# Patient Record
Sex: Male | Born: 1935 | Race: White | Hispanic: No | Smoking: Former smoker
Health system: Southern US, Community
[De-identification: ages and names within clinical notes are randomized; demographics above are authoritative.]

## PROBLEM LIST (undated history)

## (undated) DIAGNOSIS — M545 Low back pain, unspecified: Secondary | ICD-10-CM

## (undated) DIAGNOSIS — G308 Other Alzheimer's disease: Secondary | ICD-10-CM

## (undated) DIAGNOSIS — F339 Major depressive disorder, recurrent, unspecified: Secondary | ICD-10-CM

## (undated) DIAGNOSIS — I482 Chronic atrial fibrillation, unspecified: Secondary | ICD-10-CM

## (undated) DIAGNOSIS — C61 Malignant neoplasm of prostate: Secondary | ICD-10-CM

## (undated) DIAGNOSIS — F0280 Dementia in other diseases classified elsewhere without behavioral disturbance: Secondary | ICD-10-CM

## (undated) DIAGNOSIS — I639 Cerebral infarction, unspecified: Secondary | ICD-10-CM

## (undated) DIAGNOSIS — I10 Essential (primary) hypertension: Secondary | ICD-10-CM

## (undated) DIAGNOSIS — I2510 Atherosclerotic heart disease of native coronary artery without angina pectoris: Secondary | ICD-10-CM

## (undated) HISTORY — DX: Low back pain: M54.5

## (undated) HISTORY — PX: CATARACT EXTRACTION: SUR2

## (undated) HISTORY — PX: PROSTATECTOMY: SHX69

## (undated) HISTORY — PX: CORONARY ARTERY BYPASS GRAFT: SHX141

## (undated) HISTORY — DX: Low back pain, unspecified: M54.50

---

## 2011-07-15 DIAGNOSIS — M5137 Other intervertebral disc degeneration, lumbosacral region: Secondary | ICD-10-CM | POA: Diagnosis not present

## 2011-07-15 DIAGNOSIS — M999 Biomechanical lesion, unspecified: Secondary | ICD-10-CM | POA: Diagnosis not present

## 2011-07-15 DIAGNOSIS — M549 Dorsalgia, unspecified: Secondary | ICD-10-CM | POA: Diagnosis not present

## 2011-07-19 DIAGNOSIS — R071 Chest pain on breathing: Secondary | ICD-10-CM | POA: Diagnosis not present

## 2011-07-19 DIAGNOSIS — M25519 Pain in unspecified shoulder: Secondary | ICD-10-CM | POA: Diagnosis not present

## 2011-07-19 DIAGNOSIS — J310 Chronic rhinitis: Secondary | ICD-10-CM | POA: Diagnosis not present

## 2011-07-19 DIAGNOSIS — R0982 Postnasal drip: Secondary | ICD-10-CM | POA: Diagnosis not present

## 2011-07-19 DIAGNOSIS — Z7901 Long term (current) use of anticoagulants: Secondary | ICD-10-CM | POA: Diagnosis not present

## 2011-07-19 DIAGNOSIS — R197 Diarrhea, unspecified: Secondary | ICD-10-CM | POA: Diagnosis not present

## 2011-07-19 DIAGNOSIS — I4891 Unspecified atrial fibrillation: Secondary | ICD-10-CM | POA: Diagnosis not present

## 2011-07-19 DIAGNOSIS — J449 Chronic obstructive pulmonary disease, unspecified: Secondary | ICD-10-CM | POA: Diagnosis not present

## 2011-07-29 DIAGNOSIS — M5137 Other intervertebral disc degeneration, lumbosacral region: Secondary | ICD-10-CM | POA: Diagnosis not present

## 2011-07-29 DIAGNOSIS — M549 Dorsalgia, unspecified: Secondary | ICD-10-CM | POA: Diagnosis not present

## 2011-07-29 DIAGNOSIS — M999 Biomechanical lesion, unspecified: Secondary | ICD-10-CM | POA: Diagnosis not present

## 2011-08-03 DIAGNOSIS — M549 Dorsalgia, unspecified: Secondary | ICD-10-CM | POA: Diagnosis not present

## 2011-08-03 DIAGNOSIS — M999 Biomechanical lesion, unspecified: Secondary | ICD-10-CM | POA: Diagnosis not present

## 2011-08-03 DIAGNOSIS — M5137 Other intervertebral disc degeneration, lumbosacral region: Secondary | ICD-10-CM | POA: Diagnosis not present

## 2011-08-17 DIAGNOSIS — M5137 Other intervertebral disc degeneration, lumbosacral region: Secondary | ICD-10-CM | POA: Diagnosis not present

## 2011-08-17 DIAGNOSIS — M549 Dorsalgia, unspecified: Secondary | ICD-10-CM | POA: Diagnosis not present

## 2011-08-17 DIAGNOSIS — M999 Biomechanical lesion, unspecified: Secondary | ICD-10-CM | POA: Diagnosis not present

## 2011-08-24 DIAGNOSIS — Z7901 Long term (current) use of anticoagulants: Secondary | ICD-10-CM | POA: Diagnosis not present

## 2011-08-24 DIAGNOSIS — I472 Ventricular tachycardia: Secondary | ICD-10-CM | POA: Diagnosis not present

## 2011-08-24 DIAGNOSIS — F0280 Dementia in other diseases classified elsewhere without behavioral disturbance: Secondary | ICD-10-CM | POA: Diagnosis not present

## 2011-08-24 DIAGNOSIS — Z5181 Encounter for therapeutic drug level monitoring: Secondary | ICD-10-CM | POA: Diagnosis not present

## 2011-08-26 DIAGNOSIS — M999 Biomechanical lesion, unspecified: Secondary | ICD-10-CM | POA: Diagnosis not present

## 2011-08-26 DIAGNOSIS — M549 Dorsalgia, unspecified: Secondary | ICD-10-CM | POA: Diagnosis not present

## 2011-08-26 DIAGNOSIS — M5137 Other intervertebral disc degeneration, lumbosacral region: Secondary | ICD-10-CM | POA: Diagnosis not present

## 2011-09-02 DIAGNOSIS — M549 Dorsalgia, unspecified: Secondary | ICD-10-CM | POA: Diagnosis not present

## 2011-09-02 DIAGNOSIS — M5137 Other intervertebral disc degeneration, lumbosacral region: Secondary | ICD-10-CM | POA: Diagnosis not present

## 2011-09-02 DIAGNOSIS — M999 Biomechanical lesion, unspecified: Secondary | ICD-10-CM | POA: Diagnosis not present

## 2011-09-16 DIAGNOSIS — M549 Dorsalgia, unspecified: Secondary | ICD-10-CM | POA: Diagnosis not present

## 2011-09-16 DIAGNOSIS — M999 Biomechanical lesion, unspecified: Secondary | ICD-10-CM | POA: Diagnosis not present

## 2011-09-16 DIAGNOSIS — M5137 Other intervertebral disc degeneration, lumbosacral region: Secondary | ICD-10-CM | POA: Diagnosis not present

## 2011-09-25 DIAGNOSIS — I4891 Unspecified atrial fibrillation: Secondary | ICD-10-CM | POA: Diagnosis not present

## 2011-09-25 DIAGNOSIS — R0982 Postnasal drip: Secondary | ICD-10-CM | POA: Diagnosis not present

## 2011-09-25 DIAGNOSIS — J310 Chronic rhinitis: Secondary | ICD-10-CM | POA: Diagnosis not present

## 2011-09-25 DIAGNOSIS — F0280 Dementia in other diseases classified elsewhere without behavioral disturbance: Secondary | ICD-10-CM | POA: Diagnosis not present

## 2011-09-30 DIAGNOSIS — M5137 Other intervertebral disc degeneration, lumbosacral region: Secondary | ICD-10-CM | POA: Diagnosis not present

## 2011-09-30 DIAGNOSIS — M999 Biomechanical lesion, unspecified: Secondary | ICD-10-CM | POA: Diagnosis not present

## 2011-09-30 DIAGNOSIS — M549 Dorsalgia, unspecified: Secondary | ICD-10-CM | POA: Diagnosis not present

## 2011-10-07 DIAGNOSIS — M5137 Other intervertebral disc degeneration, lumbosacral region: Secondary | ICD-10-CM | POA: Diagnosis not present

## 2011-10-07 DIAGNOSIS — M549 Dorsalgia, unspecified: Secondary | ICD-10-CM | POA: Diagnosis not present

## 2011-10-07 DIAGNOSIS — M999 Biomechanical lesion, unspecified: Secondary | ICD-10-CM | POA: Diagnosis not present

## 2011-10-24 DIAGNOSIS — H348392 Tributary (branch) retinal vein occlusion, unspecified eye, stable: Secondary | ICD-10-CM | POA: Diagnosis not present

## 2011-10-26 DIAGNOSIS — H35039 Hypertensive retinopathy, unspecified eye: Secondary | ICD-10-CM | POA: Diagnosis not present

## 2011-10-26 DIAGNOSIS — H348392 Tributary (branch) retinal vein occlusion, unspecified eye, stable: Secondary | ICD-10-CM | POA: Diagnosis not present

## 2011-10-26 DIAGNOSIS — F0280 Dementia in other diseases classified elsewhere without behavioral disturbance: Secondary | ICD-10-CM | POA: Diagnosis not present

## 2011-10-26 DIAGNOSIS — I4891 Unspecified atrial fibrillation: Secondary | ICD-10-CM | POA: Diagnosis not present

## 2011-10-26 DIAGNOSIS — H3560 Retinal hemorrhage, unspecified eye: Secondary | ICD-10-CM | POA: Diagnosis not present

## 2011-11-15 DIAGNOSIS — I4891 Unspecified atrial fibrillation: Secondary | ICD-10-CM | POA: Diagnosis not present

## 2011-11-15 DIAGNOSIS — F0280 Dementia in other diseases classified elsewhere without behavioral disturbance: Secondary | ICD-10-CM | POA: Diagnosis not present

## 2011-11-15 DIAGNOSIS — G309 Alzheimer's disease, unspecified: Secondary | ICD-10-CM | POA: Diagnosis not present

## 2011-11-18 DIAGNOSIS — M5137 Other intervertebral disc degeneration, lumbosacral region: Secondary | ICD-10-CM | POA: Diagnosis not present

## 2011-11-18 DIAGNOSIS — M999 Biomechanical lesion, unspecified: Secondary | ICD-10-CM | POA: Diagnosis not present

## 2011-11-18 DIAGNOSIS — M549 Dorsalgia, unspecified: Secondary | ICD-10-CM | POA: Diagnosis not present

## 2011-11-25 DIAGNOSIS — M5137 Other intervertebral disc degeneration, lumbosacral region: Secondary | ICD-10-CM | POA: Diagnosis not present

## 2011-11-25 DIAGNOSIS — M549 Dorsalgia, unspecified: Secondary | ICD-10-CM | POA: Diagnosis not present

## 2011-11-25 DIAGNOSIS — M999 Biomechanical lesion, unspecified: Secondary | ICD-10-CM | POA: Diagnosis not present

## 2011-12-02 DIAGNOSIS — M549 Dorsalgia, unspecified: Secondary | ICD-10-CM | POA: Diagnosis not present

## 2011-12-02 DIAGNOSIS — M999 Biomechanical lesion, unspecified: Secondary | ICD-10-CM | POA: Diagnosis not present

## 2011-12-02 DIAGNOSIS — M5137 Other intervertebral disc degeneration, lumbosacral region: Secondary | ICD-10-CM | POA: Diagnosis not present

## 2011-12-09 DIAGNOSIS — M999 Biomechanical lesion, unspecified: Secondary | ICD-10-CM | POA: Diagnosis not present

## 2011-12-09 DIAGNOSIS — M549 Dorsalgia, unspecified: Secondary | ICD-10-CM | POA: Diagnosis not present

## 2011-12-09 DIAGNOSIS — M5137 Other intervertebral disc degeneration, lumbosacral region: Secondary | ICD-10-CM | POA: Diagnosis not present

## 2011-12-16 DIAGNOSIS — M999 Biomechanical lesion, unspecified: Secondary | ICD-10-CM | POA: Diagnosis not present

## 2011-12-16 DIAGNOSIS — M549 Dorsalgia, unspecified: Secondary | ICD-10-CM | POA: Diagnosis not present

## 2011-12-16 DIAGNOSIS — M5137 Other intervertebral disc degeneration, lumbosacral region: Secondary | ICD-10-CM | POA: Diagnosis not present

## 2011-12-23 DIAGNOSIS — M999 Biomechanical lesion, unspecified: Secondary | ICD-10-CM | POA: Diagnosis not present

## 2011-12-23 DIAGNOSIS — M5137 Other intervertebral disc degeneration, lumbosacral region: Secondary | ICD-10-CM | POA: Diagnosis not present

## 2011-12-23 DIAGNOSIS — M549 Dorsalgia, unspecified: Secondary | ICD-10-CM | POA: Diagnosis not present

## 2011-12-28 DIAGNOSIS — H3560 Retinal hemorrhage, unspecified eye: Secondary | ICD-10-CM | POA: Diagnosis not present

## 2011-12-28 DIAGNOSIS — H348392 Tributary (branch) retinal vein occlusion, unspecified eye, stable: Secondary | ICD-10-CM | POA: Diagnosis not present

## 2011-12-28 DIAGNOSIS — I4891 Unspecified atrial fibrillation: Secondary | ICD-10-CM | POA: Diagnosis not present

## 2011-12-28 DIAGNOSIS — F0280 Dementia in other diseases classified elsewhere without behavioral disturbance: Secondary | ICD-10-CM | POA: Diagnosis not present

## 2011-12-28 DIAGNOSIS — H35039 Hypertensive retinopathy, unspecified eye: Secondary | ICD-10-CM | POA: Diagnosis not present

## 2012-01-06 DIAGNOSIS — M5137 Other intervertebral disc degeneration, lumbosacral region: Secondary | ICD-10-CM | POA: Diagnosis not present

## 2012-01-06 DIAGNOSIS — M999 Biomechanical lesion, unspecified: Secondary | ICD-10-CM | POA: Diagnosis not present

## 2012-01-06 DIAGNOSIS — M549 Dorsalgia, unspecified: Secondary | ICD-10-CM | POA: Diagnosis not present

## 2012-01-16 DIAGNOSIS — M1610 Unilateral primary osteoarthritis, unspecified hip: Secondary | ICD-10-CM | POA: Diagnosis not present

## 2012-01-16 DIAGNOSIS — IMO0001 Reserved for inherently not codable concepts without codable children: Secondary | ICD-10-CM | POA: Diagnosis not present

## 2012-01-16 DIAGNOSIS — R5381 Other malaise: Secondary | ICD-10-CM | POA: Diagnosis not present

## 2012-01-16 DIAGNOSIS — N289 Disorder of kidney and ureter, unspecified: Secondary | ICD-10-CM | POA: Diagnosis not present

## 2012-01-27 DIAGNOSIS — M999 Biomechanical lesion, unspecified: Secondary | ICD-10-CM | POA: Diagnosis not present

## 2012-01-27 DIAGNOSIS — M549 Dorsalgia, unspecified: Secondary | ICD-10-CM | POA: Diagnosis not present

## 2012-01-27 DIAGNOSIS — M5137 Other intervertebral disc degeneration, lumbosacral region: Secondary | ICD-10-CM | POA: Diagnosis not present

## 2012-01-29 DIAGNOSIS — R5381 Other malaise: Secondary | ICD-10-CM | POA: Diagnosis not present

## 2012-01-29 DIAGNOSIS — N289 Disorder of kidney and ureter, unspecified: Secondary | ICD-10-CM | POA: Diagnosis not present

## 2012-01-29 DIAGNOSIS — M1610 Unilateral primary osteoarthritis, unspecified hip: Secondary | ICD-10-CM | POA: Diagnosis not present

## 2012-01-29 DIAGNOSIS — IMO0001 Reserved for inherently not codable concepts without codable children: Secondary | ICD-10-CM | POA: Diagnosis not present

## 2012-02-02 DIAGNOSIS — I4891 Unspecified atrial fibrillation: Secondary | ICD-10-CM | POA: Diagnosis not present

## 2012-02-02 DIAGNOSIS — F0280 Dementia in other diseases classified elsewhere without behavioral disturbance: Secondary | ICD-10-CM | POA: Diagnosis not present

## 2012-02-03 DIAGNOSIS — M999 Biomechanical lesion, unspecified: Secondary | ICD-10-CM | POA: Diagnosis not present

## 2012-02-03 DIAGNOSIS — M549 Dorsalgia, unspecified: Secondary | ICD-10-CM | POA: Diagnosis not present

## 2012-02-03 DIAGNOSIS — M5137 Other intervertebral disc degeneration, lumbosacral region: Secondary | ICD-10-CM | POA: Diagnosis not present

## 2012-02-06 DIAGNOSIS — M1610 Unilateral primary osteoarthritis, unspecified hip: Secondary | ICD-10-CM | POA: Diagnosis not present

## 2012-02-06 DIAGNOSIS — N289 Disorder of kidney and ureter, unspecified: Secondary | ICD-10-CM | POA: Diagnosis not present

## 2012-02-06 DIAGNOSIS — R5381 Other malaise: Secondary | ICD-10-CM | POA: Diagnosis not present

## 2012-02-06 DIAGNOSIS — IMO0001 Reserved for inherently not codable concepts without codable children: Secondary | ICD-10-CM | POA: Diagnosis not present

## 2012-02-16 DIAGNOSIS — J209 Acute bronchitis, unspecified: Secondary | ICD-10-CM | POA: Diagnosis not present

## 2012-02-16 DIAGNOSIS — F0390 Unspecified dementia without behavioral disturbance: Secondary | ICD-10-CM | POA: Diagnosis not present

## 2012-02-16 DIAGNOSIS — J310 Chronic rhinitis: Secondary | ICD-10-CM | POA: Diagnosis not present

## 2012-02-16 DIAGNOSIS — R05 Cough: Secondary | ICD-10-CM | POA: Diagnosis not present

## 2012-02-17 DIAGNOSIS — M549 Dorsalgia, unspecified: Secondary | ICD-10-CM | POA: Diagnosis not present

## 2012-02-17 DIAGNOSIS — M5137 Other intervertebral disc degeneration, lumbosacral region: Secondary | ICD-10-CM | POA: Diagnosis not present

## 2012-02-17 DIAGNOSIS — M999 Biomechanical lesion, unspecified: Secondary | ICD-10-CM | POA: Diagnosis not present

## 2012-02-19 DIAGNOSIS — M1610 Unilateral primary osteoarthritis, unspecified hip: Secondary | ICD-10-CM | POA: Diagnosis not present

## 2012-02-19 DIAGNOSIS — R5381 Other malaise: Secondary | ICD-10-CM | POA: Diagnosis not present

## 2012-02-19 DIAGNOSIS — N289 Disorder of kidney and ureter, unspecified: Secondary | ICD-10-CM | POA: Diagnosis not present

## 2012-02-19 DIAGNOSIS — R5383 Other fatigue: Secondary | ICD-10-CM | POA: Diagnosis not present

## 2012-02-19 DIAGNOSIS — IMO0001 Reserved for inherently not codable concepts without codable children: Secondary | ICD-10-CM | POA: Diagnosis not present

## 2012-02-24 DIAGNOSIS — M999 Biomechanical lesion, unspecified: Secondary | ICD-10-CM | POA: Diagnosis not present

## 2012-02-24 DIAGNOSIS — M5137 Other intervertebral disc degeneration, lumbosacral region: Secondary | ICD-10-CM | POA: Diagnosis not present

## 2012-02-24 DIAGNOSIS — M549 Dorsalgia, unspecified: Secondary | ICD-10-CM | POA: Diagnosis not present

## 2012-02-26 DIAGNOSIS — R5383 Other fatigue: Secondary | ICD-10-CM | POA: Diagnosis not present

## 2012-02-26 DIAGNOSIS — IMO0001 Reserved for inherently not codable concepts without codable children: Secondary | ICD-10-CM | POA: Diagnosis not present

## 2012-02-26 DIAGNOSIS — M1610 Unilateral primary osteoarthritis, unspecified hip: Secondary | ICD-10-CM | POA: Diagnosis not present

## 2012-02-26 DIAGNOSIS — N289 Disorder of kidney and ureter, unspecified: Secondary | ICD-10-CM | POA: Diagnosis not present

## 2012-02-28 DIAGNOSIS — R142 Eructation: Secondary | ICD-10-CM | POA: Diagnosis not present

## 2012-02-28 DIAGNOSIS — R0982 Postnasal drip: Secondary | ICD-10-CM | POA: Diagnosis not present

## 2012-02-28 DIAGNOSIS — R141 Gas pain: Secondary | ICD-10-CM | POA: Diagnosis not present

## 2012-02-28 DIAGNOSIS — Z7901 Long term (current) use of anticoagulants: Secondary | ICD-10-CM | POA: Diagnosis not present

## 2012-03-04 DIAGNOSIS — R5381 Other malaise: Secondary | ICD-10-CM | POA: Diagnosis not present

## 2012-03-04 DIAGNOSIS — IMO0001 Reserved for inherently not codable concepts without codable children: Secondary | ICD-10-CM | POA: Diagnosis not present

## 2012-03-04 DIAGNOSIS — R5383 Other fatigue: Secondary | ICD-10-CM | POA: Diagnosis not present

## 2012-03-04 DIAGNOSIS — N289 Disorder of kidney and ureter, unspecified: Secondary | ICD-10-CM | POA: Diagnosis not present

## 2012-03-04 DIAGNOSIS — M1610 Unilateral primary osteoarthritis, unspecified hip: Secondary | ICD-10-CM | POA: Diagnosis not present

## 2012-03-07 DIAGNOSIS — I4891 Unspecified atrial fibrillation: Secondary | ICD-10-CM | POA: Diagnosis not present

## 2012-03-07 DIAGNOSIS — F0280 Dementia in other diseases classified elsewhere without behavioral disturbance: Secondary | ICD-10-CM | POA: Diagnosis not present

## 2012-03-09 DIAGNOSIS — M549 Dorsalgia, unspecified: Secondary | ICD-10-CM | POA: Diagnosis not present

## 2012-03-09 DIAGNOSIS — M999 Biomechanical lesion, unspecified: Secondary | ICD-10-CM | POA: Diagnosis not present

## 2012-03-09 DIAGNOSIS — M5137 Other intervertebral disc degeneration, lumbosacral region: Secondary | ICD-10-CM | POA: Diagnosis not present

## 2012-04-03 DIAGNOSIS — Z23 Encounter for immunization: Secondary | ICD-10-CM | POA: Diagnosis not present

## 2012-04-03 DIAGNOSIS — F0390 Unspecified dementia without behavioral disturbance: Secondary | ICD-10-CM | POA: Diagnosis not present

## 2012-04-03 DIAGNOSIS — F0280 Dementia in other diseases classified elsewhere without behavioral disturbance: Secondary | ICD-10-CM | POA: Diagnosis not present

## 2012-04-03 DIAGNOSIS — G309 Alzheimer's disease, unspecified: Secondary | ICD-10-CM | POA: Diagnosis not present

## 2012-04-17 DIAGNOSIS — F0390 Unspecified dementia without behavioral disturbance: Secondary | ICD-10-CM | POA: Diagnosis not present

## 2012-04-17 DIAGNOSIS — M545 Low back pain: Secondary | ICD-10-CM | POA: Diagnosis not present

## 2012-04-17 DIAGNOSIS — R0982 Postnasal drip: Secondary | ICD-10-CM | POA: Diagnosis not present

## 2012-04-23 DIAGNOSIS — I4891 Unspecified atrial fibrillation: Secondary | ICD-10-CM | POA: Diagnosis not present

## 2012-04-23 DIAGNOSIS — F0280 Dementia in other diseases classified elsewhere without behavioral disturbance: Secondary | ICD-10-CM | POA: Diagnosis not present

## 2012-05-01 DIAGNOSIS — K219 Gastro-esophageal reflux disease without esophagitis: Secondary | ICD-10-CM | POA: Diagnosis not present

## 2012-05-01 DIAGNOSIS — M545 Low back pain: Secondary | ICD-10-CM | POA: Diagnosis not present

## 2012-05-01 DIAGNOSIS — Z7901 Long term (current) use of anticoagulants: Secondary | ICD-10-CM | POA: Diagnosis not present

## 2012-05-01 DIAGNOSIS — R0982 Postnasal drip: Secondary | ICD-10-CM | POA: Diagnosis not present

## 2012-05-01 DIAGNOSIS — J310 Chronic rhinitis: Secondary | ICD-10-CM | POA: Diagnosis not present

## 2012-05-01 DIAGNOSIS — F0390 Unspecified dementia without behavioral disturbance: Secondary | ICD-10-CM | POA: Diagnosis not present

## 2012-05-15 DIAGNOSIS — R0982 Postnasal drip: Secondary | ICD-10-CM | POA: Diagnosis not present

## 2012-05-15 DIAGNOSIS — J310 Chronic rhinitis: Secondary | ICD-10-CM | POA: Diagnosis not present

## 2012-05-15 DIAGNOSIS — F0280 Dementia in other diseases classified elsewhere without behavioral disturbance: Secondary | ICD-10-CM | POA: Diagnosis not present

## 2012-05-15 DIAGNOSIS — J709 Respiratory conditions due to unspecified external agent: Secondary | ICD-10-CM | POA: Diagnosis not present

## 2012-05-15 DIAGNOSIS — F0390 Unspecified dementia without behavioral disturbance: Secondary | ICD-10-CM | POA: Diagnosis not present

## 2012-05-15 DIAGNOSIS — G309 Alzheimer's disease, unspecified: Secondary | ICD-10-CM | POA: Diagnosis not present

## 2012-05-17 DIAGNOSIS — M5137 Other intervertebral disc degeneration, lumbosacral region: Secondary | ICD-10-CM | POA: Diagnosis not present

## 2012-05-17 DIAGNOSIS — M549 Dorsalgia, unspecified: Secondary | ICD-10-CM | POA: Diagnosis not present

## 2012-05-17 DIAGNOSIS — M999 Biomechanical lesion, unspecified: Secondary | ICD-10-CM | POA: Diagnosis not present

## 2012-05-30 DIAGNOSIS — I4891 Unspecified atrial fibrillation: Secondary | ICD-10-CM | POA: Diagnosis not present

## 2012-05-30 DIAGNOSIS — F0280 Dementia in other diseases classified elsewhere without behavioral disturbance: Secondary | ICD-10-CM | POA: Diagnosis not present

## 2012-06-05 DIAGNOSIS — J310 Chronic rhinitis: Secondary | ICD-10-CM | POA: Diagnosis not present

## 2012-06-05 DIAGNOSIS — J709 Respiratory conditions due to unspecified external agent: Secondary | ICD-10-CM | POA: Diagnosis not present

## 2012-06-05 DIAGNOSIS — G309 Alzheimer's disease, unspecified: Secondary | ICD-10-CM | POA: Diagnosis not present

## 2012-06-05 DIAGNOSIS — F0280 Dementia in other diseases classified elsewhere without behavioral disturbance: Secondary | ICD-10-CM | POA: Diagnosis not present

## 2012-06-05 DIAGNOSIS — I10 Essential (primary) hypertension: Secondary | ICD-10-CM | POA: Diagnosis not present

## 2012-06-05 DIAGNOSIS — R0982 Postnasal drip: Secondary | ICD-10-CM | POA: Diagnosis not present

## 2012-06-17 DIAGNOSIS — F0390 Unspecified dementia without behavioral disturbance: Secondary | ICD-10-CM | POA: Diagnosis not present

## 2012-06-17 DIAGNOSIS — F411 Generalized anxiety disorder: Secondary | ICD-10-CM | POA: Diagnosis not present

## 2012-06-17 DIAGNOSIS — J310 Chronic rhinitis: Secondary | ICD-10-CM | POA: Diagnosis not present

## 2012-06-17 DIAGNOSIS — G309 Alzheimer's disease, unspecified: Secondary | ICD-10-CM | POA: Diagnosis not present

## 2012-06-17 DIAGNOSIS — F0280 Dementia in other diseases classified elsewhere without behavioral disturbance: Secondary | ICD-10-CM | POA: Diagnosis not present

## 2012-06-17 DIAGNOSIS — R0982 Postnasal drip: Secondary | ICD-10-CM | POA: Diagnosis not present

## 2012-06-24 DIAGNOSIS — IMO0002 Reserved for concepts with insufficient information to code with codable children: Secondary | ICD-10-CM | POA: Diagnosis not present

## 2012-06-24 DIAGNOSIS — I10 Essential (primary) hypertension: Secondary | ICD-10-CM | POA: Diagnosis not present

## 2012-06-26 DIAGNOSIS — Z111 Encounter for screening for respiratory tuberculosis: Secondary | ICD-10-CM | POA: Diagnosis not present

## 2012-07-09 DIAGNOSIS — I4891 Unspecified atrial fibrillation: Secondary | ICD-10-CM | POA: Diagnosis not present

## 2012-07-29 DIAGNOSIS — Z710 Person encountering health services to consult on behalf of another person: Secondary | ICD-10-CM | POA: Diagnosis not present

## 2012-09-02 DIAGNOSIS — Z7901 Long term (current) use of anticoagulants: Secondary | ICD-10-CM | POA: Diagnosis not present

## 2012-09-02 DIAGNOSIS — IMO0002 Reserved for concepts with insufficient information to code with codable children: Secondary | ICD-10-CM | POA: Diagnosis not present

## 2012-09-02 DIAGNOSIS — R197 Diarrhea, unspecified: Secondary | ICD-10-CM | POA: Diagnosis not present

## 2012-10-15 DIAGNOSIS — I4891 Unspecified atrial fibrillation: Secondary | ICD-10-CM | POA: Diagnosis not present

## 2012-10-30 DIAGNOSIS — R262 Difficulty in walking, not elsewhere classified: Secondary | ICD-10-CM | POA: Diagnosis not present

## 2012-10-30 DIAGNOSIS — M25569 Pain in unspecified knee: Secondary | ICD-10-CM | POA: Diagnosis not present

## 2012-10-30 DIAGNOSIS — R269 Unspecified abnormalities of gait and mobility: Secondary | ICD-10-CM | POA: Diagnosis not present

## 2012-11-01 DIAGNOSIS — R269 Unspecified abnormalities of gait and mobility: Secondary | ICD-10-CM | POA: Diagnosis not present

## 2012-11-01 DIAGNOSIS — R262 Difficulty in walking, not elsewhere classified: Secondary | ICD-10-CM | POA: Diagnosis not present

## 2012-11-01 DIAGNOSIS — M25569 Pain in unspecified knee: Secondary | ICD-10-CM | POA: Diagnosis not present

## 2012-11-05 DIAGNOSIS — R262 Difficulty in walking, not elsewhere classified: Secondary | ICD-10-CM | POA: Diagnosis not present

## 2012-11-05 DIAGNOSIS — R269 Unspecified abnormalities of gait and mobility: Secondary | ICD-10-CM | POA: Diagnosis not present

## 2012-11-05 DIAGNOSIS — M25569 Pain in unspecified knee: Secondary | ICD-10-CM | POA: Diagnosis not present

## 2012-11-06 DIAGNOSIS — R262 Difficulty in walking, not elsewhere classified: Secondary | ICD-10-CM | POA: Diagnosis not present

## 2012-11-06 DIAGNOSIS — R269 Unspecified abnormalities of gait and mobility: Secondary | ICD-10-CM | POA: Diagnosis not present

## 2012-11-06 DIAGNOSIS — M25569 Pain in unspecified knee: Secondary | ICD-10-CM | POA: Diagnosis not present

## 2012-11-12 DIAGNOSIS — R262 Difficulty in walking, not elsewhere classified: Secondary | ICD-10-CM | POA: Diagnosis not present

## 2012-11-12 DIAGNOSIS — M25569 Pain in unspecified knee: Secondary | ICD-10-CM | POA: Diagnosis not present

## 2012-11-12 DIAGNOSIS — R269 Unspecified abnormalities of gait and mobility: Secondary | ICD-10-CM | POA: Diagnosis not present

## 2012-11-13 DIAGNOSIS — R262 Difficulty in walking, not elsewhere classified: Secondary | ICD-10-CM | POA: Diagnosis not present

## 2012-11-13 DIAGNOSIS — R269 Unspecified abnormalities of gait and mobility: Secondary | ICD-10-CM | POA: Diagnosis not present

## 2012-11-13 DIAGNOSIS — M25569 Pain in unspecified knee: Secondary | ICD-10-CM | POA: Diagnosis not present

## 2012-11-18 DIAGNOSIS — R269 Unspecified abnormalities of gait and mobility: Secondary | ICD-10-CM | POA: Diagnosis not present

## 2012-11-18 DIAGNOSIS — M25569 Pain in unspecified knee: Secondary | ICD-10-CM | POA: Diagnosis not present

## 2012-11-18 DIAGNOSIS — R262 Difficulty in walking, not elsewhere classified: Secondary | ICD-10-CM | POA: Diagnosis not present

## 2012-11-20 DIAGNOSIS — M25569 Pain in unspecified knee: Secondary | ICD-10-CM | POA: Diagnosis not present

## 2012-11-20 DIAGNOSIS — R262 Difficulty in walking, not elsewhere classified: Secondary | ICD-10-CM | POA: Diagnosis not present

## 2012-11-20 DIAGNOSIS — R269 Unspecified abnormalities of gait and mobility: Secondary | ICD-10-CM | POA: Diagnosis not present

## 2012-11-25 DIAGNOSIS — R269 Unspecified abnormalities of gait and mobility: Secondary | ICD-10-CM | POA: Diagnosis not present

## 2012-11-25 DIAGNOSIS — R262 Difficulty in walking, not elsewhere classified: Secondary | ICD-10-CM | POA: Diagnosis not present

## 2012-11-25 DIAGNOSIS — M25569 Pain in unspecified knee: Secondary | ICD-10-CM | POA: Diagnosis not present

## 2012-11-27 DIAGNOSIS — M25569 Pain in unspecified knee: Secondary | ICD-10-CM | POA: Diagnosis not present

## 2012-11-27 DIAGNOSIS — R269 Unspecified abnormalities of gait and mobility: Secondary | ICD-10-CM | POA: Diagnosis not present

## 2012-11-27 DIAGNOSIS — R262 Difficulty in walking, not elsewhere classified: Secondary | ICD-10-CM | POA: Diagnosis not present

## 2012-11-29 DIAGNOSIS — I4891 Unspecified atrial fibrillation: Secondary | ICD-10-CM | POA: Diagnosis not present

## 2012-11-29 DIAGNOSIS — M25569 Pain in unspecified knee: Secondary | ICD-10-CM | POA: Diagnosis not present

## 2012-11-29 DIAGNOSIS — R269 Unspecified abnormalities of gait and mobility: Secondary | ICD-10-CM | POA: Diagnosis not present

## 2012-11-29 DIAGNOSIS — R262 Difficulty in walking, not elsewhere classified: Secondary | ICD-10-CM | POA: Diagnosis not present

## 2012-11-29 DIAGNOSIS — Z79899 Other long term (current) drug therapy: Secondary | ICD-10-CM | POA: Diagnosis not present

## 2012-11-29 DIAGNOSIS — L570 Actinic keratosis: Secondary | ICD-10-CM | POA: Diagnosis not present

## 2012-12-03 DIAGNOSIS — R269 Unspecified abnormalities of gait and mobility: Secondary | ICD-10-CM | POA: Diagnosis not present

## 2012-12-03 DIAGNOSIS — R262 Difficulty in walking, not elsewhere classified: Secondary | ICD-10-CM | POA: Diagnosis not present

## 2012-12-03 DIAGNOSIS — M25569 Pain in unspecified knee: Secondary | ICD-10-CM | POA: Diagnosis not present

## 2012-12-04 DIAGNOSIS — R269 Unspecified abnormalities of gait and mobility: Secondary | ICD-10-CM | POA: Diagnosis not present

## 2012-12-04 DIAGNOSIS — R262 Difficulty in walking, not elsewhere classified: Secondary | ICD-10-CM | POA: Diagnosis not present

## 2012-12-04 DIAGNOSIS — M25569 Pain in unspecified knee: Secondary | ICD-10-CM | POA: Diagnosis not present

## 2012-12-10 DIAGNOSIS — M25569 Pain in unspecified knee: Secondary | ICD-10-CM | POA: Diagnosis not present

## 2012-12-10 DIAGNOSIS — R269 Unspecified abnormalities of gait and mobility: Secondary | ICD-10-CM | POA: Diagnosis not present

## 2012-12-10 DIAGNOSIS — R262 Difficulty in walking, not elsewhere classified: Secondary | ICD-10-CM | POA: Diagnosis not present

## 2012-12-11 DIAGNOSIS — R262 Difficulty in walking, not elsewhere classified: Secondary | ICD-10-CM | POA: Diagnosis not present

## 2012-12-11 DIAGNOSIS — M25569 Pain in unspecified knee: Secondary | ICD-10-CM | POA: Diagnosis not present

## 2012-12-11 DIAGNOSIS — R269 Unspecified abnormalities of gait and mobility: Secondary | ICD-10-CM | POA: Diagnosis not present

## 2012-12-18 DIAGNOSIS — R269 Unspecified abnormalities of gait and mobility: Secondary | ICD-10-CM | POA: Diagnosis not present

## 2012-12-18 DIAGNOSIS — R262 Difficulty in walking, not elsewhere classified: Secondary | ICD-10-CM | POA: Diagnosis not present

## 2012-12-18 DIAGNOSIS — M25569 Pain in unspecified knee: Secondary | ICD-10-CM | POA: Diagnosis not present

## 2013-03-13 DIAGNOSIS — Z23 Encounter for immunization: Secondary | ICD-10-CM | POA: Diagnosis not present

## 2013-03-13 DIAGNOSIS — R059 Cough, unspecified: Secondary | ICD-10-CM | POA: Diagnosis not present

## 2013-03-13 DIAGNOSIS — R05 Cough: Secondary | ICD-10-CM | POA: Diagnosis not present

## 2013-06-12 DIAGNOSIS — Z7901 Long term (current) use of anticoagulants: Secondary | ICD-10-CM | POA: Diagnosis not present

## 2013-06-12 DIAGNOSIS — C4490 Unspecified malignant neoplasm of skin, unspecified: Secondary | ICD-10-CM | POA: Diagnosis not present

## 2013-06-12 DIAGNOSIS — I10 Essential (primary) hypertension: Secondary | ICD-10-CM | POA: Diagnosis not present

## 2013-06-12 DIAGNOSIS — I4891 Unspecified atrial fibrillation: Secondary | ICD-10-CM | POA: Diagnosis not present

## 2013-07-28 DIAGNOSIS — L570 Actinic keratosis: Secondary | ICD-10-CM | POA: Diagnosis not present

## 2013-07-28 DIAGNOSIS — Z8673 Personal history of transient ischemic attack (TIA), and cerebral infarction without residual deficits: Secondary | ICD-10-CM | POA: Diagnosis not present

## 2013-07-28 DIAGNOSIS — D485 Neoplasm of uncertain behavior of skin: Secondary | ICD-10-CM | POA: Diagnosis not present

## 2013-07-28 DIAGNOSIS — C44319 Basal cell carcinoma of skin of other parts of face: Secondary | ICD-10-CM | POA: Diagnosis not present

## 2013-07-28 DIAGNOSIS — Z7901 Long term (current) use of anticoagulants: Secondary | ICD-10-CM | POA: Diagnosis not present

## 2013-08-25 DIAGNOSIS — Z7901 Long term (current) use of anticoagulants: Secondary | ICD-10-CM | POA: Diagnosis not present

## 2013-08-25 DIAGNOSIS — I4891 Unspecified atrial fibrillation: Secondary | ICD-10-CM | POA: Diagnosis not present

## 2013-09-08 DIAGNOSIS — R0789 Other chest pain: Secondary | ICD-10-CM | POA: Diagnosis not present

## 2013-09-08 DIAGNOSIS — I4891 Unspecified atrial fibrillation: Secondary | ICD-10-CM | POA: Diagnosis not present

## 2013-09-08 DIAGNOSIS — Z7901 Long term (current) use of anticoagulants: Secondary | ICD-10-CM | POA: Diagnosis not present

## 2013-09-22 DIAGNOSIS — K3189 Other diseases of stomach and duodenum: Secondary | ICD-10-CM | POA: Diagnosis not present

## 2013-09-22 DIAGNOSIS — I4891 Unspecified atrial fibrillation: Secondary | ICD-10-CM | POA: Diagnosis not present

## 2013-09-22 DIAGNOSIS — R1013 Epigastric pain: Secondary | ICD-10-CM | POA: Diagnosis not present

## 2013-09-22 DIAGNOSIS — Z7901 Long term (current) use of anticoagulants: Secondary | ICD-10-CM | POA: Diagnosis not present

## 2013-10-06 DIAGNOSIS — I4891 Unspecified atrial fibrillation: Secondary | ICD-10-CM | POA: Diagnosis not present

## 2013-10-06 DIAGNOSIS — Z7901 Long term (current) use of anticoagulants: Secondary | ICD-10-CM | POA: Diagnosis not present

## 2013-11-17 DIAGNOSIS — Z7901 Long term (current) use of anticoagulants: Secondary | ICD-10-CM | POA: Diagnosis not present

## 2013-11-17 DIAGNOSIS — I4891 Unspecified atrial fibrillation: Secondary | ICD-10-CM | POA: Diagnosis not present

## 2013-11-17 DIAGNOSIS — I10 Essential (primary) hypertension: Secondary | ICD-10-CM | POA: Diagnosis not present

## 2013-12-25 DIAGNOSIS — I4891 Unspecified atrial fibrillation: Secondary | ICD-10-CM | POA: Diagnosis not present

## 2013-12-25 DIAGNOSIS — Z7901 Long term (current) use of anticoagulants: Secondary | ICD-10-CM | POA: Diagnosis not present

## 2013-12-25 DIAGNOSIS — I10 Essential (primary) hypertension: Secondary | ICD-10-CM | POA: Diagnosis not present

## 2013-12-25 DIAGNOSIS — Z23 Encounter for immunization: Secondary | ICD-10-CM | POA: Diagnosis not present

## 2014-01-29 DIAGNOSIS — Z7901 Long term (current) use of anticoagulants: Secondary | ICD-10-CM | POA: Diagnosis not present

## 2014-02-03 DIAGNOSIS — Z7901 Long term (current) use of anticoagulants: Secondary | ICD-10-CM | POA: Diagnosis not present

## 2014-02-03 DIAGNOSIS — I4891 Unspecified atrial fibrillation: Secondary | ICD-10-CM | POA: Diagnosis not present

## 2014-02-04 DIAGNOSIS — R262 Difficulty in walking, not elsewhere classified: Secondary | ICD-10-CM | POA: Diagnosis not present

## 2014-02-04 DIAGNOSIS — M6281 Muscle weakness (generalized): Secondary | ICD-10-CM | POA: Diagnosis not present

## 2014-02-06 DIAGNOSIS — R262 Difficulty in walking, not elsewhere classified: Secondary | ICD-10-CM | POA: Diagnosis not present

## 2014-02-06 DIAGNOSIS — M6281 Muscle weakness (generalized): Secondary | ICD-10-CM | POA: Diagnosis not present

## 2014-02-10 DIAGNOSIS — R262 Difficulty in walking, not elsewhere classified: Secondary | ICD-10-CM | POA: Diagnosis not present

## 2014-02-10 DIAGNOSIS — M6281 Muscle weakness (generalized): Secondary | ICD-10-CM | POA: Diagnosis not present

## 2014-02-11 DIAGNOSIS — R262 Difficulty in walking, not elsewhere classified: Secondary | ICD-10-CM | POA: Diagnosis not present

## 2014-02-11 DIAGNOSIS — M6281 Muscle weakness (generalized): Secondary | ICD-10-CM | POA: Diagnosis not present

## 2014-02-13 DIAGNOSIS — R262 Difficulty in walking, not elsewhere classified: Secondary | ICD-10-CM | POA: Diagnosis not present

## 2014-02-13 DIAGNOSIS — M6281 Muscle weakness (generalized): Secondary | ICD-10-CM | POA: Diagnosis not present

## 2014-02-16 DIAGNOSIS — M6281 Muscle weakness (generalized): Secondary | ICD-10-CM | POA: Diagnosis not present

## 2014-02-16 DIAGNOSIS — R262 Difficulty in walking, not elsewhere classified: Secondary | ICD-10-CM | POA: Diagnosis not present

## 2014-02-18 DIAGNOSIS — R262 Difficulty in walking, not elsewhere classified: Secondary | ICD-10-CM | POA: Diagnosis not present

## 2014-02-18 DIAGNOSIS — M6281 Muscle weakness (generalized): Secondary | ICD-10-CM | POA: Diagnosis not present

## 2014-02-20 DIAGNOSIS — R262 Difficulty in walking, not elsewhere classified: Secondary | ICD-10-CM | POA: Diagnosis not present

## 2014-02-20 DIAGNOSIS — M6281 Muscle weakness (generalized): Secondary | ICD-10-CM | POA: Diagnosis not present

## 2014-02-24 DIAGNOSIS — R262 Difficulty in walking, not elsewhere classified: Secondary | ICD-10-CM | POA: Diagnosis not present

## 2014-02-24 DIAGNOSIS — M6281 Muscle weakness (generalized): Secondary | ICD-10-CM | POA: Diagnosis not present

## 2014-02-25 DIAGNOSIS — M6281 Muscle weakness (generalized): Secondary | ICD-10-CM | POA: Diagnosis not present

## 2014-02-25 DIAGNOSIS — R262 Difficulty in walking, not elsewhere classified: Secondary | ICD-10-CM | POA: Diagnosis not present

## 2014-02-27 DIAGNOSIS — M6281 Muscle weakness (generalized): Secondary | ICD-10-CM | POA: Diagnosis not present

## 2014-02-27 DIAGNOSIS — R262 Difficulty in walking, not elsewhere classified: Secondary | ICD-10-CM | POA: Diagnosis not present

## 2014-03-03 DIAGNOSIS — R262 Difficulty in walking, not elsewhere classified: Secondary | ICD-10-CM | POA: Diagnosis not present

## 2014-03-03 DIAGNOSIS — Z7901 Long term (current) use of anticoagulants: Secondary | ICD-10-CM | POA: Diagnosis not present

## 2014-03-03 DIAGNOSIS — M6281 Muscle weakness (generalized): Secondary | ICD-10-CM | POA: Diagnosis not present

## 2014-03-06 DIAGNOSIS — R262 Difficulty in walking, not elsewhere classified: Secondary | ICD-10-CM | POA: Diagnosis not present

## 2014-03-06 DIAGNOSIS — M6281 Muscle weakness (generalized): Secondary | ICD-10-CM | POA: Diagnosis not present

## 2014-03-10 DIAGNOSIS — M6281 Muscle weakness (generalized): Secondary | ICD-10-CM | POA: Diagnosis not present

## 2014-03-10 DIAGNOSIS — R262 Difficulty in walking, not elsewhere classified: Secondary | ICD-10-CM | POA: Diagnosis not present

## 2014-03-13 DIAGNOSIS — R262 Difficulty in walking, not elsewhere classified: Secondary | ICD-10-CM | POA: Diagnosis not present

## 2014-03-13 DIAGNOSIS — M6281 Muscle weakness (generalized): Secondary | ICD-10-CM | POA: Diagnosis not present

## 2014-03-17 DIAGNOSIS — M6281 Muscle weakness (generalized): Secondary | ICD-10-CM | POA: Diagnosis not present

## 2014-03-17 DIAGNOSIS — R262 Difficulty in walking, not elsewhere classified: Secondary | ICD-10-CM | POA: Diagnosis not present

## 2014-03-18 DIAGNOSIS — M6281 Muscle weakness (generalized): Secondary | ICD-10-CM | POA: Diagnosis not present

## 2014-03-18 DIAGNOSIS — R262 Difficulty in walking, not elsewhere classified: Secondary | ICD-10-CM | POA: Diagnosis not present

## 2014-03-19 DIAGNOSIS — Z7901 Long term (current) use of anticoagulants: Secondary | ICD-10-CM | POA: Diagnosis not present

## 2014-03-19 DIAGNOSIS — I4891 Unspecified atrial fibrillation: Secondary | ICD-10-CM | POA: Diagnosis not present

## 2014-03-19 DIAGNOSIS — K625 Hemorrhage of anus and rectum: Secondary | ICD-10-CM | POA: Diagnosis not present

## 2014-03-19 DIAGNOSIS — J309 Allergic rhinitis, unspecified: Secondary | ICD-10-CM | POA: Diagnosis not present

## 2014-03-19 DIAGNOSIS — M216X9 Other acquired deformities of unspecified foot: Secondary | ICD-10-CM | POA: Diagnosis not present

## 2014-03-19 DIAGNOSIS — Z23 Encounter for immunization: Secondary | ICD-10-CM | POA: Diagnosis not present

## 2014-03-20 DIAGNOSIS — M6281 Muscle weakness (generalized): Secondary | ICD-10-CM | POA: Diagnosis not present

## 2014-03-20 DIAGNOSIS — R262 Difficulty in walking, not elsewhere classified: Secondary | ICD-10-CM | POA: Diagnosis not present

## 2014-03-24 DIAGNOSIS — M6281 Muscle weakness (generalized): Secondary | ICD-10-CM | POA: Diagnosis not present

## 2014-03-24 DIAGNOSIS — R262 Difficulty in walking, not elsewhere classified: Secondary | ICD-10-CM | POA: Diagnosis not present

## 2014-03-31 ENCOUNTER — Encounter: Payer: Self-pay | Admitting: Neurology

## 2014-03-31 ENCOUNTER — Encounter (INDEPENDENT_AMBULATORY_CARE_PROVIDER_SITE_OTHER): Payer: Self-pay

## 2014-03-31 ENCOUNTER — Ambulatory Visit (INDEPENDENT_AMBULATORY_CARE_PROVIDER_SITE_OTHER): Payer: Medicare Other | Admitting: Neurology

## 2014-03-31 VITALS — BP 195/111 | HR 73 | Ht 71.0 in | Wt 200.0 lb

## 2014-03-31 DIAGNOSIS — M545 Low back pain, unspecified: Secondary | ICD-10-CM | POA: Insufficient documentation

## 2014-03-31 DIAGNOSIS — Z7901 Long term (current) use of anticoagulants: Secondary | ICD-10-CM | POA: Diagnosis not present

## 2014-03-31 DIAGNOSIS — I10 Essential (primary) hypertension: Secondary | ICD-10-CM | POA: Insufficient documentation

## 2014-03-31 DIAGNOSIS — R269 Unspecified abnormalities of gait and mobility: Secondary | ICD-10-CM | POA: Diagnosis not present

## 2014-03-31 DIAGNOSIS — M5430 Sciatica, unspecified side: Secondary | ICD-10-CM

## 2014-03-31 DIAGNOSIS — M5440 Lumbago with sciatica, unspecified side: Secondary | ICD-10-CM

## 2014-03-31 NOTE — Progress Notes (Signed)
PATIENT: Kurt Thomas DOB: 1919/07/02  HISTORICAL  Kurt Thomas is a 80 years old right-handed Caucasian male, accompanied by his son-in-law, referred by his primary care physician Dr. Melinda Crutch for evaluation of gait difficulty, left foot drop  He had past medical history of atrial fibrillation, taking chronic Coumadin, hypertension, mild memory loss, moved from [place] to current assisted living in 2013  He also reported a history of stroke more than 30 years ago, involving left arm, and leg, he has mild gait difficulty, but recovered very quickly, he had been using a cane since 2005, he fell about a month ago in August 20/15, at the bathroom, his left knee folded underneath him, ever since the event, he noticed increased gait difficulty, increased left knee pain,  He also has a history of chronic low back pain, radiating pain to bilateral hip region, 3 years history of urinary urgency, incontinence. He also complains of bilateral foot numbness, more so on the left median ankle,  REVIEW OF SYSTEMS: Full 14 system review of systems performed and notable only for numbness, low back pain,  ALLERGIES: Allergies not on file  HOME MEDICATIONS: No current outpatient prescriptions on file prior to visit.   No current facility-administered medications on file prior to visit.    PAST MEDICAL HISTORY: No past medical history on file.  PAST SURGICAL HISTORY: No past surgical history on file.  FAMILY HISTORY: No family history on file.  SOCIAL HISTORY:  History   Social History  . Marital Status: Married    Spouse Name: N/A    Number of Children: N/A  . Years of Education: N/A   Occupational History  . Not on file.   Social History Main Topics  . Smoking status: Not on file  . Smokeless tobacco: Not on file  . Alcohol Use: Not on file  . Drug Use: Not on file  . Sexual Activity: Not on file   Other Topics Concern  . Not on file   Social History Narrative  . No narrative  on file     PHYSICAL EXAM   There were no vitals filed for this visit.  Not recorded    There is no height or weight on file to calculate BMI.   Generalized: In no acute distress  Neck: Supple, no carotid bruits   Cardiac: Regular rate rhythm  Pulmonary: Clear to auscultation bilaterally  Musculoskeletal: No deformity  Neurological examination  Mentation: Alert oriented to time, place, history taking, and causual conversation  Cranial nerve II-XII: Pupils were equal round reactive to light. Extraocular movements were full.  Visual field were full on confrontational test. Bilateral fundi were sharp.  Facial sensation and strength were normal. Hearing was intact to finger rubbing bilaterally. Uvula tongue midline.  Head turning and shoulder shrug and were normal and symmetric.Tongue protrusion into cheek strength was normal.  Motor: Mild fixation of the left arm upon rapid rotating movement, bilateral ankle dorsi flexion weakness, left side is moderate, right side is mild  Sensory: Intact to fine touch, pinprick, preserved vibratory sensation, and proprioception at toes.  Coordination: Normal finger to nose, heel-to-shin bilaterally there was no truncal ataxia  Gait: He needs to use his cane to get up from seated position, cautious, flailed gait, more so on the left side  Deep tendon reflexes: Brachioradialis 2/2, biceps 2/2, triceps 2/2, patellar 2/2, Achilles absent, plantar responses were flexor bilaterally.   DIAGNOSTIC DATA (LABS, IMAGING, TESTING) - I reviewed patient records, labs, notes, testing  and imaging myself where available.   ASSESSMENT AND PLAN  [name] is a 80 y.o. male with history of chronic low back pain, radiating pain to bilateral hip, now presenting with worsening gait difficulty, distal leg weakness, left worse than right, urinary incontinence, most consistent with lumbar stenosis, radiculopathies  1. Complete evaluation with MRI of lumbar  2.  EMG nerve conduction study  3. Continue physical therapy   Marcial Pacas, M.D. Ph.D.  [facility] [address] [place], [place] [address] [phone]

## 2014-04-06 DIAGNOSIS — I4891 Unspecified atrial fibrillation: Secondary | ICD-10-CM | POA: Diagnosis not present

## 2014-04-08 ENCOUNTER — Telehealth: Payer: Self-pay | Admitting: Neurology

## 2014-04-08 ENCOUNTER — Ambulatory Visit (INDEPENDENT_AMBULATORY_CARE_PROVIDER_SITE_OTHER): Payer: Medicare Other | Admitting: Neurology

## 2014-04-08 ENCOUNTER — Encounter (INDEPENDENT_AMBULATORY_CARE_PROVIDER_SITE_OTHER): Payer: Medicare Other | Admitting: Radiology

## 2014-04-08 DIAGNOSIS — M5440 Lumbago with sciatica, unspecified side: Secondary | ICD-10-CM

## 2014-04-08 DIAGNOSIS — M5430 Sciatica, unspecified side: Secondary | ICD-10-CM

## 2014-04-08 DIAGNOSIS — Z0289 Encounter for other administrative examinations: Secondary | ICD-10-CM

## 2014-04-08 DIAGNOSIS — R269 Unspecified abnormalities of gait and mobility: Secondary | ICD-10-CM

## 2014-04-08 NOTE — Telephone Encounter (Signed)
°  Patient has received his MRI date which is 04/17/2014 and he needs an Rx for claustrophobia. Best call back is [phone] -

## 2014-04-08 NOTE — Procedures (Signed)
   NCS (NERVE CONDUCTION STUDY) WITH EMG (ELECTROMYOGRAPHY) REPORT   STUDY DATE: Sept 30 2015 PATIENT NAME: Kurt Thomas DOB: March 17, 1919 MRN: [id number]    TECHNOLOGIST: Towana Badger ELECTROMYOGRAPHER: Marcial Pacas M.D.  CLINICAL INFORMATION:   80 years old gentleman, with gradually onset gait difficulty, also history of chronic low back pain, left foot drop  FINDINGS: NERVE CONDUCTION STUDY: Bilateral sural, peroneal sensory responses were absent. Left peroneal motor responses was normal. Right peroneal motor responses showed slightly decreased CMAC amplitude at proximal stimulation side, was preserved C. map amplitude at distal stimulation side. Left tibial motor responses were normal. Right tibial motor responses showed mild to moderately decreased CMAC amplitude.   Bilateral tibial H. reflexes were normal and symmetric.  Left median, ulnar sensory responses showed moderately decreased snap amplitude. Left median, ulnar motor responses were normal  NEEDLE ELECTROMYOGRAPHY: Selected needle examination was performed at bilateral lower extremity muscles, bilateral lumbar paraspinal muscles.  Left tibialis anterior: Increased insertion activity, 1-2 plus spontaneous activity, complex enlarged motor unit potential, with decreased recruitment patterns.  Left peroneal longus: Increased insertion activity, no spontaneous activity, complex enlarged motor unit potential with decreased recruitment patterns.  Right tibialis anterior: Normally insertion activity, no spontaneous activity, mild enlarged simple morphology motor unit potential, with mildly decreased recruitment patterns.  Right peroneal longus, normally insertion activity, no spontaneous activity, simple morphology enlarged motor unit potential with mildly decreased recruitment patterns.  Bilateral vastus lateralis: Normally insertion activity, no spontaneous activity, simple morphology enlarged motor unit potential, with decreased  recruitment patterns  Right biceps femoris short head, left biceps femoris long head, normally insertion activity, no spontaneous activity, enlarged motor unit potential, with decreased recruitment patterns.  There was increased insertion activity at bilateral lumbar paraspinal muscles, complex enlarged motor unit potential, at bilateral L4-5 S1. I do not see any spontaneous activity, he has poor muscle relaxation.  IMPRESSION:   This is an abnormal study. 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 In addition, there is also evidence of chronic neuropathic changes involving bilateral L4, L5, S1 myotomes, left worse than right, suggestive of bilateral lumbosacral radiculopathies. MRI of lumbar spine is pending.   INTERPRETING PHYSICIAN:   Marcial Pacas M.D. Ph.D. [facility] [address] [place], [place] [address] [phone]

## 2014-04-09 ENCOUNTER — Encounter: Payer: Medicare Other | Admitting: Neurology

## 2014-04-09 NOTE — Telephone Encounter (Signed)
Kurt Thomas, please call him and give a package of xanax before MRI

## 2014-04-09 NOTE — Telephone Encounter (Signed)
Patient would like a sedative prescribed to take prior to MRI.  Please advise.  Thank you.

## 2014-04-10 NOTE — Telephone Encounter (Signed)
Dr.Yan you will have to get please call me when available I will call patient to pick up.

## 2014-04-14 NOTE — Telephone Encounter (Signed)
Kurt Thomas, I have medication ready, please call patient to pick it up, he needs to take it 30 minutes before MRI.

## 2014-04-15 NOTE — Telephone Encounter (Signed)
Spoke to daughter rx done and she will be driving him to MRI.

## 2014-04-16 ENCOUNTER — Telehealth: Payer: Self-pay | Admitting: Neurology

## 2014-04-16 DIAGNOSIS — L570 Actinic keratosis: Secondary | ICD-10-CM | POA: Diagnosis not present

## 2014-04-16 NOTE — Telephone Encounter (Signed)
Patient's power of attorney calling to state that patient has an MRI scheduled for tomorrow, patient is claustrophobic and he is requesting that a sedative be called in to [facility] on [address]. Please return call and advise.

## 2014-04-16 NOTE — Telephone Encounter (Signed)
Explained to patient Xanax was at the front and he had to have a driver.

## 2014-04-17 ENCOUNTER — Ambulatory Visit
Admission: RE | Admit: 2014-04-17 | Discharge: 2014-04-17 | Disposition: A | Discharge status: Home / Self Care | Payer: Medicare Other | Source: Ambulatory Visit | Attending: Neurology | Admitting: Neurology

## 2014-04-17 DIAGNOSIS — M549 Dorsalgia, unspecified: Secondary | ICD-10-CM

## 2014-04-17 DIAGNOSIS — M5440 Lumbago with sciatica, unspecified side: Secondary | ICD-10-CM | POA: Diagnosis not present

## 2014-04-17 DIAGNOSIS — R269 Unspecified abnormalities of gait and mobility: Secondary | ICD-10-CM

## 2014-04-21 ENCOUNTER — Telehealth: Payer: Self-pay | Admitting: Neurology

## 2014-04-21 NOTE — Telephone Encounter (Signed)
I have left the message to her daughter, significant abnormalities on MRI lumbar showed lumbar stenosis. Will go over detail on follow up visit in Oct 29th

## 2014-04-23 ENCOUNTER — Ambulatory Visit: Payer: Medicare Other | Admitting: Neurology

## 2014-05-07 ENCOUNTER — Ambulatory Visit (INDEPENDENT_AMBULATORY_CARE_PROVIDER_SITE_OTHER): Payer: Medicare Other | Admitting: Neurology

## 2014-05-07 ENCOUNTER — Encounter: Payer: Self-pay | Admitting: Neurology

## 2014-05-07 VITALS — BP 183/107 | HR 85 | Ht 71.0 in | Wt 200.0 lb

## 2014-05-07 DIAGNOSIS — M4806 Spinal stenosis, lumbar region: Secondary | ICD-10-CM | POA: Diagnosis not present

## 2014-05-07 DIAGNOSIS — M48061 Spinal stenosis, lumbar region without neurogenic claudication: Secondary | ICD-10-CM

## 2014-05-07 MED ORDER — DULOXETINE HCL 20 MG PO CPEP: 20.0000 mg | ORAL_CAPSULE | Freq: Every day | ORAL | Status: DC

## 2014-05-07 NOTE — Progress Notes (Signed)
PATIENT: Kurt Thomas DOB: [birth year]  HISTORICAL  Kurt Thomas is a 80 years old right-handed Caucasian male, accompanied by his son-in-law, referred by his primary care physician Dr. Melinda Crutch for evaluation of gait difficulty, left foot drop  He had past medical history of atrial fibrillation, taking chronic Coumadin, hypertension, mild memory loss, moved from [place] to current assisted living in 2013  He also reported a history of stroke more than 30 years ago, involving left arm, and leg, he has mild gait difficulty, but recovered very quickly, he had been using a cane since 2005, he fell about a month ago in August 20/15, at the bathroom, his left knee folded underneath him, ever since the event, he noticed increased gait difficulty, increased left knee pain,  He also has a history of chronic low back pain, radiating pain to bilateral hip region, 3 years history of urinary urgency, incontinence. He also complains of bilateral foot numbness, more so on the left median ankle,  Update May 07 2014  He complains of worsening low back pain, radiating pain to bilateral hip, worsening gait difficulty, worsening urinary incontinence, constipations,    EMG nerve conduction study showed mild length  dependent axonal peripheral neuropathy. In addition, there is also evidence of chronic neuropathic changes involving bilateral L4, L5, S1 myotomes, left worse than right, suggestive of bilateral lumbosacral radiculopathies.   We have reviewed MRI of lumbar spine prominent disc and facet degenerative changes throughout most noticeable at L3-4 and L4-5 where there is marked bilateral foraminal narrowing and posterior canal narrowing with likely involvement of both L3 and L4 nerve roots bilaterally   REVIEW OF SYSTEMS: Full 14 system review of systems performed and notable only for numbness, low back pain,Urinary incontinence,  ALLERGIES: No Known Allergies  HOME MEDICATIONS: Current  Outpatient Prescriptions on File Prior to Visit  Medication Sig Dispense Refill  . albuterol (PROVENTIL) (2.5 MG/3ML) 0.083% nebulizer solution Take 2.5 mg by nebulization every 6 (six) hours as needed for wheezing or shortness of breath.      . carvedilol (COREG) 12.5 MG tablet Take 12.5 mg by mouth 2 (two) times daily with a meal.      . donepezil (ARICEPT) 10 MG tablet Take 10 mg by mouth at bedtime.      . enalapril (VASOTEC) 20 MG tablet Take 20 mg by mouth daily.      . fluticasone (FLONASE) 50 MCG/ACT nasal spray Place into both nostrils daily.      . perindopril (ACEON) 2 MG tablet Take 2 mg by mouth daily.      . polyethylene glycol (MIRALAX / GLYCOLAX) packet Take 17 g by mouth daily.      . ranitidine (ZANTAC) 150 MG capsule Take 150 mg by mouth 2 (two) times daily.      . sertraline (ZOLOFT) 25 MG tablet 25 mg daily.      . sodium chloride (OCEAN) 0.65 % SOLN nasal spray Place 1 spray into both nostrils as needed for congestion.      Marland Kitchen warfarin (COUMADIN) 5 MG tablet Take 5 mg by mouth daily.       No current facility-administered medications on file prior to visit.    PAST MEDICAL HISTORY: Past Medical History  Diagnosis Date  . High blood pressure   . Memory loss   . Low back pain     PAST SURGICAL HISTORY: Past Surgical History  Procedure Laterality Date  . None      FAMILY HISTORY:  Family History  Problem Relation Age of Onset  . High blood pressure      SOCIAL HISTORY:  History   Social History  . Marital Status: Married    Spouse Name: N/A    Number of Children: 1  . Years of Education: college   Occupational History  .      Retired   Social History Main Topics  . Smoking status: Never Smoker   . Smokeless tobacco: Never Used  . Alcohol Use: No  . Drug Use: No  . Sexual Activity: Not on file   Other Topics Concern  . Not on file   Social History Narrative   Patient is retired and Lives in [place]    Education college    Right  handed.     PHYSICAL EXAM   Filed Vitals:   05/07/14 1348  BP: 183/107  Pulse: 85  Height: 5\' 11"  (1.803 m)  Weight: 200 lb (90.719 kg)    Not recorded    Body mass index is 27.91 kg/(m^2).   Generalized: In no acute distress  Neck: Supple, no carotid bruits   Cardiac: Regular rate rhythm  Pulmonary: Clear to auscultation bilaterally  Musculoskeletal: No deformity  Neurological examination  Mentation: Alert oriented to time, place, history taking, and causual conversation  Cranial nerve II-XII: Pupils were equal round reactive to light. Extraocular movements were full.  Visual field were full on confrontational test. Bilateral fundi were sharp.  Facial sensation and strength were normal. Hearing was intact to finger rubbing bilaterally. Uvula tongue midline.  Head turning and shoulder shrug and were normal and symmetric.Tongue protrusion into cheek strength was normal.  Motor: Mild fixation of the left arm upon rapid rotating movement, bilateral ankle dorsi flexion weakness, left side is moderate, right side is mild  Sensory: Intact to fine touch, pinprick, preserved vibratory sensation, and proprioception at toes.  Coordination: Normal finger to nose, heel-to-shin bilaterally there was no truncal ataxia  Gait: He needs to use his cane to get up from seated position, cautious, flailed gait, more so on the left side  Deep tendon reflexes: Brachioradialis 2/2, biceps 2/2, triceps 2/2, patellar 2/2, Achilles absent, plantar responses were flexor bilaterally.   DIAGNOSTIC DATA (LABS, IMAGING, TESTING) - I reviewed patient records, labs, notes, testing and imaging myself where available.   ASSESSMENT AND PLAN  Kurt Thomas is a 80 y.o. male with history of chronic low back pain, radiating pain to bilateral hip, now presenting with worsening gait difficulty, distal leg weakness, left worse than right, urinary incontinence, most consistent with lumbar stenosis,  radiculopathies, MRI lumbar spine showed prominent disc and facet degenerative changes throughout most noticeable at L3-4 and L4-5 where there is marked bilateral foraminal narrowing and posterior canal narrowing with likely involvement of both L3 and L4 nerve roots bilaterally   Patient is very functional for his age, has slow progressive worsening gait difficulty, urinary incontinence, constipations,  After discussed with patient and his son in  law, will refer him to neurosurgeon for second opinion, potential decompression surgery  He will return to clinic if needed      Marcial Pacas, M.D. Ph.D.  [facility] [address] [place], [place] [address] [phone]

## 2014-05-13 DIAGNOSIS — M4806 Spinal stenosis, lumbar region: Secondary | ICD-10-CM | POA: Diagnosis not present

## 2014-05-13 DIAGNOSIS — Z6827 Body mass index (BMI) 27.0-27.9, adult: Secondary | ICD-10-CM | POA: Diagnosis not present

## 2014-05-14 DIAGNOSIS — R300 Dysuria: Secondary | ICD-10-CM | POA: Diagnosis not present

## 2014-05-14 DIAGNOSIS — I482 Chronic atrial fibrillation: Secondary | ICD-10-CM | POA: Diagnosis not present

## 2014-05-14 DIAGNOSIS — L729 Follicular cyst of the skin and subcutaneous tissue, unspecified: Secondary | ICD-10-CM | POA: Diagnosis not present

## 2014-05-14 DIAGNOSIS — Z7901 Long term (current) use of anticoagulants: Secondary | ICD-10-CM | POA: Diagnosis not present

## 2014-05-15 DIAGNOSIS — I482 Chronic atrial fibrillation: Secondary | ICD-10-CM | POA: Diagnosis not present

## 2014-05-15 DIAGNOSIS — R300 Dysuria: Secondary | ICD-10-CM | POA: Diagnosis not present

## 2014-05-15 DIAGNOSIS — Z7901 Long term (current) use of anticoagulants: Secondary | ICD-10-CM | POA: Diagnosis not present

## 2014-05-15 DIAGNOSIS — L729 Follicular cyst of the skin and subcutaneous tissue, unspecified: Secondary | ICD-10-CM | POA: Diagnosis not present

## 2014-06-12 ENCOUNTER — Encounter (HOSPITAL_COMMUNITY): Payer: Self-pay | Admitting: Emergency Medicine

## 2014-06-12 ENCOUNTER — Emergency Department (HOSPITAL_COMMUNITY): Payer: Medicare Other

## 2014-06-12 ENCOUNTER — Emergency Department (HOSPITAL_COMMUNITY)
Admission: EM | Admit: 2014-06-12 | Discharge: 2014-06-12 | Disposition: A | Discharge status: Home / Self Care | Payer: Medicare Other | Attending: Emergency Medicine | Admitting: Emergency Medicine

## 2014-06-12 DIAGNOSIS — Z7951 Long term (current) use of inhaled steroids: Secondary | ICD-10-CM | POA: Diagnosis not present

## 2014-06-12 DIAGNOSIS — R52 Pain, unspecified: Secondary | ICD-10-CM | POA: Diagnosis not present

## 2014-06-12 DIAGNOSIS — S8002XA Contusion of left knee, initial encounter: Secondary | ICD-10-CM

## 2014-06-12 DIAGNOSIS — M25552 Pain in left hip: Secondary | ICD-10-CM | POA: Diagnosis not present

## 2014-06-12 DIAGNOSIS — Z7901 Long term (current) use of anticoagulants: Secondary | ICD-10-CM | POA: Diagnosis not present

## 2014-06-12 DIAGNOSIS — F0390 Unspecified dementia without behavioral disturbance: Secondary | ICD-10-CM | POA: Diagnosis not present

## 2014-06-12 DIAGNOSIS — W1839XA Other fall on same level, initial encounter: Secondary | ICD-10-CM | POA: Insufficient documentation

## 2014-06-12 DIAGNOSIS — S79922A Unspecified injury of left thigh, initial encounter: Secondary | ICD-10-CM | POA: Diagnosis not present

## 2014-06-12 DIAGNOSIS — S7002XA Contusion of left hip, initial encounter: Secondary | ICD-10-CM | POA: Insufficient documentation

## 2014-06-12 DIAGNOSIS — Y9301 Activity, walking, marching and hiking: Secondary | ICD-10-CM | POA: Insufficient documentation

## 2014-06-12 DIAGNOSIS — Z79899 Other long term (current) drug therapy: Secondary | ICD-10-CM | POA: Diagnosis not present

## 2014-06-12 DIAGNOSIS — G8929 Other chronic pain: Secondary | ICD-10-CM | POA: Diagnosis not present

## 2014-06-12 DIAGNOSIS — Y998 Other external cause status: Secondary | ICD-10-CM | POA: Insufficient documentation

## 2014-06-12 DIAGNOSIS — Z8673 Personal history of transient ischemic attack (TIA), and cerebral infarction without residual deficits: Secondary | ICD-10-CM | POA: Insufficient documentation

## 2014-06-12 DIAGNOSIS — Y92009 Unspecified place in unspecified non-institutional (private) residence as the place of occurrence of the external cause: Secondary | ICD-10-CM | POA: Diagnosis not present

## 2014-06-12 DIAGNOSIS — S8992XA Unspecified injury of left lower leg, initial encounter: Secondary | ICD-10-CM | POA: Diagnosis not present

## 2014-06-12 DIAGNOSIS — I10 Essential (primary) hypertension: Secondary | ICD-10-CM | POA: Diagnosis not present

## 2014-06-12 DIAGNOSIS — M25562 Pain in left knee: Secondary | ICD-10-CM | POA: Diagnosis not present

## 2014-06-12 DIAGNOSIS — W19XXXA Unspecified fall, initial encounter: Secondary | ICD-10-CM

## 2014-06-12 HISTORY — DX: Cerebral infarction, unspecified: I63.9

## 2014-06-12 LAB — URINALYSIS, ROUTINE W REFLEX MICROSCOPIC
Bilirubin Urine: NEGATIVE
GLUCOSE, UA: NEGATIVE mg/dL
KETONES UR: NEGATIVE mg/dL
Leukocytes, UA: NEGATIVE
Nitrite: NEGATIVE
PROTEIN: 30 mg/dL — AB
Specific Gravity, Urine: 1.012 (ref 1.005–1.030)
Urobilinogen, UA: 0.2 mg/dL (ref 0.0–1.0)
pH: 6 (ref 5.0–8.0)

## 2014-06-12 LAB — PROTIME-INR
INR: 2.39 — ABNORMAL HIGH (ref 0.00–1.49)
PROTHROMBIN TIME: 26.3 s — AB (ref 11.6–15.2)

## 2014-06-12 LAB — URINE MICROSCOPIC-ADD ON

## 2014-06-12 NOTE — ED Notes (Signed)
Bed: ZJ67 Expected date:  Expected time:  Means of arrival:  Comments: EMS-fall-hip FX?

## 2014-06-12 NOTE — Progress Notes (Signed)
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  CM discussed with pt that he would receive an orthopedic consult and if they recommend other DME pt and CM can discuss later but CM would write in her note his preference for only use of a cane.  CM discuss that ALF generally may have agencies working to assist DME if needed later.  Pt voiced understanding Pt requested orange juice Cm consulted with ED RN prior to providing pt 8 oz orange juice

## 2014-06-12 NOTE — ED Notes (Signed)
Per EMS-patient was trying to ambulate without cane this am and fell on left side- pain in left hip and left knee-some shortening and rotation of left leg-can bend left knee-c/o 10/10 pain-no LOC, did not hit head-patient is on blood thinners

## 2014-06-12 NOTE — ED Provider Notes (Signed)
CSN: [id number]     Arrival date & time 06/12/14  0935 History   First MD Initiated Contact with Patient 06/12/14 1007     Chief Complaint  Patient presents with  . Fall      Patient is a 80 y.o. male presenting with fall. The history is provided by the patient and a relative.  Fall This is a new problem. The current episode started 1 to 2 hours ago. The problem has not changed since onset.Pertinent negatives include no chest pain and no abdominal pain. Nothing aggravates the symptoms. Nothing relieves the symptoms.   Kurt Thomas presents for evaluation of injuries following fall. He was walking with a cane into his closet and his left knee gave out and he fell and was left side. He reports pain in the left hip and left knee. He has been unable to bear weight since the fall. He denies any recent illnesses. He denies fevers, cough, chest pain, head injury, LOC, nausea, vomiting. He takes Coumadin for A. fib. He has a history of chronic low back pain that has recently been evaluated with MRI. Symptoms severe and constant.  Past Medical History  Diagnosis Date  . High blood pressure   . Memory loss   . Low back pain   . Stroke    Past Surgical History  Procedure Laterality Date  . None     Family History  Problem Relation Age of Onset  . High blood pressure     History  Substance Use Topics  . Smoking status: Never Smoker   . Smokeless tobacco: Never Used  . Alcohol Use: No    Review of Systems  Cardiovascular: Negative for chest pain.  Gastrointestinal: Negative for abdominal pain.  All other systems reviewed and are negative.     Allergies  Review of patient's allergies indicates no known allergies.  Home Medications   Prior to Admission medications   Medication Sig Start Date End Date Taking? Authorizing Provider  albuterol (PROVENTIL) (2.5 MG/3ML) 0.083% nebulizer solution Take 2.5 mg by nebulization every 6 (six) hours as needed for wheezing or shortness of breath.     Historical Provider, MD  carvedilol (COREG) 12.5 MG tablet Take 12.5 mg by mouth 2 (two) times daily with a meal.    Historical Provider, MD  donepezil (ARICEPT) 10 MG tablet Take 10 mg by mouth at bedtime.    Historical Provider, MD  DULoxetine (CYMBALTA) 20 MG capsule Take 1 capsule (20 mg total) by mouth daily. 05/07/14   Marcial Pacas, MD  enalapril (VASOTEC) 20 MG tablet Take 20 mg by mouth daily.    Historical Provider, MD  fluticasone (FLONASE) 50 MCG/ACT nasal spray Place into both nostrils daily.    Historical Provider, MD  perindopril (ACEON) 2 MG tablet Take 2 mg by mouth daily.    Historical Provider, MD  polyethylene glycol (MIRALAX / GLYCOLAX) packet Take 17 g by mouth daily.    Historical Provider, MD  ranitidine (ZANTAC) 150 MG capsule Take 150 mg by mouth 2 (two) times daily.    Historical Provider, MD  sertraline (ZOLOFT) 25 MG tablet 25 mg daily.    Historical Provider, MD  sodium chloride (OCEAN) 0.65 % SOLN nasal spray Place 1 spray into both nostrils as needed for congestion.    Historical Provider, MD  warfarin (COUMADIN) 5 MG tablet Take 5 mg by mouth daily.    Historical Provider, MD   BP 167/88 mmHg  Pulse 75  Temp(Src) 98.5 F (  36.9 C) (Oral)  Resp 16  SpO2 96% Physical Exam  Constitutional: He appears well-developed and well-nourished.  HENT:  Head: Normocephalic and atraumatic.  Eyes: Pupils are equal, round, and reactive to light.  Neck:  No C-spine tenderness  Cardiovascular:  Irregular rhythm no murmur.  Pulmonary/Chest: Effort normal and breath sounds normal. No respiratory distress. He exhibits no tenderness.  Abdominal: Soft. There is no tenderness. There is no rebound and no guarding.  Musculoskeletal:  Tender to palpation over the left hip and left knee. Position of comfort is externally rotated. Patient cannot range hip or knee without significant pain. 2+ DP pulses bilaterally.  Neurological: He is alert.  Mildly confused, disoriented to time. At  baseline per patient's family.  Skin: Skin is warm and dry.  Psychiatric: He has a normal mood and affect. His behavior is normal.  Nursing note and vitals reviewed.   ED Course  Procedures (including critical care time) Labs Review Labs Reviewed  URINALYSIS, ROUTINE W REFLEX MICROSCOPIC - Abnormal; Notable for the following:    Hgb urine dipstick TRACE (*)    Protein, ur 30 (*)    All other components within normal limits  PROTIME-INR - Abnormal; Notable for the following:    Prothrombin Time 26.3 (*)    INR 2.39 (*)    All other components within normal limits  URINE MICROSCOPIC-ADD ON    Imaging Review Dg Femur Left  06/12/2014   CLINICAL DATA:  [place] and complains of left knee and hip pain.  EXAM: LEFT FEMUR - 2 VIEW  COMPARISON:  06/12/2014 left knee  FINDINGS: Degenerative changes at the patellofemoral compartment of the knee. Multiple surgical clips in the left hemipelvis region. Degenerative changes along the superior lateral left hip joint. Negative for a fracture or dislocation. Joint space narrowing and osteophytes in the medial left knee compartment.  IMPRESSION: Negative for an acute fracture or dislocation in the left femur.  Degenerative changes at the left hip and left knee.   Electronically Signed   By: Markus Daft M.D.   On: 06/12/2014 10:20   Dg Knee Complete 4 Views Left  06/12/2014   CLINICAL DATA:  Patient fell ; pain after fall  EXAM: LEFT KNEE - COMPLETE 4+ VIEW  COMPARISON:  None.  FINDINGS: Frontal, lateral, and bilateral oblique views were obtained. There is a small avulsion arising from the medial aspect of the medial proximal tibia just inferior to the plateau. The plateau itself does not appear disrupted. No other evidence of fracture. No dislocation. No appreciable joint effusion. There is moderate narrowing medially and in the patellofemoral joint. There is mild spurring in all compartments. Bones are somewhat osteoporotic. There are foci of arterial vascular  calcification.  IMPRESSION: Small avulsion along the medial aspect of the proximal tibial epiphysis without involvement of the plateau surface. No other evidence of fracture. No dislocation. Moderate osteoarthritic change. No appreciable joint effusion.   Electronically Signed   By: Lowella Grip M.D.   On: 06/12/2014 10:21     EKG Interpretation None      MDM   Final diagnoses:  Knee contusion, left, initial encounter  Contusion, hip, left, initial encounter  Fall, initial encounter    Patient here for evaluation of hip and knee pain following a mechanical fall at home. Patient does have a history of dementia which limits history. On initial evaluation patient had considerable tenderness and pain with range of motion of the left hip and knee. After imaging patient was  able to fully range hip and knee without pain and he had no reproducible tenderness on exam. Patient was able to ambulate the department without any difficulty. Per family patient does like to his get attention and sometimes exaggerates his injuries. There is no history of head injury or loss of consciousness and there is no evidence of head trauma on exam. There is a possible tiny avulsion fracture on the medial surface of the left knee patient is not symptomatically this time. Discussed with Dr. Ninfa Linden with orthopedics, plan for weightbearing as tolerated and outpatient follow-up when necessary. UA obtained because of smelling urine, there is no evidence of UTI. Patient on Coumadin, INR is within therapeutic range. Discussed with patient and family outpatient follow-up and consider physical therapy for assistance with ambulation.    Kurt Reichert, MD 06/12/14 1455

## 2014-06-12 NOTE — Discharge Instructions (Signed)
The x-rays of your hip and knee show arthritis. There is a possible very small fracture on the inside of your left knee. You can follow up with the Orthopedist as needed.  Your INR was 2.39 today.  Please follow up with your family doctor by Monday.  Please meet with Physical Therapy to help with your strength and coordination.   Contusion A contusion is a deep bruise. Contusions are the result of an injury that caused bleeding under the skin. The contusion may turn blue, purple, or yellow. Minor injuries will give you a painless contusion, but more severe contusions may stay painful and swollen for a few weeks.  CAUSES  A contusion is usually caused by a blow, trauma, or direct force to an area of the body. SYMPTOMS   Swelling and redness of the injured area.  Bruising of the injured area.  Tenderness and soreness of the injured area.  Pain. DIAGNOSIS  The diagnosis can be made by taking a history and physical exam. An X-ray, CT scan, or MRI may be needed to determine if there were any associated injuries, such as fractures. TREATMENT  Specific treatment will depend on what area of the body was injured. In general, the best treatment for a contusion is resting, icing, elevating, and applying cold compresses to the injured area. Over-the-counter medicines may also be recommended for pain control. Ask your caregiver what the best treatment is for your contusion. HOME CARE INSTRUCTIONS   Put ice on the injured area.  Put ice in a plastic bag.  Place a towel between your skin and the bag.  Leave the ice on for 15-20 minutes, 3-4 times a day, or as directed by your health care provider.  Only take over-the-counter or prescription medicines for pain, discomfort, or fever as directed by your caregiver. Your caregiver may recommend avoiding anti-inflammatory medicines (aspirin, ibuprofen, and naproxen) for 48 hours because these medicines may increase bruising.  Rest the injured area.  If  possible, elevate the injured area to reduce swelling. SEEK IMMEDIATE MEDICAL CARE IF:   You have increased bruising or swelling.  You have pain that is getting worse.  Your swelling or pain is not relieved with medicines. MAKE SURE YOU:   Understand these instructions.  Will watch your condition.  Will get help right away if you are not doing well or get worse. Document Released: 04/05/2005 Document Revised: 07/01/2013 Document Reviewed: 05/01/2011 [facility] Patient Information 2015 [place], [place]. This information is not intended to replace advice given to you by your health care provider. Make sure you discuss any questions you have with your health care provider.

## 2014-06-17 DIAGNOSIS — M25552 Pain in left hip: Secondary | ICD-10-CM | POA: Diagnosis not present

## 2014-06-17 DIAGNOSIS — I482 Chronic atrial fibrillation: Secondary | ICD-10-CM | POA: Diagnosis not present

## 2014-06-17 DIAGNOSIS — Z7901 Long term (current) use of anticoagulants: Secondary | ICD-10-CM | POA: Diagnosis not present

## 2014-06-19 DIAGNOSIS — M6281 Muscle weakness (generalized): Secondary | ICD-10-CM | POA: Diagnosis not present

## 2014-06-19 DIAGNOSIS — R262 Difficulty in walking, not elsewhere classified: Secondary | ICD-10-CM | POA: Diagnosis not present

## 2014-06-19 DIAGNOSIS — Z9181 History of falling: Secondary | ICD-10-CM | POA: Diagnosis not present

## 2014-06-20 ENCOUNTER — Emergency Department (HOSPITAL_COMMUNITY): Payer: Medicare Other

## 2014-06-20 ENCOUNTER — Encounter (HOSPITAL_COMMUNITY): Payer: Self-pay

## 2014-06-20 ENCOUNTER — Inpatient Hospital Stay (HOSPITAL_COMMUNITY)
Admission: EM | Admit: 2014-06-20 | Discharge: 2014-06-24 | DRG: 470 | Disposition: A | Discharge status: Skilled Nursing Facility | Payer: Medicare Other | Attending: Internal Medicine | Admitting: Internal Medicine

## 2014-06-20 DIAGNOSIS — Z96642 Presence of left artificial hip joint: Secondary | ICD-10-CM | POA: Diagnosis not present

## 2014-06-20 DIAGNOSIS — Y92129 Unspecified place in nursing home as the place of occurrence of the external cause: Secondary | ICD-10-CM | POA: Diagnosis not present

## 2014-06-20 DIAGNOSIS — I2510 Atherosclerotic heart disease of native coronary artery without angina pectoris: Secondary | ICD-10-CM | POA: Diagnosis present

## 2014-06-20 DIAGNOSIS — W050XXA Fall from non-moving wheelchair, initial encounter: Secondary | ICD-10-CM | POA: Diagnosis present

## 2014-06-20 DIAGNOSIS — F0390 Unspecified dementia without behavioral disturbance: Secondary | ICD-10-CM | POA: Diagnosis present

## 2014-06-20 DIAGNOSIS — G309 Alzheimer's disease, unspecified: Secondary | ICD-10-CM | POA: Diagnosis not present

## 2014-06-20 DIAGNOSIS — S72012A Unspecified intracapsular fracture of left femur, initial encounter for closed fracture: Principal | ICD-10-CM | POA: Diagnosis present

## 2014-06-20 DIAGNOSIS — Z951 Presence of aortocoronary bypass graft: Secondary | ICD-10-CM

## 2014-06-20 DIAGNOSIS — I129 Hypertensive chronic kidney disease with stage 1 through stage 4 chronic kidney disease, or unspecified chronic kidney disease: Secondary | ICD-10-CM | POA: Diagnosis present

## 2014-06-20 DIAGNOSIS — E860 Dehydration: Secondary | ICD-10-CM | POA: Diagnosis present

## 2014-06-20 DIAGNOSIS — I482 Chronic atrial fibrillation: Secondary | ICD-10-CM | POA: Diagnosis present

## 2014-06-20 DIAGNOSIS — S299XXA Unspecified injury of thorax, initial encounter: Secondary | ICD-10-CM | POA: Diagnosis not present

## 2014-06-20 DIAGNOSIS — N289 Disorder of kidney and ureter, unspecified: Secondary | ICD-10-CM | POA: Diagnosis present

## 2014-06-20 DIAGNOSIS — D62 Acute posthemorrhagic anemia: Secondary | ICD-10-CM | POA: Diagnosis not present

## 2014-06-20 DIAGNOSIS — M21252 Flexion deformity, left hip: Secondary | ICD-10-CM | POA: Diagnosis not present

## 2014-06-20 DIAGNOSIS — Z8546 Personal history of malignant neoplasm of prostate: Secondary | ICD-10-CM | POA: Diagnosis not present

## 2014-06-20 DIAGNOSIS — Z8673 Personal history of transient ischemic attack (TIA), and cerebral infarction without residual deficits: Secondary | ICD-10-CM | POA: Diagnosis not present

## 2014-06-20 DIAGNOSIS — R2681 Unsteadiness on feet: Secondary | ICD-10-CM | POA: Diagnosis not present

## 2014-06-20 DIAGNOSIS — Z87891 Personal history of nicotine dependence: Secondary | ICD-10-CM | POA: Diagnosis not present

## 2014-06-20 DIAGNOSIS — R030 Elevated blood-pressure reading, without diagnosis of hypertension: Secondary | ICD-10-CM | POA: Diagnosis not present

## 2014-06-20 DIAGNOSIS — R0602 Shortness of breath: Secondary | ICD-10-CM

## 2014-06-20 DIAGNOSIS — Z9181 History of falling: Secondary | ICD-10-CM | POA: Diagnosis not present

## 2014-06-20 DIAGNOSIS — R41841 Cognitive communication deficit: Secondary | ICD-10-CM | POA: Diagnosis not present

## 2014-06-20 DIAGNOSIS — R1312 Dysphagia, oropharyngeal phase: Secondary | ICD-10-CM | POA: Diagnosis not present

## 2014-06-20 DIAGNOSIS — Z471 Aftercare following joint replacement surgery: Secondary | ICD-10-CM | POA: Diagnosis not present

## 2014-06-20 DIAGNOSIS — I10 Essential (primary) hypertension: Secondary | ICD-10-CM | POA: Diagnosis present

## 2014-06-20 DIAGNOSIS — N183 Chronic kidney disease, stage 3 (moderate): Secondary | ICD-10-CM | POA: Diagnosis present

## 2014-06-20 DIAGNOSIS — S72002A Fracture of unspecified part of neck of left femur, initial encounter for closed fracture: Secondary | ICD-10-CM

## 2014-06-20 DIAGNOSIS — E875 Hyperkalemia: Secondary | ICD-10-CM | POA: Diagnosis present

## 2014-06-20 DIAGNOSIS — M6281 Muscle weakness (generalized): Secondary | ICD-10-CM | POA: Diagnosis not present

## 2014-06-20 DIAGNOSIS — S72045A Nondisplaced fracture of base of neck of left femur, initial encounter for closed fracture: Secondary | ICD-10-CM | POA: Diagnosis not present

## 2014-06-20 DIAGNOSIS — Z7901 Long term (current) use of anticoagulants: Secondary | ICD-10-CM

## 2014-06-20 DIAGNOSIS — I4891 Unspecified atrial fibrillation: Secondary | ICD-10-CM | POA: Diagnosis present

## 2014-06-20 DIAGNOSIS — C61 Malignant neoplasm of prostate: Secondary | ICD-10-CM | POA: Diagnosis not present

## 2014-06-20 DIAGNOSIS — R079 Chest pain, unspecified: Secondary | ICD-10-CM | POA: Diagnosis not present

## 2014-06-20 DIAGNOSIS — M545 Low back pain: Secondary | ICD-10-CM | POA: Diagnosis not present

## 2014-06-20 DIAGNOSIS — M25552 Pain in left hip: Secondary | ICD-10-CM | POA: Diagnosis not present

## 2014-06-20 DIAGNOSIS — S72009A Fracture of unspecified part of neck of unspecified femur, initial encounter for closed fracture: Secondary | ICD-10-CM

## 2014-06-20 DIAGNOSIS — W19XXXA Unspecified fall, initial encounter: Secondary | ICD-10-CM

## 2014-06-20 DIAGNOSIS — S72002D Fracture of unspecified part of neck of left femur, subsequent encounter for closed fracture with routine healing: Secondary | ICD-10-CM | POA: Diagnosis not present

## 2014-06-20 DIAGNOSIS — S72002G Fracture of unspecified part of neck of left femur, subsequent encounter for closed fracture with delayed healing: Secondary | ICD-10-CM | POA: Diagnosis not present

## 2014-06-20 DIAGNOSIS — S3992XA Unspecified injury of lower back, initial encounter: Secondary | ICD-10-CM | POA: Diagnosis not present

## 2014-06-20 DIAGNOSIS — R278 Other lack of coordination: Secondary | ICD-10-CM | POA: Diagnosis not present

## 2014-06-20 HISTORY — DX: Atherosclerotic heart disease of native coronary artery without angina pectoris: I25.10

## 2014-06-20 HISTORY — DX: Malignant neoplasm of prostate: C61

## 2014-06-20 LAB — CBC WITH DIFFERENTIAL/PLATELET
BASOS ABS: 0 10*3/uL (ref 0.0–0.1)
Basophils Relative: 0 % (ref 0–1)
Eosinophils Absolute: 0.1 10*3/uL (ref 0.0–0.7)
Eosinophils Relative: 1 % (ref 0–5)
HEMATOCRIT: 40.9 % (ref 39.0–52.0)
Hemoglobin: 13.2 g/dL (ref 13.0–17.0)
LYMPHS PCT: 9 % — AB (ref 12–46)
Lymphs Abs: 1.1 10*3/uL (ref 0.7–4.0)
MCH: 30 pg (ref 26.0–34.0)
MCHC: 32.3 g/dL (ref 30.0–36.0)
MCV: 93 fL (ref 78.0–100.0)
Monocytes Absolute: 1.4 10*3/uL — ABNORMAL HIGH (ref 0.1–1.0)
Monocytes Relative: 12 % (ref 3–12)
NEUTROS ABS: 9 10*3/uL — AB (ref 1.7–7.7)
Neutrophils Relative %: 78 % — ABNORMAL HIGH (ref 43–77)
PLATELETS: 203 10*3/uL (ref 150–400)
RBC: 4.4 MIL/uL (ref 4.22–5.81)
RDW: 14.4 % (ref 11.5–15.5)
WBC: 11.6 10*3/uL — AB (ref 4.0–10.5)

## 2014-06-20 LAB — COMPREHENSIVE METABOLIC PANEL
ALK PHOS: 88 U/L (ref 39–117)
ALT: 17 U/L (ref 0–53)
AST: 18 U/L (ref 0–37)
Albumin: 3.3 g/dL — ABNORMAL LOW (ref 3.5–5.2)
Anion gap: 14 (ref 5–15)
BILIRUBIN TOTAL: 0.6 mg/dL (ref 0.3–1.2)
BUN: 52 mg/dL — ABNORMAL HIGH (ref 6–23)
CALCIUM: 9.5 mg/dL (ref 8.4–10.5)
CHLORIDE: 102 meq/L (ref 96–112)
CO2: 22 meq/L (ref 19–32)
Creatinine, Ser: 1.63 mg/dL — ABNORMAL HIGH (ref 0.50–1.35)
GFR calc non Af Amer: 35 mL/min — ABNORMAL LOW (ref >90)
GFR, EST AFRICAN AMERICAN: 40 mL/min — AB (ref >90)
Glucose, Bld: 126 mg/dL — ABNORMAL HIGH (ref 70–99)
Potassium: 5.3 mEq/L (ref 3.7–5.3)
SODIUM: 138 meq/L (ref 137–147)
Total Protein: 6.9 g/dL (ref 6.0–8.3)

## 2014-06-20 LAB — PROTIME-INR
INR: 2.56 — AB (ref 0.00–1.49)
PROTHROMBIN TIME: 27.7 s — AB (ref 11.6–15.2)

## 2014-06-20 LAB — TROPONIN I

## 2014-06-20 MED ORDER — SODIUM CHLORIDE 0.9 % IV SOLN
INTRAVENOUS | Status: AC
  Administered 2014-06-20: 75 mL/h via INTRAVENOUS

## 2014-06-20 MED ORDER — ALBUTEROL SULFATE (2.5 MG/3ML) 0.083% IN NEBU: 2.5000 mg | INHALATION_SOLUTION | Freq: Four times a day (QID) | RESPIRATORY_TRACT | Status: DC | PRN

## 2014-06-20 MED ORDER — MORPHINE SULFATE 4 MG/ML IJ SOLN
4.0000 mg | Freq: Once | INTRAMUSCULAR | Status: AC
Start: 2014-06-20 — End: 2014-06-20
  Administered 2014-06-20: 4 mg via INTRAVENOUS
  Filled 2014-06-20: qty 1

## 2014-06-20 MED ORDER — PHYTONADIONE 5 MG PO TABS
2.5000 mg | ORAL_TABLET | Freq: Once | ORAL | Status: AC
  Administered 2014-06-20: 2.5 mg via ORAL
  Filled 2014-06-20: qty 1

## 2014-06-20 NOTE — ED Notes (Signed)
He is awake and alert and oriented to all but day/date.  He tells [place] he "slowly slipped out of my wheelchair".  He c/o left hip area pain.  He further tells [place] (accurately) that he fell about two weeks ago and had left hip pain at that time also.  He is in no distress.  CMS intact all extremities.

## 2014-06-20 NOTE — ED Notes (Signed)
Bed: WA10 Expected date: 06/20/14 Expected time: 5:56 PM Means of arrival: Ambulance Comments: Unwitnessed fall, left hip pain

## 2014-06-20 NOTE — ED Provider Notes (Signed)
CSN: [id number]     Arrival date & time 06/20/14  1758 History   First MD Initiated Contact with Patient 06/20/14 1804     Chief Complaint  Patient presents with  . Fall     (Consider location/radiation/quality/duration/timing/severity/associated sxs/prior Treatment) HPI Comments: Patient here after slipping out of his bed from a wheelchair prior to arrival. Complains of pain to his left hip. Patient unable to ambulate. Pain characterized as sharp and worse with movement. EMS was called and patient transported here. No treatment use prior to arrival. Denies any history of head injury.  Patient is a 80 y.o. male presenting with fall. The history is provided by the patient.  Fall    Past Medical History  Diagnosis Date  . High blood pressure   . Memory loss   . Low back pain   . Stroke    Past Surgical History  Procedure Laterality Date  . None     Family History  Problem Relation Age of Onset  . High blood pressure     History  Substance Use Topics  . Smoking status: Never Smoker   . Smokeless tobacco: Never Used  . Alcohol Use: No    Review of Systems  All other systems reviewed and are negative.     Allergies  Review of patient's allergies indicates no known allergies.  Home Medications   Prior to Admission medications   Medication Sig Start Date End Date Taking? Authorizing Provider  albuterol (PROVENTIL) (2.5 MG/3ML) 0.083% nebulizer solution Take 2.5 mg by nebulization every 6 (six) hours as needed for wheezing or shortness of breath.    Historical Provider, MD  Camphor-Eucalyptus-Menthol (VICKS VAPORUB) 4.7-1.2-2.6 % OINT Apply 1 application topically at bedtime as needed (for congestion).    Historical Provider, MD  carvedilol (COREG) 12.5 MG tablet Take 12.5 mg by mouth 2 (two) times daily with a meal.    Historical Provider, MD  docusate sodium (COLACE) 100 MG capsule Take 100 mg by mouth at bedtime.    Historical Provider, MD  donepezil (ARICEPT) 10  MG tablet Take 10 mg by mouth at bedtime.    Historical Provider, MD  DULoxetine (CYMBALTA) 20 MG capsule Take 1 capsule (20 mg total) by mouth daily. Patient taking differently: Take 20 mg by mouth daily with breakfast.  05/07/14   Marcial Pacas, MD  enalapril (VASOTEC) 20 MG tablet Take 20 mg by mouth 2 (two) times daily.     Historical Provider, MD  Menthol, Topical Analgesic, (ICY HOT EX) Apply 1 application topically every 8 (eight) hours as needed (for pain).    Historical Provider, MD  promethazine-codeine (PHENERGAN WITH CODEINE) 6.25-10 MG/5ML syrup Take 5 mLs by mouth 4 (four) times daily as needed for cough.    Historical Provider, MD  ranitidine (ZANTAC) 150 MG capsule Take 150 mg by mouth daily with breakfast.     Historical Provider, MD  sodium chloride (OCEAN) 0.65 % SOLN nasal spray Place 2 sprays into both nostrils 4 (four) times daily as needed (for dry nose).     Historical Provider, MD  warfarin (COUMADIN) 5 MG tablet Take 2.5-5 mg by mouth See admin instructions. Takes 5mg  on Sunday, Tuesday, Wednesday, Thursday, and Saturday Then takes 2.5mg  on Monday and Friday    Historical Provider, MD   BP 192/102 mmHg  Pulse 92  Temp(Src) 98.3 F (36.8 C) (Oral)  Resp 20  SpO2 96% Physical Exam  Constitutional: He is oriented to person, place, and time. He appears  well-developed and well-nourished.  Non-toxic appearance. No distress.  HENT:  Head: Normocephalic and atraumatic.  Eyes: Conjunctivae, EOM and lids are normal. Pupils are equal, round, and reactive to light.  Neck: Normal range of motion. Neck supple. No tracheal deviation present. No thyroid mass present.  Cardiovascular: Normal rate, regular rhythm and normal heart sounds.  Exam reveals no gallop.   No murmur heard. Pulmonary/Chest: Effort normal and breath sounds normal. No stridor. No respiratory distress. He has no decreased breath sounds. He has no wheezes. He has no rhonchi. He has no rales.  Abdominal: Soft. Normal  appearance and bowel sounds are normal. He exhibits no distension. There is no tenderness. There is no rebound and no CVA tenderness.  Musculoskeletal: Normal range of motion. He exhibits no edema or tenderness.  Left lower extremity shortened and rotated externally. Severe pain with any movement. Tender to palpation in lumbar spine. Neurovascular intact in the left foot.  Neurological: He is alert and oriented to person, place, and time. He displays no tremor. No cranial nerve deficit or sensory deficit. GCS eye subscore is 4. GCS verbal subscore is 5. GCS motor subscore is 6.  Right lower extremity 5 out of 5 strength. Left lower extremity limited by pain.  Skin: Skin is warm and dry. No abrasion and no rash noted.  Psychiatric: He has a normal mood and affect. His speech is normal and behavior is normal.  Nursing note and vitals reviewed.   ED Course  Procedures (including critical care time) Labs Review Labs Reviewed  URINE CULTURE  CBC WITH DIFFERENTIAL  COMPREHENSIVE METABOLIC PANEL  URINALYSIS, ROUTINE W REFLEX MICROSCOPIC  PROTIME-INR    Imaging Review No results found.   EKG Interpretation None      MDM   Final diagnoses:  Fall  SOB (shortness of breath)    Patient given morphine for pain here for his hip fracture. Patient to be given IV fluids for likely dehydration. Consult called to the orthopedic surgeon and is pending at this time. Spoke with [facility] and patient to be admitted    Leota Jacobsen, MD 06/20/14 2111

## 2014-06-20 NOTE — ED Notes (Signed)
Attempted I/O catheter for U/A. Felt strong amount of resistance. Unable to progress catheter any further. Unable to obtain U/A. EDP Zenia Resides notified

## 2014-06-20 NOTE — H&P (Addendum)
Kurt Thomas is an 80 y.o. male.    Pcp:  Gus Height  Chief Complaint: fall HPI: 81 yo male with hx of pafib, CVA, hypertension c/o fall and c/o left hip pain.  Pt was brought to the ED and found to have left subcapital hip fracture.  Pt had mild renal insufficiency as well.   Pt denies syncope, cp, palp, sob, lower ext edema.  Pt will be admitted for L hip fracture.   Past Medical History  Diagnosis Date  . High blood pressure   . Memory loss   . Low back pain   . Stroke   . Atrial fibrillation     Past Surgical History  Procedure Laterality Date  . Coronary artery bypass graft      Family History  Problem Relation Age of Onset  . High blood pressure     Social History:  reports that he has never smoked. He has never used smokeless tobacco. He reports that he does not drink alcohol or use illicit drugs.  Allergies: No Known Allergies   (Not in a hospital admission)  Results for orders placed or performed during the hospital encounter of 06/20/14 (from the past 48 hour(s))  CBC with Differential     Status: Abnormal   Collection Time: 06/20/14  7:53 PM  Result Value Ref Range   WBC 11.6 (H) 4.0 - 10.5 K/uL   RBC 4.40 4.22 - 5.81 MIL/uL   Hemoglobin 13.2 13.0 - 17.0 g/dL   HCT 40.9 39.0 - 52.0 %   MCV 93.0 78.0 - 100.0 fL   MCH 30.0 26.0 - 34.0 pg   MCHC 32.3 30.0 - 36.0 g/dL   RDW 14.4 11.5 - 15.5 %   Platelets 203 150 - 400 K/uL   Neutrophils Relative % 78 (H) 43 - 77 %   Neutro Abs 9.0 (H) 1.7 - 7.7 K/uL   Lymphocytes Relative 9 (L) 12 - 46 %   Lymphs Abs 1.1 0.7 - 4.0 K/uL   Monocytes Relative 12 3 - 12 %   Monocytes Absolute 1.4 (H) 0.1 - 1.0 K/uL   Eosinophils Relative 1 0 - 5 %   Eosinophils Absolute 0.1 0.0 - 0.7 K/uL   Basophils Relative 0 0 - 1 %   Basophils Absolute 0.0 0.0 - 0.1 K/uL  Comprehensive metabolic panel     Status: Abnormal   Collection Time: 06/20/14  7:53 PM  Result Value Ref Range   Sodium 138 137 - 147 mEq/L   Potassium 5.3 3.7 - 5.3  mEq/L   Chloride 102 96 - 112 mEq/L   CO2 22 19 - 32 mEq/L   Glucose, Bld 126 (H) 70 - 99 mg/dL   BUN 52 (H) 6 - 23 mg/dL   Creatinine, Ser 1.63 (H) 0.50 - 1.35 mg/dL   Calcium 9.5 8.4 - 10.5 mg/dL   Total Protein 6.9 6.0 - 8.3 g/dL   Albumin 3.3 (L) 3.5 - 5.2 g/dL   AST 18 0 - 37 U/L   ALT 17 0 - 53 U/L   Alkaline Phosphatase 88 39 - 117 U/L   Total Bilirubin 0.6 0.3 - 1.2 mg/dL   GFR calc non Af Amer 35 (L) >90 mL/min   GFR calc Af Amer 40 (L) >90 mL/min    Comment: (NOTE) The eGFR has been calculated using the CKD EPI equation. This calculation has not been validated in all clinical situations. eGFR's persistently <90 mL/min signify possible Chronic Kidney Disease.  Anion gap 14 5 - 15  Protime-INR     Status: Abnormal   Collection Time: 06/20/14  7:53 PM  Result Value Ref Range   Prothrombin Time 27.7 (H) 11.6 - 15.2 seconds   INR 2.56 (H) 0.00 - 1.49   Dg Chest 1 View  06/20/2014   CLINICAL DATA:  Recent fall with chest pain an known left femoral fracture  EXAM: CHEST - 1 VIEW  COMPARISON:  None.  FINDINGS: Cardiac shadow is enlarged. Postsurgical changes are seen. A few calcified granulomas are noted. No focal infiltrate or sizable effusion is seen. No bony abnormality is noted.  IMPRESSION: No acute abnormality noted.   Electronically Signed   By: Inez Catalina M.D.   On: 06/20/2014 19:06   Dg Lumbar Spine Complete  06/20/2014   CLINICAL DATA:  Fall today with low back pain  EXAM: LUMBAR SPINE - COMPLETE 4+ VIEW  COMPARISON:  None.  FINDINGS: Five lumbar type vertebral bodies are well visualized. Vertebral body height is well maintained. No spondylolysis or spondylolisthesis is seen. Mild disc space narrowing is noted at L5-S1. No gross soft tissue abnormality is seen.  IMPRESSION: No acute abnormality noted.   Electronically Signed   By: Inez Catalina M.D.   On: 06/20/2014 19:05   Dg Hip Complete Left  06/20/2014   CLINICAL DATA:  Fall today with left hip pain, initial  encounter  EXAM: LEFT HIP - COMPLETE 2+ VIEW  COMPARISON:  None.  FINDINGS: A subcapital left femoral neck fracture is noted with impaction and angulation at the fracture site. The pelvic ring is intact. No other focal abnormality is seen.  IMPRESSION: Left subcapital femoral neck fracture.   Electronically Signed   By: Inez Catalina M.D.   On: 06/20/2014 19:04    Review of Systems  Constitutional: Negative for fever, chills, weight loss, malaise/fatigue and diaphoresis.  HENT: Negative for congestion, ear discharge, ear pain, hearing loss, nosebleeds and tinnitus.   Eyes: Negative for blurred vision, double vision, photophobia, pain, discharge and redness.  Respiratory: Negative for cough, hemoptysis, sputum production, shortness of breath, wheezing and stridor.   Cardiovascular: Negative for chest pain, palpitations, orthopnea, claudication, leg swelling and PND.  Gastrointestinal: Positive for constipation. Negative for heartburn, nausea, vomiting, abdominal pain, diarrhea, blood in stool and melena.  Genitourinary: Negative for dysuria, urgency, frequency, hematuria and flank pain.  Musculoskeletal: Positive for back pain. Negative for myalgias, joint pain, falls and neck pain.  Skin: Negative for itching and rash.  Neurological: Negative for dizziness, tingling, tremors, sensory change, speech change, focal weakness, seizures, loss of consciousness, weakness and headaches.  Endo/Heme/Allergies: Negative for environmental allergies and polydipsia. Does not bruise/bleed easily.  Psychiatric/Behavioral: Negative for depression, suicidal ideas, hallucinations, memory loss and substance abuse. The patient is not nervous/anxious and does not have insomnia.     Blood pressure 170/94, pulse 96, temperature 97.6 F (36.4 C), temperature source Oral, resp. rate 22, SpO2 97 %. Physical Exam  Constitutional: He is oriented to person, place, and time. He appears well-developed and well-nourished.  HENT:   Head: Normocephalic and atraumatic.  Eyes: Conjunctivae and EOM are normal. Pupils are equal, round, and reactive to light.  Neck: Normal range of motion. Neck supple. No JVD present. No tracheal deviation present. No thyromegaly present.  Cardiovascular: Exam reveals no gallop and no friction rub.   No murmur heard. Irr, irr, s1, s2,   Respiratory: Effort normal and breath sounds normal. No respiratory distress. He has no wheezes.  He has no rales. He exhibits no tenderness.  GI: Soft. Bowel sounds are normal. He exhibits no distension. There is no tenderness. There is no rebound and no guarding.  Musculoskeletal: He exhibits no edema or tenderness.  Pain with movement L hip  Lymphadenopathy:    He has no cervical adenopathy.  Neurological: He is alert and oriented to person, place, and time. He has normal reflexes. He displays normal reflexes. No cranial nerve deficit. He exhibits normal muscle tone. Coordination normal.  Skin: Skin is warm and dry. No rash noted. No erythema. No pallor.  Psychiatric: He has a normal mood and affect. His behavior is normal. Judgment and thought content normal.     Assessment/Plan L hip subcapital fracture Npo ED has consulted orthopedics, we appreciate their input.  Morphine for pain as needed Tramadol for pain as needed EKG 12 lead ordered Check cpk, mb , trop Awaiting u/a Pt is moderate risk for surgery but benefits of surgery outweight the risk,  If ekg, trop negative then pt is medically cleared to have surgery   Renal insufficiency Hydrate gently with normal saline Hold enalapril Check cmp  Afib Hold coumadin Check INR in am Check cbc  Hypertension Hydralazine 5mg  iv q6h prn  Hyperglycemia Check hga1c   Jani Gravel 06/20/2014, 9:38 PM

## 2014-06-21 DIAGNOSIS — I4891 Unspecified atrial fibrillation: Secondary | ICD-10-CM

## 2014-06-21 DIAGNOSIS — S72002G Fracture of unspecified part of neck of left femur, subsequent encounter for closed fracture with delayed healing: Secondary | ICD-10-CM

## 2014-06-21 LAB — COMPREHENSIVE METABOLIC PANEL
ALBUMIN: 2.8 g/dL — AB (ref 3.5–5.2)
ALT: 14 U/L (ref 0–53)
AST: 15 U/L (ref 0–37)
Alkaline Phosphatase: 74 U/L (ref 39–117)
Anion gap: 11 (ref 5–15)
BUN: 54 mg/dL — ABNORMAL HIGH (ref 6–23)
CALCIUM: 8.9 mg/dL (ref 8.4–10.5)
CO2: 22 mEq/L (ref 19–32)
Chloride: 105 mEq/L (ref 96–112)
Creatinine, Ser: 1.68 mg/dL — ABNORMAL HIGH (ref 0.50–1.35)
GFR calc Af Amer: 39 mL/min — ABNORMAL LOW (ref >90)
GFR calc non Af Amer: 33 mL/min — ABNORMAL LOW (ref >90)
Glucose, Bld: 145 mg/dL — ABNORMAL HIGH (ref 70–99)
Potassium: 4.9 mEq/L (ref 3.7–5.3)
SODIUM: 138 meq/L (ref 137–147)
TOTAL PROTEIN: 6 g/dL (ref 6.0–8.3)
Total Bilirubin: 0.6 mg/dL (ref 0.3–1.2)

## 2014-06-21 LAB — URINALYSIS, ROUTINE W REFLEX MICROSCOPIC
BILIRUBIN URINE: NEGATIVE
GLUCOSE, UA: NEGATIVE mg/dL
KETONES UR: NEGATIVE mg/dL
Nitrite: NEGATIVE
Protein, ur: NEGATIVE mg/dL
Specific Gravity, Urine: 1.017 (ref 1.005–1.030)
UROBILINOGEN UA: 0.2 mg/dL (ref 0.0–1.0)
pH: 6 (ref 5.0–8.0)

## 2014-06-21 LAB — URINE MICROSCOPIC-ADD ON

## 2014-06-21 LAB — PROTIME-INR
INR: 2.68 — AB (ref 0.00–1.49)
Prothrombin Time: 28.7 seconds — ABNORMAL HIGH (ref 11.6–15.2)

## 2014-06-21 LAB — CBC WITH DIFFERENTIAL/PLATELET
BASOS ABS: 0 10*3/uL (ref 0.0–0.1)
BASOS PCT: 0 % (ref 0–1)
EOS ABS: 0.2 10*3/uL (ref 0.0–0.7)
EOS PCT: 2 % (ref 0–5)
HCT: 36.7 % — ABNORMAL LOW (ref 39.0–52.0)
Hemoglobin: 11.6 g/dL — ABNORMAL LOW (ref 13.0–17.0)
LYMPHS PCT: 14 % (ref 12–46)
Lymphs Abs: 1.2 10*3/uL (ref 0.7–4.0)
MCH: 29.6 pg (ref 26.0–34.0)
MCHC: 31.6 g/dL (ref 30.0–36.0)
MCV: 93.6 fL (ref 78.0–100.0)
Monocytes Absolute: 1.1 10*3/uL — ABNORMAL HIGH (ref 0.1–1.0)
Monocytes Relative: 13 % — ABNORMAL HIGH (ref 3–12)
Neutro Abs: 6 10*3/uL (ref 1.7–7.7)
Neutrophils Relative %: 71 % (ref 43–77)
PLATELETS: 180 10*3/uL (ref 150–400)
RBC: 3.92 MIL/uL — ABNORMAL LOW (ref 4.22–5.81)
RDW: 14.7 % (ref 11.5–15.5)
WBC: 8.5 10*3/uL (ref 4.0–10.5)

## 2014-06-21 LAB — CK TOTAL AND CKMB (NOT AT ARMC)
CK, MB: 2.5 ng/mL (ref 0.3–4.0)
RELATIVE INDEX: 1.6 (ref 0.0–2.5)
Total CK: 153 U/L (ref 7–232)

## 2014-06-21 LAB — MRSA PCR SCREENING: MRSA BY PCR: NEGATIVE

## 2014-06-21 MED ORDER — CARVEDILOL 25 MG PO TABS
25.0000 mg | ORAL_TABLET | Freq: Two times a day (BID) | ORAL | Status: DC
  Administered 2014-06-21: 25 mg via ORAL
  Filled 2014-06-21: qty 1

## 2014-06-21 MED ORDER — FAMOTIDINE 20 MG PO TABS
20.0000 mg | ORAL_TABLET | Freq: Every day | ORAL | Status: DC
  Administered 2014-06-21: 20 mg via ORAL
  Filled 2014-06-21: qty 1

## 2014-06-21 MED ORDER — HYDRALAZINE HCL 20 MG/ML IJ SOLN
5.0000 mg | Freq: Four times a day (QID) | INTRAMUSCULAR | Status: DC | PRN
  Filled 2014-06-21: qty 1

## 2014-06-21 MED ORDER — DIPHENHYDRAMINE HCL 25 MG PO CAPS: 25.0000 mg | ORAL_CAPSULE | Freq: Four times a day (QID) | ORAL | Status: DC | PRN

## 2014-06-21 MED ORDER — SODIUM CHLORIDE 0.9 % IJ SOLN
3.0000 mL | Freq: Two times a day (BID) | INTRAMUSCULAR | Status: DC
  Administered 2014-06-21 – 2014-06-23 (×4): 3 mL via INTRAVENOUS

## 2014-06-21 MED ORDER — DOCUSATE SODIUM 100 MG PO CAPS
100.0000 mg | ORAL_CAPSULE | Freq: Every day | ORAL | Status: DC
  Administered 2014-06-21 – 2014-06-23 (×3): 100 mg via ORAL
  Filled 2014-06-21 (×5): qty 1

## 2014-06-21 MED ORDER — HYDROCODONE-ACETAMINOPHEN 5-325 MG PO TABS: 1.0000 | ORAL_TABLET | ORAL | Status: DC | PRN

## 2014-06-21 MED ORDER — CARVEDILOL 12.5 MG PO TABS
12.5000 mg | ORAL_TABLET | Freq: Two times a day (BID) | ORAL | Status: DC
  Administered 2014-06-21 – 2014-06-24 (×5): 12.5 mg via ORAL
  Filled 2014-06-21 (×5): qty 1

## 2014-06-21 MED ORDER — WARFARIN SODIUM 5 MG PO TABS
5.0000 mg | ORAL_TABLET | Freq: Every day | ORAL | Status: DC
  Filled 2014-06-21 (×2): qty 1

## 2014-06-21 MED ORDER — DONEPEZIL HCL 10 MG PO TABS
10.0000 mg | ORAL_TABLET | Freq: Every day | ORAL | Status: DC
  Administered 2014-06-21 – 2014-06-23 (×3): 10 mg via ORAL
  Filled 2014-06-21 (×5): qty 1

## 2014-06-21 MED ORDER — PHYTONADIONE 5 MG PO TABS
5.0000 mg | ORAL_TABLET | Freq: Once | ORAL | Status: AC
  Administered 2014-06-21: 5 mg via ORAL
  Filled 2014-06-21: qty 1

## 2014-06-21 MED ORDER — DULOXETINE HCL 20 MG PO CPEP
20.0000 mg | ORAL_CAPSULE | Freq: Every day | ORAL | Status: DC
  Administered 2014-06-21: 20 mg via ORAL
  Filled 2014-06-21: qty 1

## 2014-06-21 MED ORDER — ACETAMINOPHEN 325 MG PO TABS: 650.0000 mg | ORAL_TABLET | Freq: Four times a day (QID) | ORAL | Status: DC | PRN

## 2014-06-21 MED ORDER — WARFARIN - PHYSICIAN DOSING INPATIENT: Freq: Every day | Status: DC

## 2014-06-21 MED ORDER — MORPHINE SULFATE 2 MG/ML IJ SOLN: 1.0000 mg | INTRAMUSCULAR | Status: DC | PRN

## 2014-06-21 MED ORDER — TRAMADOL HCL 50 MG PO TABS: 50.0000 mg | ORAL_TABLET | Freq: Four times a day (QID) | ORAL | Status: DC | PRN

## 2014-06-21 MED ORDER — HYDROCODONE-ACETAMINOPHEN 5-325 MG PO TABS
1.0000 | ORAL_TABLET | ORAL | Status: DC | PRN
  Administered 2014-06-21 – 2014-06-24 (×8): 1 via ORAL
  Filled 2014-06-21 (×9): qty 1

## 2014-06-21 MED ORDER — MORPHINE SULFATE 4 MG/ML IJ SOLN: 4.0000 mg | INTRAMUSCULAR | Status: DC | PRN

## 2014-06-21 MED ORDER — SALINE SPRAY 0.65 % NA SOLN
2.0000 | Freq: Four times a day (QID) | NASAL | Status: DC | PRN
  Filled 2014-06-21: qty 44

## 2014-06-21 MED ORDER — ONDANSETRON HCL 4 MG/2ML IJ SOLN: 4.0000 mg | Freq: Three times a day (TID) | INTRAMUSCULAR | Status: DC | PRN

## 2014-06-21 MED ORDER — ONDANSETRON HCL 4 MG/2ML IJ SOLN: 4.0000 mg | Freq: Four times a day (QID) | INTRAMUSCULAR | Status: DC | PRN

## 2014-06-21 MED ORDER — ACETAMINOPHEN 650 MG RE SUPP: 650.0000 mg | Freq: Four times a day (QID) | RECTAL | Status: DC | PRN

## 2014-06-21 NOTE — Progress Notes (Signed)
PROGRESS NOTE  Kurt Thomas IEP:[id number] DOB: 01-18-1919 DOA: 06/20/2014 PCP:  Melinda Crutch, MD  Brief history  80 year old male with a history of paroxysmal fibrillation, hypertension, dementia presents with a mechanical fall. The patient is a poor historian due to his history of dementia. His history is obtained from review of the chart and speaking with the patient's daughter at the bedside. Apparently the patient fell out of his chair onto his left side and was unable to get up. At baseline, the patient usually ambulates with cane; however, the patient had another mechanical fall on 06/12/2014 after which he began using a walker. At baseline, the patient is pleasant and confused but is able to carry on a conversation without difficulty. There was no history of syncope, vomiting, diarrhea, chest pain, shortness of breath. Assessment/Plan: Left Subcapital Femoral Neck Fracture -Orthopedic surgery has been consulted--I spoke with Dr. Marlou Sa -pain control -PT/OT after surgery -At this time, the patient is medically stable for surgery once his INR is corrected atrial fibrillation -Presently rate controlled -Continue carvedilol -Hold Coumadin in preparation for surgery -Give additional vitamin K 5 mg -Recheck INR in the morning Renal insufficiency -Unclear what the patient's previous renal baseline, but suspect he has CKD -Continue to monitor Hypertension -Continue carvedilol -Hold ACEi as his K is trending up Dementia -Continue Aricept   Family Communication:   Daughter updated at beside Disposition Plan:  Likely SNF when medically stable       Procedures/Studies: Dg Chest 1 View  06/20/2014   CLINICAL DATA:  Recent fall with chest pain an known left femoral fracture  EXAM: CHEST - 1 VIEW  COMPARISON:  None.  FINDINGS: Cardiac shadow is enlarged. Postsurgical changes are seen. A few calcified granulomas are noted. No focal infiltrate or sizable effusion is seen. No bony  abnormality is noted.  IMPRESSION: No acute abnormality noted.   Electronically Signed   By: Inez Catalina M.D.   On: 06/20/2014 19:06   Dg Lumbar Spine Complete  06/20/2014   CLINICAL DATA:  Fall today with low back pain  EXAM: LUMBAR SPINE - COMPLETE 4+ VIEW  COMPARISON:  None.  FINDINGS: Five lumbar type vertebral bodies are well visualized. Vertebral body height is well maintained. No spondylolysis or spondylolisthesis is seen. Mild disc space narrowing is noted at L5-S1. No gross soft tissue abnormality is seen.  IMPRESSION: No acute abnormality noted.   Electronically Signed   By: Inez Catalina M.D.   On: 06/20/2014 19:05   Dg Hip Complete Left  06/20/2014   CLINICAL DATA:  Fall today with left hip pain, initial encounter  EXAM: LEFT HIP - COMPLETE 2+ VIEW  COMPARISON:  None.  FINDINGS: A subcapital left femoral neck fracture is noted with impaction and angulation at the fracture site. The pelvic ring is intact. No other focal abnormality is seen.  IMPRESSION: Left subcapital femoral neck fracture.   Electronically Signed   By: Inez Catalina M.D.   On: 06/20/2014 19:04   Dg Femur Left  06/12/2014   CLINICAL DATA:  [place] and complains of left knee and hip pain.  EXAM: LEFT FEMUR - 2 VIEW  COMPARISON:  06/12/2014 left knee  FINDINGS: Degenerative changes at the patellofemoral compartment of the knee. Multiple surgical clips in the left hemipelvis region. Degenerative changes along the superior lateral left hip joint. Negative for a fracture or dislocation. Joint space narrowing and osteophytes in the medial left knee compartment.  IMPRESSION:  Negative for an acute fracture or dislocation in the left femur.  Degenerative changes at the left hip and left knee.   Electronically Signed   By: Markus Daft M.D.   On: 06/12/2014 10:20   Dg Knee Complete 4 Views Left  06/12/2014   CLINICAL DATA:  Patient fell ; pain after fall  EXAM: LEFT KNEE - COMPLETE 4+ VIEW  COMPARISON:  None.  FINDINGS: Frontal, lateral,  and bilateral oblique views were obtained. There is a small avulsion arising from the medial aspect of the medial proximal tibia just inferior to the plateau. The plateau itself does not appear disrupted. No other evidence of fracture. No dislocation. No appreciable joint effusion. There is moderate narrowing medially and in the patellofemoral joint. There is mild spurring in all compartments. Bones are somewhat osteoporotic. There are foci of arterial vascular calcification.  IMPRESSION: Small avulsion along the medial aspect of the proximal tibial epiphysis without involvement of the plateau surface. No other evidence of fracture. No dislocation. Moderate osteoarthritic change. No appreciable joint effusion.   Electronically Signed   By: Lowella Grip M.D.   On: 06/12/2014 10:21         Subjective: Patient denies fevers, chills, headache, chest pain, dyspnea, nausea, vomiting, diarrhea, abdominal pain, dysuria, hematuria   Objective: Filed Vitals:   06/20/14 2200 06/20/14 2257 06/20/14 2300 06/21/14 0556  BP: 147/121 135/81 134/81 126/66  Pulse:  95 89 70  Temp:    97.5 F (36.4 C)  TempSrc:    Oral  Resp: 20 19 20 20   SpO2:  99% 100% 96%    Intake/Output Summary (Last 24 hours) at 06/21/14 1029 Last data filed at 06/21/14 0500  Gross per 24 hour  Intake      0 ml  Output    100 ml  Net   -100 ml   Weight change:  Exam:   General:  Pt is alert, follows commands appropriately, not in acute distress   HEENT: No icterus, No thrush,  [place]/AT  Cardiovascular: IRRR, S1/S2, no rubs, no gallops  Respiratory: CTA bilaterally, no wheezing, no crackles, no rhonchi  Abdomen: Soft/+BS, non tender, non distended, no guarding  Extremities: No edema, No lymphangitis, No petechiae, No rashes, no synovitis  Data Reviewed: Basic Metabolic Panel:  Recent Labs Lab 06/20/14 1953 06/21/14 0505  NA 138 138  K 5.3 4.9  CL 102 105  CO2 22 22  GLUCOSE 126* 145*  BUN 52* 54*    CREATININE 1.63* 1.68*  CALCIUM 9.5 8.9   Liver Function Tests:  Recent Labs Lab 06/20/14 1953 06/21/14 0505  AST 18 15  ALT 17 14  ALKPHOS 88 74  BILITOT 0.6 0.6  PROT 6.9 6.0  ALBUMIN 3.3* 2.8*   No results for input(s): LIPASE, AMYLASE in the last 168 hours. No results for input(s): AMMONIA in the last 168 hours. CBC:  Recent Labs Lab 06/20/14 1953 06/21/14 0505  WBC 11.6* 8.5  NEUTROABS 9.0* 6.0  HGB 13.2 11.6*  HCT 40.9 36.7*  MCV 93.0 93.6  PLT 203 180   Cardiac Enzymes:  Recent Labs Lab 06/20/14 2239  CKTOTAL 153  CKMB 2.5  TROPONINI <0.30   BNP: Invalid input(s): POCBNP CBG: No results for input(s): GLUCAP in the last 168 hours.  Recent Results (from the past 240 hour(s))  MRSA PCR Screening     Status: None   Collection Time: 06/21/14  3:21 AM  Result Value Ref Range Status   MRSA by PCR  NEGATIVE NEGATIVE Final    Comment:        The GeneXpert MRSA Assay (FDA approved for NASAL specimens only), is one component of a comprehensive MRSA colonization surveillance program. It is not intended to diagnose MRSA infection nor to guide or monitor treatment for MRSA infections.      Scheduled Meds: . carvedilol  12.5 mg Oral BID WC  . docusate sodium  100 mg Oral QHS  . donepezil  10 mg Oral QHS  . DULoxetine  20 mg Oral Daily  . famotidine  20 mg Oral Daily  . sodium chloride  3 mL Intravenous Q12H   Continuous Infusions:    Terina Mcelhinny, DO  [facility] Pager [phone]  If 7PM-7AM, please contact night-coverage [email or web address] Password [email or web address] 06/21/2014, 10:29 AM   LOS: 1 day

## 2014-06-21 NOTE — Progress Notes (Signed)
Pt seen  Left hip fx No other ortho problems on history or exam inr 2.8 Vit k given Trop pending Plan or mon pm if labs ok Full consult to follow Talked with daughter

## 2014-06-22 ENCOUNTER — Inpatient Hospital Stay (HOSPITAL_COMMUNITY): Payer: Medicare Other | Admitting: Anesthesiology

## 2014-06-22 ENCOUNTER — Encounter (HOSPITAL_COMMUNITY): Payer: Self-pay | Admitting: Registered Nurse

## 2014-06-22 ENCOUNTER — Other Ambulatory Visit (HOSPITAL_COMMUNITY): Payer: Self-pay | Admitting: Orthopedic Surgery

## 2014-06-22 ENCOUNTER — Inpatient Hospital Stay (HOSPITAL_COMMUNITY): Payer: Medicare Other

## 2014-06-22 ENCOUNTER — Encounter (HOSPITAL_COMMUNITY)
Admission: EM | Disposition: A | Discharge status: Skilled Nursing Facility | Payer: Self-pay | Source: Home / Self Care | Attending: Internal Medicine

## 2014-06-22 DIAGNOSIS — I10 Essential (primary) hypertension: Secondary | ICD-10-CM

## 2014-06-22 HISTORY — PX: HIP ARTHROPLASTY: SHX981

## 2014-06-22 LAB — CBC
HEMATOCRIT: 36.6 % — AB (ref 39.0–52.0)
Hemoglobin: 11.5 g/dL — ABNORMAL LOW (ref 13.0–17.0)
MCH: 29.6 pg (ref 26.0–34.0)
MCHC: 31.4 g/dL (ref 30.0–36.0)
MCV: 94.1 fL (ref 78.0–100.0)
Platelets: 187 10*3/uL (ref 150–400)
RBC: 3.89 MIL/uL — AB (ref 4.22–5.81)
RDW: 14.4 % (ref 11.5–15.5)
WBC: 8.4 10*3/uL (ref 4.0–10.5)

## 2014-06-22 LAB — TYPE AND SCREEN
ABO/RH(D): A POS
ANTIBODY SCREEN: NEGATIVE

## 2014-06-22 LAB — BASIC METABOLIC PANEL
Anion gap: 9 (ref 5–15)
BUN: 56 mg/dL — AB (ref 6–23)
CHLORIDE: 106 meq/L (ref 96–112)
CO2: 24 mEq/L (ref 19–32)
CREATININE: 1.76 mg/dL — AB (ref 0.50–1.35)
Calcium: 8.9 mg/dL (ref 8.4–10.5)
GFR calc Af Amer: 37 mL/min — ABNORMAL LOW (ref >90)
GFR calc non Af Amer: 32 mL/min — ABNORMAL LOW (ref >90)
Glucose, Bld: 131 mg/dL — ABNORMAL HIGH (ref 70–99)
POTASSIUM: 5.1 meq/L (ref 3.7–5.3)
Sodium: 139 mEq/L (ref 137–147)

## 2014-06-22 LAB — URINE CULTURE: Colony Count: 50000

## 2014-06-22 LAB — ABO/RH: ABO/RH(D): A POS

## 2014-06-22 LAB — GLUCOSE, CAPILLARY: GLUCOSE-CAPILLARY: 106 mg/dL — AB (ref 70–99)

## 2014-06-22 LAB — PROTIME-INR
INR: 2 — ABNORMAL HIGH (ref 0.00–1.49)
INR: 1.53 — ABNORMAL HIGH (ref 0.00–1.49)
PROTHROMBIN TIME: 18.6 s — AB (ref 11.6–15.2)
Prothrombin Time: 22.9 seconds — ABNORMAL HIGH (ref 11.6–15.2)

## 2014-06-22 LAB — SURGICAL PCR SCREEN
MRSA, PCR: NEGATIVE
Staphylococcus aureus: POSITIVE — AB

## 2014-06-22 SURGERY — HEMIARTHROPLASTY, HIP, DIRECT ANTERIOR APPROACH, FOR FRACTURE
Anesthesia: General | Site: Hip | Laterality: Left

## 2014-06-22 MED ORDER — FENTANYL CITRATE 0.05 MG/ML IJ SOLN
INTRAMUSCULAR | Status: DC | PRN
  Administered 2014-06-22 (×4): 50 ug via INTRAVENOUS

## 2014-06-22 MED ORDER — EPHEDRINE SULFATE 50 MG/ML IJ SOLN
INTRAMUSCULAR | Status: AC
  Filled 2014-06-22: qty 1

## 2014-06-22 MED ORDER — CEFAZOLIN SODIUM-DEXTROSE 2-3 GM-% IV SOLR
2.0000 g | Freq: Once | INTRAVENOUS | Status: AC
  Administered 2014-06-22: 2 g via INTRAVENOUS

## 2014-06-22 MED ORDER — METOPROLOL TARTRATE 1 MG/ML IV SOLN
INTRAVENOUS | Status: DC | PRN
  Administered 2014-06-22 (×3): 1 mg via INTRAVENOUS

## 2014-06-22 MED ORDER — CEFAZOLIN SODIUM-DEXTROSE 2-3 GM-% IV SOLR
INTRAVENOUS | Status: AC
  Filled 2014-06-22: qty 50

## 2014-06-22 MED ORDER — PHENYLEPHRINE 40 MCG/ML (10ML) SYRINGE FOR IV PUSH (FOR BLOOD PRESSURE SUPPORT)
PREFILLED_SYRINGE | INTRAVENOUS | Status: AC
  Filled 2014-06-22: qty 10

## 2014-06-22 MED ORDER — GLYCOPYRROLATE 0.2 MG/ML IJ SOLN
INTRAMUSCULAR | Status: AC
  Filled 2014-06-22: qty 3

## 2014-06-22 MED ORDER — SODIUM CHLORIDE 0.9 % IV SOLN
INTRAVENOUS | Status: DC
  Administered 2014-06-22: 10:00:00 via INTRAVENOUS

## 2014-06-22 MED ORDER — PHENYLEPHRINE HCL 10 MG/ML IJ SOLN
INTRAMUSCULAR | Status: DC | PRN
  Administered 2014-06-22 (×2): 40 ug via INTRAVENOUS
  Administered 2014-06-22 (×2): 80 ug via INTRAVENOUS
  Administered 2014-06-22: 40 ug via INTRAVENOUS
  Administered 2014-06-22: 80 ug via INTRAVENOUS

## 2014-06-22 MED ORDER — HYDROCODONE-ACETAMINOPHEN 5-325 MG PO TABS
1.0000 | ORAL_TABLET | Freq: Four times a day (QID) | ORAL | Status: DC | PRN
  Administered 2014-06-23: 2 via ORAL
  Filled 2014-06-22: qty 2

## 2014-06-22 MED ORDER — ACETAMINOPHEN 650 MG RE SUPP
650.0000 mg | Freq: Four times a day (QID) | RECTAL | Status: DC | PRN
Start: 2014-06-22 — End: 2014-06-24

## 2014-06-22 MED ORDER — METOCLOPRAMIDE HCL 5 MG/ML IJ SOLN: 5.0000 mg | Freq: Three times a day (TID) | INTRAMUSCULAR | Status: DC | PRN

## 2014-06-22 MED ORDER — 0.9 % SODIUM CHLORIDE (POUR BTL) OPTIME
TOPICAL | Status: DC | PRN
  Administered 2014-06-22: 2000 mL

## 2014-06-22 MED ORDER — MENTHOL 3 MG MT LOZG
1.0000 | LOZENGE | OROMUCOSAL | Status: DC | PRN
  Filled 2014-06-22: qty 9

## 2014-06-22 MED ORDER — PROMETHAZINE HCL 25 MG/ML IJ SOLN: 6.2500 mg | INTRAMUSCULAR | Status: DC | PRN

## 2014-06-22 MED ORDER — ONDANSETRON HCL 4 MG/2ML IJ SOLN
INTRAMUSCULAR | Status: AC
  Filled 2014-06-22: qty 2

## 2014-06-22 MED ORDER — ONDANSETRON HCL 4 MG PO TABS
4.0000 mg | ORAL_TABLET | Freq: Four times a day (QID) | ORAL | Status: DC | PRN
Start: 2014-06-22 — End: 2014-06-24

## 2014-06-22 MED ORDER — LACTATED RINGERS IV SOLN
INTRAVENOUS | Status: DC
  Administered 2014-06-22: 20:00:00 via INTRAVENOUS
  Administered 2014-06-22: 1000 mL via INTRAVENOUS

## 2014-06-22 MED ORDER — LACTATED RINGERS IV SOLN: INTRAVENOUS | Status: DC

## 2014-06-22 MED ORDER — CISATRACURIUM BESYLATE (PF) 10 MG/5ML IV SOLN
INTRAVENOUS | Status: DC | PRN
  Administered 2014-06-22: 2 mg via INTRAVENOUS
  Administered 2014-06-22: 6 mg via INTRAVENOUS

## 2014-06-22 MED ORDER — PHYTONADIONE 5 MG PO TABS: 10.0000 mg | ORAL_TABLET | Freq: Once | ORAL | Status: DC

## 2014-06-22 MED ORDER — LIDOCAINE HCL (CARDIAC) 20 MG/ML IV SOLN
INTRAVENOUS | Status: DC | PRN
  Administered 2014-06-22: 60 mg via INTRAVENOUS

## 2014-06-22 MED ORDER — FENTANYL CITRATE 0.05 MG/ML IJ SOLN: 25.0000 ug | INTRAMUSCULAR | Status: DC | PRN

## 2014-06-22 MED ORDER — WARFARIN SODIUM 7.5 MG PO TABS
7.5000 mg | ORAL_TABLET | Freq: Once | ORAL | Status: DC
  Filled 2014-06-22: qty 1

## 2014-06-22 MED ORDER — PHENOL 1.4 % MT LIQD
1.0000 | OROMUCOSAL | Status: DC | PRN
  Filled 2014-06-22: qty 177

## 2014-06-22 MED ORDER — MEPERIDINE HCL 50 MG/ML IJ SOLN: 6.2500 mg | INTRAMUSCULAR | Status: DC | PRN

## 2014-06-22 MED ORDER — ACETAMINOPHEN 325 MG PO TABS: 650.0000 mg | ORAL_TABLET | Freq: Four times a day (QID) | ORAL | Status: DC | PRN

## 2014-06-22 MED ORDER — LIDOCAINE HCL (CARDIAC) 20 MG/ML IV SOLN
INTRAVENOUS | Status: AC
  Filled 2014-06-22: qty 5

## 2014-06-22 MED ORDER — PROPOFOL 10 MG/ML IV BOLUS
INTRAVENOUS | Status: AC
  Filled 2014-06-22: qty 20

## 2014-06-22 MED ORDER — CISATRACURIUM BESYLATE 20 MG/10ML IV SOLN
INTRAVENOUS | Status: AC
  Filled 2014-06-22: qty 10

## 2014-06-22 MED ORDER — SODIUM CHLORIDE 0.9 % IJ SOLN
INTRAMUSCULAR | Status: AC
  Filled 2014-06-22: qty 10

## 2014-06-22 MED ORDER — FENTANYL CITRATE 0.05 MG/ML IJ SOLN
INTRAMUSCULAR | Status: AC
  Filled 2014-06-22: qty 5

## 2014-06-22 MED ORDER — VITAMIN K1 10 MG/ML IJ SOLN
10.0000 mg | Freq: Once | INTRAVENOUS | Status: AC
  Administered 2014-06-22: 10 mg via INTRAVENOUS
  Filled 2014-06-22: qty 1

## 2014-06-22 MED ORDER — WARFARIN - PHARMACIST DOSING INPATIENT: Freq: Every day | Status: DC

## 2014-06-22 MED ORDER — ONDANSETRON HCL 4 MG/2ML IJ SOLN: 4.0000 mg | Freq: Four times a day (QID) | INTRAMUSCULAR | Status: DC | PRN

## 2014-06-22 MED ORDER — MORPHINE SULFATE 2 MG/ML IJ SOLN: 0.5000 mg | INTRAMUSCULAR | Status: DC | PRN

## 2014-06-22 MED ORDER — NEOSTIGMINE METHYLSULFATE 10 MG/10ML IV SOLN
INTRAVENOUS | Status: DC | PRN
  Administered 2014-06-22: 4 mg via INTRAVENOUS

## 2014-06-22 MED ORDER — CEFAZOLIN SODIUM-DEXTROSE 2-3 GM-% IV SOLR
2.0000 g | Freq: Four times a day (QID) | INTRAVENOUS | Status: AC
  Administered 2014-06-22 – 2014-06-23 (×2): 2 g via INTRAVENOUS
  Filled 2014-06-22 (×2): qty 50

## 2014-06-22 MED ORDER — DEXAMETHASONE SODIUM PHOSPHATE 10 MG/ML IJ SOLN
INTRAMUSCULAR | Status: DC | PRN
  Administered 2014-06-22: 5 mg via INTRAVENOUS

## 2014-06-22 MED ORDER — LACTATED RINGERS IV SOLN
INTRAVENOUS | Status: DC | PRN
  Administered 2014-06-22: 18:00:00 via INTRAVENOUS

## 2014-06-22 MED ORDER — METOCLOPRAMIDE HCL 10 MG PO TABS: 5.0000 mg | ORAL_TABLET | Freq: Three times a day (TID) | ORAL | Status: DC | PRN

## 2014-06-22 MED ORDER — PROPOFOL 10 MG/ML IV BOLUS
INTRAVENOUS | Status: DC | PRN
  Administered 2014-06-22: 80 mg via INTRAVENOUS

## 2014-06-22 MED ORDER — POTASSIUM CHLORIDE IN NACL 20-0.9 MEQ/L-% IV SOLN
INTRAVENOUS | Status: DC
Start: 2014-06-22 — End: 2014-06-23
  Administered 2014-06-22 – 2014-06-23 (×2): via INTRAVENOUS
  Filled 2014-06-22 (×2): qty 1000

## 2014-06-22 MED ORDER — ONDANSETRON HCL 4 MG/2ML IJ SOLN
INTRAMUSCULAR | Status: DC | PRN
  Administered 2014-06-22: 4 mg via INTRAVENOUS

## 2014-06-22 MED ORDER — GLYCOPYRROLATE 0.2 MG/ML IJ SOLN
INTRAMUSCULAR | Status: DC | PRN
  Administered 2014-06-22: 0.6 mg via INTRAVENOUS

## 2014-06-22 SURGICAL SUPPLY — 43 items
BAG ZIPLOCK 12X15 (MISCELLANEOUS) ×3 IMPLANT
BLADE SAW SAG 73X25 THK (BLADE) ×2
BLADE SAW SGTL 73X25 THK (BLADE) ×1 IMPLANT
BRUSH FEMORAL CANAL (MISCELLANEOUS) IMPLANT
CAPT HIP HEMI 2 ×3 IMPLANT
DRAPE INCISE IOBAN 66X45 STRL (DRAPES) ×3 IMPLANT
DRAPE ORTHO SPLIT 77X108 STRL (DRAPES) ×4
DRAPE POUCH INSTRU U-SHP 10X18 (DRAPES) ×3 IMPLANT
DRAPE SURG 17X11 SM STRL (DRAPES) ×3 IMPLANT
DRAPE SURG ORHT 6 SPLT 77X108 (DRAPES) ×2 IMPLANT
DRAPE U-SHAPE 47X51 STRL (DRAPES) ×3 IMPLANT
DRSG AQUACEL AG ADV 3.5X10 (GAUZE/BANDAGES/DRESSINGS) ×3 IMPLANT
DRSG EMULSION OIL 3X16 NADH (GAUZE/BANDAGES/DRESSINGS) ×3 IMPLANT
DRSG MEPILEX BORDER 4X8 (GAUZE/BANDAGES/DRESSINGS) ×6 IMPLANT
DRSG PAD ABDOMINAL 8X10 ST (GAUZE/BANDAGES/DRESSINGS) ×3 IMPLANT
DURAPREP 26ML APPLICATOR (WOUND CARE) ×3 IMPLANT
ELECT REM PT RETURN 9FT ADLT (ELECTROSURGICAL) ×3
ELECTRODE REM PT RTRN 9FT ADLT (ELECTROSURGICAL) ×1 IMPLANT
EVACUATOR 1/8 PVC DRAIN (DRAIN) IMPLANT
GAUZE SPONGE 4X4 12PLY STRL (GAUZE/BANDAGES/DRESSINGS) ×6 IMPLANT
GLOVE BIOGEL M 8.0 STRL (GLOVE) ×3 IMPLANT
GLOVE SURG ORTHO 8.0 STRL STRW (GLOVE) ×3 IMPLANT
GLOVE SURG ORTHO 9.0 STRL STRW (GLOVE) ×6 IMPLANT
GOWN STRL REUS W/TWL LRG LVL3 (GOWN DISPOSABLE) ×3 IMPLANT
HANDPIECE INTERPULSE COAX TIP (DISPOSABLE)
HOOD PEEL AWAY FACE SHEILD DIS (HOOD) ×6 IMPLANT
IMMOBILIZER KNEE 20 (SOFTGOODS)
IMMOBILIZER KNEE 20 THIGH 36 (SOFTGOODS) IMPLANT
KIT BASIN OR (CUSTOM PROCEDURE TRAY) ×3 IMPLANT
NEEDLE MAYO .5 CIRCLE (NEEDLE) IMPLANT
PACK TOTAL JOINT (CUSTOM PROCEDURE TRAY) ×3 IMPLANT
PASSER SUT SWANSON 36MM LOOP (INSTRUMENTS) ×3 IMPLANT
POSITIONER SURGICAL ARM (MISCELLANEOUS) ×3 IMPLANT
SET HNDPC FAN SPRY TIP SCT (DISPOSABLE) IMPLANT
STAPLER VISISTAT 35W (STAPLE) ×3 IMPLANT
SUT ETHIBOND NAB CT1 #1 30IN (SUTURE) ×15 IMPLANT
SUT PDS AB 1 CT1 27 (SUTURE) ×6 IMPLANT
SUT VIC AB 0 CT1 36 (SUTURE) ×6 IMPLANT
SUT VIC AB 1 CT1 36 (SUTURE) ×6 IMPLANT
SUT VIC AB 2-0 CT1 27 (SUTURE) ×6
SUT VIC AB 2-0 CT1 TAPERPNT 27 (SUTURE) ×3 IMPLANT
TOWEL OR 17X26 10 PK STRL BLUE (TOWEL DISPOSABLE) ×9 IMPLANT
TOWER CARTRIDGE SMART MIX (DISPOSABLE) IMPLANT

## 2014-06-22 NOTE — Progress Notes (Signed)
Attempted coude 18Fr and met resistance and had some blood clots with it. Put condom cath on patient. Told patient that we would let the MD know. Will continue to monitor patient. Patient is in no distress. LUTTERLOH, KIMBERLY D  

## 2014-06-22 NOTE — Progress Notes (Signed)
Patient blood pressure 180/108, patient in no distress, notified Dr. Carles Collet who stated not to give the PRN hydralazine because patient will be going to surgery soon. Will continue to monitor.

## 2014-06-22 NOTE — Transfer of Care (Signed)
Immediate Anesthesia Transfer of Care Note  Patient: Kurt Thomas  Procedure(s) Performed: Procedure(s) (LRB): LEFT HEMIARTHROPLASTY HIP (Left)  Patient Location: PACU  Anesthesia Type: General  Level of Consciousness: sedated, patient cooperative and responds to stimulation  Airway & Oxygen Therapy: Patient Spontanous Breathing and Patient connected to face mask oxgen  Post-op Assessment: Report given to PACU RN and Post -op Vital signs reviewed and stable  Post vital signs: Reviewed and stable  Complications: No apparent anesthesia complications

## 2014-06-22 NOTE — Progress Notes (Addendum)
Attempted a 16Fr cath and met resistance, and had some blood when pulled cath out. Paged Np for a coude cath. Will continue to monitor the patient. Philemon Kingdom D

## 2014-06-22 NOTE — Progress Notes (Signed)
ANTICOAGULATION CONSULT NOTE - Initial Consult  Pharmacy Consult for Coumadin Indication: VTE prophylaxis, A.fib  No Known Allergies  Patient Measurements: Weight: 185 lb 3 oz (84 kg)  Vital Signs: Temp: 97.9 F (36.6 C) (12/14 2138) Temp Source: Oral (12/14 1507) BP: 191/108 mmHg (12/14 2138) Pulse Rate: 99 (12/14 2138)  Labs:  Recent Labs  06/20/14 1953 06/20/14 2239 06/21/14 0505 06/22/14 0523 06/22/14 1600  HGB 13.2  --  11.6* 11.5*  --   HCT 40.9  --  36.7* 36.6*  --   PLT 203  --  180 187  --   LABPROT 27.7*  --  28.7* 22.9* 18.6*  INR 2.56*  --  2.68* 2.00* 1.53*  CREATININE 1.63*  --  1.68* 1.76*  --   CKTOTAL  --  153  --   --   --   CKMB  --  2.5  --   --   --   TROPONINI  --  <0.30  --   --   --     Estimated Creatinine Clearance: 27.9 mL/min (by C-G formula based on Cr of 1.76).   Medical History: Past Medical History  Diagnosis Date  . High blood pressure   . Memory loss   . Low back pain   . Stroke   . Atrial fibrillation   . Prostate cancer   . CAD (coronary artery disease)     Medications:  Prescriptions prior to admission  Medication Sig Dispense Refill Last Dose  . carvedilol (COREG) 12.5 MG tablet Take 12.5 mg by mouth 2 (two) times daily with a meal.   06/20/2014 at 0700  . docusate sodium (COLACE) 100 MG capsule Take 100 mg by mouth at bedtime.   06/19/2014 at Unknown time  . donepezil (ARICEPT) 10 MG tablet Take 10 mg by mouth at bedtime.   06/19/2014 at Unknown time  . DULoxetine (CYMBALTA) 20 MG capsule Take 1 capsule (20 mg total) by mouth daily. 30 capsule 11 06/20/2014 at Unknown time  . enalapril (VASOTEC) 20 MG tablet Take 20 mg by mouth 2 (two) times daily.    06/20/2014 at Unknown time  . ranitidine (ZANTAC) 150 MG capsule Take 150 mg by mouth daily with breakfast.    06/20/2014 at Unknown time  . traMADol (ULTRAM) 50 MG tablet Take 50 mg by mouth 4 (four) times daily as needed for moderate pain or severe pain (pain).    06/19/2014 at Unknown time  . warfarin (COUMADIN) 5 MG tablet Take 5 mg by mouth daily.   06/19/2014 at 1900  . albuterol (PROVENTIL) (2.5 MG/3ML) 0.083% nebulizer solution Take 2.5 mg by nebulization every 6 (six) hours as needed for wheezing or shortness of breath.   unknown at unknown time  . Camphor-Eucalyptus-Menthol (VICKS VAPORUB) 4.7-1.2-2.6 % OINT Apply 1 application topically at bedtime as needed (for congestion).   unknown at unknown time  . Menthol, Topical Analgesic, (ICY HOT EX) Apply 1 application topically every 8 (eight) hours as needed (for pain).   unknown at unknown time  . promethazine-codeine (PHENERGAN WITH CODEINE) 6.25-10 MG/5ML syrup Take 5 mLs by mouth 4 (four) times daily as needed for cough.   unknown at unknown time  . sodium chloride (OCEAN) 0.65 % SOLN nasal spray Place 2 sprays into both nostrils 4 (four) times daily as needed (for dry nose).    unknown at unknown time    Assessment: 80yo M admitted 12/12 with hip fracture after falling. Vit. K was given on  12/12, 2/13, and 12/14(total of 17.5mg ) to bring the INR down. On 12/14 the INR was 1.53 and hemiarthroplasty was performed. Pharmacy is asked to resume Coumadin.   Goal of Therapy:  INR 2-3 Monitor platelets by anticoagulation protocol: Yes   Plan:   Give Coumadin 7.5mg  tonight.  Check PT/INR daily.  Because of recent Vit. K, there is likely to be resistance to Coumadin for up to a week. If the INR isn't close to therapeutic in 1-2 days, consider Lovenox bridging.  Romeo Rabon, PharmD, pager [phone]. 06/22/2014,9:58 PM.

## 2014-06-22 NOTE — Anesthesia Preprocedure Evaluation (Signed)
Anesthesia Evaluation  Patient identified by MRN, date of birth, ID band Patient awake    Reviewed: Allergy & Precautions, H&P , NPO status , Patient's Chart, lab work & pertinent test results  Airway Mallampati: II  TM Distance: >3 FB Neck ROM: Full    Dental no notable dental hx. (+) Poor Dentition   Pulmonary former smoker,  breath sounds clear to auscultation  Pulmonary exam normal       Cardiovascular hypertension, Pt. on medications + CAD and + CABG + dysrhythmias Atrial Fibrillation Rhythm:Regular Rate:Normal     Neuro/Psych dementia CVA, Residual Symptoms negative psych ROS   GI/Hepatic negative GI ROS, Neg liver ROS,   Endo/Other  negative endocrine ROS  Renal/GU negative Renal ROS  negative genitourinary   Musculoskeletal negative musculoskeletal ROS (+)   Abdominal   Peds negative pediatric ROS (+)  Hematology negative hematology ROS (+)   Anesthesia Other Findings   Reproductive/Obstetrics negative OB ROS                             Anesthesia Physical Anesthesia Plan  ASA: III  Anesthesia Plan: General   Post-op Pain Management:    Induction: Intravenous  Airway Management Planned: Oral ETT  Additional Equipment:   Intra-op Plan:   Post-operative Plan: Extubation in OR  Informed Consent: I have reviewed the patients History and Physical, chart, labs and discussed the procedure including the risks, benefits and alternatives for the proposed anesthesia with the patient or authorized representative who has indicated his/her understanding and acceptance.   Dental advisory given  Plan Discussed with: CRNA  Anesthesia Plan Comments:         Anesthesia Quick Evaluation

## 2014-06-22 NOTE — Brief Op Note (Signed)
06/20/2014 - 06/22/2014  8:19 PM  PATIENT:  Conception Oms  80 y.o. male  PRE-OPERATIVE DIAGNOSIS:  LEFT HIP FRACTURE  POST-OPERATIVE DIAGNOSIS:  LEFT HIP FRACTURE  PROCEDURE:  Procedure(s): LEFT HEMIARTHROPLASTY HIP  SURGEON:  Surgeon(s): Meredith Pel, MD  ASSISTANT: none  ANESTHESIA:   general  EBL:  150 ml    Total I/O In: 9417 [I.V.:1000; Blood:311] Out: 200 [Blood:200]  BLOOD ADMINISTERED: none  DRAINS: none   LOCAL MEDICATIONS USED:  none  SPECIMEN:  No Specimen  COUNTS:  YES  TOURNIQUET:  * No tourniquets in log *  DICTATION: .Other Dictation: Dictation Number [id number]  PLAN OF CARE: Admit to inpatient   PATIENT DISPOSITION:  PACU - hemodynamically stable

## 2014-06-22 NOTE — Consult Note (Signed)
Reason for Consult: Left hip pain Referring Physician: Dr. Carles Collet  Kurt Thomas is an 80 y.o. male.  HPI: Kurt Thomas is an ambulatory 80 year old patient resident of assisted living facility had a mechanical fall 2 days ago. Presents now for operative management of left femoral neck fracture displaced. Patient does ambulate with a cane and only recently started using a walker. He has significant past medical history including prostate cancer coronary artery disease early memory loss. Denies any other orthopedic complaints associated with this fall.  Past Medical History  Diagnosis Date  . High blood pressure   . Memory loss   . Low back pain   . Stroke   . Atrial fibrillation   . Prostate cancer   . CAD (coronary artery disease)     Past Surgical History  Procedure Laterality Date  . Coronary artery bypass graft    . Prostatectomy    . Cataract extraction      Family History  Problem Relation Age of Onset  . High blood pressure    . Cancer Father     Social History:  reports that he quit smoking about 57 years ago. His smoking use included Cigarettes. He has a 1 pack-year smoking history. He has never used smokeless tobacco. He reports that he does not drink alcohol or use illicit drugs.  Allergies: No Known Allergies  Medications: I have reviewed the patient's current medications.  Results for orders placed or performed during the hospital encounter of 06/20/14 (from the past 48 hour(s))  CBC with Differential     Status: Abnormal   Collection Time: 06/20/14  7:53 PM  Result Value Ref Range   WBC 11.6 (H) 4.0 - 10.5 K/uL   RBC 4.40 4.22 - 5.81 MIL/uL   Hemoglobin 13.2 13.0 - 17.0 g/dL   HCT 40.9 39.0 - 52.0 %   MCV 93.0 78.0 - 100.0 fL   MCH 30.0 26.0 - 34.0 pg   MCHC 32.3 30.0 - 36.0 g/dL   RDW 14.4 11.5 - 15.5 %   Platelets 203 150 - 400 K/uL   Neutrophils Relative % 78 (H) 43 - 77 %   Neutro Abs 9.0 (H) 1.7 - 7.7 K/uL   Lymphocytes Relative 9 (L) 12 - 46 %   Lymphs Abs 1.1  0.7 - 4.0 K/uL   Monocytes Relative 12 3 - 12 %   Monocytes Absolute 1.4 (H) 0.1 - 1.0 K/uL   Eosinophils Relative 1 0 - 5 %   Eosinophils Absolute 0.1 0.0 - 0.7 K/uL   Basophils Relative 0 0 - 1 %   Basophils Absolute 0.0 0.0 - 0.1 K/uL  Comprehensive metabolic panel     Status: Abnormal   Collection Time: 06/20/14  7:53 PM  Result Value Ref Range   Sodium 138 137 - 147 mEq/L   Potassium 5.3 3.7 - 5.3 mEq/L   Chloride 102 96 - 112 mEq/L   CO2 22 19 - 32 mEq/L   Glucose, Bld 126 (H) 70 - 99 mg/dL   BUN 52 (H) 6 - 23 mg/dL   Creatinine, Ser 1.63 (H) 0.50 - 1.35 mg/dL   Calcium 9.5 8.4 - 10.5 mg/dL   Total Protein 6.9 6.0 - 8.3 g/dL   Albumin 3.3 (L) 3.5 - 5.2 g/dL   AST 18 0 - 37 U/L   ALT 17 0 - 53 U/L   Alkaline Phosphatase 88 39 - 117 U/L   Total Bilirubin 0.6 0.3 - 1.2 mg/dL   GFR calc  non Af Amer 35 (L) >90 mL/min   GFR calc Af Amer 40 (L) >90 mL/min    Comment: (NOTE) The eGFR has been calculated using the CKD EPI equation. This calculation has not been validated in all clinical situations. eGFR's persistently <90 mL/min signify possible Chronic Kidney Disease.    Anion gap 14 5 - 15  Protime-INR     Status: Abnormal   Collection Time: 06/20/14  7:53 PM  Result Value Ref Range   Prothrombin Time 27.7 (H) 11.6 - 15.2 seconds   INR 2.56 (H) 0.00 - 1.49   CK total and CKMB (cardiac)     Status: None   Collection Time: 06/20/14 10:39 PM  Result Value Ref Range   Total CK 153 7 - 232 U/L   CK, MB 2.5 0.3 - 4.0 ng/mL   Relative Index 1.6 0.0 - 2.5    Comment: Performed at [facility]  Troponin I     Status: None   Collection Time: 06/20/14 10:39 PM  Result Value Ref Range   Troponin I <0.30 <0.30 ng/mL    Comment:        Due to the release kinetics of cTnI, a negative result within the first hours of the onset of symptoms does not rule out myocardial infarction with certainty. If myocardial infarction is still suspected, repeat the test at appropriate  intervals.   Urinalysis, Routine w reflex microscopic     Status: Abnormal   Collection Time: 06/21/14  2:50 AM  Result Value Ref Range   Color, Urine YELLOW YELLOW   APPearance CLOUDY (A) CLEAR   Specific Gravity, Urine 1.017 1.005 - 1.030   pH 6.0 5.0 - 8.0   Glucose, UA NEGATIVE NEGATIVE mg/dL   Hgb urine dipstick LARGE (A) NEGATIVE   Bilirubin Urine NEGATIVE NEGATIVE   Ketones, ur NEGATIVE NEGATIVE mg/dL   Protein, ur NEGATIVE NEGATIVE mg/dL   Urobilinogen, UA 0.2 0.0 - 1.0 mg/dL   Nitrite NEGATIVE NEGATIVE   Leukocytes, UA LARGE (A) NEGATIVE  Urine culture     Status: None   Collection Time: 06/21/14  2:50 AM  Result Value Ref Range   Specimen Description URINE, CLEAN CATCH    Special Requests NONE    Culture  Setup Time      06/21/2014 11:19 Performed at [facility]      50,000 COLONIES/ML Performed at [facility]      Multiple bacterial morphotypes present, none predominant. Suggest appropriate recollection if clinically indicated. Performed at [facility]    Report Status 06/22/2014 FINAL   Urine microscopic-add on     Status: Abnormal   Collection Time: 06/21/14  2:50 AM  Result Value Ref Range   Squamous Epithelial / LPF RARE RARE   WBC, UA 7-10 <3 WBC/hpf   RBC / HPF 21-50 <3 RBC/hpf   Bacteria, UA FEW (A) RARE   Casts GRANULAR CAST (A) NEGATIVE  MRSA PCR Screening     Status: None   Collection Time: 06/21/14  3:21 AM  Result Value Ref Range   MRSA by PCR NEGATIVE NEGATIVE    Comment:        The GeneXpert MRSA Assay (FDA approved for NASAL specimens only), is one component of a comprehensive MRSA colonization surveillance program. It is not intended to diagnose MRSA infection nor to guide or monitor treatment for MRSA infections.   CBC with Differential  Status: Abnormal   Collection Time: 06/21/14  5:05 AM  Result Value Ref Range   WBC 8.5 4.0 - 10.5 K/uL   RBC 3.92 (L) 4.22 - 5.81  MIL/uL   Hemoglobin 11.6 (L) 13.0 - 17.0 g/dL   HCT 55.7 (L) 13.2 - 27.3 %   MCV 93.6 78.0 - 100.0 fL   MCH 29.6 26.0 - 34.0 pg   MCHC 31.6 30.0 - 36.0 g/dL   RDW 89.2 60.1 - 81.5 %   Platelets 180 150 - 400 K/uL   Neutrophils Relative % 71 43 - 77 %   Neutro Abs 6.0 1.7 - 7.7 K/uL   Lymphocytes Relative 14 12 - 46 %   Lymphs Abs 1.2 0.7 - 4.0 K/uL   Monocytes Relative 13 (H) 3 - 12 %   Monocytes Absolute 1.1 (H) 0.1 - 1.0 K/uL   Eosinophils Relative 2 0 - 5 %   Eosinophils Absolute 0.2 0.0 - 0.7 K/uL   Basophils Relative 0 0 - 1 %   Basophils Absolute 0.0 0.0 - 0.1 K/uL  Comprehensive metabolic panel     Status: Abnormal   Collection Time: 06/21/14  5:05 AM  Result Value Ref Range   Sodium 138 137 - 147 mEq/L   Potassium 4.9 3.7 - 5.3 mEq/L   Chloride 105 96 - 112 mEq/L   CO2 22 19 - 32 mEq/L   Glucose, Bld 145 (H) 70 - 99 mg/dL   BUN 54 (H) 6 - 23 mg/dL   Creatinine, Ser 1.90 (H) 0.50 - 1.35 mg/dL   Calcium 8.9 8.4 - 41.4 mg/dL   Total Protein 6.0 6.0 - 8.3 g/dL   Albumin 2.8 (L) 3.5 - 5.2 g/dL   AST 15 0 - 37 U/L   ALT 14 0 - 53 U/L   Alkaline Phosphatase 74 39 - 117 U/L   Total Bilirubin 0.6 0.3 - 1.2 mg/dL   GFR calc non Af Amer 33 (L) >90 mL/min   GFR calc Af Amer 39 (L) >90 mL/min    Comment: (NOTE) The eGFR has been calculated using the CKD EPI equation. This calculation has not been validated in all clinical situations. eGFR's persistently <90 mL/min signify possible Chronic Kidney Disease.    Anion gap 11 5 - 15  Protime-INR     Status: Abnormal   Collection Time: 06/21/14  5:05 AM  Result Value Ref Range   Prothrombin Time 28.7 (H) 11.6 - 15.2 seconds   INR 2.68 (H) 0.00 - 1.49  Protime-INR     Status: Abnormal   Collection Time: 06/22/14  5:23 AM  Result Value Ref Range   Prothrombin Time 22.9 (H) 11.6 - 15.2 seconds   INR 2.00 (H) 0.00 - 1.49  Basic metabolic panel     Status: Abnormal   Collection Time: 06/22/14  5:23 AM  Result Value Ref Range    Sodium 139 137 - 147 mEq/L   Potassium 5.1 3.7 - 5.3 mEq/L   Chloride 106 96 - 112 mEq/L   CO2 24 19 - 32 mEq/L   Glucose, Bld 131 (H) 70 - 99 mg/dL   BUN 56 (H) 6 - 23 mg/dL   Creatinine, Ser 3.15 (H) 0.50 - 1.35 mg/dL   Calcium 8.9 8.4 - 11.2 mg/dL   GFR calc non Af Amer 32 (L) >90 mL/min   GFR calc Af Amer 37 (L) >90 mL/min    Comment: (NOTE) The eGFR has been calculated using the CKD EPI equation. This  calculation has not been validated in all clinical situations. eGFR's persistently <90 mL/min signify possible Chronic Kidney Disease.    Anion gap 9 5 - 15  CBC     Status: Abnormal   Collection Time: 06/22/14  5:23 AM  Result Value Ref Range   WBC 8.4 4.0 - 10.5 K/uL   RBC 3.89 (L) 4.22 - 5.81 MIL/uL   Hemoglobin 11.5 (L) 13.0 - 17.0 g/dL   HCT 36.6 (L) 39.0 - 52.0 %   MCV 94.1 78.0 - 100.0 fL   MCH 29.6 26.0 - 34.0 pg   MCHC 31.4 30.0 - 36.0 g/dL   RDW 14.4 11.5 - 15.5 %   Platelets 187 150 - 400 K/uL  Protime-INR     Status: Abnormal   Collection Time: 06/22/14  4:00 PM  Result Value Ref Range   Prothrombin Time 18.6 (H) 11.6 - 15.2 seconds   INR 1.53 (H) 0.00 - 1.49    Dg Chest 1 View  06/20/2014   CLINICAL DATA:  Recent fall with chest pain an known left femoral fracture  EXAM: CHEST - 1 VIEW  COMPARISON:  None.  FINDINGS: Cardiac shadow is enlarged. Postsurgical changes are seen. A few calcified granulomas are noted. No focal infiltrate or sizable effusion is seen. No bony abnormality is noted.  IMPRESSION: No acute abnormality noted.   Electronically Signed   By: Inez Catalina M.D.   On: 06/20/2014 19:06   Dg Lumbar Spine Complete  06/20/2014   CLINICAL DATA:  Fall today with low back pain  EXAM: LUMBAR SPINE - COMPLETE 4+ VIEW  COMPARISON:  None.  FINDINGS: Five lumbar type vertebral bodies are well visualized. Vertebral body height is well maintained. No spondylolysis or spondylolisthesis is seen. Mild disc space narrowing is noted at L5-S1. No gross soft tissue  abnormality is seen.  IMPRESSION: No acute abnormality noted.   Electronically Signed   By: Inez Catalina M.D.   On: 06/20/2014 19:05   Dg Hip Complete Left  06/20/2014   CLINICAL DATA:  Fall today with left hip pain, initial encounter  EXAM: LEFT HIP - COMPLETE 2+ VIEW  COMPARISON:  None.  FINDINGS: A subcapital left femoral neck fracture is noted with impaction and angulation at the fracture site. The pelvic ring is intact. No other focal abnormality is seen.  IMPRESSION: Left subcapital femoral neck fracture.   Electronically Signed   By: Inez Catalina M.D.   On: 06/20/2014 19:04    Review of Systems  Constitutional: Negative.   HENT: Negative.   Eyes: Negative.   Respiratory: Negative.   Cardiovascular: Negative.   Gastrointestinal: Negative.   Genitourinary: Negative.   Musculoskeletal: Positive for joint pain.  Skin: Negative.   Neurological: Negative.   Endo/Heme/Allergies: Negative.   Psychiatric/Behavioral: Positive for memory loss.   Blood pressure 186/108, pulse 81, temperature 97.9 F (36.6 C), temperature source Oral, resp. rate 20, weight 84 kg (185 lb 3 oz), SpO2 99 %. Physical Exam  Constitutional: He appears well-developed.  HENT:  Head: Normocephalic.  Eyes: Pupils are equal, round, and reactive to light.  Neck: Normal range of motion.  Cardiovascular: Normal rate.   Respiratory: Effort normal.  Neurological: He is alert.  Skin: Skin is warm.  Psychiatric: He has a normal mood and affect.   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 He will receive FFP during surgery I did discuss the case with his daughter Jackelyn Poling via telephone. All questions answered. Risk and benefits of surgery discussed included relative to infection leg length inequality nerve vessel damage possibility of dislocation QUESTIONS answered  Yardley Lekas SCOTT 06/22/2014, 5:51 PM

## 2014-06-22 NOTE — Progress Notes (Signed)
PROGRESS NOTE  Kurt Thomas KJZ:[id number] DOB: 10/29/1918 DOA: 06/20/2014 PCP:  Melinda Crutch, MD Brief history  80 year old male with a history of paroxysmal fibrillation, hypertension, dementia presents with a mechanical fall. The patient is a poor historian due to his history of dementia. His history is obtained from review of the chart and speaking with the patient's daughter at the bedside. Apparently the patient fell out of his chair onto his left side and was unable to get up. At baseline, the patient usually ambulates with cane; however, the patient had another mechanical fall on 06/12/2014 after which he began using a walker. At baseline, the patient is pleasant and confused but is able to carry on a conversation without difficulty. There was no history of syncope, vomiting, diarrhea, chest pain, shortness of breath. Assessment/Plan: Left Subcapital Femoral Neck Fracture -Orthopedic surgery has been consulted--I spoke with Dr. Marlou Sa -pain control -PT/OT after surgery -At this time, the patient is medically stable for surgery once his INR is corrected -additional vitamin K 10mg  IV given this am -Dr. Marlou Sa still with plans for OR today--->FFP atrial fibrillation -Presently rate controlled -Continue carvedilol -Hold Coumadin in preparation for surgery -will need start heparin drip after surgery when OKAY with orthopedic surg. Renal insufficiency -Unclear what the patient's previous renal baseline, but suspect he has CKD -Continue to monitor -started IVF -BMP in am Hypertension -Continue carvedilol -Hold ACEi as his K is trending up Dementia -Continue Aricept     Family Communication:   Daughter updated on phone Disposition Plan:   Home when medically stable       Procedures/Studies: Dg Chest 1 View  06/20/2014   CLINICAL DATA:  Recent fall with chest pain an known left femoral fracture  EXAM: CHEST - 1 VIEW  COMPARISON:  None.  FINDINGS: Cardiac shadow is  enlarged. Postsurgical changes are seen. A few calcified granulomas are noted. No focal infiltrate or sizable effusion is seen. No bony abnormality is noted.  IMPRESSION: No acute abnormality noted.   Electronically Signed   By: Inez Catalina M.D.   On: 06/20/2014 19:06   Dg Lumbar Spine Complete  06/20/2014   CLINICAL DATA:  Fall today with low back pain  EXAM: LUMBAR SPINE - COMPLETE 4+ VIEW  COMPARISON:  None.  FINDINGS: Five lumbar type vertebral bodies are well visualized. Vertebral body height is well maintained. No spondylolysis or spondylolisthesis is seen. Mild disc space narrowing is noted at L5-S1. No gross soft tissue abnormality is seen.  IMPRESSION: No acute abnormality noted.   Electronically Signed   By: Inez Catalina M.D.   On: 06/20/2014 19:05   Dg Hip Complete Left  06/20/2014   CLINICAL DATA:  Fall today with left hip pain, initial encounter  EXAM: LEFT HIP - COMPLETE 2+ VIEW  COMPARISON:  None.  FINDINGS: A subcapital left femoral neck fracture is noted with impaction and angulation at the fracture site. The pelvic ring is intact. No other focal abnormality is seen.  IMPRESSION: Left subcapital femoral neck fracture.   Electronically Signed   By: Inez Catalina M.D.   On: 06/20/2014 19:04   Dg Femur Left  06/12/2014   CLINICAL DATA:  [place] and complains of left knee and hip pain.  EXAM: LEFT FEMUR - 2 VIEW  COMPARISON:  06/12/2014 left knee  FINDINGS: Degenerative changes at the patellofemoral compartment of the knee. Multiple surgical clips in the left hemipelvis region. Degenerative changes along the superior  lateral left hip joint. Negative for a fracture or dislocation. Joint space narrowing and osteophytes in the medial left knee compartment.  IMPRESSION: Negative for an acute fracture or dislocation in the left femur.  Degenerative changes at the left hip and left knee.   Electronically Signed   By: Markus Daft M.D.   On: 06/12/2014 10:20   Dg Knee Complete 4 Views Left  06/12/2014    CLINICAL DATA:  Patient fell ; pain after fall  EXAM: LEFT KNEE - COMPLETE 4+ VIEW  COMPARISON:  None.  FINDINGS: Frontal, lateral, and bilateral oblique views were obtained. There is a small avulsion arising from the medial aspect of the medial proximal tibia just inferior to the plateau. The plateau itself does not appear disrupted. No other evidence of fracture. No dislocation. No appreciable joint effusion. There is moderate narrowing medially and in the patellofemoral joint. There is mild spurring in all compartments. Bones are somewhat osteoporotic. There are foci of arterial vascular calcification.  IMPRESSION: Small avulsion along the medial aspect of the proximal tibial epiphysis without involvement of the plateau surface. No other evidence of fracture. No dislocation. Moderate osteoarthritic change. No appreciable joint effusion.   Electronically Signed   By: Lowella Grip M.D.   On: 06/12/2014 10:21         Subjective:  patient is more alert today. He complains of some pain in his left hip. Otherwise he denies any fevers, chills, chest pain, shortness breath, nausea, vomiting, diarrhea, abdominal pain.  Objective: Filed Vitals:   06/21/14 2050 06/22/14 0535 06/22/14 0735 06/22/14 1507  BP: 156/69 146/92 160/72 186/108  Pulse: 92 89 67 81  Temp: 98 F (36.7 C) 97.5 F (36.4 C)  97.9 F (36.6 C)  TempSrc: Oral Oral  Oral  Resp: 20 20  20   Weight:  84 kg (185 lb 3 oz)    SpO2: 97% 97%  99%    Intake/Output Summary (Last 24 hours) at 06/22/14 1613 Last data filed at 06/22/14 1500  Gross per 24 hour  Intake    123 ml  Output      0 ml  Net    123 ml   Weight change:  Exam:   General:  Pt is alert, follows commands appropriately, not in acute distress  HEENT: No icterus, No thrush,  [place]/AT  Cardiovascular: IRRR, S1/S2, no rubs, no gallops  Respiratory: diminished breath sounds at the bases but clear to auscultation  Abdomen: Soft/+BS, non tender, non distended,  no guarding  Extremities: No edema, No lymphangitis, No petechiae, No rashes, no synovitis  Data Reviewed: Basic Metabolic Panel:  Recent Labs Lab 06/20/14 1953 06/21/14 0505 06/22/14 0523  NA 138 138 139  K 5.3 4.9 5.1  CL 102 105 106  CO2 22 22 24   GLUCOSE 126* 145* 131*  BUN 52* 54* 56*  CREATININE 1.63* 1.68* 1.76*  CALCIUM 9.5 8.9 8.9   Liver Function Tests:  Recent Labs Lab 06/20/14 1953 06/21/14 0505  AST 18 15  ALT 17 14  ALKPHOS 88 74  BILITOT 0.6 0.6  PROT 6.9 6.0  ALBUMIN 3.3* 2.8*   No results for input(s): LIPASE, AMYLASE in the last 168 hours. No results for input(s): AMMONIA in the last 168 hours. CBC:  Recent Labs Lab 06/20/14 1953 06/21/14 0505 06/22/14 0523  WBC 11.6* 8.5 8.4  NEUTROABS 9.0* 6.0  --   HGB 13.2 11.6* 11.5*  HCT 40.9 36.7* 36.6*  MCV 93.0 93.6 94.1  PLT 203  180 187   Cardiac Enzymes:  Recent Labs Lab 06/20/14 2239  CKTOTAL 153  CKMB 2.5  TROPONINI <0.30   BNP: Invalid input(s): POCBNP CBG: No results for input(s): GLUCAP in the last 168 hours.  Recent Results (from the past 240 hour(s))  Urine culture     Status: None   Collection Time: 06/21/14  2:50 AM  Result Value Ref Range Status   Specimen Description URINE, CLEAN CATCH  Final   Special Requests NONE  Final   Culture  Setup Time   Final    06/21/2014 11:19 Performed at [facility]   Final    50,000 COLONIES/ML Performed at [facility]    Culture   Final    Multiple bacterial morphotypes present, none predominant. Suggest appropriate recollection if clinically indicated. Performed at [facility]    Report Status 06/22/2014 FINAL  Final  MRSA PCR Screening     Status: None   Collection Time: 06/21/14  3:21 AM  Result Value Ref Range Status   MRSA by PCR NEGATIVE NEGATIVE Final    Comment:        The GeneXpert MRSA Assay (FDA approved for NASAL specimens only), is one component of a comprehensive  MRSA colonization surveillance program. It is not intended to diagnose MRSA infection nor to guide or monitor treatment for MRSA infections.      Scheduled Meds: . carvedilol  12.5 mg Oral BID WC  . docusate sodium  100 mg Oral QHS  . donepezil  10 mg Oral QHS  . sodium chloride  3 mL Intravenous Q12H   Continuous Infusions: . sodium chloride 75 mL/hr at 06/22/14 0956     Shayla Heming, DO  [facility] Pager [phone]  If 7PM-7AM, please contact night-coverage [email or web address] Password [email or web address] 06/22/2014, 4:13 PM   LOS: 2 days

## 2014-06-23 ENCOUNTER — Encounter (HOSPITAL_COMMUNITY): Payer: Self-pay | Admitting: Orthopedic Surgery

## 2014-06-23 DIAGNOSIS — S72002D Fracture of unspecified part of neck of left femur, subsequent encounter for closed fracture with routine healing: Secondary | ICD-10-CM

## 2014-06-23 LAB — CBC
HCT: 32.7 % — ABNORMAL LOW (ref 39.0–52.0)
Hemoglobin: 10.4 g/dL — ABNORMAL LOW (ref 13.0–17.0)
MCH: 30.1 pg (ref 26.0–34.0)
MCHC: 31.8 g/dL (ref 30.0–36.0)
MCV: 94.8 fL (ref 78.0–100.0)
PLATELETS: 203 10*3/uL (ref 150–400)
RBC: 3.45 MIL/uL — ABNORMAL LOW (ref 4.22–5.81)
RDW: 14.3 % (ref 11.5–15.5)
WBC: 10 10*3/uL (ref 4.0–10.5)

## 2014-06-23 LAB — BASIC METABOLIC PANEL
ANION GAP: 12 (ref 5–15)
BUN: 49 mg/dL — ABNORMAL HIGH (ref 6–23)
CALCIUM: 8.7 mg/dL (ref 8.4–10.5)
CO2: 22 meq/L (ref 19–32)
CREATININE: 1.5 mg/dL — AB (ref 0.50–1.35)
Chloride: 106 mEq/L (ref 96–112)
GFR calc non Af Amer: 38 mL/min — ABNORMAL LOW (ref >90)
GFR, EST AFRICAN AMERICAN: 44 mL/min — AB (ref >90)
Glucose, Bld: 164 mg/dL — ABNORMAL HIGH (ref 70–99)
Potassium: 5.4 mEq/L — ABNORMAL HIGH (ref 3.7–5.3)
SODIUM: 140 meq/L (ref 137–147)

## 2014-06-23 LAB — PREPARE FRESH FROZEN PLASMA: UNIT DIVISION: 0

## 2014-06-23 LAB — PROTIME-INR
INR: 1.35 (ref 0.00–1.49)
Prothrombin Time: 16.8 seconds — ABNORMAL HIGH (ref 11.6–15.2)

## 2014-06-23 MED ORDER — ENOXAPARIN SODIUM 80 MG/0.8ML ~~LOC~~ SOLN
80.0000 mg | Freq: Two times a day (BID) | SUBCUTANEOUS | Status: DC
  Administered 2014-06-23 – 2014-06-24 (×2): 80 mg via SUBCUTANEOUS
  Filled 2014-06-23 (×3): qty 0.8

## 2014-06-23 MED ORDER — HYDROCODONE-ACETAMINOPHEN 5-325 MG PO TABS: 1.0000 | ORAL_TABLET | ORAL | Status: DC | PRN

## 2014-06-23 MED ORDER — WARFARIN SODIUM 7.5 MG PO TABS
7.5000 mg | ORAL_TABLET | Freq: Once | ORAL | Status: AC
  Administered 2014-06-23: 7.5 mg via ORAL
  Filled 2014-06-23: qty 1

## 2014-06-23 MED ORDER — ENOXAPARIN SODIUM 40 MG/0.4ML ~~LOC~~ SOLN
40.0000 mg | SUBCUTANEOUS | Status: DC
  Filled 2014-06-23: qty 0.4

## 2014-06-23 MED ORDER — SODIUM CHLORIDE 0.9 % IV SOLN
INTRAVENOUS | Status: DC
  Administered 2014-06-23 – 2014-06-24 (×2): via INTRAVENOUS

## 2014-06-23 NOTE — Progress Notes (Signed)
Pt stable ll equal Ankle df ok left Mobilize with PT xrays ok snf this week

## 2014-06-23 NOTE — Care Management Note (Addendum)
    Page 1 of 1   06/24/2014     3:33:47 PM CARE MANAGEMENT NOTE 06/24/2014  Patient:  MICKELL, BIRDWELL   Account Number:  [id number]  Date Initiated:  06/23/2014  Documentation initiated by:  Dessa Phi  Subjective/Objective Assessment:   80 y/o m admitted w/fall.XJ:OITG.     Action/Plan:   From ALF-[facility].   Anticipated DC Date:  06/24/2014   Anticipated DC Plan:  [place]  CM consult      Choice offered to / List presented to:             Status of service:  Completed, signed off Medicare Important Message given?  YES (If response is "NO", the following Medicare IM given date fields will be blank) Date Medicare IM given:  06/23/2014 Medicare IM given by:  [facility] Date Additional Medicare IM given:   Additional Medicare IM given by:    Discharge Disposition:  [place]  Per UR Regulation:  Reviewed for med. necessity/level of care/duration of stay  If discussed at [place] of Stay Meetings, dates discussed:    Comments:  06/24/14 Dessa Phi RN BSN NCM [phone] dc snf.  06/23/14 Dessa Phi RN BSN NCM [phone] pod#1 l hip hemiarthroplasty.PT-SNF.CSW following for SNF.

## 2014-06-23 NOTE — Op Note (Signed)
NAMESTACE, PEACE NO.:  [id number]  MEDICAL RECORD NO.:  [id number]  LOCATION:  6063                         FACILITY:  [facility]  PHYSICIAN:  Anderson Malta, M.D.    DATE OF BIRTH:  23-Apr-1919  DATE OF PROCEDURE: DATE OF DISCHARGE:                              OPERATIVE REPORT   PREOPERATIVE DIAGNOSIS:  Left displaced femoral neck fracture.  POSTOPERATIVE DIAGNOSIS:  Left displaced femoral neck fracture.  PROCEDURE:  Left hip hemiarthroplasty.  DePuy Tri Lock size 5 stem was press fit.  +5, 49 head.  SURGEON:  Anderson Malta, M.D.  ASSISTANT:  None.  ANESTHESIA:  General.  ESTIMATED BLOOD LOSS:  150.  INDICATIONS:  Ariz is a patient with left hip pain presents for hip hemiarthroplasty after displaced femoral neck fracture and explanation of risks and benefits.  PROCEDURE IN DETAIL:  The patient was brought to the operating room where general endotracheal anesthesia was induced.  Preoperative antibiotics were administered.  Time-out was called.  The patient was placed in lateral decubitus position left hip up.  He was prescrubbed with alcohol and Betadine, allowed to air dry, prepped with DuraPrep solution and draped in sterile manner including the foot.  Charlie Pitter was used to cover the operative field.  Time-out was called.  Posterior approach was made to the hip.  Skin and subcutaneous tissue were sharply divided.  Fascia lata was divided.  Charnley retractor was placed. Sciatic nerve palpated and protected.  Piriformis tendon tagged and removed.  External rotators were removed off the capsule.  The capsule was tagged and split.  The femoral head was removed and sized to 49 mm. Broach cut was made on the femoral neck slightly less than a fingerbreadth above the lesser trochanter.  Broaching was performed up to size 5 with good press fit obtained.  __________ was performed. Stable fit achieved at this time.  Trial reduction was performed with a +0 +5 head  and neck.  The +5 gave excellent stability in external rotation.  Position __________ 90 degrees of hip flexion, 10 degrees adduction, and about 60 degrees of internal rotation.  Trial components were removed.  True components were placed.  Thorough irrigation was performed both before and after the final reduction.  Leg lengths approximately equal.  Capsule was closed using #1 Vicryl suture.  The piriformis tendon tagged to the capsule.  Tensor fascia lata was closed using #1 Vicryl suture interrupted.  Skin closed using 0 Vicryl suture, 2-0 Vicryl suture, and skin staples.  The Aquacel dressing was placed.  Knee immobilizer was placed.  Leg lengths approximately equal at the conclusion of the case.  The patient tolerated the procedure well without immediate complication.  Transferred to recovery room in stable condition.     Anderson Malta, M.D.     GSD/MEDQ  D:  06/22/2014  T:  06/23/2014  Job:  [id number]

## 2014-06-23 NOTE — Anesthesia Postprocedure Evaluation (Signed)
  Anesthesia Post-op Note  Patient: Kurt Thomas  Procedure(s) Performed: Procedure(s) (LRB): LEFT HEMIARTHROPLASTY HIP (Left)  Patient Location: PACU  Anesthesia Type: General  Level of Consciousness: awake and alert   Airway and Oxygen Therapy: Patient Spontanous Breathing  Post-op Pain: mild  Post-op Assessment: Post-op Vital signs reviewed, Patient's Cardiovascular Status Stable, Respiratory Function Stable, Patent Airway and No signs of Nausea or vomiting  Last Vitals:  Filed Vitals:   06/23/14 0750  BP:   Pulse:   Temp:   Resp: 18    Post-op Vital Signs: stable   Complications: No apparent anesthesia complications

## 2014-06-23 NOTE — Progress Notes (Signed)
PROGRESS NOTE  Kurt Thomas IOX:[id number] DOB: 1919-06-12 DOA: 06/20/2014 PCP:  Melinda Crutch, MD   Brief history  80 year old male with a history of paroxysmal fibrillation, hypertension, dementia presents with a mechanical fall. The patient is a poor historian due to his history of dementia. His history is obtained from review of the chart and speaking with the patient's daughter at the bedside. Apparently the patient fell out of his chair onto his left side and was unable to get up. X-rays in the emergency department revealed a left subcapital femoral neck fracture. At baseline, the patient usually ambulates with cane; however, the patient had another mechanical fall on 06/12/2014 after which he began using a walker. At baseline, the patient is pleasant and confused but is able to carry on a conversation without difficulty. There was no history of syncope, vomiting, diarrhea, chest pain, shortness of breath. Assessment/Plan: Left Subcapital Femoral Neck Fracture -appreciate ortho -06/22/2014--left hip hemiarthroplasty--Dr. Marlou Sa -pain control -PT/OT after surgery-->SNF -additional vitamin K and FFP given prior to surgery atrial fibrillation -Presently rate controlled -Continue carvedilol -additional vitamin K and FFP given prior to surgery -Restart Coumadin with Lovenox bridge today -Will likely need to go to the skilled nursing facility with Lovenox bridge until INR is greater than 2.0 Renal insufficiency -Unclear what the patient's previous renal baseline, but suspect he has CKD -Continue to monitor -started IVF -BMP in am Hypertension -Continue carvedilol -Hold ACEi as his K is trending up Dementia -Continue Aricept Hyperkalemia -Partly from potassium supplementation and IV fluid -Remove potassium from IV fluid -BMP in the morning  Family Communication: Daughter updated on phone Disposition Plan: SNF     Procedures/Studies: Dg Chest 1 View  06/20/2014    CLINICAL DATA:  Recent fall with chest pain an known left femoral fracture  EXAM: CHEST - 1 VIEW  COMPARISON:  None.  FINDINGS: Cardiac shadow is enlarged. Postsurgical changes are seen. A few calcified granulomas are noted. No focal infiltrate or sizable effusion is seen. No bony abnormality is noted.  IMPRESSION: No acute abnormality noted.   Electronically Signed   By: Inez Catalina M.D.   On: 06/20/2014 19:06   Dg Lumbar Spine Complete  06/20/2014   CLINICAL DATA:  Fall today with low back pain  EXAM: LUMBAR SPINE - COMPLETE 4+ VIEW  COMPARISON:  None.  FINDINGS: Five lumbar type vertebral bodies are well visualized. Vertebral body height is well maintained. No spondylolysis or spondylolisthesis is seen. Mild disc space narrowing is noted at L5-S1. No gross soft tissue abnormality is seen.  IMPRESSION: No acute abnormality noted.   Electronically Signed   By: Inez Catalina M.D.   On: 06/20/2014 19:05   Dg Hip Complete Left  06/20/2014   CLINICAL DATA:  Fall today with left hip pain, initial encounter  EXAM: LEFT HIP - COMPLETE 2+ VIEW  COMPARISON:  None.  FINDINGS: A subcapital left femoral neck fracture is noted with impaction and angulation at the fracture site. The pelvic ring is intact. No other focal abnormality is seen.  IMPRESSION: Left subcapital femoral neck fracture.   Electronically Signed   By: Inez Catalina M.D.   On: 06/20/2014 19:04   Dg Femur Left  06/12/2014   CLINICAL DATA:  [place] and complains of left knee and hip pain.  EXAM: LEFT FEMUR - 2 VIEW  COMPARISON:  06/12/2014 left knee  FINDINGS: Degenerative changes at the patellofemoral compartment of the knee. Multiple surgical clips  in the left hemipelvis region. Degenerative changes along the superior lateral left hip joint. Negative for a fracture or dislocation. Joint space narrowing and osteophytes in the medial left knee compartment.  IMPRESSION: Negative for an acute fracture or dislocation in the left femur.  Degenerative changes  at the left hip and left knee.   Electronically Signed   By: Markus Daft M.D.   On: 06/12/2014 10:20   Pelvis Portable  06/22/2014   CLINICAL DATA:  Postop  EXAM: PORTABLE PELVIS 1-2 VIEWS  COMPARISON:  06/20/2014  FINDINGS: Left hip hemiarthroplasty has been placed. Anatomic alignment. No breakage or loosening of the hardware. Osteopenia. Postoperative changes in the pelvis.  IMPRESSION: Left hip hemiarthroplasty anatomically aligned.   Electronically Signed   By: Maryclare Bean M.D.   On: 06/22/2014 21:32   Dg Knee Complete 4 Views Left  06/12/2014   CLINICAL DATA:  Patient fell ; pain after fall  EXAM: LEFT KNEE - COMPLETE 4+ VIEW  COMPARISON:  None.  FINDINGS: Frontal, lateral, and bilateral oblique views were obtained. There is a small avulsion arising from the medial aspect of the medial proximal tibia just inferior to the plateau. The plateau itself does not appear disrupted. No other evidence of fracture. No dislocation. No appreciable joint effusion. There is moderate narrowing medially and in the patellofemoral joint. There is mild spurring in all compartments. Bones are somewhat osteoporotic. There are foci of arterial vascular calcification.  IMPRESSION: Small avulsion along the medial aspect of the proximal tibial epiphysis without involvement of the plateau surface. No other evidence of fracture. No dislocation. Moderate osteoarthritic change. No appreciable joint effusion.   Electronically Signed   By: Lowella Grip M.D.   On: 06/12/2014 10:21         Subjective: Patient denies fevers, chills, headache, chest pain, dyspnea, nausea, vomiting, diarrhea, abdominal pain, dysuria, hematuria   Objective: Filed Vitals:   06/23/14 1200 06/23/14 1326 06/23/14 1600 06/23/14 1611  BP:  136/68  154/85  Pulse:  86  79  Temp:  97.9 F (36.6 C)    TempSrc:  Oral    Resp: 16 16 18    Weight:      SpO2: 99% 98% 98%     Intake/Output Summary (Last 24 hours) at 06/23/14 1809 Last data filed  at 06/23/14 6384  Gross per 24 hour  Intake   2571 ml  Output    400 ml  Net   2171 ml   Weight change:  Exam:   General:  Pt is alert, follows commands appropriately, not in acute distress  HEENT: No icterus, No thrush,  [place]/AT  Cardiovascular: IRRR, S1/S2, no rubs, no gallops  Respiratory: CTA bilaterally, no wheezing, no crackles, no rhonchi  Abdomen: Soft/+BS, non tender, non distended, no guarding  Extremities: No edema, No lymphangitis, No petechiae, No rashes, no synovitis  Data Reviewed: Basic Metabolic Panel:  Recent Labs Lab 06/20/14 1953 06/21/14 0505 06/22/14 0523 06/23/14 0440  NA 138 138 139 140  K 5.3 4.9 5.1 5.4*  CL 102 105 106 106  CO2 22 22 24 22   GLUCOSE 126* 145* 131* 164*  BUN 52* 54* 56* 49*  CREATININE 1.63* 1.68* 1.76* 1.50*  CALCIUM 9.5 8.9 8.9 8.7   Liver Function Tests:  Recent Labs Lab 06/20/14 1953 06/21/14 0505  AST 18 15  ALT 17 14  ALKPHOS 88 74  BILITOT 0.6 0.6  PROT 6.9 6.0  ALBUMIN 3.3* 2.8*   No results for input(s): LIPASE,  AMYLASE in the last 168 hours. No results for input(s): AMMONIA in the last 168 hours. CBC:  Recent Labs Lab 06/20/14 1953 06/21/14 0505 06/22/14 0523 06/23/14 0440  WBC 11.6* 8.5 8.4 10.0  NEUTROABS 9.0* 6.0  --   --   HGB 13.2 11.6* 11.5* 10.4*  HCT 40.9 36.7* 36.6* 32.7*  MCV 93.0 93.6 94.1 94.8  PLT 203 180 187 203   Cardiac Enzymes:  Recent Labs Lab 06/20/14 2239  CKTOTAL 153  CKMB 2.5  TROPONINI <0.30   BNP: Invalid input(s): POCBNP CBG:  Recent Labs Lab 06/22/14 2151  GLUCAP 106*    Recent Results (from the past 240 hour(s))  Urine culture     Status: None   Collection Time: 06/21/14  2:50 AM  Result Value Ref Range Status   Specimen Description URINE, CLEAN CATCH  Final   Special Requests NONE  Final   Culture  Setup Time   Final    06/21/2014 11:19 Performed at [facility] [facility]   Final    50,000 COLONIES/ML Performed at [facility]   Final    Multiple bacterial morphotypes present, none predominant. Suggest appropriate recollection if clinically indicated. Performed at [facility]    Report Status 06/22/2014 FINAL  Final  MRSA PCR Screening     Status: None   Collection Time: 06/21/14  3:21 AM  Result Value Ref Range Status   MRSA by PCR NEGATIVE NEGATIVE Final    Comment:        The GeneXpert MRSA Assay (FDA approved for NASAL specimens only), is one component of a comprehensive MRSA colonization surveillance program. It is not intended to diagnose MRSA infection nor to guide or monitor treatment for MRSA infections.   Surgical PCR screen     Status: Abnormal   Collection Time: 06/22/14  4:19 PM  Result Value Ref Range Status   MRSA, PCR NEGATIVE NEGATIVE Final   Staphylococcus aureus POSITIVE (A) NEGATIVE Final    Comment:        The Xpert SA Assay (FDA approved for NASAL specimens in patients over 69 years of age), is one component of a comprehensive surveillance program.  Test performance has been validated by [employer] for patients greater than or equal to 25 year old. It is not intended to diagnose infection nor to guide or monitor treatment.      Scheduled Meds: . carvedilol  12.5 mg Oral BID WC  . docusate sodium  100 mg Oral QHS  . donepezil  10 mg Oral QHS  . sodium chloride  3 mL Intravenous Q12H  . Warfarin - Pharmacist Dosing Inpatient   Does not apply q1800   Continuous Infusions: . sodium chloride Stopped (06/22/14 2217)  . lactated ringers       Reily Treloar, DO  [facility] Pager [phone]  If 7PM-7AM, please contact night-coverage [email or web address] Password [email or web address] 06/23/2014, 6:09 PM   LOS: 3 days

## 2014-06-23 NOTE — Progress Notes (Signed)
ANTICOAGULATION CONSULT NOTE - Initial Consult  Pharmacy Consult for Coumadin Indication: VTE prophylaxis, A.fib  No Known Allergies  Patient Measurements: Weight: 185 lb 3 oz (84 kg)  Vital Signs: Temp: 97.4 F (36.3 C) (12/15 0536) Temp Source: Oral (12/15 0536) BP: 169/88 mmHg (12/15 0748) Pulse Rate: 92 (12/15 0748)  Labs:  Recent Labs  06/20/14 2239 06/21/14 0505 06/22/14 0523 06/22/14 1600 06/23/14 0440 06/23/14 0915  HGB  --  11.6* 11.5*  --  10.4*  --   HCT  --  36.7* 36.6*  --  32.7*  --   PLT  --  180 187  --  203  --   LABPROT  --  28.7* 22.9* 18.6*  --  16.8*  INR  --  2.68* 2.00* 1.53*  --  1.35  CREATININE  --  1.68* 1.76*  --  1.50*  --   CKTOTAL 153  --   --   --   --   --   CKMB 2.5  --   --   --   --   --   TROPONINI <0.30  --   --   --   --   --     Estimated Creatinine Clearance: 32.8 mL/min (by C-G formula based on Cr of 1.5).   Medical History: Past Medical History  Diagnosis Date  . High blood pressure   . Memory loss   . Low back pain   . Stroke   . Atrial fibrillation   . Prostate cancer   . CAD (coronary artery disease)     Medications:  Prescriptions prior to admission  Medication Sig Dispense Refill Last Dose  . carvedilol (COREG) 12.5 MG tablet Take 12.5 mg by mouth 2 (two) times daily with a meal.   06/20/2014 at 0700  . docusate sodium (COLACE) 100 MG capsule Take 100 mg by mouth at bedtime.   06/19/2014 at Unknown time  . donepezil (ARICEPT) 10 MG tablet Take 10 mg by mouth at bedtime.   06/19/2014 at Unknown time  . DULoxetine (CYMBALTA) 20 MG capsule Take 1 capsule (20 mg total) by mouth daily. 30 capsule 11 06/20/2014 at Unknown time  . enalapril (VASOTEC) 20 MG tablet Take 20 mg by mouth 2 (two) times daily.    06/20/2014 at Unknown time  . ranitidine (ZANTAC) 150 MG capsule Take 150 mg by mouth daily with breakfast.    06/20/2014 at Unknown time  . traMADol (ULTRAM) 50 MG tablet Take 50 mg by mouth 4 (four) times daily  as needed for moderate pain or severe pain (pain).   06/19/2014 at Unknown time  . warfarin (COUMADIN) 5 MG tablet Take 5 mg by mouth daily.   06/19/2014 at 1900  . albuterol (PROVENTIL) (2.5 MG/3ML) 0.083% nebulizer solution Take 2.5 mg by nebulization every 6 (six) hours as needed for wheezing or shortness of breath.   unknown at unknown time  . Camphor-Eucalyptus-Menthol (VICKS VAPORUB) 4.7-1.2-2.6 % OINT Apply 1 application topically at bedtime as needed (for congestion).   unknown at unknown time  . Menthol, Topical Analgesic, (ICY HOT EX) Apply 1 application topically every 8 (eight) hours as needed (for pain).   unknown at unknown time  . promethazine-codeine (PHENERGAN WITH CODEINE) 6.25-10 MG/5ML syrup Take 5 mLs by mouth 4 (four) times daily as needed for cough.   unknown at unknown time  . sodium chloride (OCEAN) 0.65 % SOLN nasal spray Place 2 sprays into both nostrils 4 (four) times daily as  needed (for dry nose).    unknown at unknown time    Assessment: 80yo M admitted 12/12 with hip fracture after falling. Vit. K was given on 12/12, 2/13, and 12/14(total of 17.5mg ) to bring the INR down. On 12/14 the INR was 1.53 and hemiarthroplasty was performed. Pharmacy is asked to resume Coumadin.   Home dose: 5mg  daily  Today: INR 1.35, patient refused last nights dose Regular diet started today Hgb 11.5 No drug interactions noted  Goal of Therapy:  INR 2-3 Monitor platelets by anticoagulation protocol: Yes   Plan:   Give Coumadin 7.5mg  tonight.  Check PT/INR daily.  Because of recent Vit. K, there is likely to be resistance to Coumadin for up to a week. If the INR isn't close to therapeutic in 1-2 days, consider Lovenox bridging.  Dolly Rias RPh 06/23/2014, 11:00 AM Pager [phone]

## 2014-06-23 NOTE — Evaluation (Signed)
Physical Therapy Evaluation Patient Details Name: Kurt Thomas MRN: [id number] DOB: 12/02/1918 Today's Date: 06/23/2014   History of Present Illness  80 year old patient resident of assisted living facility had a mechanical fall 2 days ago. Pt s/p left hip hemiarthroplasty due to left femoral neck fracture displaced.  Clinical Impression  Pt admitted with above diagnosis. Pt currently with functional limitations due to the deficits listed below (see PT Problem List).  Pt will benefit from skilled PT to increase their independence and safety with mobility to allow discharge to the venue listed below.  Pt requiring total assist for bed mobility and unable to tolerate sitting EOB due to L hip pain despite premedication for session.     Follow Up Recommendations SNF    Equipment Recommendations  Rolling walker with 5" wheels    Recommendations for Other Services       Precautions / Restrictions Precautions Precautions: Fall;Posterior Hip Restrictions Other Position/Activity Restrictions: WBAT      Mobility  Bed Mobility Overal bed mobility: Needs Assistance Bed Mobility: Supine to Sit;Sit to Supine     Supine to sit: Total assist;HOB elevated Sit to supine: Total assist   General bed mobility comments: pt required assist for upper and lower body as well as scooting to EOB utilizing bed pad, attempted to assist trunk upright and once upright, pt reported too much hip pain and returned upper body back to bed, attempted to sit upright x2 however pt not tolerating  Transfers                    Ambulation/Gait                Stairs            Wheelchair Mobility    Modified Rankin (Stroke Patients Only)       Balance                                             Pertinent Vitals/Pain Pain Assessment: Faces Faces Pain Scale: Hurts whole lot Pain Location: L hip Pain Descriptors / Indicators: Grimacing;Guarding Pain Intervention(s):  Limited activity within patient's tolerance;Monitored during session;Premedicated before session;Repositioned    Home Living Family/patient expects to be discharged to:: Skilled nursing facility                      Prior Function Level of Independence: Independent with assistive device(s)         Comments: ambulatory with SPC     Hand Dominance        Extremity/Trunk Assessment               Lower Extremity Assessment: Generalized weakness;LLE deficits/detail   LLE Deficits / Details: unable to tolerate any active movement     Communication   Communication: HOH  Cognition Arousal/Alertness: Awake/alert Behavior During Therapy: WFL for tasks assessed/performed Overall Cognitive Status: History of cognitive impairments - at baseline       Memory: Decreased short-term memory;Decreased recall of precautions              General Comments      Exercises        Assessment/Plan    PT Assessment Patient needs continued PT services  PT Diagnosis Difficulty walking;Acute pain   PT Problem List Decreased strength;Decreased knowledge of use of DME;Decreased range of motion;Decreased knowledge  of precautions;Decreased mobility;Pain  PT Treatment Interventions DME instruction;Gait training;Functional mobility training;Patient/family education;Therapeutic activities;Therapeutic exercise   PT Goals (Current goals can be found in the Care Plan section) Acute Rehab PT Goals PT Goal Formulation: With patient Time For Goal Achievement: 06/30/14 Potential to Achieve Goals: Good    Frequency Min 3X/week   Barriers to discharge        Co-evaluation               End of Session   Activity Tolerance: Patient limited by pain Patient left: in bed;with call bell/phone within reach;with bed alarm set Nurse Communication: Mobility status;Patient requests pain meds;Precautions         Time: 6808-8110 PT Time Calculation (min) (ACUTE ONLY): 18  min   Charges:   PT Evaluation $Initial PT Evaluation Tier I: 1 Procedure PT Treatments $Therapeutic Activity: 8-22 mins   PT G Codes:          Mable Dara,KATHrine E 06/23/2014, 12:40 PM Carmelia Bake, PT, DPT 06/23/2014 Pager: [phone]

## 2014-06-23 NOTE — Clinical Social Work Placement (Signed)
Clinical Social Work Department CLINICAL SOCIAL WORK PLACEMENT NOTE 06/23/2014  Patient:  Kurt Thomas, Kurt Thomas  Account Number:  [id number] Admit date:  06/20/2014  Clinical Social Worker:  Daiva Huge  Date/time:  06/23/2014 01:02 PM  Clinical Social Work is seeking post-discharge placement for this patient at the following level of care:   [facility]   (*CSW will update this form in Epic as items are completed)   06/23/2014  Patient/family provided with [facility] Department of Clinical Social Work's list of facilities offering this level of care within the geographic area requested by the patient (or if unable, by the patient's family).  06/23/2014  Patient/family informed of their freedom to choose among providers that offer the needed level of care, that participate in Medicare, Medicaid or managed care program needed by the patient, have an available bed and are willing to accept the patient.  06/23/2014  Patient/family informed of [facility]' ownership interest in [facility], as well as of the fact that they are under no obligation to receive care at this facility.  PASARR submitted to EDS on 06/23/2014 PASARR number received on 06/23/2014  FL2 transmitted to all facilities in geographic area requested by pt/family on  06/23/2014 FL2 transmitted to all facilities within larger geographic area on   Patient informed that his/her managed care company has contracts with or will negotiate with  certain facilities, including the following:     Patient/family informed of bed offers received:   Patient chooses bed at  Physician recommends and patient chooses bed at    Patient to be transferred to  on   Patient to be transferred to facility by  Patient and family notified of transfer on  Name of family member notified:    The following physician request were entered in Epic:   Additional Comments: Eduard Clos, MSW, [place]

## 2014-06-23 NOTE — Clinical Social Work Psychosocial (Signed)
Clinical Social Work Department BRIEF PSYCHOSOCIAL ASSESSMENT 06/23/2014  Patient:  Kurt Thomas, Kurt Thomas     Account Number:  [id number]     Admit date:  06/20/2014  Clinical Social Worker:  Daiva Huge  Date/Time:  06/23/2014 12:50 PM  Referred by:  Physician  Date Referred:  06/22/2014 Referred for  SNF Placement   Other Referral:   Interview type:  Patient Other interview type:    PSYCHOSOCIAL DATA Living Status:  FACILITY Admitted from [facility] Level of care:  Assisted Living Primary support name:  daughter- Artis Flock Primary support relationship to patient:  FAMILY Degree of support available:   good    CURRENT CONCERNS Current Concerns  Post-Acute Placement   Other Concerns:    SOCIAL WORK ASSESSMENT / PLAN Patient admitted from [facility]- he will now need SNF placement for rehab. Discussed with him and have left message for family as well.   Assessment/plan status:  Other - See comment Other assessment/ plan:   FL2 for SNF   Information/referral to community resources:    PATIENT'S/FAMILY'S RESPONSE TO PLAN OF CARE: Patient is agreeable to SNF for short term rehab- will proceed with SNF search and await word from family for further conversation about SNF planning.       Eduard Clos, MSW, [place]

## 2014-06-23 NOTE — Progress Notes (Addendum)
ANTICOAGULATION CONSULT NOTE - Initial Consult  Pharmacy Consult for Lovenox Indication: bridging until therapeutic INR - AFib  No Known Allergies  Patient Measurements: Weight: 185 lb 3 oz (84 kg)  Vital Signs: Temp: 97.9 F (36.6 C) (12/15 1326) Temp Source: Oral (12/15 1326) BP: 154/85 mmHg (12/15 1611) Pulse Rate: 79 (12/15 1611)  Labs:  Recent Labs  06/20/14 2239 06/21/14 0505 06/22/14 0523 06/22/14 1600 06/23/14 0440 06/23/14 0915  HGB  --  11.6* 11.5*  --  10.4*  --   HCT  --  36.7* 36.6*  --  32.7*  --   PLT  --  180 187  --  203  --   LABPROT  --  28.7* 22.9* 18.6*  --  16.8*  INR  --  2.68* 2.00* 1.53*  --  1.35  CREATININE  --  1.68* 1.76*  --  1.50*  --   CKTOTAL 153  --   --   --   --   --   CKMB 2.5  --   --   --   --   --   TROPONINI <0.30  --   --   --   --   --     Estimated Creatinine Clearance: 32.8 mL/min (by C-G formula based on Cr of 1.5).   Medical History: Past Medical History  Diagnosis Date  . High blood pressure   . Memory loss   . Low back pain   . Stroke   . Atrial fibrillation   . Prostate cancer   . CAD (coronary artery disease)     Medications:  Prescriptions prior to admission  Medication Sig Dispense Refill Last Dose  . carvedilol (COREG) 12.5 MG tablet Take 12.5 mg by mouth 2 (two) times daily with a meal.   06/20/2014 at 0700  . docusate sodium (COLACE) 100 MG capsule Take 100 mg by mouth at bedtime.   06/19/2014 at Unknown time  . donepezil (ARICEPT) 10 MG tablet Take 10 mg by mouth at bedtime.   06/19/2014 at Unknown time  . DULoxetine (CYMBALTA) 20 MG capsule Take 1 capsule (20 mg total) by mouth daily. 30 capsule 11 06/20/2014 at Unknown time  . enalapril (VASOTEC) 20 MG tablet Take 20 mg by mouth 2 (two) times daily.    06/20/2014 at Unknown time  . ranitidine (ZANTAC) 150 MG capsule Take 150 mg by mouth daily with breakfast.    06/20/2014 at Unknown time  . traMADol (ULTRAM) 50 MG tablet Take 50 mg by mouth 4  (four) times daily as needed for moderate pain or severe pain (pain).   06/19/2014 at Unknown time  . warfarin (COUMADIN) 5 MG tablet Take 5 mg by mouth daily.   06/19/2014 at 1900  . albuterol (PROVENTIL) (2.5 MG/3ML) 0.083% nebulizer solution Take 2.5 mg by nebulization every 6 (six) hours as needed for wheezing or shortness of breath.   unknown at unknown time  . Camphor-Eucalyptus-Menthol (VICKS VAPORUB) 4.7-1.2-2.6 % OINT Apply 1 application topically at bedtime as needed (for congestion).   unknown at unknown time  . Menthol, Topical Analgesic, (ICY HOT EX) Apply 1 application topically every 8 (eight) hours as needed (for pain).   unknown at unknown time  . promethazine-codeine (PHENERGAN WITH CODEINE) 6.25-10 MG/5ML syrup Take 5 mLs by mouth 4 (four) times daily as needed for cough.   unknown at unknown time  . sodium chloride (OCEAN) 0.65 % SOLN nasal spray Place 2 sprays into both nostrils 4 (four)  times daily as needed (for dry nose).    unknown at unknown time    Assessment: 80yo M admitted 12/12 with hip fracture after falling. Vit. K was given on 12/12, 2/13, and 12/14(total of 17.5mg ) to bring the INR down. On 12/14 the INR was 1.53 and hemiarthroplasty was performed. Pharmacy is dosing Coumadin.   Home dose: 5mg  daily  Today: INR 1.35, patient refused last nights dose Regular diet started today Hgb 11.5 No drug interactions noted  PM Update Pharmacy consulted to dose Lovenox for bridging until INR therapeutic   Goal of Therapy:  INR 2-3 Monitor platelets by anticoagulation protocol: Yes   Plan:   Lovenox 80mg  sq q12h  Watch CrCl; will need to adjust if CrCl < 30 ml/min  Continue Coumadin as ordered (dose charted as given tonight)  F/U AM INR - plan to d/c Lovenox once INR > 2  Leone Haven, PharmD  06/23/2014, 5:56 PM

## 2014-06-23 NOTE — Progress Notes (Signed)
OT Cancellation Note  Patient Details Name: Duante Arocho MRN: [id number] DOB: July 23, 1918   Cancelled Treatment:    Reason Eval/Treat Not Completed: Other (comment).  Pt limited by pain and only made it to EOB with PT earlier, even with premedication.  Will check back tomorrow.  Kendry Pfarr 06/23/2014, 12:29 PM  Lesle Chris, OTR/L [phone] 06/23/2014

## 2014-06-24 DIAGNOSIS — R41841 Cognitive communication deficit: Secondary | ICD-10-CM | POA: Diagnosis not present

## 2014-06-24 DIAGNOSIS — K219 Gastro-esophageal reflux disease without esophagitis: Secondary | ICD-10-CM | POA: Diagnosis not present

## 2014-06-24 DIAGNOSIS — F0390 Unspecified dementia without behavioral disturbance: Secondary | ICD-10-CM | POA: Diagnosis not present

## 2014-06-24 DIAGNOSIS — N289 Disorder of kidney and ureter, unspecified: Secondary | ICD-10-CM | POA: Diagnosis not present

## 2014-06-24 DIAGNOSIS — R1312 Dysphagia, oropharyngeal phase: Secondary | ICD-10-CM | POA: Diagnosis not present

## 2014-06-24 DIAGNOSIS — Z96642 Presence of left artificial hip joint: Secondary | ICD-10-CM | POA: Diagnosis not present

## 2014-06-24 DIAGNOSIS — E875 Hyperkalemia: Secondary | ICD-10-CM | POA: Diagnosis not present

## 2014-06-24 DIAGNOSIS — R2681 Unsteadiness on feet: Secondary | ICD-10-CM | POA: Diagnosis not present

## 2014-06-24 DIAGNOSIS — I482 Chronic atrial fibrillation: Secondary | ICD-10-CM | POA: Diagnosis not present

## 2014-06-24 DIAGNOSIS — Z9181 History of falling: Secondary | ICD-10-CM | POA: Diagnosis not present

## 2014-06-24 DIAGNOSIS — G309 Alzheimer's disease, unspecified: Secondary | ICD-10-CM | POA: Diagnosis not present

## 2014-06-24 DIAGNOSIS — M25552 Pain in left hip: Secondary | ICD-10-CM | POA: Diagnosis not present

## 2014-06-24 DIAGNOSIS — S72002S Fracture of unspecified part of neck of left femur, sequela: Secondary | ICD-10-CM | POA: Diagnosis not present

## 2014-06-24 DIAGNOSIS — Z471 Aftercare following joint replacement surgery: Secondary | ICD-10-CM | POA: Diagnosis not present

## 2014-06-24 DIAGNOSIS — R278 Other lack of coordination: Secondary | ICD-10-CM | POA: Diagnosis not present

## 2014-06-24 DIAGNOSIS — I10 Essential (primary) hypertension: Secondary | ICD-10-CM | POA: Diagnosis not present

## 2014-06-24 DIAGNOSIS — N183 Chronic kidney disease, stage 3 (moderate): Secondary | ICD-10-CM | POA: Diagnosis not present

## 2014-06-24 DIAGNOSIS — S72002D Fracture of unspecified part of neck of left femur, subsequent encounter for closed fracture with routine healing: Secondary | ICD-10-CM | POA: Diagnosis not present

## 2014-06-24 DIAGNOSIS — M6281 Muscle weakness (generalized): Secondary | ICD-10-CM | POA: Diagnosis not present

## 2014-06-24 DIAGNOSIS — K5900 Constipation, unspecified: Secondary | ICD-10-CM | POA: Diagnosis not present

## 2014-06-24 DIAGNOSIS — D62 Acute posthemorrhagic anemia: Secondary | ICD-10-CM | POA: Diagnosis not present

## 2014-06-24 DIAGNOSIS — M21252 Flexion deformity, left hip: Secondary | ICD-10-CM | POA: Diagnosis not present

## 2014-06-24 DIAGNOSIS — F329 Major depressive disorder, single episode, unspecified: Secondary | ICD-10-CM | POA: Diagnosis not present

## 2014-06-24 DIAGNOSIS — Z7901 Long term (current) use of anticoagulants: Secondary | ICD-10-CM | POA: Diagnosis not present

## 2014-06-24 DIAGNOSIS — D500 Iron deficiency anemia secondary to blood loss (chronic): Secondary | ICD-10-CM | POA: Diagnosis not present

## 2014-06-24 LAB — BASIC METABOLIC PANEL
ANION GAP: 9 (ref 5–15)
BUN: 43 mg/dL — ABNORMAL HIGH (ref 6–23)
CHLORIDE: 108 meq/L (ref 96–112)
CO2: 23 mEq/L (ref 19–32)
CREATININE: 1.65 mg/dL — AB (ref 0.50–1.35)
Calcium: 8.5 mg/dL (ref 8.4–10.5)
GFR, EST AFRICAN AMERICAN: 40 mL/min — AB (ref >90)
GFR, EST NON AFRICAN AMERICAN: 34 mL/min — AB (ref >90)
Glucose, Bld: 124 mg/dL — ABNORMAL HIGH (ref 70–99)
POTASSIUM: 5 meq/L (ref 3.7–5.3)
Sodium: 140 mEq/L (ref 137–147)

## 2014-06-24 LAB — CBC
HEMATOCRIT: 30.9 % — AB (ref 39.0–52.0)
Hemoglobin: 9.8 g/dL — ABNORMAL LOW (ref 13.0–17.0)
MCH: 30.1 pg (ref 26.0–34.0)
MCHC: 31.7 g/dL (ref 30.0–36.0)
MCV: 94.8 fL (ref 78.0–100.0)
PLATELETS: 176 10*3/uL (ref 150–400)
RBC: 3.26 MIL/uL — ABNORMAL LOW (ref 4.22–5.81)
RDW: 14.4 % (ref 11.5–15.5)
WBC: 9.5 10*3/uL (ref 4.0–10.5)

## 2014-06-24 LAB — PROTIME-INR
INR: 1.31 (ref 0.00–1.49)
Prothrombin Time: 16.5 seconds — ABNORMAL HIGH (ref 11.6–15.2)

## 2014-06-24 MED ORDER — TRAMADOL HCL 50 MG PO TABS: 50.0000 mg | ORAL_TABLET | Freq: Four times a day (QID) | ORAL | Status: DC | PRN

## 2014-06-24 MED ORDER — ISOSORB DINITRATE-HYDRALAZINE 20-37.5 MG PO TABS: 1.0000 | ORAL_TABLET | Freq: Two times a day (BID) | ORAL | Status: AC

## 2014-06-24 MED ORDER — WARFARIN SODIUM 7.5 MG PO TABS
7.5000 mg | ORAL_TABLET | Freq: Once | ORAL | Status: DC
  Filled 2014-06-24: qty 1

## 2014-06-24 MED ORDER — FERROUS FUMARATE 150 MG PO TABS: 1.0000 | ORAL_TABLET | Freq: Two times a day (BID) | ORAL | Status: DC

## 2014-06-24 MED ORDER — HYDROCODONE-ACETAMINOPHEN 5-325 MG PO TABS: 1.0000 | ORAL_TABLET | Freq: Four times a day (QID) | ORAL | Status: DC | PRN

## 2014-06-24 MED ORDER — WARFARIN SODIUM 5 MG PO TABS: 7.5000 mg | ORAL_TABLET | Freq: Every day | ORAL | Status: DC

## 2014-06-24 NOTE — Progress Notes (Signed)
Physician Discharge Summary  Real Cona ZJQ:[id number] DOB: 1919/01/17 DOA: 06/20/2014  PCP:  Melinda Crutch, MD  Admit date: 06/20/2014 Discharge date: 06/24/2014  Time spent: >30 minutes  Recommendations for Outpatient Follow-up:  Follow up with orthopedic service in 2 weeks Repeat CBC in 1 week to follow Hgb trend Repeat BMET in week to follow electrolytes and renal function Reassess BP and adjust medications as needed Please close follow up to coumadin and adjust dosage base on INR  Discharge Diagnoses:  Left hip fracture (s/p hemi-arthroplasty) Essential HTN Renal insufficiency/CKD stage 3 Chronic Atrial fibrillation Dementia  hyperkalemia AOCD with ABLA component   Discharge Condition: stable and improved. Discharge to SNF for physical rehab.   Diet recommendation: heart healthy diet  Filed Weights   06/22/14 0535 06/24/14 0500  Weight: 84 kg (185 lb 3 oz) 89.9 kg (198 lb 3.1 oz)    History of present illness:  80 year old male with a history of paroxysmal fibrillation, hypertension, dementia presents with a mechanical fall. The patient is a poor historian due to his history of dementia. His history is obtained from review of the chart and speaking with the patient's daughter at the bedside. Apparently the patient fell out of his chair onto his left side and was unable to get up. X-rays in the emergency department revealed a left subcapital femoral neck fracture. At baseline, the patient usually ambulates with cane; however, the patient had another mechanical fall on 06/12/2014 after which he began using a walker. At baseline, the patient is pleasant and confused but is able to carry on a conversation without difficulty. There was no history of syncope.  Hospital Course:  Left Subcapital Femoral Neck Fracture -appreciate ortho recommendations and assistance -06/22/2014--left hip hemiarthroplasty--By Dr. Marlou Sa -pain control to be achieve with use of tramadol and oxycodone as  needed -PT/OT has recommended SNF for rehabilitation -Follow up with Dr. Marlou Sa in 2 weeks after discharge -leave dressing in place until follow up -Weight bearing as tolerated  atrial fibrillation -Presently rate controlled -Continue carvedilol -Restart Coumadin and aim for INR goal of (2-3)  Renal insufficiency/CKD (stage 3 by GFR) -Unclear what the patient's previous renal baseline was -but base on GFR most likely stage 3 CKD -Creatinine has remained stable at 1.6 -will discontinue vasotec and will monitor BMET in 1 week after discharge -advise to follow low sodium diet  Hypertension -Continue carvedilol and discontinue vasotec (given renal failure and hyperkalemia) -will use bidil instead of vasotec -patient to follow low sodium diet  Dementia -Continue Aricept; continue supportive care -stable and at baseline  Hyperkalemia -Partly from potassium supplementation and IV fluid -potassium and rest of electrolytes WNL at discharge  Anemia: -appears to be AOCD with acute blood loss component -will monitor CBC and will start patient on ferrous sulfate  Procedures:  Left hemi-arthroplasty 06/22/14  Consultations:  Orthopedic service  Discharge Exam: Filed Vitals:   06/24/14 1104  BP:   Pulse:   Temp:   Resp: 16    General: Pt is alert X2, follows commands appropriately, not in acute distress; no fever  HEENT: No icterus, No thrush, [place]/AT  Cardiovascular: IRRR, no rubs, no gallops, no JVD  Respiratory: CTA bilaterally, no wheezing, no crackles, no rhonchi  Abdomen: Soft/+BS, non tender, non distended, no guarding  Extremities: trace edema, No lymphangitis, No petechiae, No rashes, no synovitis. Left leg dressings clean and intact    Discharge Instructions You were cared for by a hospitalist during your hospital stay. If you have any  questions about your discharge medications or the care you received while you were in the hospital after you are discharged,  you can call the unit and asked to speak with the hospitalist on call if the hospitalist that took care of you is not available. Once you are discharged, your primary care physician will handle any further medical issues. Please note that NO REFILLS for any discharge medications will be authorized once you are discharged, as it is imperative that you return to your primary care physician (or establish a relationship with a primary care physician if you do not have one) for your aftercare needs so that they can reassess your need for medications and monitor your lab values.  Discharge Instructions    Diet - low sodium heart healthy    Complete by:  As directed      Discharge instructions    Complete by:  As directed   Take medications as prescribed Physical rehabilitation as per SNF protocol Please check INR in 2 days and adjust further coumadin dosages (goal is INR 2-3). Patient with chronic A. Fib (currently rate controlled). Use to take coumadin 5 mg daily. Check CBC in 1 week to follow Hgb trend Check BMET in 1 week to follow electrolytes and renal function Follow up with Orthopedic service in 2 weeks after discharge     Partial weight bearing    Complete by:  As directed      Partial weight bearing    Complete by:  As directed           Current Discharge Medication List    START taking these medications   Details  Ferrous Fumarate 150 MG TABS Take 1 tablet (150 mg total) by mouth 2 (two) times daily.    HYDROcodone-acetaminophen (NORCO/VICODIN) 5-325 MG per tablet Take 1-2 tablets by mouth every 6 (six) hours as needed for severe pain. Qty: 30 tablet, Refills: 0    isosorbide-hydrALAZINE (BIDIL) 20-37.5 MG per tablet Take 1 tablet by mouth 2 (two) times daily.      CONTINUE these medications which have CHANGED   Details  traMADol (ULTRAM) 50 MG tablet Take 1 tablet (50 mg total) by mouth every 6 (six) hours as needed for moderate pain (pain). Qty: 30 tablet, Refills: 0     warfarin (COUMADIN) 5 MG tablet Take 1.5 tablets (7.5 mg total) by mouth daily. To be taken for the next 2 days; then will require INR level and further coumadin adjustments.      CONTINUE these medications which have NOT CHANGED   Details  carvedilol (COREG) 12.5 MG tablet Take 12.5 mg by mouth 2 (two) times daily with a meal.    docusate sodium (COLACE) 100 MG capsule Take 100 mg by mouth at bedtime.    donepezil (ARICEPT) 10 MG tablet Take 10 mg by mouth at bedtime.    DULoxetine (CYMBALTA) 20 MG capsule Take 1 capsule (20 mg total) by mouth daily. Qty: 30 capsule, Refills: 11    ranitidine (ZANTAC) 150 MG capsule Take 150 mg by mouth daily with breakfast.     albuterol (PROVENTIL) (2.5 MG/3ML) 0.083% nebulizer solution Take 2.5 mg by nebulization every 6 (six) hours as needed for wheezing or shortness of breath.    Camphor-Eucalyptus-Menthol (VICKS VAPORUB) 4.7-1.2-2.6 % OINT Apply 1 application topically at bedtime as needed (for congestion).    Menthol, Topical Analgesic, (ICY HOT EX) Apply 1 application topically every 8 (eight) hours as needed (for pain).    promethazine-codeine (PHENERGAN  WITH CODEINE) 6.25-10 MG/5ML syrup Take 5 mLs by mouth 4 (four) times daily as needed for cough.    sodium chloride (OCEAN) 0.65 % SOLN nasal spray Place 2 sprays into both nostrils 4 (four) times daily as needed (for dry nose).       STOP taking these medications     enalapril (VASOTEC) 20 MG tablet        No Known Allergies Follow-up Information    Follow up with Meredith Pel, MD. Schedule an appointment as soon as possible for a visit in 2 weeks.   Specialty:  Orthopedic Surgery   Contact information:   [address] [place] [address] [phone]       The results of significant diagnostics from this hospitalization (including imaging, microbiology, ancillary and laboratory) are listed below for reference.    Significant Diagnostic Studies: Dg Chest 1  View  06/20/2014   CLINICAL DATA:  Recent fall with chest pain an known left femoral fracture  EXAM: CHEST - 1 VIEW  COMPARISON:  None.  FINDINGS: Cardiac shadow is enlarged. Postsurgical changes are seen. A few calcified granulomas are noted. No focal infiltrate or sizable effusion is seen. No bony abnormality is noted.  IMPRESSION: No acute abnormality noted.   Electronically Signed   By: Inez Catalina M.D.   On: 06/20/2014 19:06   Dg Lumbar Spine Complete  06/20/2014   CLINICAL DATA:  Fall today with low back pain  EXAM: LUMBAR SPINE - COMPLETE 4+ VIEW  COMPARISON:  None.  FINDINGS: Five lumbar type vertebral bodies are well visualized. Vertebral body height is well maintained. No spondylolysis or spondylolisthesis is seen. Mild disc space narrowing is noted at L5-S1. No gross soft tissue abnormality is seen.  IMPRESSION: No acute abnormality noted.   Electronically Signed   By: Inez Catalina M.D.   On: 06/20/2014 19:05   Dg Hip Complete Left  06/20/2014   CLINICAL DATA:  Fall today with left hip pain, initial encounter  EXAM: LEFT HIP - COMPLETE 2+ VIEW  COMPARISON:  None.  FINDINGS: A subcapital left femoral neck fracture is noted with impaction and angulation at the fracture site. The pelvic ring is intact. No other focal abnormality is seen.  IMPRESSION: Left subcapital femoral neck fracture.   Electronically Signed   By: Inez Catalina M.D.   On: 06/20/2014 19:04   Dg Femur Left  06/12/2014   CLINICAL DATA:  [place] and complains of left knee and hip pain.  EXAM: LEFT FEMUR - 2 VIEW  COMPARISON:  06/12/2014 left knee  FINDINGS: Degenerative changes at the patellofemoral compartment of the knee. Multiple surgical clips in the left hemipelvis region. Degenerative changes along the superior lateral left hip joint. Negative for a fracture or dislocation. Joint space narrowing and osteophytes in the medial left knee compartment.  IMPRESSION: Negative for an acute fracture or dislocation in the left femur.   Degenerative changes at the left hip and left knee.   Electronically Signed   By: Markus Daft M.D.   On: 06/12/2014 10:20   Pelvis Portable  06/22/2014   CLINICAL DATA:  Postop  EXAM: PORTABLE PELVIS 1-2 VIEWS  COMPARISON:  06/20/2014  FINDINGS: Left hip hemiarthroplasty has been placed. Anatomic alignment. No breakage or loosening of the hardware. Osteopenia. Postoperative changes in the pelvis.  IMPRESSION: Left hip hemiarthroplasty anatomically aligned.   Electronically Signed   By: Maryclare Bean M.D.   On: 06/22/2014 21:32   Dg Knee Complete 4 Views  Left  06/12/2014   CLINICAL DATA:  Patient fell ; pain after fall  EXAM: LEFT KNEE - COMPLETE 4+ VIEW  COMPARISON:  None.  FINDINGS: Frontal, lateral, and bilateral oblique views were obtained. There is a small avulsion arising from the medial aspect of the medial proximal tibia just inferior to the plateau. The plateau itself does not appear disrupted. No other evidence of fracture. No dislocation. No appreciable joint effusion. There is moderate narrowing medially and in the patellofemoral joint. There is mild spurring in all compartments. Bones are somewhat osteoporotic. There are foci of arterial vascular calcification.  IMPRESSION: Small avulsion along the medial aspect of the proximal tibial epiphysis without involvement of the plateau surface. No other evidence of fracture. No dislocation. Moderate osteoarthritic change. No appreciable joint effusion.   Electronically Signed   By: Lowella Grip M.D.   On: 06/12/2014 10:21    Microbiology: Recent Results (from the past 240 hour(s))  Urine culture     Status: None   Collection Time: 06/21/14  2:50 AM  Result Value Ref Range Status   Specimen Description URINE, CLEAN CATCH  Final   Special Requests NONE  Final   Culture  Setup Time   Final    06/21/2014 11:19 Performed at [facility]   Final    50,000 COLONIES/ML Performed at [facility]    Culture   Final     Multiple bacterial morphotypes present, none predominant. Suggest appropriate recollection if clinically indicated. Performed at [facility]    Report Status 06/22/2014 FINAL  Final  MRSA PCR Screening     Status: None   Collection Time: 06/21/14  3:21 AM  Result Value Ref Range Status   MRSA by PCR NEGATIVE NEGATIVE Final    Comment:        The GeneXpert MRSA Assay (FDA approved for NASAL specimens only), is one component of a comprehensive MRSA colonization surveillance program. It is not intended to diagnose MRSA infection nor to guide or monitor treatment for MRSA infections.   Surgical PCR screen     Status: Abnormal   Collection Time: 06/22/14  4:19 PM  Result Value Ref Range Status   MRSA, PCR NEGATIVE NEGATIVE Final   Staphylococcus aureus POSITIVE (A) NEGATIVE Final    Comment:        The Xpert SA Assay (FDA approved for NASAL specimens in patients over 108 years of age), is one component of a comprehensive surveillance program.  Test performance has been validated by [employer] for patients greater than or equal to 42 year old. It is not intended to diagnose infection nor to guide or monitor treatment.      Labs: Basic Metabolic Panel:  Recent Labs Lab 06/20/14 1953 06/21/14 0505 06/22/14 0523 06/23/14 0440 06/24/14 0504  NA 138 138 139 140 140  K 5.3 4.9 5.1 5.4* 5.0  CL 102 105 106 106 108  CO2 22 22 24 22 23   GLUCOSE 126* 145* 131* 164* 124*  BUN 52* 54* 56* 49* 43*  CREATININE 1.63* 1.68* 1.76* 1.50* 1.65*  CALCIUM 9.5 8.9 8.9 8.7 8.5   Liver Function Tests:  Recent Labs Lab 06/20/14 1953 06/21/14 0505  AST 18 15  ALT 17 14  ALKPHOS 88 74  BILITOT 0.6 0.6  PROT 6.9 6.0  ALBUMIN 3.3* 2.8*   CBC:  Recent Labs Lab 06/20/14 1953 06/21/14 0505 06/22/14 0523 06/23/14 0440 06/24/14 0504  WBC 11.6* 8.5 8.4  10.0 9.5  NEUTROABS 9.0* 6.0  --   --   --   HGB 13.2 11.6* 11.5* 10.4* 9.8*  HCT 40.9 36.7* 36.6* 32.7* 30.9*   MCV 93.0 93.6 94.1 94.8 94.8  PLT 203 180 187 203 176   Cardiac Enzymes:  Recent Labs Lab 06/20/14 2239  CKTOTAL 153  CKMB 2.5  TROPONINI <0.30   CBG:  Recent Labs Lab 06/22/14 2151  GLUCAP 106*    Signed:  Barton Dubois  [facility] 06/24/2014, 12:14 PM

## 2014-06-24 NOTE — Progress Notes (Signed)
ANTICOAGULATION CONSULT NOTE - follow up  Pharmacy Consult for Lovenox/warfarin Indication: bridging until therapeutic INR - AFib  No Known Allergies  Patient Measurements: Height: 5\' 8"  (172.7 cm) Weight: 198 lb 3.1 oz (89.9 kg) IBW/kg (Calculated) : 68.4  Vital Signs: Temp: 97.2 F (36.2 C) (12/16 0500) Temp Source: Oral (12/16 0500) BP: 155/80 mmHg (12/16 0500) Pulse Rate: 90 (12/16 0500)  Labs:  Recent Labs  06/22/14 0523 06/22/14 1600 06/23/14 0440 06/23/14 0915 06/24/14 0504  HGB 11.5*  --  10.4*  --  9.8*  HCT 36.6*  --  32.7*  --  30.9*  PLT 187  --  203  --  176  LABPROT 22.9* 18.6*  --  16.8* 16.5*  INR 2.00* 1.53*  --  1.35 1.31  CREATININE 1.76*  --  1.50*  --  1.65*    Estimated Creatinine Clearance: 30.5 mL/min (by C-G formula based on Cr of 1.65).   Medical History: Past Medical History  Diagnosis Date  . High blood pressure   . Memory loss   . Low back pain   . Stroke   . Atrial fibrillation   . Prostate cancer   . CAD (coronary artery disease)     Medications:  Prescriptions prior to admission  Medication Sig Dispense Refill Last Dose  . carvedilol (COREG) 12.5 MG tablet Take 12.5 mg by mouth 2 (two) times daily with a meal.   06/20/2014 at 0700  . docusate sodium (COLACE) 100 MG capsule Take 100 mg by mouth at bedtime.   06/19/2014 at Unknown time  . donepezil (ARICEPT) 10 MG tablet Take 10 mg by mouth at bedtime.   06/19/2014 at Unknown time  . DULoxetine (CYMBALTA) 20 MG capsule Take 1 capsule (20 mg total) by mouth daily. 30 capsule 11 06/20/2014 at Unknown time  . enalapril (VASOTEC) 20 MG tablet Take 20 mg by mouth 2 (two) times daily.    06/20/2014 at Unknown time  . ranitidine (ZANTAC) 150 MG capsule Take 150 mg by mouth daily with breakfast.    06/20/2014 at Unknown time  . traMADol (ULTRAM) 50 MG tablet Take 50 mg by mouth 4 (four) times daily as needed for moderate pain or severe pain (pain).   06/19/2014 at Unknown time  .  warfarin (COUMADIN) 5 MG tablet Take 5 mg by mouth daily.   06/19/2014 at 1900  . albuterol (PROVENTIL) (2.5 MG/3ML) 0.083% nebulizer solution Take 2.5 mg by nebulization every 6 (six) hours as needed for wheezing or shortness of breath.   unknown at unknown time  . Camphor-Eucalyptus-Menthol (VICKS VAPORUB) 4.7-1.2-2.6 % OINT Apply 1 application topically at bedtime as needed (for congestion).   unknown at unknown time  . Menthol, Topical Analgesic, (ICY HOT EX) Apply 1 application topically every 8 (eight) hours as needed (for pain).   unknown at unknown time  . promethazine-codeine (PHENERGAN WITH CODEINE) 6.25-10 MG/5ML syrup Take 5 mLs by mouth 4 (four) times daily as needed for cough.   unknown at unknown time  . sodium chloride (OCEAN) 0.65 % SOLN nasal spray Place 2 sprays into both nostrils 4 (four) times daily as needed (for dry nose).    unknown at unknown time    Assessment: 80yo M admitted 12/12 with hip fracture after falling. Vit. K was given on 12/12, 2/13, and 12/14(total of 17.5mg ) to bring the INR down. On 12/14 the INR was 1.53 and hemiarthroplasty was performed. Pharmacy is dosing Coumadin.   Home dose: 5mg  daily  Today:  INR 1.31,  Regular diet started yesterday Hgb 9.8 Scr 1.65, CrCl~18mls/min No drug interactions noted Because of recent Vit. K, there is likely to be resistance to Coumadin for up to a week   Goal of Therapy:  INR 2-3 Monitor platelets by anticoagulation protocol: Yes   Plan:   Lovenox 80mg  sq q12h  Watch CrCl; will need to adjust if CrCl < 30 ml/min  Coumadin 7.5mg  today @ 1800  F/U AM INR - plan to d/c Lovenox once INR > 2  Dolly Rias RPh 06/24/2014, 10:26 AM Pager [phone]

## 2014-06-24 NOTE — Progress Notes (Signed)
Physical Therapy Treatment Patient Details Name: Kurt Thomas MRN: [id number] DOB: 1919/07/04 Today's Date: 06/24/2014    History of Present Illness 80 year old patient resident of assisted living facility had a mechanical fall 2 days ago. Pt s/p left hip hemiarthroplasty due to left femoral neck fracture displaced.    PT Comments    PT received page from RN to work with pt today per family request however family not present upon arrival.  RN and NT both present during session and assisted with bed mobility.  Pt requiring total assist +2 at this time.  Due to pt having no trunk control and pt sliding off bed did not stand or pivot to recliner today for pt and staff safety.  Recommend lift if OOB with nsg at this time.   Follow Up Recommendations  SNF     Equipment Recommendations  Rolling walker with 5" wheels    Recommendations for Other Services       Precautions / Restrictions Precautions Precautions: Fall;Posterior Hip Restrictions Other Position/Activity Restrictions: WBAT    Mobility  Bed Mobility Overal bed mobility: +2 for physical assistance Bed Mobility: Supine to Sit;Sit to Supine;Rolling Rolling: Total assist;+2 for physical assistance   Supine to sit: Total assist;+2 for physical assistance Sit to supine: Total assist;+2 for physical assistance   General bed mobility comments: pt given cues to assist with mobility however was not assisting requiring total assist, upon sitting EOB pt with increased L lateral lean and unable to correct as well as sliding off bed so had pt lay trunk on bed and assisted with lifting LEs off floor to keep pt from sliding off bed to floor, pt also required increased assist for positioning in bed and rolling, nsg assisted with mobility and educated to use pillow between legs for rolling to assist with maintaining hip precautions  Transfers                    Ambulation/Gait                 Stairs             Wheelchair Mobility    Modified Rankin (Stroke Patients Only)       Balance Overall balance assessment: Needs assistance Sitting-balance support: Bilateral upper extremity supported;Feet supported Sitting balance-Leahy Scale: Zero                              Cognition Arousal/Alertness: Awake/alert Behavior During Therapy: WFL for tasks assessed/performed Overall Cognitive Status: History of cognitive impairments - at baseline       Memory: Decreased short-term memory;Decreased recall of precautions              Exercises      General Comments        Pertinent Vitals/Pain Pain Assessment: Faces Faces Pain Scale: Hurts even more Pain Location: L hip Pain Descriptors / Indicators: Grimacing;Guarding Pain Intervention(s): Limited activity within patient's tolerance;Monitored during session;Repositioned    Home Living                      Prior Function            PT Goals (current goals can now be found in the care plan section) Progress towards PT goals: Progressing toward goals    Frequency  Min 3X/week    PT Plan Current plan remains appropriate    Co-evaluation  End of Session Equipment Utilized During Treatment: Left knee immobilizer Activity Tolerance: Patient limited by pain Patient left: in bed;with call bell/phone within reach;with bed alarm set     Time: 5956-3875 PT Time Calculation (min) (ACUTE ONLY): 12 min  Charges:  $Therapeutic Activity: 8-22 mins                    G Codes:      Kurt Thomas,Kurt Thomas 07/12/14, 12:50 PM Kurt Thomas, PT, DPT 12-Jul-2014 Pager: [phone]

## 2014-06-24 NOTE — Clinical Social Work Note (Signed)
CSW contacted daughter again this morning and made contact. Discussed plans for SNF and have since provided her with a list of SNF offers to consider. CSW spokje with family at bedside along with Dr. Dyann Kief and they are aware of plans for dc today- family a bit surprised at the quick dc but understand and are researching the offers to [facility] ea bed selection for dc later today.  Eduard Clos, MSW, [place]

## 2014-06-24 NOTE — Clinical Social Work Note (Signed)
Family has selected SNF bed at [facility]. Patient for d/c today. Daughter and patient agreeable to this plan- plan transfer via EMS. Eduard Clos, MSW, [place]

## 2014-06-25 ENCOUNTER — Encounter: Payer: Self-pay | Admitting: Adult Health

## 2014-06-25 ENCOUNTER — Non-Acute Institutional Stay (SKILLED_NURSING_FACILITY): Payer: Medicare Other | Admitting: Adult Health

## 2014-06-25 DIAGNOSIS — N289 Disorder of kidney and ureter, unspecified: Secondary | ICD-10-CM | POA: Diagnosis not present

## 2014-06-25 DIAGNOSIS — D62 Acute posthemorrhagic anemia: Secondary | ICD-10-CM

## 2014-06-25 DIAGNOSIS — K219 Gastro-esophageal reflux disease without esophagitis: Secondary | ICD-10-CM

## 2014-06-25 DIAGNOSIS — K5900 Constipation, unspecified: Secondary | ICD-10-CM

## 2014-06-25 DIAGNOSIS — I482 Chronic atrial fibrillation, unspecified: Secondary | ICD-10-CM

## 2014-06-25 DIAGNOSIS — I10 Essential (primary) hypertension: Secondary | ICD-10-CM | POA: Diagnosis not present

## 2014-06-25 DIAGNOSIS — F329 Major depressive disorder, single episode, unspecified: Secondary | ICD-10-CM | POA: Diagnosis not present

## 2014-06-25 DIAGNOSIS — S72002D Fracture of unspecified part of neck of left femur, subsequent encounter for closed fracture with routine healing: Secondary | ICD-10-CM

## 2014-06-25 DIAGNOSIS — F32A Depression, unspecified: Secondary | ICD-10-CM

## 2014-06-25 NOTE — Progress Notes (Signed)
Patient ID: Kurt Thomas, male   DOB: 1919-02-13, 80 y.o.   MRN: [id number]   06/25/2014  Facility:  [facility]urs[facility] Home Location:  [place] Room Number: 105-P LEVEL OF CARE:  SNF (31)   Chief Complaint  Patient presents with  . Hospitalization Follow-up    Left hip fracture S/P hemiarthroplasty, atrial fibrillation, renal insufficiency, hypertension, anemia, constipation, dementia, depression and GERD    HISTORY OF PRESENT ILLNESS:  This is a 80 year old male who was been admitted to [facility] on 06/24/14 from [facility] with left subcapital femoral fracture status post left hip hemiarthroplasty. He has been admitted for a short-term rehabilitation.  REASSESSMENT OF ONGOING PROBLEMS:  ANEMIA: The anemia has been stable. The patient denies fatigue, melena or hematochezia. No complications from the medications currently being used.  12/15 hemoglobin 9.8  GERD: pt's GERD is stable.  Denies ongoing heartburn, abd. Pain, nausea or vomiting.  Currently on a PPI & tolerates it without any adverse reactions.  DEMENTIA: The dementia remaines stable and continues to function adequately in the current living environment with supervision.  The patient has had little changes in behavior. No complications noted from the medications presently being used.   PAST MEDICAL HISTORY:  Past Medical History  Diagnosis Date  . High blood pressure   . Memory loss   . Low back pain   . Stroke   . Atrial fibrillation   . Prostate cancer   . CAD (coronary artery disease)     CURRENT MEDICATIONS: Reviewed per MAR/see medication list  No Known Allergies   REVIEW OF SYSTEMS:  GENERAL: no change in appetite, no fatigue, no weight changes, no fever, chills or weakness RESPIRATORY: no cough, SOB, DOE, wheezing, hemoptysis CARDIAC: no chest pain, edema or palpitations GI: no abdominal pain, diarrhea, heart burn, nausea or vomiting, + constipation  PHYSICAL  EXAMINATION  GENERAL: no acute distress, normal body habitus SKIN:  Left hip surgical incision is dry, no redness EYES: conjunctivae normal, sclerae normal, normal eye lids NECK: supple, trachea midline, no neck masses, no thyroid tenderness, no thyromegaly LYMPHATICS: no LAN in the neck, no supraclavicular LAN RESPIRATORY: breathing is even & unlabored, BS CTAB CARDIAC: Irregularly irregular, no murmur,no extra heart sounds, no edema GI: abdomen soft, normal BS, no masses, no tenderness, no hepatomegaly, no splenomegaly EXTREMITIES: able to move all 4 extremities PSYCHIATRIC: the patient is alert & oriented to person, affect & behavior appropriate  LABS/RADIOLOGY: Labs reviewed: Basic Metabolic Panel:  Recent Labs  06/22/14 0523 06/23/14 0440 06/24/14 0504  NA 139 140 140  K 5.1 5.4* 5.0  CL 106 106 108  CO2 24 22 23   GLUCOSE 131* 164* 124*  BUN 56* 49* 43*  CREATININE 1.76* 1.50* 1.65*  CALCIUM 8.9 8.7 8.5   Liver Function Tests:  Recent Labs  06/20/14 1953 06/21/14 0505  AST 18 15  ALT 17 14  ALKPHOS 88 74  BILITOT 0.6 0.6  PROT 6.9 6.0  ALBUMIN 3.3* 2.8*   CBC:  Recent Labs  06/20/14 1953 06/21/14 0505 06/22/14 0523 06/23/14 0440 06/24/14 0504  WBC 11.6* 8.5 8.4 10.0 9.5  NEUTROABS 9.0* 6.0  --   --   --   HGB 13.2 11.6* 11.5* 10.4* 9.8*  HCT 40.9 36.7* 36.6* 32.7* 30.9*  MCV 93.0 93.6 94.1 94.8 94.8  PLT 203 180 187 203 176    Cardiac Enzymes:  Recent Labs  06/20/14 2239  CKTOTAL 153  CKMB 2.5  TROPONINI <0.30   CBG:  Recent Labs  06/22/14 2151  GLUCAP 106*    Dg Chest 1 View  06/20/2014   CLINICAL DATA:  Recent fall with chest pain an known left femoral fracture  EXAM: CHEST - 1 VIEW  COMPARISON:  None.  FINDINGS: Cardiac shadow is enlarged. Postsurgical changes are seen. A few calcified granulomas are noted. No focal infiltrate or sizable effusion is seen. No bony abnormality is noted.  IMPRESSION: No acute abnormality noted.    Electronically Signed   By: Inez Catalina M.D.   On: 06/20/2014 19:06   Dg Lumbar Spine Complete  06/20/2014   CLINICAL DATA:  Fall today with low back pain  EXAM: LUMBAR SPINE - COMPLETE 4+ VIEW  COMPARISON:  None.  FINDINGS: Five lumbar type vertebral bodies are well visualized. Vertebral body height is well maintained. No spondylolysis or spondylolisthesis is seen. Mild disc space narrowing is noted at L5-S1. No gross soft tissue abnormality is seen.  IMPRESSION: No acute abnormality noted.   Electronically Signed   By: Inez Catalina M.D.   On: 06/20/2014 19:05   Dg Hip Complete Left  06/20/2014   CLINICAL DATA:  Fall today with left hip pain, initial encounter  EXAM: LEFT HIP - COMPLETE 2+ VIEW  COMPARISON:  None.  FINDINGS: A subcapital left femoral neck fracture is noted with impaction and angulation at the fracture site. The pelvic ring is intact. No other focal abnormality is seen.  IMPRESSION: Left subcapital femoral neck fracture.   Electronically Signed   By: Inez Catalina M.D.   On: 06/20/2014 19:04   Dg Femur Left  06/12/2014   CLINICAL DATA:  [place] and complains of left knee and hip pain.  EXAM: LEFT FEMUR - 2 VIEW  COMPARISON:  06/12/2014 left knee  FINDINGS: Degenerative changes at the patellofemoral compartment of the knee. Multiple surgical clips in the left hemipelvis region. Degenerative changes along the superior lateral left hip joint. Negative for a fracture or dislocation. Joint space narrowing and osteophytes in the medial left knee compartment.  IMPRESSION: Negative for an acute fracture or dislocation in the left femur.  Degenerative changes at the left hip and left knee.   Electronically Signed   By: Markus Daft M.D.   On: 06/12/2014 10:20   Pelvis Portable  06/22/2014   CLINICAL DATA:  Postop  EXAM: PORTABLE PELVIS 1-2 VIEWS  COMPARISON:  06/20/2014  FINDINGS: Left hip hemiarthroplasty has been placed. Anatomic alignment. No breakage or loosening of the hardware. Osteopenia.  Postoperative changes in the pelvis.  IMPRESSION: Left hip hemiarthroplasty anatomically aligned.   Electronically Signed   By: Maryclare Bean M.D.   On: 06/22/2014 21:32   Dg Knee Complete 4 Views Left  06/12/2014   CLINICAL DATA:  Patient fell ; pain after fall  EXAM: LEFT KNEE - COMPLETE 4+ VIEW  COMPARISON:  None.  FINDINGS: Frontal, lateral, and bilateral oblique views were obtained. There is a small avulsion arising from the medial aspect of the medial proximal tibia just inferior to the plateau. The plateau itself does not appear disrupted. No other evidence of fracture. No dislocation. No appreciable joint effusion. There is moderate narrowing medially and in the patellofemoral joint. There is mild spurring in all compartments. Bones are somewhat osteoporotic. There are foci of arterial vascular calcification.  IMPRESSION: Small avulsion along the medial aspect of the proximal tibial epiphysis without involvement of the plateau surface. No other evidence of fracture. No dislocation. Moderate osteoarthritic change.  No appreciable joint effusion.   Electronically Signed   By: Lowella Grip M.D.   On: 06/12/2014 10:21    ASSESSMENT/PLAN:  Left hip fracture status post hemiarthroplasty - for rehabilitation; continue Norco 5/325 mg 1-2 tabs by mouth every 6 hours when necessary and tramadol 50 mg by mouth every 6 hours when necessary for pain Atrial fibrillation - rate controlled; continue Coreg 12.5 mg by mouth twice a day and continue Coumadin with INR goal 2-3 Renal insufficiency - creatinine 1.6; BMP in 1 week Hypertension - continue Coreg 12.5 mg by mouth twice a day; Vasotec was recently discontinued due to renal insufficiency and started BiDil 20/37.5 mg by mouth twice a day Anemia, acute blood loss - hemoglobin 9.8; continue ferrous fumarate 150 mg by mouth twice a day; CBC in 1 week Constipation - increase Colace to 100 mg by mouth twice a day and MiraLAX 17 g +4-6 ounces of liquid by mouth  twice a day Dementia - continue Aricept 10 mg by mouth daily at bedtime Depression - mood is stable; continue Cymbalta at 20 mg by mouth daily GERD - continue Zantac 150 mg by mouth daily    Goals of care:  Short-term rehabilitation    Labs/test ordered: For CBC and BMP in 1 week   Spent 50 minutes in patient care.     [facility], NP [employer] [phone]

## 2014-06-25 NOTE — Discharge Summary (Signed)
Expand All Collapse All   Physician Discharge Summary  Kurt Thomas XLK:[id number] DOB: [birth year] DOA: 06/20/2014  PCP:  Melinda Crutch, MD  Admit date: 06/20/2014 Discharge date: 06/24/2014  Time spent: >30 minutes  Recommendations for Outpatient Follow-up:  Follow up with orthopedic service in 2 weeks Repeat CBC in 1 week to follow Hgb trend Repeat BMET in week to follow electrolytes and renal function Reassess BP and adjust medications as needed Please close follow up to coumadin and adjust dosage base on INR  Discharge Diagnoses:  Left hip fracture (s/p hemi-arthroplasty) Essential HTN Renal insufficiency/CKD stage 3 Chronic Atrial fibrillation Dementia  hyperkalemia AOCD with ABLA component   Discharge Condition: stable and improved. Discharge to SNF for physical rehab.   Diet recommendation: heart healthy diet  Filed Weights   06/22/14 0535 06/24/14 0500  Weight: 84 kg (185 lb 3 oz) 89.9 kg (198 lb 3.1 oz)    History of present illness:  80 year old male with a history of paroxysmal fibrillation, hypertension, dementia presents with a mechanical fall. The patient is a poor historian due to his history of dementia. His history is obtained from review of the chart and speaking with the patient's daughter at the bedside. Apparently the patient fell out of his chair onto his left side and was unable to get up. X-rays in the emergency department revealed a left subcapital femoral neck fracture. At baseline, the patient usually ambulates with cane; however, the patient had another mechanical fall on 06/12/2014 after which he began using a walker. At baseline, the patient is pleasant and confused but is able to carry on a conversation without difficulty. There was no history of syncope.  Hospital Course:  Left Subcapital Femoral Neck Fracture -appreciate ortho recommendations and assistance -06/22/2014--left hip hemiarthroplasty--By Dr. Marlou Sa -pain control to be achieve  with use of tramadol and oxycodone as needed -PT/OT has recommended SNF for rehabilitation -Follow up with Dr. Marlou Sa in 2 weeks after discharge -leave dressing in place until follow up -Weight bearing as tolerated  atrial fibrillation -Presently rate controlled -Continue carvedilol -Restart Coumadin and aim for INR goal of (2-3)  Renal insufficiency/CKD (stage 3 by GFR) -Unclear what the patient's previous renal baseline was -but base on GFR most likely stage 3 CKD -Creatinine has remained stable at 1.6 -will discontinue vasotec and will monitor BMET in 1 week after discharge -advise to follow low sodium diet  Hypertension -Continue carvedilol and discontinue vasotec (given renal failure and hyperkalemia) -will use bidil instead of vasotec -patient to follow low sodium diet  Dementia -Continue Aricept; continue supportive care -stable and at baseline  Hyperkalemia -Partly from potassium supplementation and IV fluid -potassium and rest of electrolytes WNL at discharge  Anemia: -appears to be AOCD with acute blood loss component -will monitor CBC and will start patient on ferrous sulfate  Procedures:  Left hemi-arthroplasty 06/22/14  Consultations:  Orthopedic service  Discharge Exam: Filed Vitals:   06/24/14 1104  BP:   Pulse:   Temp:   Resp: 16    General: Pt is alert X2, follows commands appropriately, not in acute distress; no fever  HEENT: No icterus, No thrush, [place]/AT  Cardiovascular: IRRR, no rubs, no gallops, no JVD  Respiratory: CTA bilaterally, no wheezing, no crackles, no rhonchi  Abdomen: Soft/+BS, non tender, non distended, no guarding  Extremities: trace edema, No lymphangitis, No petechiae, No rashes, no synovitis. Left leg dressings clean and intact   Discharge Instructions You were cared for by a hospitalist during your hospital  stay. If you have any questions about your discharge medications or the care you received while  you were in the hospital after you are discharged, you can call the unit and asked to speak with the hospitalist on call if the hospitalist that took care of you is not available. Once you are discharged, your primary care physician will handle any further medical issues. Please note that NO REFILLS for any discharge medications will be authorized once you are discharged, as it is imperative that you return to your primary care physician (or establish a relationship with a primary care physician if you do not have one) for your aftercare needs so that they can reassess your need for medications and monitor your lab values.  Discharge Instructions    Diet - low sodium heart healthy  Complete by: As directed      Discharge instructions  Complete by: As directed   Take medications as prescribed Physical rehabilitation as per SNF protocol Please check INR in 2 days and adjust further coumadin dosages (goal is INR 2-3). Patient with chronic A. Fib (currently rate controlled). Use to take coumadin 5 mg daily. Check CBC in 1 week to follow Hgb trend Check BMET in 1 week to follow electrolytes and renal function Follow up with Orthopedic service in 2 weeks after discharge     Partial weight bearing  Complete by: As directed      Partial weight bearing  Complete by: As directed           Current Discharge Medication List    START taking these medications   Details  Ferrous Fumarate 150 MG TABS Take 1 tablet (150 mg total) by mouth 2 (two) times daily.    HYDROcodone-acetaminophen (NORCO/VICODIN) 5-325 MG per tablet Take 1-2 tablets by mouth every 6 (six) hours as needed for severe pain. Qty: 30 tablet, Refills: 0    isosorbide-hydrALAZINE (BIDIL) 20-37.5 MG per tablet Take 1 tablet by mouth 2 (two) times daily.      CONTINUE these medications which have CHANGED   Details  traMADol (ULTRAM) 50 MG tablet Take 1 tablet (50 mg total) by  mouth every 6 (six) hours as needed for moderate pain (pain). Qty: 30 tablet, Refills: 0    warfarin (COUMADIN) 5 MG tablet Take 1.5 tablets (7.5 mg total) by mouth daily. To be taken for the next 2 days; then will require INR level and further coumadin adjustments.      CONTINUE these medications which have NOT CHANGED   Details  carvedilol (COREG) 12.5 MG tablet Take 12.5 mg by mouth 2 (two) times daily with a meal.    docusate sodium (COLACE) 100 MG capsule Take 100 mg by mouth at bedtime.    donepezil (ARICEPT) 10 MG tablet Take 10 mg by mouth at bedtime.    DULoxetine (CYMBALTA) 20 MG capsule Take 1 capsule (20 mg total) by mouth daily. Qty: 30 capsule, Refills: 11    ranitidine (ZANTAC) 150 MG capsule Take 150 mg by mouth daily with breakfast.     albuterol (PROVENTIL) (2.5 MG/3ML) 0.083% nebulizer solution Take 2.5 mg by nebulization every 6 (six) hours as needed for wheezing or shortness of breath.    Camphor-Eucalyptus-Menthol (VICKS VAPORUB) 4.7-1.2-2.6 % OINT Apply 1 application topically at bedtime as needed (for congestion).    Menthol, Topical Analgesic, (ICY HOT EX) Apply 1 application topically every 8 (eight) hours as needed (for pain).    promethazine-codeine (PHENERGAN WITH CODEINE) 6.25-10 MG/5ML syrup Take 5  mLs by mouth 4 (four) times daily as needed for cough.    sodium chloride (OCEAN) 0.65 % SOLN nasal spray Place 2 sprays into both nostrils 4 (four) times daily as needed (for dry nose).       STOP taking these medications     enalapril (VASOTEC) 20 MG tablet        No Known Allergies Follow-up Information    Follow up with Meredith Pel, MD. Schedule an appointment as soon as possible for a visit in 2 weeks.   Specialty: Orthopedic Surgery   Contact information:   [address] [place] [address] [phone]        The results of significant diagnostics from this  hospitalization (including imaging, microbiology, ancillary and laboratory) are listed below for reference.    Significant Diagnostic Studies:  Imaging Results    Dg Chest 1 View  06/20/2014 CLINICAL DATA: Recent fall with chest pain an known left femoral fracture EXAM: CHEST - 1 VIEW COMPARISON: None. FINDINGS: Cardiac shadow is enlarged. Postsurgical changes are seen. A few calcified granulomas are noted. No focal infiltrate or sizable effusion is seen. No bony abnormality is noted. IMPRESSION: No acute abnormality noted. Electronically Signed By: Inez Catalina M.D. On: 06/20/2014 19:06   Dg Lumbar Spine Complete  06/20/2014 CLINICAL DATA: Fall today with low back pain EXAM: LUMBAR SPINE - COMPLETE 4+ VIEW COMPARISON: None. FINDINGS: Five lumbar type vertebral bodies are well visualized. Vertebral body height is well maintained. No spondylolysis or spondylolisthesis is seen. Mild disc space narrowing is noted at L5-S1. No gross soft tissue abnormality is seen. IMPRESSION: No acute abnormality noted. Electronically Signed By: Inez Catalina M.D. On: 06/20/2014 19:05   Dg Hip Complete Left  06/20/2014 CLINICAL DATA: Fall today with left hip pain, initial encounter EXAM: LEFT HIP - COMPLETE 2+ VIEW COMPARISON: None. FINDINGS: A subcapital left femoral neck fracture is noted with impaction and angulation at the fracture site. The pelvic ring is intact. No other focal abnormality is seen. IMPRESSION: Left subcapital femoral neck fracture. Electronically Signed By: Inez Catalina M.D. On: 06/20/2014 19:04   Dg Femur Left  06/12/2014 CLINICAL DATA: [place] and complains of left knee and hip pain. EXAM: LEFT FEMUR - 2 VIEW COMPARISON: 06/12/2014 left knee FINDINGS: Degenerative changes at the patellofemoral compartment of the knee. Multiple surgical clips in the left hemipelvis region. Degenerative changes along the superior lateral left hip joint. Negative for a  fracture or dislocation. Joint space narrowing and osteophytes in the medial left knee compartment. IMPRESSION: Negative for an acute fracture or dislocation in the left femur. Degenerative changes at the left hip and left knee. Electronically Signed By: Markus Daft M.D. On: 06/12/2014 10:20   Pelvis Portable  06/22/2014 CLINICAL DATA: Postop EXAM: PORTABLE PELVIS 1-2 VIEWS COMPARISON: 06/20/2014 FINDINGS: Left hip hemiarthroplasty has been placed. Anatomic alignment. No breakage or loosening of the hardware. Osteopenia. Postoperative changes in the pelvis. IMPRESSION: Left hip hemiarthroplasty anatomically aligned. Electronically Signed By: Maryclare Bean M.D. On: 06/22/2014 21:32   Dg Knee Complete 4 Views Left  06/12/2014 CLINICAL DATA: Patient fell ; pain after fall EXAM: LEFT KNEE - COMPLETE 4+ VIEW COMPARISON: None. FINDINGS: Frontal, lateral, and bilateral oblique views were obtained. There is a small avulsion arising from the medial aspect of the medial proximal tibia just inferior to the plateau. The plateau itself does not appear disrupted. No other evidence of fracture. No dislocation. No appreciable joint effusion. There is moderate narrowing medially and in the patellofemoral joint.  There is mild spurring in all compartments. Bones are somewhat osteoporotic. There are foci of arterial vascular calcification. IMPRESSION: Small avulsion along the medial aspect of the proximal tibial epiphysis without involvement of the plateau surface. No other evidence of fracture. No dislocation. Moderate osteoarthritic change. No appreciable joint effusion. Electronically Signed By: Lowella Grip M.D. On: 06/12/2014 10:21     Microbiology: Recent Results (from the past 240 hour(s))  Urine culture Status: None   Collection Time: 06/21/14 2:50 AM  Result Value Ref Range Status   Specimen Description URINE, CLEAN CATCH  Final   Special Requests NONE   Final   Culture Setup Time   Final    06/21/2014 11:19 Performed at [place]   Final    50,000 COLONIES/ML Performed at [facility]    Culture   Final    Multiple bacterial morphotypes present, none predominant. Suggest appropriate recollection if clinically indicated. Performed at [facility]    Report Status 06/22/2014 FINAL  Final  MRSA PCR Screening Status: None   Collection Time: 06/21/14 3:21 AM  Result Value Ref Range Status   MRSA by PCR NEGATIVE NEGATIVE Final    Comment:   The GeneXpert MRSA Assay (FDA approved for NASAL specimens only), is one component of a comprehensive MRSA colonization surveillance program. It is not intended to diagnose MRSA infection nor to guide or monitor treatment for MRSA infections.   Surgical PCR screen Status: Abnormal   Collection Time: 06/22/14 4:19 PM  Result Value Ref Range Status   MRSA, PCR NEGATIVE NEGATIVE Final   Staphylococcus aureus POSITIVE (A) NEGATIVE Final    Comment:   The Xpert SA Assay (FDA approved for NASAL specimens in patients over 27 years of age), is one component of a comprehensive surveillance program. Test performance has been validated by [employer] for patients greater than or equal to 52 year old. It is not intended to diagnose infection nor to guide or monitor treatment.      Labs: Basic Metabolic Panel:  Last Labs      Recent Labs Lab 06/20/14 1953 06/21/14 0505 06/22/14 0523 06/23/14 0440 06/24/14 0504  NA 138 138 139 140 140  K 5.3 4.9 5.1 5.4* 5.0  CL 102 105 106 106 108  CO2 22 22 24 22 23   GLUCOSE 126* 145* 131* 164* 124*  BUN 52* 54* 56* 49* 43*  CREATININE 1.63* 1.68* 1.76* 1.50* 1.65*  CALCIUM 9.5 8.9 8.9 8.7 8.5     Liver Function Tests:  Last Labs      Recent Labs Lab  06/20/14 1953 06/21/14 0505  AST 18 15  ALT 17 14  ALKPHOS 88 74  BILITOT 0.6 0.6  PROT 6.9 6.0  ALBUMIN 3.3* 2.8*     CBC:  Last Labs      Recent Labs Lab 06/20/14 1953 06/21/14 0505 06/22/14 0523 06/23/14 0440 06/24/14 0504  WBC 11.6* 8.5 8.4 10.0 9.5  NEUTROABS 9.0* 6.0 --  --  --   HGB 13.2 11.6* 11.5* 10.4* 9.8*  HCT 40.9 36.7* 36.6* 32.7* 30.9*  MCV 93.0 93.6 94.1 94.8 94.8  PLT 203 180 187 203 176     Cardiac Enzymes:  Last Labs      Recent Labs Lab 06/20/14 2239  CKTOTAL 153  CKMB 2.5  TROPONINI <0.30     CBG:  Last Labs      Recent Labs Lab 06/22/14 2151  GLUCAP 106*  Signed:  Barton Dubois [facility] 06/24/2014, 12:14 PM

## 2014-06-26 ENCOUNTER — Encounter: Payer: Self-pay | Admitting: Internal Medicine

## 2014-06-26 ENCOUNTER — Non-Acute Institutional Stay (SKILLED_NURSING_FACILITY): Payer: Medicare Other | Admitting: Internal Medicine

## 2014-06-26 DIAGNOSIS — F329 Major depressive disorder, single episode, unspecified: Secondary | ICD-10-CM | POA: Diagnosis not present

## 2014-06-26 DIAGNOSIS — S72002S Fracture of unspecified part of neck of left femur, sequela: Secondary | ICD-10-CM | POA: Diagnosis not present

## 2014-06-26 DIAGNOSIS — K219 Gastro-esophageal reflux disease without esophagitis: Secondary | ICD-10-CM | POA: Diagnosis not present

## 2014-06-26 DIAGNOSIS — I482 Chronic atrial fibrillation, unspecified: Secondary | ICD-10-CM

## 2014-06-26 DIAGNOSIS — N289 Disorder of kidney and ureter, unspecified: Secondary | ICD-10-CM

## 2014-06-26 DIAGNOSIS — D62 Acute posthemorrhagic anemia: Secondary | ICD-10-CM | POA: Diagnosis not present

## 2014-06-26 DIAGNOSIS — F0280 Dementia in other diseases classified elsewhere without behavioral disturbance: Secondary | ICD-10-CM

## 2014-06-26 DIAGNOSIS — K5900 Constipation, unspecified: Secondary | ICD-10-CM | POA: Diagnosis not present

## 2014-06-26 DIAGNOSIS — F0390 Unspecified dementia without behavioral disturbance: Secondary | ICD-10-CM

## 2014-06-26 DIAGNOSIS — I10 Essential (primary) hypertension: Secondary | ICD-10-CM

## 2014-06-26 DIAGNOSIS — F0393 Unspecified dementia, unspecified severity, with mood disturbance: Secondary | ICD-10-CM

## 2014-06-26 NOTE — Progress Notes (Signed)
Patient ID: Kurt Thomas, male   DOB: [birth year], 80 y.o.   MRN: [id number]     [facility]   PCP:  Melinda Crutch, MD  Code Status: full code  No Known Allergies  Chief Complaint  Patient presents with  . New Admit To SNF     HPI:  80 y/o malept is here for STR post hospital admission from 06/20/14-06/24/14 with left subcapital femoral neck fracture. He is s/p left hip hemiarthroplasty. His pain is currently under control. inr today is 1.9 and he is on coumadin 7.5 mg daily. He has been working with therapy team. He denies any concerns. He is alert but confused. He has pmh of afib, HTN, dementia  Review of Systems:  Constitutional: Negative for fever, chills, diaphoresis.  Respiratory: Negative for cough, sputum production, shortness of breath and wheezing.   Cardiovascular: Negative for chest pain, palpitations, orthopnea and leg swelling.  Gastrointestinal: Negative for heartburn, nausea, vomiting, abdominal pain Genitourinary: Negative for dysuria Musculoskeletal: Negative for back pain, falls Skin: Negative for itching, rash.  Neurological: Negative for dizziness, headaches.  Psychiatric/Behavioral: Negative for depression. Has memory loss  Past Medical History  Diagnosis Date  . High blood pressure   . Memory loss   . Low back pain   . Stroke   . Atrial fibrillation   . Prostate cancer   . CAD (coronary artery disease)    Past Surgical History  Procedure Laterality Date  . Coronary artery bypass graft    . Prostatectomy    . Cataract extraction    . Hip arthroplasty Left 06/22/2014    Procedure: LEFT HEMIARTHROPLASTY HIP;  Surgeon: Meredith Pel, MD;  Location: [facility];  Service: Orthopedics;  Laterality: Left;   Social History:   reports that he quit smoking about 58 years ago. His smoking use included Cigarettes. He has a 1 pack-year smoking history. He has never used smokeless tobacco. He reports that he does not drink alcohol  or use illicit drugs.  Family History  Problem Relation Age of Onset  . High blood pressure    . Cancer Father     Medications: Patient's Medications  New Prescriptions   No medications on file  Previous Medications   ALBUTEROL (PROVENTIL) (2.5 MG/3ML) 0.083% NEBULIZER SOLUTION    Take 2.5 mg by nebulization every 6 (six) hours as needed for wheezing or shortness of breath.   CAMPHOR-EUCALYPTUS-MENTHOL (VICKS VAPORUB) 4.7-1.2-2.6 % OINT    Apply 1 application topically at bedtime as needed (for congestion).   CARVEDILOL (COREG) 12.5 MG TABLET    Take 12.5 mg by mouth 2 (two) times daily with a meal.   DOCUSATE SODIUM (COLACE) 100 MG CAPSULE    Take 100 mg by mouth at bedtime.   DONEPEZIL (ARICEPT) 10 MG TABLET    Take 10 mg by mouth at bedtime.   DULOXETINE (CYMBALTA) 20 MG CAPSULE    Take 1 capsule (20 mg total) by mouth daily.   FERROUS FUMARATE 150 MG TABS    Take 1 tablet (150 mg total) by mouth 2 (two) times daily.   HYDROCODONE-ACETAMINOPHEN (NORCO/VICODIN) 5-325 MG PER TABLET    Take 1-2 tablets by mouth every 6 (six) hours as needed for severe pain.   ISOSORBIDE-HYDRALAZINE (BIDIL) 20-37.5 MG PER TABLET    Take 1 tablet by mouth 2 (two) times daily.   MENTHOL, TOPICAL ANALGESIC, (ICY HOT EX)    Apply 1 application topically every 8 (eight) hours as needed (for  pain).   PROMETHAZINE-CODEINE (PHENERGAN WITH CODEINE) 6.25-10 MG/5ML SYRUP    Take 5 mLs by mouth 4 (four) times daily as needed for cough.   RANITIDINE (ZANTAC) 150 MG CAPSULE    Take 150 mg by mouth daily with breakfast.    SODIUM CHLORIDE (OCEAN) 0.65 % SOLN NASAL SPRAY    Place 2 sprays into both nostrils 4 (four) times daily as needed (for dry nose).    TRAMADOL (ULTRAM) 50 MG TABLET    Take 1 tablet (50 mg total) by mouth every 6 (six) hours as needed for moderate pain (pain).   WARFARIN (COUMADIN) 5 MG TABLET    Take 1.5 tablets (7.5 mg total) by mouth daily. To be taken for the next 2 days; then will require INR  level and further coumadin adjustments.  Modified Medications   No medications on file  Discontinued Medications   No medications on file     Physical Exam: Filed Vitals:   06/26/14 1408  BP: 140/64  Pulse: 83  Temp: 97.8 F (36.6 C)  Resp: 18  SpO2: 97%    General- elderly male in no acute distress Head- atraumatic, normocephalic Eyes-  no pallor, no icterus, no discharge Neck- no cervical lymphadenopathy Throat- moist mucus membrane Cardiovascular- normal s1,s2, no murmurs, palpable dorsalis pedis Respiratory- bilateral clear to auscultation, no wheeze, no rhonchi, no crackles, no use of accessory muscles Abdomen- bowel sounds present, soft, non tender Musculoskeletal- able to move all 4 extremities, left leg rom limited,  no leg edema Neurological- no focal deficit Skin- warm and dry Psychiatry- alert and oriented to person only  Labs reviewed: Basic Metabolic Panel:  Recent Labs  06/22/14 0523 06/23/14 0440 06/24/14 0504  NA 139 140 140  K 5.1 5.4* 5.0  CL 106 106 108  CO2 24 22 23   GLUCOSE 131* 164* 124*  BUN 56* 49* 43*  CREATININE 1.76* 1.50* 1.65*  CALCIUM 8.9 8.7 8.5   Liver Function Tests:  Recent Labs  06/20/14 1953 06/21/14 0505  AST 18 15  ALT 17 14  ALKPHOS 88 74  BILITOT 0.6 0.6  PROT 6.9 6.0  ALBUMIN 3.3* 2.8*   No results for input(s): LIPASE, AMYLASE in the last 8760 hours. No results for input(s): AMMONIA in the last 8760 hours. CBC:  Recent Labs  06/20/14 1953 06/21/14 0505 06/22/14 0523 06/23/14 0440 06/24/14 0504  WBC 11.6* 8.5 8.4 10.0 9.5  NEUTROABS 9.0* 6.0  --   --   --   HGB 13.2 11.6* 11.5* 10.4* 9.8*  HCT 40.9 36.7* 36.6* 32.7* 30.9*  MCV 93.0 93.6 94.1 94.8 94.8  PLT 203 180 187 203 176   Cardiac Enzymes:  Recent Labs  06/20/14 2239  CKTOTAL 153  CKMB 2.5  TROPONINI <0.30   BNP: Invalid input(s): POCBNP CBG:  Recent Labs  06/22/14 2151  GLUCAP 106*    Assessment/Plan  Left hip fracture   status post hemiarthroplasty. Will have him work with physical therapy and occupational therapy team to help with gait training and muscle strengthening exercises.fall precautions. Skin care. Encourage to be out of bed. continue Norco 5/325 mg 1-2 tabs by mouth every 6 hours prn and tramadol 50 mg by mouth every 6 hours prn for pain. On coumadin for dvt prophylaxis, has f/u with orthopedics. He is PWB on left leg  Atrial fibrillation rate controlled. continue Coreg 12.5 mg bid and Coumadin with INR goal 2-3  Constipation  Continue Colace to 100 mg bid and miralax bid  Acute renal impairment Recheck bmp. Avoid nsaids. Encouraged hydration  Anemia, acute blood loss post op, on iron supplement, check cbc in 1 week  Hypertension Stable readings. continue Coreg 12.5 mg bid and bidil 20/37.5 mg bid  GERD  continue Zantac 150 mg by mouth daily    Dementia  continue Aricept 10 mg by mouth daily   Depression Stable, continue Cymbalta 20 mg by mouth daily    Family/ staff Communication: reviewed care plan with patient and nursing supervisor  Goals of care: short term rehabilitation   Labs/tests ordered: cbc, bmp in 1 week    Blanchie Serve, MD  [place] [phone] (Monday-Friday 8 am - 5 pm) [phone] (afterhours)

## 2014-07-02 DIAGNOSIS — S72002D Fracture of unspecified part of neck of left femur, subsequent encounter for closed fracture with routine healing: Secondary | ICD-10-CM | POA: Diagnosis not present

## 2014-07-17 ENCOUNTER — Encounter: Payer: Self-pay | Admitting: Adult Health

## 2014-07-17 ENCOUNTER — Non-Acute Institutional Stay (SKILLED_NURSING_FACILITY): Payer: Medicare Other | Admitting: Adult Health

## 2014-07-17 DIAGNOSIS — D62 Acute posthemorrhagic anemia: Secondary | ICD-10-CM | POA: Diagnosis not present

## 2014-07-17 DIAGNOSIS — I482 Chronic atrial fibrillation, unspecified: Secondary | ICD-10-CM

## 2014-07-17 DIAGNOSIS — I10 Essential (primary) hypertension: Secondary | ICD-10-CM

## 2014-07-17 DIAGNOSIS — N289 Disorder of kidney and ureter, unspecified: Secondary | ICD-10-CM

## 2014-07-17 DIAGNOSIS — K219 Gastro-esophageal reflux disease without esophagitis: Secondary | ICD-10-CM

## 2014-07-17 DIAGNOSIS — S72002S Fracture of unspecified part of neck of left femur, sequela: Secondary | ICD-10-CM | POA: Diagnosis not present

## 2014-07-17 DIAGNOSIS — K5900 Constipation, unspecified: Secondary | ICD-10-CM | POA: Diagnosis not present

## 2014-07-17 DIAGNOSIS — F0390 Unspecified dementia without behavioral disturbance: Secondary | ICD-10-CM

## 2014-07-17 DIAGNOSIS — F32A Depression, unspecified: Secondary | ICD-10-CM

## 2014-07-17 DIAGNOSIS — F329 Major depressive disorder, single episode, unspecified: Secondary | ICD-10-CM

## 2014-07-17 NOTE — Progress Notes (Signed)
Patient ID: Kurt Thomas, male   DOB: 08/28/1918, 81 y.o.   MRN: [id number]   07/17/2014  Facility:  [facility] Home Location:  [place] Room Number: 105-P LEVEL OF CARE:  SNF (31)   Chief Complaint  Patient presents with  . Discharge Note    Left hip fracture S/P hemiarthroplasty, atrial fibrillation, renal insufficiency, hypertension, anemia, constipation, dementia, depression and GERD       HISTORY OF PRESENT ILLNESS:  This is a 81 year old male who is for discharge home with Home health PT, OT and Nursing. He has been admitted to [facility] on 06/24/14 from [facility] with left subcapital femoral fracture status post left hip hemiarthroplasty. Patient was admitted to this facility for short-term rehabilitation after the patient's recent hospitalization.  Patient has completed SNF rehabilitation and therapy has cleared the patient for discharge.  REASSESSMENT OF ONGOING PROBLEMS:  ANEMIA: The anemia has been stable. The patient denies fatigue, melena or hematochezia. No complications from the medications currently being used.  12/15 hemoglobin 10.3  GERD: pt's GERD is stable.  Denies ongoing heartburn, abd. Pain, nausea or vomiting.  Currently on a PPI & tolerates it without any adverse reactions.  DEMENTIA: The dementia remaines stable and continues to function adequately in the current living environment with supervision.  The patient has had little changes in behavior. No complications noted from the medications presently being used.   PAST MEDICAL HISTORY:  Past Medical History  Diagnosis Date  . High blood pressure   . Memory loss   . Low back pain   . Stroke   . Atrial fibrillation   . Prostate cancer   . CAD (coronary artery disease)     CURRENT MEDICATIONS: Reviewed per MAR/see medication list  No Known Allergies   REVIEW OF SYSTEMS:  GENERAL: no change in appetite, no fatigue, no weight changes, no fever, chills or  weakness RESPIRATORY: no cough, SOB, DOE, wheezing, hemoptysis CARDIAC: no chest pain, edema or palpitations GI: no abdominal pain, diarrhea, heart burn, nausea or vomiting  PHYSICAL EXAMINATION  GENERAL: no acute distress, normal body habitus SKIN:  Left hip surgical incision is dry, no redness NECK: supple, trachea midline, no neck masses, no thyroid tenderness, no thyromegaly LYMPHATICS: no LAN in the neck, no supraclavicular LAN RESPIRATORY: breathing is even & unlabored, BS CTAB CARDIAC: Irregularly irregular, no murmur,no extra heart sounds, no edema GI: abdomen soft, normal BS, no masses, no tenderness, no hepatomegaly, no splenomegaly EXTREMITIES: able to move all 4 extremities PSYCHIATRIC: the patient is alert & oriented to person, affect & behavior appropriate  LABS/RADIOLOGY: 07/08/14  WBC 5.2 hemoglobin 10.3 hematocrit 33.1 MCV 98.5 sodium 137 potassium 4.2 glucose 108 BUN 14 creatinine 1.6 calcium 9 point GFR 42.40 Labs reviewed: Basic Metabolic Panel:  Recent Labs  06/22/14 0523 06/23/14 0440 06/24/14 0504  NA 139 140 140  K 5.1 5.4* 5.0  CL 106 106 108  CO2 24 22 23   GLUCOSE 131* 164* 124*  BUN 56* 49* 43*  CREATININE 1.76* 1.50* 1.65*  CALCIUM 8.9 8.7 8.5   Liver Function Tests:  Recent Labs  06/20/14 1953 06/21/14 0505  AST 18 15  ALT 17 14  ALKPHOS 88 74  BILITOT 0.6 0.6  PROT 6.9 6.0  ALBUMIN 3.3* 2.8*   CBC:  Recent Labs  06/20/14 1953 06/21/14 0505 06/22/14 0523 06/23/14 0440 06/24/14 0504  WBC 11.6* 8.5 8.4 10.0 9.5  NEUTROABS 9.0* 6.0  --   --   --  HGB 13.2 11.6* 11.5* 10.4* 9.8*  HCT 40.9 36.7* 36.6* 32.7* 30.9*  MCV 93.0 93.6 94.1 94.8 94.8  PLT 203 180 187 203 176    Cardiac Enzymes:  Recent Labs  06/20/14 2239  CKTOTAL 153  CKMB 2.5  TROPONINI <0.30   CBG:  Recent Labs  06/22/14 2151  GLUCAP 106*    Dg Chest 1 View  06/20/2014   CLINICAL DATA:  Recent fall with chest pain an known left femoral fracture   EXAM: CHEST - 1 VIEW  COMPARISON:  None.  FINDINGS: Cardiac shadow is enlarged. Postsurgical changes are seen. A few calcified granulomas are noted. No focal infiltrate or sizable effusion is seen. No bony abnormality is noted.  IMPRESSION: No acute abnormality noted.   Electronically Signed   By: Inez Catalina M.D.   On: 06/20/2014 19:06   Dg Lumbar Spine Complete  06/20/2014   CLINICAL DATA:  Fall today with low back pain  EXAM: LUMBAR SPINE - COMPLETE 4+ VIEW  COMPARISON:  None.  FINDINGS: Five lumbar type vertebral bodies are well visualized. Vertebral body height is well maintained. No spondylolysis or spondylolisthesis is seen. Mild disc space narrowing is noted at L5-S1. No gross soft tissue abnormality is seen.  IMPRESSION: No acute abnormality noted.   Electronically Signed   By: Inez Catalina M.D.   On: 06/20/2014 19:05   Dg Hip Complete Left  06/20/2014   CLINICAL DATA:  Fall today with left hip pain, initial encounter  EXAM: LEFT HIP - COMPLETE 2+ VIEW  COMPARISON:  None.  FINDINGS: A subcapital left femoral neck fracture is noted with impaction and angulation at the fracture site. The pelvic ring is intact. No other focal abnormality is seen.  IMPRESSION: Left subcapital femoral neck fracture.   Electronically Signed   By: Inez Catalina M.D.   On: 06/20/2014 19:04   Pelvis Portable  06/22/2014   CLINICAL DATA:  Postop  EXAM: PORTABLE PELVIS 1-2 VIEWS  COMPARISON:  06/20/2014  FINDINGS: Left hip hemiarthroplasty has been placed. Anatomic alignment. No breakage or loosening of the hardware. Osteopenia. Postoperative changes in the pelvis.  IMPRESSION: Left hip hemiarthroplasty anatomically aligned.   Electronically Signed   By: Maryclare Bean M.D.   On: 06/22/2014 21:32    ASSESSMENT/PLAN:  Left hip fracture status post hemiarthroplasty - for home health PT, OT and nursing; continue Norco 5/325 mg 1-2 tabs by mouth every 6 hours when necessary and tramadol 50 mg by mouth every 6 hours when  necessary for pain Atrial fibrillation - rate controlled; continue Coreg 12.5 mg by mouth twice a day and continue Coumadin with INR goal 2-3 Renal insufficiency - creatinine 1.6; stable Hypertension - continue Coreg 12.5 mg by mouth twice a day and BiDil 20/37.5 mg by mouth twice a day Anemia, acute blood loss - hemoglobin 10.3; continue ferrous fumarate 150 mg by mouth twice a day Constipation - continue Colace to 100 mg by mouth twice a day and MiraLAX 17 g +4-6 ounces of liquid by mouth twice a day Dementia - continue Aricept 10 mg by mouth daily at bedtime Depression - mood is stable; continue Cymbalta at 20 mg by mouth daily GERD - recently increased Zantac 150 mg by mouth BID   I have filled out patient's discharge paperwork and written prescriptions.  Patient will receive home health PT, OT and Nursing.  Total discharge time: Less than 30 minutes  Discharge time involved coordination of the discharge process with Education officer, museum, nursing  staff and therapy department. Medical justification for home health services verified.    [facility] NP [employer] [phone]

## 2014-07-21 DIAGNOSIS — M6281 Muscle weakness (generalized): Secondary | ICD-10-CM | POA: Diagnosis not present

## 2014-07-21 DIAGNOSIS — R279 Unspecified lack of coordination: Secondary | ICD-10-CM | POA: Diagnosis not present

## 2014-07-21 DIAGNOSIS — Z7901 Long term (current) use of anticoagulants: Secondary | ICD-10-CM | POA: Diagnosis not present

## 2014-07-21 DIAGNOSIS — I482 Chronic atrial fibrillation: Secondary | ICD-10-CM | POA: Diagnosis not present

## 2014-07-21 DIAGNOSIS — R262 Difficulty in walking, not elsewhere classified: Secondary | ICD-10-CM | POA: Diagnosis not present

## 2014-07-21 DIAGNOSIS — R296 Repeated falls: Secondary | ICD-10-CM | POA: Diagnosis not present

## 2014-07-22 DIAGNOSIS — R279 Unspecified lack of coordination: Secondary | ICD-10-CM | POA: Diagnosis not present

## 2014-07-22 DIAGNOSIS — I4891 Unspecified atrial fibrillation: Secondary | ICD-10-CM | POA: Diagnosis not present

## 2014-07-22 DIAGNOSIS — R262 Difficulty in walking, not elsewhere classified: Secondary | ICD-10-CM | POA: Diagnosis not present

## 2014-07-22 DIAGNOSIS — Z7901 Long term (current) use of anticoagulants: Secondary | ICD-10-CM | POA: Diagnosis not present

## 2014-07-22 DIAGNOSIS — R296 Repeated falls: Secondary | ICD-10-CM | POA: Diagnosis not present

## 2014-07-22 DIAGNOSIS — M6281 Muscle weakness (generalized): Secondary | ICD-10-CM | POA: Diagnosis not present

## 2014-07-23 DIAGNOSIS — I482 Chronic atrial fibrillation: Secondary | ICD-10-CM | POA: Diagnosis not present

## 2014-07-23 DIAGNOSIS — Z7901 Long term (current) use of anticoagulants: Secondary | ICD-10-CM | POA: Diagnosis not present

## 2014-07-23 DIAGNOSIS — R279 Unspecified lack of coordination: Secondary | ICD-10-CM | POA: Diagnosis not present

## 2014-07-23 DIAGNOSIS — R296 Repeated falls: Secondary | ICD-10-CM | POA: Diagnosis not present

## 2014-07-23 DIAGNOSIS — R262 Difficulty in walking, not elsewhere classified: Secondary | ICD-10-CM | POA: Diagnosis not present

## 2014-07-23 DIAGNOSIS — M6281 Muscle weakness (generalized): Secondary | ICD-10-CM | POA: Diagnosis not present

## 2014-07-24 DIAGNOSIS — R296 Repeated falls: Secondary | ICD-10-CM | POA: Diagnosis not present

## 2014-07-24 DIAGNOSIS — Z7901 Long term (current) use of anticoagulants: Secondary | ICD-10-CM | POA: Diagnosis not present

## 2014-07-24 DIAGNOSIS — R279 Unspecified lack of coordination: Secondary | ICD-10-CM | POA: Diagnosis not present

## 2014-07-24 DIAGNOSIS — I10 Essential (primary) hypertension: Secondary | ICD-10-CM | POA: Diagnosis not present

## 2014-07-24 DIAGNOSIS — L570 Actinic keratosis: Secondary | ICD-10-CM | POA: Diagnosis not present

## 2014-07-24 DIAGNOSIS — M6281 Muscle weakness (generalized): Secondary | ICD-10-CM | POA: Diagnosis not present

## 2014-07-24 DIAGNOSIS — J309 Allergic rhinitis, unspecified: Secondary | ICD-10-CM | POA: Diagnosis not present

## 2014-07-24 DIAGNOSIS — R451 Restlessness and agitation: Secondary | ICD-10-CM | POA: Diagnosis not present

## 2014-07-24 DIAGNOSIS — R262 Difficulty in walking, not elsewhere classified: Secondary | ICD-10-CM | POA: Diagnosis not present

## 2014-07-24 DIAGNOSIS — I482 Chronic atrial fibrillation: Secondary | ICD-10-CM | POA: Diagnosis not present

## 2014-07-24 DIAGNOSIS — I4891 Unspecified atrial fibrillation: Secondary | ICD-10-CM | POA: Diagnosis not present

## 2014-07-24 DIAGNOSIS — I679 Cerebrovascular disease, unspecified: Secondary | ICD-10-CM | POA: Diagnosis not present

## 2014-07-27 DIAGNOSIS — R296 Repeated falls: Secondary | ICD-10-CM | POA: Diagnosis not present

## 2014-07-27 DIAGNOSIS — R262 Difficulty in walking, not elsewhere classified: Secondary | ICD-10-CM | POA: Diagnosis not present

## 2014-07-27 DIAGNOSIS — M6281 Muscle weakness (generalized): Secondary | ICD-10-CM | POA: Diagnosis not present

## 2014-07-27 DIAGNOSIS — R279 Unspecified lack of coordination: Secondary | ICD-10-CM | POA: Diagnosis not present

## 2014-07-28 DIAGNOSIS — R262 Difficulty in walking, not elsewhere classified: Secondary | ICD-10-CM | POA: Diagnosis not present

## 2014-07-28 DIAGNOSIS — M6281 Muscle weakness (generalized): Secondary | ICD-10-CM | POA: Diagnosis not present

## 2014-07-28 DIAGNOSIS — R279 Unspecified lack of coordination: Secondary | ICD-10-CM | POA: Diagnosis not present

## 2014-07-28 DIAGNOSIS — R296 Repeated falls: Secondary | ICD-10-CM | POA: Diagnosis not present

## 2014-07-29 DIAGNOSIS — R279 Unspecified lack of coordination: Secondary | ICD-10-CM | POA: Diagnosis not present

## 2014-07-29 DIAGNOSIS — R296 Repeated falls: Secondary | ICD-10-CM | POA: Diagnosis not present

## 2014-07-29 DIAGNOSIS — R262 Difficulty in walking, not elsewhere classified: Secondary | ICD-10-CM | POA: Diagnosis not present

## 2014-07-29 DIAGNOSIS — M6281 Muscle weakness (generalized): Secondary | ICD-10-CM | POA: Diagnosis not present

## 2014-07-30 DIAGNOSIS — R279 Unspecified lack of coordination: Secondary | ICD-10-CM | POA: Diagnosis not present

## 2014-07-30 DIAGNOSIS — R262 Difficulty in walking, not elsewhere classified: Secondary | ICD-10-CM | POA: Diagnosis not present

## 2014-07-30 DIAGNOSIS — M6281 Muscle weakness (generalized): Secondary | ICD-10-CM | POA: Diagnosis not present

## 2014-07-30 DIAGNOSIS — R296 Repeated falls: Secondary | ICD-10-CM | POA: Diagnosis not present

## 2014-08-03 DIAGNOSIS — I482 Chronic atrial fibrillation: Secondary | ICD-10-CM | POA: Diagnosis not present

## 2014-08-03 DIAGNOSIS — R262 Difficulty in walking, not elsewhere classified: Secondary | ICD-10-CM | POA: Diagnosis not present

## 2014-08-03 DIAGNOSIS — M6281 Muscle weakness (generalized): Secondary | ICD-10-CM | POA: Diagnosis not present

## 2014-08-03 DIAGNOSIS — R279 Unspecified lack of coordination: Secondary | ICD-10-CM | POA: Diagnosis not present

## 2014-08-03 DIAGNOSIS — Z7901 Long term (current) use of anticoagulants: Secondary | ICD-10-CM | POA: Diagnosis not present

## 2014-08-03 DIAGNOSIS — R296 Repeated falls: Secondary | ICD-10-CM | POA: Diagnosis not present

## 2014-08-04 DIAGNOSIS — R279 Unspecified lack of coordination: Secondary | ICD-10-CM | POA: Diagnosis not present

## 2014-08-04 DIAGNOSIS — R296 Repeated falls: Secondary | ICD-10-CM | POA: Diagnosis not present

## 2014-08-04 DIAGNOSIS — R262 Difficulty in walking, not elsewhere classified: Secondary | ICD-10-CM | POA: Diagnosis not present

## 2014-08-04 DIAGNOSIS — M6281 Muscle weakness (generalized): Secondary | ICD-10-CM | POA: Diagnosis not present

## 2014-08-05 DIAGNOSIS — M6281 Muscle weakness (generalized): Secondary | ICD-10-CM | POA: Diagnosis not present

## 2014-08-05 DIAGNOSIS — R279 Unspecified lack of coordination: Secondary | ICD-10-CM | POA: Diagnosis not present

## 2014-08-05 DIAGNOSIS — R296 Repeated falls: Secondary | ICD-10-CM | POA: Diagnosis not present

## 2014-08-05 DIAGNOSIS — R262 Difficulty in walking, not elsewhere classified: Secondary | ICD-10-CM | POA: Diagnosis not present

## 2014-08-06 DIAGNOSIS — R262 Difficulty in walking, not elsewhere classified: Secondary | ICD-10-CM | POA: Diagnosis not present

## 2014-08-06 DIAGNOSIS — R279 Unspecified lack of coordination: Secondary | ICD-10-CM | POA: Diagnosis not present

## 2014-08-06 DIAGNOSIS — M6281 Muscle weakness (generalized): Secondary | ICD-10-CM | POA: Diagnosis not present

## 2014-08-06 DIAGNOSIS — R296 Repeated falls: Secondary | ICD-10-CM | POA: Diagnosis not present

## 2014-08-07 DIAGNOSIS — R296 Repeated falls: Secondary | ICD-10-CM | POA: Diagnosis not present

## 2014-08-07 DIAGNOSIS — R279 Unspecified lack of coordination: Secondary | ICD-10-CM | POA: Diagnosis not present

## 2014-08-07 DIAGNOSIS — R262 Difficulty in walking, not elsewhere classified: Secondary | ICD-10-CM | POA: Diagnosis not present

## 2014-08-07 DIAGNOSIS — M6281 Muscle weakness (generalized): Secondary | ICD-10-CM | POA: Diagnosis not present

## 2014-08-10 DIAGNOSIS — R296 Repeated falls: Secondary | ICD-10-CM | POA: Diagnosis not present

## 2014-08-10 DIAGNOSIS — M6281 Muscle weakness (generalized): Secondary | ICD-10-CM | POA: Diagnosis not present

## 2014-08-10 DIAGNOSIS — R279 Unspecified lack of coordination: Secondary | ICD-10-CM | POA: Diagnosis not present

## 2014-08-10 DIAGNOSIS — R262 Difficulty in walking, not elsewhere classified: Secondary | ICD-10-CM | POA: Diagnosis not present

## 2014-08-11 DIAGNOSIS — R296 Repeated falls: Secondary | ICD-10-CM | POA: Diagnosis not present

## 2014-08-11 DIAGNOSIS — M6281 Muscle weakness (generalized): Secondary | ICD-10-CM | POA: Diagnosis not present

## 2014-08-11 DIAGNOSIS — R262 Difficulty in walking, not elsewhere classified: Secondary | ICD-10-CM | POA: Diagnosis not present

## 2014-08-11 DIAGNOSIS — R279 Unspecified lack of coordination: Secondary | ICD-10-CM | POA: Diagnosis not present

## 2014-08-12 DIAGNOSIS — R279 Unspecified lack of coordination: Secondary | ICD-10-CM | POA: Diagnosis not present

## 2014-08-12 DIAGNOSIS — M6281 Muscle weakness (generalized): Secondary | ICD-10-CM | POA: Diagnosis not present

## 2014-08-12 DIAGNOSIS — R296 Repeated falls: Secondary | ICD-10-CM | POA: Diagnosis not present

## 2014-08-12 DIAGNOSIS — R262 Difficulty in walking, not elsewhere classified: Secondary | ICD-10-CM | POA: Diagnosis not present

## 2014-08-13 DIAGNOSIS — R279 Unspecified lack of coordination: Secondary | ICD-10-CM | POA: Diagnosis not present

## 2014-08-13 DIAGNOSIS — R296 Repeated falls: Secondary | ICD-10-CM | POA: Diagnosis not present

## 2014-08-13 DIAGNOSIS — R262 Difficulty in walking, not elsewhere classified: Secondary | ICD-10-CM | POA: Diagnosis not present

## 2014-08-13 DIAGNOSIS — M6281 Muscle weakness (generalized): Secondary | ICD-10-CM | POA: Diagnosis not present

## 2014-08-14 DIAGNOSIS — M6281 Muscle weakness (generalized): Secondary | ICD-10-CM | POA: Diagnosis not present

## 2014-08-14 DIAGNOSIS — R279 Unspecified lack of coordination: Secondary | ICD-10-CM | POA: Diagnosis not present

## 2014-08-14 DIAGNOSIS — R296 Repeated falls: Secondary | ICD-10-CM | POA: Diagnosis not present

## 2014-08-14 DIAGNOSIS — R262 Difficulty in walking, not elsewhere classified: Secondary | ICD-10-CM | POA: Diagnosis not present

## 2014-08-17 DIAGNOSIS — R279 Unspecified lack of coordination: Secondary | ICD-10-CM | POA: Diagnosis not present

## 2014-08-17 DIAGNOSIS — R262 Difficulty in walking, not elsewhere classified: Secondary | ICD-10-CM | POA: Diagnosis not present

## 2014-08-17 DIAGNOSIS — M6281 Muscle weakness (generalized): Secondary | ICD-10-CM | POA: Diagnosis not present

## 2014-08-17 DIAGNOSIS — R296 Repeated falls: Secondary | ICD-10-CM | POA: Diagnosis not present

## 2014-08-18 DIAGNOSIS — R296 Repeated falls: Secondary | ICD-10-CM | POA: Diagnosis not present

## 2014-08-18 DIAGNOSIS — R262 Difficulty in walking, not elsewhere classified: Secondary | ICD-10-CM | POA: Diagnosis not present

## 2014-08-18 DIAGNOSIS — M6281 Muscle weakness (generalized): Secondary | ICD-10-CM | POA: Diagnosis not present

## 2014-08-18 DIAGNOSIS — R279 Unspecified lack of coordination: Secondary | ICD-10-CM | POA: Diagnosis not present

## 2014-08-19 DIAGNOSIS — R262 Difficulty in walking, not elsewhere classified: Secondary | ICD-10-CM | POA: Diagnosis not present

## 2014-08-19 DIAGNOSIS — M6281 Muscle weakness (generalized): Secondary | ICD-10-CM | POA: Diagnosis not present

## 2014-08-19 DIAGNOSIS — R279 Unspecified lack of coordination: Secondary | ICD-10-CM | POA: Diagnosis not present

## 2014-08-19 DIAGNOSIS — R296 Repeated falls: Secondary | ICD-10-CM | POA: Diagnosis not present

## 2014-08-20 DIAGNOSIS — R296 Repeated falls: Secondary | ICD-10-CM | POA: Diagnosis not present

## 2014-08-20 DIAGNOSIS — R279 Unspecified lack of coordination: Secondary | ICD-10-CM | POA: Diagnosis not present

## 2014-08-20 DIAGNOSIS — R262 Difficulty in walking, not elsewhere classified: Secondary | ICD-10-CM | POA: Diagnosis not present

## 2014-08-20 DIAGNOSIS — M6281 Muscle weakness (generalized): Secondary | ICD-10-CM | POA: Diagnosis not present

## 2014-08-21 DIAGNOSIS — I4891 Unspecified atrial fibrillation: Secondary | ICD-10-CM | POA: Diagnosis not present

## 2014-08-21 DIAGNOSIS — Z7901 Long term (current) use of anticoagulants: Secondary | ICD-10-CM | POA: Diagnosis not present

## 2014-08-21 DIAGNOSIS — I10 Essential (primary) hypertension: Secondary | ICD-10-CM | POA: Diagnosis not present

## 2014-08-21 DIAGNOSIS — M79673 Pain in unspecified foot: Secondary | ICD-10-CM | POA: Diagnosis not present

## 2014-08-25 DIAGNOSIS — R262 Difficulty in walking, not elsewhere classified: Secondary | ICD-10-CM | POA: Diagnosis not present

## 2014-08-25 DIAGNOSIS — M6281 Muscle weakness (generalized): Secondary | ICD-10-CM | POA: Diagnosis not present

## 2014-08-25 DIAGNOSIS — R279 Unspecified lack of coordination: Secondary | ICD-10-CM | POA: Diagnosis not present

## 2014-08-25 DIAGNOSIS — R296 Repeated falls: Secondary | ICD-10-CM | POA: Diagnosis not present

## 2014-08-26 DIAGNOSIS — R279 Unspecified lack of coordination: Secondary | ICD-10-CM | POA: Diagnosis not present

## 2014-08-26 DIAGNOSIS — R262 Difficulty in walking, not elsewhere classified: Secondary | ICD-10-CM | POA: Diagnosis not present

## 2014-08-26 DIAGNOSIS — M6281 Muscle weakness (generalized): Secondary | ICD-10-CM | POA: Diagnosis not present

## 2014-08-26 DIAGNOSIS — R296 Repeated falls: Secondary | ICD-10-CM | POA: Diagnosis not present

## 2014-08-28 DIAGNOSIS — R262 Difficulty in walking, not elsewhere classified: Secondary | ICD-10-CM | POA: Diagnosis not present

## 2014-08-28 DIAGNOSIS — M6281 Muscle weakness (generalized): Secondary | ICD-10-CM | POA: Diagnosis not present

## 2014-08-28 DIAGNOSIS — R296 Repeated falls: Secondary | ICD-10-CM | POA: Diagnosis not present

## 2014-08-28 DIAGNOSIS — R279 Unspecified lack of coordination: Secondary | ICD-10-CM | POA: Diagnosis not present

## 2014-08-31 DIAGNOSIS — R296 Repeated falls: Secondary | ICD-10-CM | POA: Diagnosis not present

## 2014-08-31 DIAGNOSIS — R262 Difficulty in walking, not elsewhere classified: Secondary | ICD-10-CM | POA: Diagnosis not present

## 2014-08-31 DIAGNOSIS — R279 Unspecified lack of coordination: Secondary | ICD-10-CM | POA: Diagnosis not present

## 2014-08-31 DIAGNOSIS — M6281 Muscle weakness (generalized): Secondary | ICD-10-CM | POA: Diagnosis not present

## 2014-09-02 DIAGNOSIS — R262 Difficulty in walking, not elsewhere classified: Secondary | ICD-10-CM | POA: Diagnosis not present

## 2014-09-02 DIAGNOSIS — R296 Repeated falls: Secondary | ICD-10-CM | POA: Diagnosis not present

## 2014-09-02 DIAGNOSIS — M6281 Muscle weakness (generalized): Secondary | ICD-10-CM | POA: Diagnosis not present

## 2014-09-02 DIAGNOSIS — R279 Unspecified lack of coordination: Secondary | ICD-10-CM | POA: Diagnosis not present

## 2014-09-03 ENCOUNTER — Inpatient Hospital Stay (HOSPITAL_COMMUNITY)
Admission: EM | Admit: 2014-09-03 | Discharge: 2014-09-08 | DRG: 070 | Disposition: A | Discharge status: Skilled Nursing Facility | Payer: Medicare Other | Attending: Family Medicine | Admitting: Family Medicine

## 2014-09-03 ENCOUNTER — Encounter (HOSPITAL_COMMUNITY): Payer: Self-pay

## 2014-09-03 ENCOUNTER — Emergency Department (HOSPITAL_COMMUNITY): Payer: Medicare Other

## 2014-09-03 DIAGNOSIS — R1110 Vomiting, unspecified: Secondary | ICD-10-CM | POA: Diagnosis present

## 2014-09-03 DIAGNOSIS — I493 Ventricular premature depolarization: Secondary | ICD-10-CM | POA: Insufficient documentation

## 2014-09-03 DIAGNOSIS — I25810 Atherosclerosis of coronary artery bypass graft(s) without angina pectoris: Secondary | ICD-10-CM | POA: Diagnosis not present

## 2014-09-03 DIAGNOSIS — R3989 Other symptoms and signs involving the genitourinary system: Secondary | ICD-10-CM

## 2014-09-03 DIAGNOSIS — Z87891 Personal history of nicotine dependence: Secondary | ICD-10-CM

## 2014-09-03 DIAGNOSIS — R1112 Projectile vomiting: Secondary | ICD-10-CM | POA: Diagnosis not present

## 2014-09-03 DIAGNOSIS — I129 Hypertensive chronic kidney disease with stage 1 through stage 4 chronic kidney disease, or unspecified chronic kidney disease: Secondary | ICD-10-CM | POA: Diagnosis present

## 2014-09-03 DIAGNOSIS — R1310 Dysphagia, unspecified: Secondary | ICD-10-CM

## 2014-09-03 DIAGNOSIS — N183 Chronic kidney disease, stage 3 unspecified: Secondary | ICD-10-CM | POA: Diagnosis present

## 2014-09-03 DIAGNOSIS — E43 Unspecified severe protein-calorie malnutrition: Secondary | ICD-10-CM | POA: Insufficient documentation

## 2014-09-03 DIAGNOSIS — M545 Low back pain, unspecified: Secondary | ICD-10-CM | POA: Diagnosis present

## 2014-09-03 DIAGNOSIS — F0390 Unspecified dementia without behavioral disturbance: Secondary | ICD-10-CM | POA: Diagnosis present

## 2014-09-03 DIAGNOSIS — I481 Persistent atrial fibrillation: Secondary | ICD-10-CM | POA: Diagnosis present

## 2014-09-03 DIAGNOSIS — I10 Essential (primary) hypertension: Secondary | ICD-10-CM | POA: Diagnosis present

## 2014-09-03 DIAGNOSIS — I482 Chronic atrial fibrillation: Secondary | ICD-10-CM | POA: Diagnosis not present

## 2014-09-03 DIAGNOSIS — R39198 Other difficulties with micturition: Secondary | ICD-10-CM | POA: Diagnosis present

## 2014-09-03 DIAGNOSIS — G9340 Encephalopathy, unspecified: Principal | ICD-10-CM | POA: Diagnosis present

## 2014-09-03 DIAGNOSIS — Z7901 Long term (current) use of anticoagulants: Secondary | ICD-10-CM | POA: Diagnosis not present

## 2014-09-03 DIAGNOSIS — M79609 Pain in unspecified limb: Secondary | ICD-10-CM | POA: Diagnosis not present

## 2014-09-03 DIAGNOSIS — Z9079 Acquired absence of other genital organ(s): Secondary | ICD-10-CM | POA: Diagnosis present

## 2014-09-03 DIAGNOSIS — I2510 Atherosclerotic heart disease of native coronary artery without angina pectoris: Secondary | ICD-10-CM | POA: Diagnosis present

## 2014-09-03 DIAGNOSIS — N179 Acute kidney failure, unspecified: Secondary | ICD-10-CM | POA: Diagnosis present

## 2014-09-03 DIAGNOSIS — R300 Dysuria: Secondary | ICD-10-CM | POA: Diagnosis present

## 2014-09-03 DIAGNOSIS — Z8673 Personal history of transient ischemic attack (TIA), and cerebral infarction without residual deficits: Secondary | ICD-10-CM

## 2014-09-03 DIAGNOSIS — Z951 Presence of aortocoronary bypass graft: Secondary | ICD-10-CM | POA: Diagnosis not present

## 2014-09-03 DIAGNOSIS — Z96642 Presence of left artificial hip joint: Secondary | ICD-10-CM | POA: Diagnosis present

## 2014-09-03 DIAGNOSIS — D649 Anemia, unspecified: Secondary | ICD-10-CM | POA: Diagnosis not present

## 2014-09-03 DIAGNOSIS — N289 Disorder of kidney and ureter, unspecified: Secondary | ICD-10-CM | POA: Diagnosis not present

## 2014-09-03 DIAGNOSIS — R410 Disorientation, unspecified: Secondary | ICD-10-CM | POA: Diagnosis not present

## 2014-09-03 DIAGNOSIS — Z8546 Personal history of malignant neoplasm of prostate: Secondary | ICD-10-CM | POA: Diagnosis not present

## 2014-09-03 DIAGNOSIS — R4182 Altered mental status, unspecified: Secondary | ICD-10-CM | POA: Diagnosis not present

## 2014-09-03 DIAGNOSIS — E860 Dehydration: Secondary | ICD-10-CM | POA: Diagnosis present

## 2014-09-03 DIAGNOSIS — K219 Gastro-esophageal reflux disease without esophagitis: Secondary | ICD-10-CM | POA: Diagnosis present

## 2014-09-03 DIAGNOSIS — I4891 Unspecified atrial fibrillation: Secondary | ICD-10-CM | POA: Diagnosis present

## 2014-09-03 DIAGNOSIS — Z6826 Body mass index (BMI) 26.0-26.9, adult: Secondary | ICD-10-CM | POA: Diagnosis not present

## 2014-09-03 DIAGNOSIS — Z515 Encounter for palliative care: Secondary | ICD-10-CM

## 2014-09-03 DIAGNOSIS — I472 Ventricular tachycardia: Secondary | ICD-10-CM | POA: Diagnosis present

## 2014-09-03 DIAGNOSIS — N2889 Other specified disorders of kidney and ureter: Secondary | ICD-10-CM | POA: Diagnosis present

## 2014-09-03 DIAGNOSIS — Z9849 Cataract extraction status, unspecified eye: Secondary | ICD-10-CM

## 2014-09-03 DIAGNOSIS — T17900A Unspecified foreign body in respiratory tract, part unspecified causing asphyxiation, initial encounter: Secondary | ICD-10-CM | POA: Diagnosis not present

## 2014-09-03 LAB — URINALYSIS, ROUTINE W REFLEX MICROSCOPIC
BILIRUBIN URINE: NEGATIVE
GLUCOSE, UA: NEGATIVE mg/dL
Hgb urine dipstick: NEGATIVE
KETONES UR: NEGATIVE mg/dL
LEUKOCYTES UA: NEGATIVE
NITRITE: NEGATIVE
PH: 6.5 (ref 5.0–8.0)
Protein, ur: 30 mg/dL — AB
SPECIFIC GRAVITY, URINE: 1.016 (ref 1.005–1.030)
Urobilinogen, UA: 0.2 mg/dL (ref 0.0–1.0)

## 2014-09-03 LAB — CBC
HCT: 34 % — ABNORMAL LOW (ref 39.0–52.0)
HEMOGLOBIN: 10.8 g/dL — AB (ref 13.0–17.0)
MCH: 30.1 pg (ref 26.0–34.0)
MCHC: 31.8 g/dL (ref 30.0–36.0)
MCV: 94.7 fL (ref 78.0–100.0)
Platelets: 219 10*3/uL (ref 150–400)
RBC: 3.59 MIL/uL — ABNORMAL LOW (ref 4.22–5.81)
RDW: 14.4 % (ref 11.5–15.5)
WBC: 7.5 10*3/uL (ref 4.0–10.5)

## 2014-09-03 LAB — BASIC METABOLIC PANEL
ANION GAP: 9 (ref 5–15)
BUN: 27 mg/dL — ABNORMAL HIGH (ref 6–23)
CO2: 25 mmol/L (ref 19–32)
CREATININE: 1.64 mg/dL — AB (ref 0.50–1.35)
Calcium: 9.4 mg/dL (ref 8.4–10.5)
Chloride: 103 mmol/L (ref 96–112)
GFR calc Af Amer: 40 mL/min — ABNORMAL LOW (ref >90)
GFR calc non Af Amer: 34 mL/min — ABNORMAL LOW (ref >90)
GLUCOSE: 126 mg/dL — AB (ref 70–99)
Potassium: 4.6 mmol/L (ref 3.5–5.1)
SODIUM: 137 mmol/L (ref 135–145)

## 2014-09-03 LAB — I-STAT TROPONIN, ED: TROPONIN I, POC: 0.03 ng/mL (ref 0.00–0.08)

## 2014-09-03 LAB — PROTIME-INR
INR: 2.03 — ABNORMAL HIGH (ref 0.00–1.49)
Prothrombin Time: 23.2 seconds — ABNORMAL HIGH (ref 11.6–15.2)

## 2014-09-03 LAB — URINE MICROSCOPIC-ADD ON

## 2014-09-03 MED ORDER — SODIUM CHLORIDE 0.9 % IV BOLUS (SEPSIS)
2000.0000 mL | Freq: Once | INTRAVENOUS | Status: AC
  Administered 2014-09-03: 2000 mL via INTRAVENOUS

## 2014-09-03 MED ORDER — LABETALOL HCL 5 MG/ML IV SOLN
20.0000 mg | Freq: Once | INTRAVENOUS | Status: AC
  Administered 2014-09-03: 20 mg via INTRAVENOUS
  Filled 2014-09-03: qty 4

## 2014-09-03 MED ORDER — THIAMINE HCL 100 MG/ML IJ SOLN
100.0000 mg | Freq: Once | INTRAMUSCULAR | Status: AC
  Administered 2014-09-03: 100 mg via INTRAVENOUS
  Filled 2014-09-03: qty 2

## 2014-09-03 NOTE — ED Notes (Signed)
Per Dr. Winfred Leeds, pt able to eat something. Pt given [place] sandwich. Informed RN, also.

## 2014-09-03 NOTE — ED Notes (Signed)
Notified Dr. Kandis Mannan of pt's elevated asystolic pressure. No new orders at this time, plan to continue to monitor and likely admit.

## 2014-09-03 NOTE — H&P (Signed)
[facility] History and Physical  Helmuth Recupero NWG:[id number] DOB: 09-21-1918 DOA: 09/03/2014  Referring physician: ED physician PCP:  Melinda Crutch, MD  Specialists:   Chief Complaint: AMS, vomiting and difficult urinating  HPI: Kurt Thomas is a 81 y.o. male with past medical history for coronary artery disease, history of stroke, chronic kidney disease -III, history of prostate cancer (post status of surgery, prostatectomy 20 years ago), atrial fibrillation on Coumadin, who presents with AMS, vomiting and difficult urinating.  Per patient's son-in-law, patient has difficulty urinating and dysuria in the last week. He does not have fever or chills. He seems to be more confused than his baseline. Patient also has vomiting without nausea, diarrhea or abdominal pain. his vomiting always happens in the morning. Not very sure whether he has difficulty swallowing. He lost some body weight, not sure much weight he lost exactly.  Patient denies fever, chills, cough, chest pain, SOB, abdominal pain, diarrhea, skin rashes or leg swelling. No unilateral weakness, numbness or tingling sensations. No vision change or hearing loss.  In ED, patient was found to have Elevated blood pressure at 212/128, negative urinalysis, negative troponin, stable renal function, negative CT handful acute abnormalities, but he has old infarction over right MCA territory. CT showed A. Fib. Patient is admitted to inpatient for further evaluation and treatment.  Review of Systems: As presented in the history of presenting illness, rest negative.  Where does patient live?  Assisted-living facility Can patient participate in ADLs? none  Allergy: No Known Allergies  Past Medical History  Diagnosis Date  . High blood pressure   . Memory loss   . Low back pain   . Stroke   . Atrial fibrillation   . Prostate cancer   . CAD (coronary artery disease)     Past Surgical History  Procedure Laterality Date  . Coronary artery  bypass graft    . Prostatectomy    . Cataract extraction    . Hip arthroplasty Left 06/22/2014    Procedure: LEFT HEMIARTHROPLASTY HIP;  Surgeon: Meredith Pel, MD;  Location: [facility];  Service: Orthopedics;  Laterality: Left;    Social History:  reports that he quit smoking about 58 years ago. His smoking use included Cigarettes. He has a 1 pack-year smoking history. He has never used smokeless tobacco. He reports that he does not drink alcohol or use illicit drugs.  Family History:  Family History  Problem Relation Age of Onset  . High blood pressure    . Cancer Father      Prior to Admission medications   Medication Sig Start Date End Date Taking? Authorizing Provider  albuterol (PROVENTIL) (2.5 MG/3ML) 0.083% nebulizer solution Take 2.5 mg by nebulization every 6 (six) hours as needed for wheezing or shortness of breath.    Historical Provider, MD  Camphor-Eucalyptus-Menthol (VICKS VAPORUB) 4.7-1.2-2.6 % OINT Apply 1 application topically at bedtime as needed (for congestion).    Historical Provider, MD  carvedilol (COREG) 12.5 MG tablet Take 12.5 mg by mouth 2 (two) times daily with a meal.    Historical Provider, MD  docusate sodium (COLACE) 100 MG capsule Take 100 mg by mouth at bedtime.    Historical Provider, MD  donepezil (ARICEPT) 10 MG tablet Take 10 mg by mouth at bedtime.    Historical Provider, MD  DULoxetine (CYMBALTA) 20 MG capsule Take 1 capsule (20 mg total) by mouth daily. 05/07/14   Marcial Pacas, MD  Ferrous Fumarate 150 MG TABS Take 1 tablet (150  mg total) by mouth 2 (two) times daily. 06/24/14   Barton Dubois, MD  HYDROcodone-acetaminophen (NORCO/VICODIN) 5-325 MG per tablet Take 1-2 tablets by mouth every 6 (six) hours as needed for severe pain. 06/24/14   Barton Dubois, MD  isosorbide-hydrALAZINE (BIDIL) 20-37.5 MG per tablet Take 1 tablet by mouth 2 (two) times daily. 06/24/14   Barton Dubois, MD  Menthol, Topical Analgesic, (ICY HOT EX) Apply 1 application  topically every 8 (eight) hours as needed (for pain).    Historical Provider, MD  promethazine-codeine (PHENERGAN WITH CODEINE) 6.25-10 MG/5ML syrup Take 5 mLs by mouth 4 (four) times daily as needed for cough.    Historical Provider, MD  ranitidine (ZANTAC) 150 MG capsule Take 150 mg by mouth daily with breakfast.     Historical Provider, MD  sodium chloride (OCEAN) 0.65 % SOLN nasal spray Place 2 sprays into both nostrils 4 (four) times daily as needed (for dry nose).     Historical Provider, MD  traMADol (ULTRAM) 50 MG tablet Take 1 tablet (50 mg total) by mouth every 6 (six) hours as needed for moderate pain (pain). 06/24/14   Barton Dubois, MD  warfarin (COUMADIN) 5 MG tablet Take 1.5 tablets (7.5 mg total) by mouth daily. To be taken for the next 2 days; then will require INR level and further coumadin adjustments. 06/24/14   Barton Dubois, MD    Physical Exam: Filed Vitals:   09/04/14 0200 09/04/14 0230 09/04/14 0300 09/04/14 0330  BP: 138/65 134/79 123/66 144/72  Pulse: 73 67 39 60  Temp:      TempSrc:      Resp: 16 18 20 20   Weight:      SpO2:  93% 94% 96%   General: Not in acute distress, dry mucous in the membrane HEENT:       Eyes: PERRL, EOMI, no scleral icterus       ENT: No discharge from the ears and nose, no pharynx injection, no tonsillar enlargement.        Neck: No JVD, no bruit, no mass felt. Cardiac: S1/S2, RRR, No murmurs, No gallops or rubs Pulm: Good air movement bilaterally. Clear to auscultation bilaterally. No rales, wheezing, rhonchi or rubs. Abd: Soft, nondistended, nontender, no rebound pain, no organomegaly, BS present Ext: No edema bilaterally. 2+DP/PT pulse bilaterally Musculoskeletal: No joint deformities, erythema, or stiffness, ROM full Skin: No rashes.  Neuro: Alert and oriented to person, but not to place and time, cranial nerves II-XII grossly intact, muscle strength 5/5 in all extremeties, sensation to light touch intact. Brachial reflex 2+  bilaterally. Knee reflex 1+ bilaterally. Negative Babinski's sign. Normal finger to nose test. Psych: Patient is not psychotic, no suicidal or hemocidal ideation.  Labs on Admission:  Basic Metabolic Panel:  Recent Labs Lab 09/03/14 1827 09/04/14 0342  NA 137 137  K 4.6 4.1  CL 103 108  CO2 25 23  GLUCOSE 126* 116*  BUN 27* 24*  CREATININE 1.64* 1.39*  CALCIUM 9.4 8.5   Liver Function Tests:  Recent Labs Lab 09/04/14 0342  AST 14  ALT 14  ALKPHOS 90  BILITOT 0.5  PROT 5.8*  ALBUMIN 3.1*   No results for input(s): LIPASE, AMYLASE in the last 168 hours. No results for input(s): AMMONIA in the last 168 hours. CBC:  Recent Labs Lab 09/03/14 1827 09/04/14 0342  WBC 7.5 7.0  HGB 10.8* 9.7*  HCT 34.0* 30.9*  MCV 94.7 94.8  PLT 219 169   Cardiac Enzymes: No results  for input(s): CKTOTAL, CKMB, CKMBINDEX, TROPONINI in the last 168 hours.  BNP (last 3 results) No results for input(s): BNP in the last 8760 hours.  ProBNP (last 3 results) No results for input(s): PROBNP in the last 8760 hours.  CBG: No results for input(s): GLUCAP in the last 168 hours.  Radiological Exams on Admission: Ct Head Wo Contrast  09/03/2014   CLINICAL DATA:  81 year old male with confusion.  EXAM: CT HEAD WITHOUT CONTRAST  TECHNIQUE: Contiguous axial images were obtained from the base of the skull through the vertex without intravenous contrast.  COMPARISON:  None.  FINDINGS: Generalized atrophy and chronic small vessel ischemia. Encephalomalacia right MCA distribution. Remote prior infarcts in right cerebellum. Small lacunar infarcts in the right brainstem and left basal ganglia. No intracranial hemorrhage, mass effect, or midline shift. No hydrocephalus. The basilar cisterns are patent. No extra-axial fluid collection. Calvarium is intact. Mucous retention cyst in the right maxillary sinus. Minimal mucosal thickening of the right frontal sinus and ethmoid air cells. The mastoid air cells  are well aerated.  IMPRESSION: 1. No evidence of acute intracranial abnormality. 2. Multifocal prior infarcts in the right MCA distribution, right cerebellum, right brainstem on left basal ganglia. Atrophy and chronic small vessel ischemic change.   Electronically Signed   By: Jeb Levering M.D.   On: 09/03/2014 22:21    EKG: Independently reviewed. Atrial fibrillation  Assessment/Plan Principal Problem:   Acute encephalopathy Active Problems:   Low back pain   Atrial fibrillation   Essential hypertension   CKD (chronic kidney disease) stage 3, GFR 30-59 ml/min   CAD (coronary artery disease)   Vomiting   Difficulty urinating   Acute encephalopathy: Knowledge is not clear. CT head is negative for acute abnormalities. Differential diagnoses include dehydration, delirium, hypertensive encephalopathy. Patient does not have signs of stroke. No chest pain. Patient is clinically dehydrated on admission Per patient's son-in-law, his mental status improved significantly after treated with IV labetalol and IV fluid in emergency room. -will admit to SUD -neuro check q4h -IVF: NS 100 cc/h -control Bp as below  HTN: patient's blood pressure is significantly elevated at 212/128. Patient was treated with 1 dose of IV labetalol 20 mg, blood pressure dropped to 145/60. His mental status improved significantly. -continue Coreg 12.5 mg twice a day -start amlodipine 10 mg daily  Vomiting: It happens only in the morning, no nausea, abdominal pain or diarrhea. etiology is not clear. -get SLP  Difficult urinating: Patient has difficulty urinating and dysuria. Urinalysis is negative. Etiology is not clear. It is possible that the patient has urethra stricture secondary to previous prostatectomy. Currently renal function is stable -May follow-up with urology after discharge. -follow up Ux -check US-renal  Atrial fibrillation: CHA2DS2-VASc Score is 6, need oral anticoagulation. Patient is on coumadin  at home. INR is 2.03 on admission -continue Coumadin per pharmacy  Coronary artery disease: No chest pain -Continue home medications: Coreg, Bidil  Gerd: -Continue Pepcid  DVT ppx: SCD  Code Status: Full code Family Communication:  Yes, patient's son-in law      at bed side Disposition Plan: Admit to inpatient   Date of Service 09/04/2014    Ivor Costa [facility] Pager [phone]  If 7PM-7AM, please contact night-coverage [email or web address] Password [facility] 09/04/2014, 5:13 AM

## 2014-09-03 NOTE — ED Provider Notes (Signed)
CSN: [id number]     Arrival date & time 09/03/14  1801 History   None    Chief Complaint  Patient presents with  . Dehydration     (Consider location/radiation/quality/duration/timing/severity/associated sxs/prior Treatment) HPI Patient with diminished appetite, increased confusion and vomiting for presently the past month. Complained of dysuria onset approximately 3 days ago was seen by Dr. Harrington Challenger earlier today could not produce urine specimen. Sent here for further evaluation, as it was felt that he may have urinary tract infection. Patient's son in law who have accompanies him states that he's been vomiting daily each morning for the past month. No other associated symptoms Past Medical History  Diagnosis Date  . High blood pressure   . Memory loss   . Low back pain   . Stroke   . Atrial fibrillation   . Prostate cancer   . CAD (coronary artery disease)    Past Surgical History  Procedure Laterality Date  . Coronary artery bypass graft    . Prostatectomy    . Cataract extraction    . Hip arthroplasty Left 06/22/2014    Procedure: LEFT HEMIARTHROPLASTY HIP;  Surgeon: Meredith Pel, MD;  Location: [facility];  Service: Orthopedics;  Laterality: Left;   Family History  Problem Relation Age of Onset  . High blood pressure    . Cancer Father    History  Substance Use Topics  . Smoking status: Former Smoker -- 1.00 packs/day for 1 years    Types: Cigarettes    Quit date: 07/10/1956  . Smokeless tobacco: Never Used  . Alcohol Use: No    Review of Systems  Gastrointestinal: Positive for vomiting.  Genitourinary: Positive for dysuria.  Musculoskeletal: Positive for gait problem.  Neurological:       Altered mental status  All other systems reviewed and are negative.     Allergies  Review of patient's allergies indicates no known allergies.  Home Medications   Prior to Admission medications   Medication Sig Start Date End Date Taking? Authorizing Provider   albuterol (PROVENTIL) (2.5 MG/3ML) 0.083% nebulizer solution Take 2.5 mg by nebulization every 6 (six) hours as needed for wheezing or shortness of breath.    Historical Provider, MD  Camphor-Eucalyptus-Menthol (VICKS VAPORUB) 4.7-1.2-2.6 % OINT Apply 1 application topically at bedtime as needed (for congestion).    Historical Provider, MD  carvedilol (COREG) 12.5 MG tablet Take 12.5 mg by mouth 2 (two) times daily with a meal.    Historical Provider, MD  docusate sodium (COLACE) 100 MG capsule Take 100 mg by mouth at bedtime.    Historical Provider, MD  donepezil (ARICEPT) 10 MG tablet Take 10 mg by mouth at bedtime.    Historical Provider, MD  DULoxetine (CYMBALTA) 20 MG capsule Take 1 capsule (20 mg total) by mouth daily. 05/07/14   Marcial Pacas, MD  Ferrous Fumarate 150 MG TABS Take 1 tablet (150 mg total) by mouth 2 (two) times daily. 06/24/14   Barton Dubois, MD  HYDROcodone-acetaminophen (NORCO/VICODIN) 5-325 MG per tablet Take 1-2 tablets by mouth every 6 (six) hours as needed for severe pain. 06/24/14   Barton Dubois, MD  isosorbide-hydrALAZINE (BIDIL) 20-37.5 MG per tablet Take 1 tablet by mouth 2 (two) times daily. 06/24/14   Barton Dubois, MD  Menthol, Topical Analgesic, (ICY HOT EX) Apply 1 application topically every 8 (eight) hours as needed (for pain).    Historical Provider, MD  promethazine-codeine (PHENERGAN WITH CODEINE) 6.25-10 MG/5ML syrup Take 5 mLs by mouth  4 (four) times daily as needed for cough.    Historical Provider, MD  ranitidine (ZANTAC) 150 MG capsule Take 150 mg by mouth daily with breakfast.     Historical Provider, MD  sodium chloride (OCEAN) 0.65 % SOLN nasal spray Place 2 sprays into both nostrils 4 (four) times daily as needed (for dry nose).     Historical Provider, MD  traMADol (ULTRAM) 50 MG tablet Take 1 tablet (50 mg total) by mouth every 6 (six) hours as needed for moderate pain (pain). 06/24/14   Barton Dubois, MD  warfarin (COUMADIN) 5 MG tablet Take 1.5  tablets (7.5 mg total) by mouth daily. To be taken for the next 2 days; then will require INR level and further coumadin adjustments. 06/24/14   Barton Dubois, MD   BP 190/119 mmHg  Pulse 88  Temp(Src) 97.7 F (36.5 C) (Oral)  Resp 16  SpO2 97% Physical Exam  Constitutional: He appears well-nourished. He appears distressed.  Chronically ill-appearing  HENT:  Head: Normocephalic and atraumatic.  No facial asymmetry poor dentition mucous membranes dry  Eyes: Conjunctivae are normal. Pupils are equal, round, and reactive to light.  Neck: Neck supple. No tracheal deviation present. No thyromegaly present.  Cardiovascular: Normal rate and regular rhythm.   No murmur heard. Pulmonary/Chest: Effort normal and breath sounds normal.  Abdominal: Soft. Bowel sounds are normal. He exhibits no distension. There is no tenderness.  Musculoskeletal: Normal range of motion. He exhibits no edema or tenderness.  Neurological: He is alert. No cranial nerve deficit. Coordination normal.  Moves all extremities follow simple commands  Skin: Skin is warm and dry. No rash noted.  Psychiatric: He has a normal mood and affect.  Nursing note and vitals reviewed.   ED Course  Procedures (including critical care time) Labs Review Labs Reviewed  CBC - Abnormal; Notable for the following:    RBC 3.59 (*)    Hemoglobin 10.8 (*)    HCT 34.0 (*)    All other components within normal limits  BASIC METABOLIC PANEL - Abnormal; Notable for the following:    Glucose, Bld 126 (*)    BUN 27 (*)    Creatinine, Ser 1.64 (*)    GFR calc non Af Amer 34 (*)    GFR calc Af Amer 40 (*)    All other components within normal limits  PROTIME-INR - Abnormal; Notable for the following:    Prothrombin Time 23.2 (*)    INR 2.03 (*)    All other components within normal limits  URINALYSIS, ROUTINE W REFLEX MICROSCOPIC    Imaging Review No results found.   EKG Interpretation   Date/Time:  Thursday September 03 2014  20:43:16 EST Ventricular Rate:  89 PR Interval:    QRS Duration: 94 QT Interval:  382 QTC Calculation: 465 R Axis:   9 Text Interpretation:  Atrial fibrillation Ventricular premature complex  Borderline low voltage, extremity leads Baseline wander in lead(s) V4  Premature ventricular complexes New since previous tracing Confirmed by  Winfred Leeds  MD, Shamar Engelmann [id number]) on 09/03/2014 8:59:39 PM      Administered labetalol, ordered by me 2 2:45 PM his blood pressure noted to be 212/128 at 20 2:16 PM Results for orders placed or performed during the hospital encounter of 09/03/14  CBC  Result Value Ref Range   WBC 7.5 4.0 - 10.5 K/uL   RBC 3.59 (L) 4.22 - 5.81 MIL/uL   Hemoglobin 10.8 (L) 13.0 - 17.0 g/dL   HCT 34.0 (  L) 39.0 - 52.0 %   MCV 94.7 78.0 - 100.0 fL   MCH 30.1 26.0 - 34.0 pg   MCHC 31.8 30.0 - 36.0 g/dL   RDW 14.4 11.5 - 15.5 %   Platelets 219 150 - 400 K/uL  Basic metabolic panel  Result Value Ref Range   Sodium 137 135 - 145 mmol/L   Potassium 4.6 3.5 - 5.1 mmol/L   Chloride 103 96 - 112 mmol/L   CO2 25 19 - 32 mmol/L   Glucose, Bld 126 (H) 70 - 99 mg/dL   BUN 27 (H) 6 - 23 mg/dL   Creatinine, Ser 1.64 (H) 0.50 - 1.35 mg/dL   Calcium 9.4 8.4 - 10.5 mg/dL   GFR calc non Af Amer 34 (L) >90 mL/min   GFR calc Af Amer 40 (L) >90 mL/min   Anion gap 9 5 - 15  Urinalysis, Routine w reflex microscopic  Result Value Ref Range   Color, Urine YELLOW YELLOW   APPearance CLEAR CLEAR   Specific Gravity, Urine 1.016 1.005 - 1.030   pH 6.5 5.0 - 8.0   Glucose, UA NEGATIVE NEGATIVE mg/dL   Hgb urine dipstick NEGATIVE NEGATIVE   Bilirubin Urine NEGATIVE NEGATIVE   Ketones, ur NEGATIVE NEGATIVE mg/dL   Protein, ur 30 (A) NEGATIVE mg/dL   Urobilinogen, UA 0.2 0.0 - 1.0 mg/dL   Nitrite NEGATIVE NEGATIVE   Leukocytes, UA NEGATIVE NEGATIVE  Protime-INR  Result Value Ref Range   Prothrombin Time 23.2 (H) 11.6 - 15.2 seconds   INR 2.03 (H) 0.00 - 1.49  Urine microscopic-add on   Result Value Ref Range   Squamous Epithelial / LPF FEW (A) RARE   WBC, UA 0-2 <3 WBC/hpf   RBC / HPF 0-2 <3 RBC/hpf   Bacteria, UA FEW (A) RARE   Casts HYALINE CASTS (A) NEGATIVE  I-stat troponin, ED  Result Value Ref Range   Troponin i, poc 0.03 0.00 - 0.08 ng/mL   Comment 3           Ct Head Wo Contrast  09/03/2014   CLINICAL DATA:  81 year old male with confusion.  EXAM: CT HEAD WITHOUT CONTRAST  TECHNIQUE: Contiguous axial images were obtained from the base of the skull through the vertex without intravenous contrast.  COMPARISON:  None.  FINDINGS: Generalized atrophy and chronic small vessel ischemia. Encephalomalacia right MCA distribution. Remote prior infarcts in right cerebellum. Small lacunar infarcts in the right brainstem and left basal ganglia. No intracranial hemorrhage, mass effect, or midline shift. No hydrocephalus. The basilar cisterns are patent. No extra-axial fluid collection. Calvarium is intact. Mucous retention cyst in the right maxillary sinus. Minimal mucosal thickening of the right frontal sinus and ethmoid air cells. The mastoid air cells are well aerated.  IMPRESSION: 1. No evidence of acute intracranial abnormality. 2. Multifocal prior infarcts in the right MCA distribution, right cerebellum, right brainstem on left basal ganglia. Atrophy and chronic small vessel ischemic change.   Electronically Signed   By: Jeb Levering M.D.   On: 09/03/2014 22:21   1125 pm pt appears to bbe resting comfortably MDM  Confusion, vomiting may be secondary to hypertensive encephalopathy.  spoke with Dr.Niu and admit step down unit Final diagnoses:  None   Dx #1 acute encephalopatghy #2 nausea and vomiting #3renal insufficiency #4anemia CRITICAL CARE Performed by: Orlie Dakin Total critical care time: 30 minute Critical care time was exclusive of separately billable procedures and treating other patients. Critical care was necessary to treat or  prevent imminent or  life-threatening deterioration. Critical care was time spent personally by me on the following activities: development of treatment plan with patient and/or surrogate as well as nursing, discussions with consultants, evaluation of patient's response to treatment, examination of patient, obtaining history from patient or surrogate, ordering and performing treatments and interventions, ordering and review of laboratory studies, ordering and review of radiographic studies, pulse oximetry and re-evaluation of patient's condition.    Orlie Dakin, MD 09/03/14 2325

## 2014-09-03 NOTE — ED Notes (Addendum)
Per Dr. Winfred Leeds, gave pt water. Informed RN, also.

## 2014-09-03 NOTE — ED Notes (Signed)
Pt from [place] with dehydration.  Per family member, pt has been skipping meals and has been having some confusion.  Pt seen at Dr. Harrington Challenger for evaluation of UTI and was noted to be confused.  Pt CAO x 3.

## 2014-09-04 ENCOUNTER — Inpatient Hospital Stay (HOSPITAL_COMMUNITY): Payer: Medicare Other

## 2014-09-04 DIAGNOSIS — M79609 Pain in unspecified limb: Secondary | ICD-10-CM

## 2014-09-04 LAB — PROTIME-INR
INR: 2.09 — ABNORMAL HIGH (ref 0.00–1.49)
Prothrombin Time: 23.7 seconds — ABNORMAL HIGH (ref 11.6–15.2)

## 2014-09-04 LAB — COMPREHENSIVE METABOLIC PANEL
ALBUMIN: 3.1 g/dL — AB (ref 3.5–5.2)
ALT: 14 U/L (ref 0–53)
AST: 14 U/L (ref 0–37)
Alkaline Phosphatase: 90 U/L (ref 39–117)
Anion gap: 6 (ref 5–15)
BILIRUBIN TOTAL: 0.5 mg/dL (ref 0.3–1.2)
BUN: 24 mg/dL — ABNORMAL HIGH (ref 6–23)
CHLORIDE: 108 mmol/L (ref 96–112)
CO2: 23 mmol/L (ref 19–32)
Calcium: 8.5 mg/dL (ref 8.4–10.5)
Creatinine, Ser: 1.39 mg/dL — ABNORMAL HIGH (ref 0.50–1.35)
GFR calc Af Amer: 49 mL/min — ABNORMAL LOW (ref >90)
GFR, EST NON AFRICAN AMERICAN: 42 mL/min — AB (ref >90)
Glucose, Bld: 116 mg/dL — ABNORMAL HIGH (ref 70–99)
Potassium: 4.1 mmol/L (ref 3.5–5.1)
Sodium: 137 mmol/L (ref 135–145)
Total Protein: 5.8 g/dL — ABNORMAL LOW (ref 6.0–8.3)

## 2014-09-04 LAB — GLUCOSE, CAPILLARY: Glucose-Capillary: 110 mg/dL — ABNORMAL HIGH (ref 70–99)

## 2014-09-04 LAB — CBC
HCT: 30.9 % — ABNORMAL LOW (ref 39.0–52.0)
Hemoglobin: 9.7 g/dL — ABNORMAL LOW (ref 13.0–17.0)
MCH: 29.8 pg (ref 26.0–34.0)
MCHC: 31.4 g/dL (ref 30.0–36.0)
MCV: 94.8 fL (ref 78.0–100.0)
PLATELETS: 169 10*3/uL (ref 150–400)
RBC: 3.26 MIL/uL — ABNORMAL LOW (ref 4.22–5.81)
RDW: 14.4 % (ref 11.5–15.5)
WBC: 7 10*3/uL (ref 4.0–10.5)

## 2014-09-04 LAB — MRSA PCR SCREENING: MRSA by PCR: NEGATIVE

## 2014-09-04 MED ORDER — HYDROCODONE-ACETAMINOPHEN 5-325 MG PO TABS: 1.0000 | ORAL_TABLET | Freq: Four times a day (QID) | ORAL | Status: DC | PRN

## 2014-09-04 MED ORDER — ACETAMINOPHEN 650 MG RE SUPP: 650.0000 mg | Freq: Four times a day (QID) | RECTAL | Status: DC | PRN

## 2014-09-04 MED ORDER — ACETAMINOPHEN 325 MG PO TABS: 650.0000 mg | ORAL_TABLET | Freq: Four times a day (QID) | ORAL | Status: DC | PRN

## 2014-09-04 MED ORDER — ONDANSETRON HCL 4 MG/2ML IJ SOLN: 4.0000 mg | Freq: Three times a day (TID) | INTRAMUSCULAR | Status: DC | PRN

## 2014-09-04 MED ORDER — FAMOTIDINE 20 MG PO TABS
20.0000 mg | ORAL_TABLET | Freq: Every day | ORAL | Status: DC
  Administered 2014-09-04 – 2014-09-08 (×5): 20 mg via ORAL
  Filled 2014-09-04 (×5): qty 1

## 2014-09-04 MED ORDER — AMLODIPINE BESYLATE 10 MG PO TABS
10.0000 mg | ORAL_TABLET | Freq: Every day | ORAL | Status: DC
  Administered 2014-09-04 – 2014-09-08 (×6): 10 mg via ORAL
  Filled 2014-09-04: qty 1
  Filled 2014-09-04: qty 2
  Filled 2014-09-04 (×4): qty 1

## 2014-09-04 MED ORDER — SODIUM CHLORIDE 0.9 % IJ SOLN
3.0000 mL | Freq: Two times a day (BID) | INTRAMUSCULAR | Status: DC
  Administered 2014-09-04 – 2014-09-08 (×6): 3 mL via INTRAVENOUS

## 2014-09-04 MED ORDER — SODIUM CHLORIDE 0.9 % IV SOLN
INTRAVENOUS | Status: DC
  Administered 2014-09-04: 100 mL/h via INTRAVENOUS
  Administered 2014-09-04: 01:00:00 via INTRAVENOUS

## 2014-09-04 MED ORDER — VICKS VAPORUB 4.7-1.2-2.6 % EX OINT: 1.0000 "application " | TOPICAL_OINTMENT | Freq: Every evening | CUTANEOUS | Status: DC | PRN

## 2014-09-04 MED ORDER — ALBUTEROL SULFATE (2.5 MG/3ML) 0.083% IN NEBU: 2.5000 mg | INHALATION_SOLUTION | Freq: Four times a day (QID) | RESPIRATORY_TRACT | Status: DC | PRN

## 2014-09-04 MED ORDER — FERROUS FUMARATE 325 (106 FE) MG PO TABS
1.0000 | ORAL_TABLET | Freq: Two times a day (BID) | ORAL | Status: DC
  Administered 2014-09-04 – 2014-09-08 (×9): 106 mg via ORAL
  Filled 2014-09-04 (×13): qty 1

## 2014-09-04 MED ORDER — WARFARIN - PHARMACIST DOSING INPATIENT: Freq: Every day | Status: DC

## 2014-09-04 MED ORDER — ISOSORB DINITRATE-HYDRALAZINE 20-37.5 MG PO TABS
1.0000 | ORAL_TABLET | Freq: Two times a day (BID) | ORAL | Status: DC
  Administered 2014-09-04 – 2014-09-08 (×9): 1 via ORAL
  Filled 2014-09-04 (×13): qty 1

## 2014-09-04 MED ORDER — CARVEDILOL 12.5 MG PO TABS
12.5000 mg | ORAL_TABLET | Freq: Two times a day (BID) | ORAL | Status: DC
  Administered 2014-09-04 – 2014-09-08 (×9): 12.5 mg via ORAL
  Filled 2014-09-04 (×13): qty 1

## 2014-09-04 MED ORDER — DULOXETINE HCL 20 MG PO CPEP
20.0000 mg | ORAL_CAPSULE | Freq: Every day | ORAL | Status: DC
  Administered 2014-09-04 – 2014-09-08 (×5): 20 mg via ORAL
  Filled 2014-09-04 (×6): qty 1

## 2014-09-04 MED ORDER — FUROSEMIDE 10 MG/ML IJ SOLN
20.0000 mg | Freq: Once | INTRAMUSCULAR | Status: AC
  Administered 2014-09-04: 20 mg via INTRAVENOUS
  Filled 2014-09-04: qty 2

## 2014-09-04 MED ORDER — DONEPEZIL HCL 10 MG PO TABS
10.0000 mg | ORAL_TABLET | Freq: Every day | ORAL | Status: DC
  Administered 2014-09-04 – 2014-09-07 (×5): 10 mg via ORAL
  Filled 2014-09-04 (×5): qty 1
  Filled 2014-09-04: qty 2
  Filled 2014-09-04: qty 1

## 2014-09-04 MED ORDER — WARFARIN SODIUM 5 MG PO TABS
5.0000 mg | ORAL_TABLET | Freq: Once | ORAL | Status: AC
  Administered 2014-09-04: 5 mg via ORAL
  Filled 2014-09-04: qty 1

## 2014-09-04 MED ORDER — TRAMADOL HCL 50 MG PO TABS: 50.0000 mg | ORAL_TABLET | Freq: Four times a day (QID) | ORAL | Status: DC | PRN

## 2014-09-04 MED ORDER — DOCUSATE SODIUM 100 MG PO CAPS
100.0000 mg | ORAL_CAPSULE | Freq: Every day | ORAL | Status: DC
  Administered 2014-09-04 – 2014-09-07 (×5): 100 mg via ORAL
  Filled 2014-09-04 (×7): qty 1

## 2014-09-04 MED ORDER — SALINE SPRAY 0.65 % NA SOLN: 2.0000 | Freq: Four times a day (QID) | NASAL | Status: DC | PRN

## 2014-09-04 MED ORDER — ENSURE COMPLETE PO LIQD
237.0000 mL | Freq: Two times a day (BID) | ORAL | Status: DC
  Administered 2014-09-04 – 2014-09-08 (×8): 237 mL via ORAL

## 2014-09-04 NOTE — Progress Notes (Signed)
[facility] PROGRESS NOTE  Yaphet Smethurst BHA:[id number] DOB: 21-Apr-1919 DOA: 09/03/2014 PCP:  Melinda Crutch, MD  Assessment/Plan: 1. Altered mental status-likely due to dehydration, patient has underlying history of dementia. Discussed with patient's daughter who says that patient's mental status definitely has improved. All the lab work is negative for any infectious process. UA is clear chest x-ray shows no pneumonia. 2. A KI on C KD- patient came with mild a KI, received IV fluids and today BUN/creatinine is 24/1.39 3. Renal mass- ultrasound of kidney showed complex solid-appearing mass from the upper pole left kidney concerning for potential renal neoplasm. Called and discussed with patient's daughter [name], patient will need CT abdomen with contrast or MRI for further delineation of the mass. Currently patient is dehydrated, will give IV fluids. Further workup can be completed as outpatient. Daughter agrees with the plan 4. Dysuria- patient's UA did not show any abnormality. 5. Vomiting- no more episodes of vomiting, speech evaluation showed possible esophageal dysphagia, esophagram ordered. 6. Atrial fibrillation-CHA2DS2-VASc Score is 6, need oral anticoagulation. Patient is on coumadin at home. INR is 2.03. Continue coumadin per pharmacy. 7. CAD- Continue Coreg, bidil  Code Status: Full code Family Communication: * discussed with patient's daughter on phone  Disposition Plan: *home when stable   Consultants:  None  Procedures:  *None  Antibiotics:  None  HPI/Subjective: 81 year old male with a history of CAD, CVA, C KD, history of prostate cancer A. fib on Coumadin now came with altered mental status vomiting and difficulty urinating. Patient received IV fluids and his mental status has improved. Renal ultrasound done today showed renal mass.    Objective: Filed Vitals:   09/04/14 1300  BP: 131/54  Pulse:   Temp:   Resp: 21    Intake/Output Summary (Last 24  hours) at 09/04/14 1358 Last data filed at 09/04/14 1300  Gross per 24 hour  Intake 2240.67 ml  Output    200 ml  Net 2040.67 ml   Filed Weights   09/04/14 0048  Weight: 77.792 kg (171 lb 8 oz)    Exam:   General:  Appears in no acute distress  Cardiovascular: S1-S2 regular  Respiratory: Clear to auscultation bilaterally, no wheezing or crackles auscultated.  Abdomen: Soft, nontender no organomegaly  Musculoskeletal: Edema noted in the left lower extremity more than right   Data Reviewed: Basic Metabolic Panel:  Recent Labs Lab 09/03/14 1827 09/04/14 0342  NA 137 137  K 4.6 4.1  CL 103 108  CO2 25 23  GLUCOSE 126* 116*  BUN 27* 24*  CREATININE 1.64* 1.39*  CALCIUM 9.4 8.5   Liver Function Tests:  Recent Labs Lab 09/04/14 0342  AST 14  ALT 14  ALKPHOS 90  BILITOT 0.5  PROT 5.8*  ALBUMIN 3.1*   No results for input(s): LIPASE, AMYLASE in the last 168 hours. No results for input(s): AMMONIA in the last 168 hours. CBC:  Recent Labs Lab 09/03/14 1827 09/04/14 0342  WBC 7.5 7.0  HGB 10.8* 9.7*  HCT 34.0* 30.9*  MCV 94.7 94.8  PLT 219 169   Cardiac Enzymes: No results for input(s): CKTOTAL, CKMB, CKMBINDEX, TROPONINI in the last 168 hours. BNP (last 3 results) No results for input(s): BNP in the last 8760 hours.  ProBNP (last 3 results) No results for input(s): PROBNP in the last 8760 hours.  CBG:  Recent Labs Lab 09/04/14 0851  GLUCAP 110*    Recent Results (from the past 240 hour(s))  MRSA PCR Screening  Status: None   Collection Time: 09/04/14  7:12 AM  Result Value Ref Range Status   MRSA by PCR NEGATIVE NEGATIVE Final    Comment:        The GeneXpert MRSA Assay (FDA approved for NASAL specimens only), is one component of a comprehensive MRSA colonization surveillance program. It is not intended to diagnose MRSA infection nor to guide or monitor treatment for MRSA infections.      Studies: Ct Head Wo  Contrast  09/03/2014   CLINICAL DATA:  81 year old male with confusion.  EXAM: CT HEAD WITHOUT CONTRAST  TECHNIQUE: Contiguous axial images were obtained from the base of the skull through the vertex without intravenous contrast.  COMPARISON:  None.  FINDINGS: Generalized atrophy and chronic small vessel ischemia. Encephalomalacia right MCA distribution. Remote prior infarcts in right cerebellum. Small lacunar infarcts in the right brainstem and left basal ganglia. No intracranial hemorrhage, mass effect, or midline shift. No hydrocephalus. The basilar cisterns are patent. No extra-axial fluid collection. Calvarium is intact. Mucous retention cyst in the right maxillary sinus. Minimal mucosal thickening of the right frontal sinus and ethmoid air cells. The mastoid air cells are well aerated.  IMPRESSION: 1. No evidence of acute intracranial abnormality. 2. Multifocal prior infarcts in the right MCA distribution, right cerebellum, right brainstem on left basal ganglia. Atrophy and chronic small vessel ischemic change.   Electronically Signed   By: Jeb Levering M.D.   On: 09/03/2014 22:21   US Renal  09/04/2014   CLINICAL DATA:  Stage III chronic renal disease. Dysuria. History of prostate carcinoma  EXAM: RENAL/URINARY TRACT ULTRASOUND COMPLETE  COMPARISON:  None.  FINDINGS: Right Kidney:  Length: 10.6 cm. Echogenicity is increased. Renal cortical thickness is normal. No mass, perinephric fluid, or hydronephrosis visualized. No sonographically demonstrable calculus or ureterectasis.  Left Kidney:  Length: 10.9 cm. Echogenicity is slightly increased. Renal cortical thickness is normal.  There is a complex predominantly solid mass arising from the upper pole region measuring 5.8 x 5.1 x 5.4 cm. There is a cyst in the upper pole region measuring 1.8 x 1.4 x 2.0 cm. There is a cyst in the lower pole region measuring 1.4 x 1.2 x 1.0 cm. There is no perinephric fluid or hydronephrosis. There is no sonographically  demonstrable calculus or ureterectasis.  Bladder:  Appears normal for degree of bladder distention.  IMPRESSION: Complex predominantly solid-appearing mass arising from the upper pole left kidney. This finding is concerning for potential renal neoplasm. Further evaluation with pre and post contrast MRI or CT should be considered. MRI is preferred in younger patients (due to lack of ionizing radiation) and for evaluating calcified lesion(s).  Kidneys are echogenic, a finding that may be seen with medical renal disease. No hydronephrosis on either side.  These results will be called to the ordering clinician or representative by the Radiologist Assistant, and communication documented in the PACS or zVision Dashboard.   Electronically Signed   By: Lowella Grip III M.D.   On: 09/04/2014 07:28    Scheduled Meds: . amLODipine  10 mg Oral Daily  . carvedilol  12.5 mg Oral BID WC  . docusate sodium  100 mg Oral QHS  . donepezil  10 mg Oral QHS  . DULoxetine  20 mg Oral Daily  . famotidine  20 mg Oral Daily  . feeding supplement (ENSURE COMPLETE)  237 mL Oral BID BM  . ferrous fumarate  1 tablet Oral BID  . isosorbide-hydrALAZINE  1 tablet Oral  BID  . sodium chloride  3 mL Intravenous Q12H  . warfarin  5 mg Oral ONCE-1800  . Warfarin - Pharmacist Dosing Inpatient   Does not apply q1800   Continuous Infusions: . sodium chloride 100 mL/hr (09/04/14 1059)    Principal Problem:   Acute encephalopathy Active Problems:   Low back pain   Atrial fibrillation   Essential hypertension   CKD (chronic kidney disease) stage 3, GFR 30-59 ml/min   CAD (coronary artery disease)   Vomiting   Difficulty urinating    Time spent: 25 minutes   [facility] Pager [phone]. If 7PM-7AM, please contact night-coverage at [email or web address], password [facility] 09/04/2014, 1:58 PM  LOS: 1 day

## 2014-09-04 NOTE — Evaluation (Signed)
Clinical/Bedside Swallow Evaluation Patient Details  Name: Kurt Thomas MRN: [id number] Date of Birth: 1918/07/11  Today's Date: 09/04/2014 Time: SLP Start Time (ACUTE ONLY): 0930 SLP Stop Time (ACUTE ONLY): 0950 SLP Time Calculation (min) (ACUTE ONLY): 20 min  Past Medical History:  Past Medical History  Diagnosis Date  . High blood pressure   . Memory loss   . Low back pain   . Stroke   . Atrial fibrillation   . Prostate cancer   . CAD (coronary artery disease)    Past Surgical History:  Past Surgical History  Procedure Laterality Date  . Coronary artery bypass graft    . Prostatectomy    . Cataract extraction    . Hip arthroplasty Left 06/22/2014    Procedure: LEFT HEMIARTHROPLASTY HIP;  Surgeon: Meredith Pel, MD;  Location: [facility];  Service: Orthopedics;  Laterality: Left;   HPI:  Kurt Thomas is a 81 y.o. male with past medical history for coronary artery disease, history of stroke, chronic kidney disease -III, history of prostate cancer (post status of surgery, prostatectomy 20 years ago), atrial fibrillation on Coumadin, who presents with AMS, vomiting and difficult urinating. Per patient's son-in-law, patient has difficulty urinating and dysuria in the last week. He does not have fever or chills. He seems to be more confused than his baseline. Patient also has vomiting without nausea, diarrhea or abdominal pain. his vomiting always happens in the morning. MD questions difficulty swallowing.  CXR has not been done.  No recorded history of dysphagia in chart.    Assessment / Plan / Recommendation Clinical Impression  Pt demonstrates signs concerning for a primary esophageal dysphagia. Upon arrival pt was clearing mouth of excessive clear mucous. With thin liquid trials, swallow was slightly delayed, but no evidence of aspiration seen. Immediately following trials of solids, pt began coughing frequently until he gagged and spit out more thick, clear mucous. He confirmed that this  is what happens in the morning, after he eats. Following this, pt was able to consume thin liquids without further signs of aspiration. Concerned that pt may have difficulty with night time reflux which sometimes results in this mucous production in the am, which could impact transit of soldis. Recommend pt initiate a regular diet with thin liquids unless there is concern for pna (no CXR yet). Would also suggest GI assessment or esophageal assessment to further investigate pts function.     Aspiration Risk  Moderate    Diet Recommendation Regular;Thin liquid   Liquid Administration via: Cup;Straw Medication Administration: Whole meds with liquid Supervision: Patient able to self feed Compensations: Slow rate;Small sips/bites Postural Changes and/or Swallow Maneuvers: Seated upright 90 degrees;Upright 30-60 min after meal    Other  Recommendations Recommended Consults: Consider GI evaluation;Consider esophageal assessment Oral Care Recommendations: Oral care BID   Follow Up Recommendations  None    Frequency and Duration min 2x/week  1 week   Pertinent Vitals/Pain NA    SLP Swallow Goals     Swallow Study Prior Functional Status       General HPI: Kurt Thomas is a 81 y.o. male with past medical history for coronary artery disease, history of stroke, chronic kidney disease -III, history of prostate cancer (post status of surgery, prostatectomy 20 years ago), atrial fibrillation on Coumadin, who presents with AMS, vomiting and difficult urinating. Per patient's son-in-law, patient has difficulty urinating and dysuria in the last week. He does not have fever or chills. He seems to be more confused than  his baseline. Patient also has vomiting without nausea, diarrhea or abdominal pain. his vomiting always happens in the morning. MD questions difficulty swallowing.  CXR has not been done.  No recorded history of dysphagia in chart.  Type of Study: Bedside swallow evaluation Previous Swallow  Assessment: none Diet Prior to this Study: NPO Temperature Spikes Noted: No Respiratory Status: Room air History of Recent Intubation: No Behavior/Cognition: Alert;Cooperative;Pleasant mood Oral Cavity - Dentition: Poor condition;Missing dentition Self-Feeding Abilities: Able to feed self Patient Positioning: Upright in bed Baseline Vocal Quality: Clear Volitional Cough: Strong Volitional Swallow: Able to elicit    Oral/Motor/Sensory Function Overall Oral Motor/Sensory Function: Appears within functional limits for tasks assessed   Ice Chips     Thin Liquid Thin Liquid: Impaired Presentation: Cup;Straw;Self Fed Pharyngeal  Phase Impairments: Suspected delayed Swallow    Nectar Thick Nectar Thick Liquid: Not tested   Honey Thick Honey Thick Liquid: Not tested   Puree Puree: Within functional limits   Solid   GO    Solid: Impaired Presentation: Self Fed Pharyngeal Phase Impairments: Cough - Immediate      Supervised and reviewed by Herbie Baltimore MA CCC-SLP  Nasim Garofano, Katherene Ponto 09/04/2014,9:57 AM

## 2014-09-04 NOTE — Progress Notes (Signed)
ANTICOAGULATION CONSULT NOTE - Follow Up Consult  Pharmacy Consult for Coumadin Indication: atrial fibrillation  No Known Allergies   Vital Signs: Temp: 97.7 F (36.5 C) (02/26 1200) Temp Source: Oral (02/26 1200) BP: 131/54 mmHg (02/26 1300) Pulse Rate: 95 (02/26 1022)  Labs:  Recent Labs  09/03/14 1827 09/04/14 0342  HGB 10.8* 9.7*  HCT 34.0* 30.9*  PLT 219 169  LABPROT 23.2* 23.7*  INR 2.03* 2.09*  CREATININE 1.64* 1.39*    Medical History: Past Medical History  Diagnosis Date  . High blood pressure   . Memory loss   . Low back pain   . Stroke   . Atrial fibrillation   . Prostate cancer   . CAD (coronary artery disease)       Assessment: 81yo male presents from SNF for dehydration, family reports that pt has had confusion and has been skipping meals, to continue Coumadin for Afib during admission; current INR therapeutic. Patient takes Coumadin 5 mg daily except 2.5 mg on M/W/F. He missed his dose on 2/25. H/H low, Plt wnl.   Goal of Therapy:  INR 2-3   Plan:  Coumadin 5 mg x 1 dose. IF INR remains therapeutic tom, may consider resuming home dose Daily PT/INR and s/s of bleeding  Albertina Parr, PharmD., BCPS Clinical Pharmacist Pager [phone]

## 2014-09-04 NOTE — Progress Notes (Signed)
*  Preliminary Results* Bilateral lower extremity venous duplex completed. Bilateral lower extremities are negative for deep vein thrombosis. There is no evidence of Baker's cyst bilaterally.  09/04/2014  Maudry Mayhew, RVT, RDCS, RDMS

## 2014-09-04 NOTE — Progress Notes (Signed)
ANTICOAGULATION CONSULT NOTE - Initial Consult  Pharmacy Consult for Coumadin Indication: atrial fibrillation  No Known Allergies   Vital Signs: Temp: 97.7 F (36.5 C) (02/25 1814) Temp Source: Oral (02/25 1814) BP: 145/60 mmHg (02/25 2315) Pulse Rate: 95 (02/25 2315)  Labs:  Recent Labs  09/03/14 1827  HGB 10.8*  HCT 34.0*  PLT 219  LABPROT 23.2*  INR 2.03*  CREATININE 1.64*    Medical History: Past Medical History  Diagnosis Date  . High blood pressure   . Memory loss   . Low back pain   . Stroke   . Atrial fibrillation   . Prostate cancer   . CAD (coronary artery disease)       Assessment: 81yo male presents from SNF for dehydration, family reports that pt has had confusion and has been skipping meals, to continue Coumadin for Afib during admission; current INR therapeutic.  Goal of Therapy:  INR 2-3   Plan:  Will continue home Coumadin dose and monitor INR for dose adjustments.  Wynona Neat, PharmD, BCPS  09/04/2014,12:39 AM

## 2014-09-04 NOTE — Progress Notes (Signed)
Pt observed wheezy , finding it difficult to chew meat (dinner). MD notified. New orders received and carried out. 16 Fr. Foley placed and secured. Receiving RN updated on pt.'s status. Will monitor.   Pt's Daughter informed of pt's transfer. Pt's belongings packed and taken to new room 2C 13.

## 2014-09-04 NOTE — Progress Notes (Signed)
INITIAL NUTRITION ASSESSMENT  DOCUMENTATION CODES Per approved criteria  -Severe malnutrition in the context of chronic illness   INTERVENTION: Ensure Complete po BID, each supplement provides 350 kcal and 13 grams of protein  NUTRITION DIAGNOSIS: Malnutrition related to chronic illness as evidenced by 14% weight loss x 2 months and intake of </= 75% of his needs for >/= 1 month.   Goal: Pt to meet >/= 90% of their estimated nutrition needs   Monitor:  PO intake, supplement acceptance, weight trends  Reason for Assessment: Pt identified as at nutrition risk on the Malnutrition Screen Tool  81 y.o. male  Admitting Dx: Acute encephalopathy  ASSESSMENT: Pt admitted from assisted living facility with acute encephalopathy, elevated BP, difficult urinating, and hx of vomiting in the morning without nausea.  Pt seen by SLP this am and started on a Regular diet.  Noted work up in progress for renal mass.  Pt with 14% weight loss x 2 months. Pt states that he does not like the food at the ALF.  Breakfast is cheerios, fruit, toast Lunch is usually nothing (limited choices that pt does not like) Dinner is potatoes, veg, and meat. Per pt this meal is a little better but does not always like the options.   Labs: BUN elevated but decreasing  Nutrition Focused Physical Exam:  Subcutaneous Fat:  Orbital Region: WDL Upper Arm Region: mild depletion Thoracic and Lumbar Region: WDL  Muscle:  Temple Region: mild depletion Clavicle Bone Region: WDL Clavicle and Acromion Bone Region: mild depletion Scapular Bone Region: WNL  Dorsal Hand: mild/moderate depletion Patellar Region: WDL Anterior Thigh Region: WDL Posterior Calf Region: mild depletion  Edema: not present   Height: Ht Readings from Last 1 Encounters:  07/17/14 5\' 8"  (1.727 m)    Weight: Wt Readings from Last 1 Encounters:  09/04/14 171 lb 8 oz (77.792 kg)    Ideal Body Weight: 70 kg   % Ideal Body Weight:  111%  Wt Readings from Last 10 Encounters:  09/04/14 171 lb 8 oz (77.792 kg)  07/17/14 172 lb 9.6 oz (78.291 kg)  06/25/14 185 lb (83.915 kg)  06/24/14 198 lb 3.1 oz (89.9 kg)  05/07/14 200 lb (90.719 kg)  03/31/14 200 lb (90.719 kg)    Usual Body Weight: 198 lb 12/15  % Usual Body Weight: 86%  BMI:  Body mass index is 26.08 kg/(m^2).  Estimated Nutritional Needs: Kcal: 1600-1800 Protein: 90-100 grams Fluid: > 1.6 L/day  Skin: no issues noted   Diet Order: Diet regular  EDUCATION NEEDS: -No education needs identified at this time   Intake/Output Summary (Last 24 hours) at 09/04/14 1224 Last data filed at 09/04/14 1222  Gross per 24 hour  Intake 1740.67 ml  Output    200 ml  Net 1540.67 ml    Last BM: PTA   Labs:   Recent Labs Lab 09/03/14 1827 09/04/14 0342  NA 137 137  K 4.6 4.1  CL 103 108  CO2 25 23  BUN 27* 24*  CREATININE 1.64* 1.39*  CALCIUM 9.4 8.5  GLUCOSE 126* 116*    CBG (last 3)   Recent Labs  09/04/14 0851  GLUCAP 110*    Scheduled Meds: . amLODipine  10 mg Oral Daily  . carvedilol  12.5 mg Oral BID WC  . docusate sodium  100 mg Oral QHS  . donepezil  10 mg Oral QHS  . DULoxetine  20 mg Oral Daily  . famotidine  20 mg Oral Daily  .  feeding supplement (ENSURE COMPLETE)  237 mL Oral BID BM  . ferrous fumarate  1 tablet Oral BID  . isosorbide-hydrALAZINE  1 tablet Oral BID  . sodium chloride  3 mL Intravenous Q12H  . Warfarin - Pharmacist Dosing Inpatient   Does not apply q1800    Continuous Infusions: . sodium chloride 100 mL/hr (09/04/14 1059)    Past Medical History  Diagnosis Date  . High blood pressure   . Memory loss   . Low back pain   . Stroke   . Atrial fibrillation   . Prostate cancer   . CAD (coronary artery disease)     Past Surgical History  Procedure Laterality Date  . Coronary artery bypass graft    . Prostatectomy    . Cataract extraction    . Hip arthroplasty Left 06/22/2014    Procedure:  LEFT HEMIARTHROPLASTY HIP;  Surgeon: Meredith Pel, MD;  Location: [facility];  Service: Orthopedics;  Laterality: Left;    Maylon Peppers RD, [place], [place] Pager [phone] After Hours Pager

## 2014-09-04 NOTE — ED Notes (Signed)
Pt placed on hospital bed at this time, settled in for the night

## 2014-09-04 NOTE — Evaluation (Signed)
Physical Therapy Evaluation Patient Details Name: Kurt Thomas MRN: [id number] DOB: 1919-07-09 Today's Date: 09/04/2014   History of Present Illness  81 y.o. male with past medical history for coronary artery disease, history of stroke, chronic kidney disease -III, history of prostate cancer (post status of surgery, prostatectomy 20 years ago), atrial fibrillation on Coumadin, who presents with AMS, vomiting and difficult urinating.   Clinical Impression  Patient demonstrates deficits in functional mobility as indicated below. Will benefit from continued skilled PT to address deficits and maximize function. Will see as indicated and progress as tolerated. At this time, unclear PLOF, per chart review patient had complete rehabilitation at [facility] place in January after hip sx, and was living at [place]. Unclear level of support being provided. Patient poor historian and no family present at bedside for baseline comparison. OF NOTE: VSS during session    Follow Up Recommendations SNF;Supervision/Assistance - 24 hour    Equipment Recommendations  None recommended by PT    Recommendations for Other Services       Precautions / Restrictions Precautions Precautions: Fall Restrictions Weight Bearing Restrictions: No      Mobility  Bed Mobility Overal bed mobility: Needs Assistance Bed Mobility: Rolling;Sidelying to Sit;Sit to Sidelying Rolling: Supervision Sidelying to sit: Min assist     Sit to sidelying: Min assist General bed mobility comments: assist for UE support to pull to sitting, patient able to sequence positioning without cues, but reached for assist. Patient assisted for repositioning upon return to supine.   Transfers Overall transfer level: Needs assistance Equipment used: 1 person hand held assist Transfers: Sit to/from Stand Sit to Stand: Min assist         General transfer comment: Assist for stability, patient able to power up from bed  x2  Ambulation/Gait Ambulation/Gait assistance: Min assist Ambulation Distance (Feet): 5 Feet Assistive device: 1 person hand held assist Gait Pattern/deviations: Step-to pattern     General Gait Details: side steps to [place]            Wheelchair Mobility    Modified Rankin (Stroke Patients Only)       Balance Overall balance assessment: Needs assistance;History of Falls Sitting-balance support: Feet supported Sitting balance-Leahy Scale: Fair     Standing balance support: During functional activity Standing balance-Leahy Scale: Poor                               Pertinent Vitals/Pain Pain Assessment: No/denies pain    Home Living Family/patient expects to be discharged to:: Assisted living                 Additional Comments: patient was at [facility] for rehabilitation after hemi-hip arthroplasty in December, completed ST SNF and discharged to [facility].     Prior Function Level of Independence: Needs assistance   Gait / Transfers Assistance Needed:  (used RW and had 1 person assist per patient)     Comments: Patient reports that he has come assist with use of RW      Hand Dominance   Dominant Hand: Right    Extremity/Trunk Assessment   Upper Extremity Assessment: Defer to OT evaluation           Lower Extremity Assessment: Generalized weakness;LLE deficits/detail   LLE Deficits / Details: hx of L hemi arthroplasty dec 2015  Cervical / Trunk Assessment: Kyphotic  Communication   Communication: [facility]  Cognition Arousal/Alertness: Awake/alert Behavior During Therapy: Flat affect Overall Cognitive Status: No family/caregiver present to determine baseline cognitive functioning       Memory: Decreased short-term memory              General Comments      Exercises        Assessment/Plan    PT Assessment Patient needs continued PT services  PT Diagnosis Difficulty walking;Generalized  weakness;Altered mental status   PT Problem List Decreased strength;Decreased activity tolerance;Decreased balance;Decreased mobility;Decreased cognition;Pain  PT Treatment Interventions DME instruction;Gait training;Functional mobility training;Therapeutic activities;Therapeutic exercise;Balance training;Patient/family education   PT Goals (Current goals can be found in the Care Plan section) Acute Rehab PT Goals Patient Stated Goal: to lay down PT Goal Formulation: With patient Time For Goal Achievement: 09/18/14 Potential to Achieve Goals: Fair    Frequency Min 3X/week   Barriers to discharge        Co-evaluation               End of Session Equipment Utilized During Treatment: Gait belt Activity Tolerance: Patient tolerated treatment well;Patient limited by fatigue Patient left: in bed;with call bell/phone within reach;with bed alarm set Nurse Communication: Mobility status         Time: 7579-7282 PT Time Calculation (min) (ACUTE ONLY): 25 min   Charges:   PT Evaluation $Initial PT Evaluation Tier I: 1 Procedure PT Treatments $Therapeutic Activity: 8-22 mins   PT G CodesDuncan Dull 07-Sep-2014, 3:11 PM Alben Deeds, [place] DPT  [phone]

## 2014-09-05 DIAGNOSIS — I25810 Atherosclerosis of coronary artery bypass graft(s) without angina pectoris: Secondary | ICD-10-CM

## 2014-09-05 DIAGNOSIS — I493 Ventricular premature depolarization: Secondary | ICD-10-CM

## 2014-09-05 DIAGNOSIS — I482 Chronic atrial fibrillation: Secondary | ICD-10-CM

## 2014-09-05 DIAGNOSIS — I10 Essential (primary) hypertension: Secondary | ICD-10-CM

## 2014-09-05 DIAGNOSIS — I481 Persistent atrial fibrillation: Secondary | ICD-10-CM

## 2014-09-05 DIAGNOSIS — E43 Unspecified severe protein-calorie malnutrition: Secondary | ICD-10-CM | POA: Insufficient documentation

## 2014-09-05 LAB — BASIC METABOLIC PANEL
ANION GAP: 5 (ref 5–15)
BUN: 21 mg/dL (ref 6–23)
CALCIUM: 8.7 mg/dL (ref 8.4–10.5)
CHLORIDE: 107 mmol/L (ref 96–112)
CO2: 25 mmol/L (ref 19–32)
CREATININE: 1.46 mg/dL — AB (ref 0.50–1.35)
GFR calc Af Amer: 46 mL/min — ABNORMAL LOW (ref >90)
GFR, EST NON AFRICAN AMERICAN: 40 mL/min — AB (ref >90)
GLUCOSE: 112 mg/dL — AB (ref 70–99)
Potassium: 3.7 mmol/L (ref 3.5–5.1)
Sodium: 137 mmol/L (ref 135–145)

## 2014-09-05 LAB — URINE CULTURE: Colony Count: >=100000

## 2014-09-05 LAB — PROTIME-INR
INR: 2.24 — ABNORMAL HIGH (ref 0.00–1.49)
Prothrombin Time: 25 seconds — ABNORMAL HIGH (ref 11.6–15.2)

## 2014-09-05 LAB — GLUCOSE, CAPILLARY: GLUCOSE-CAPILLARY: 127 mg/dL — AB (ref 70–99)

## 2014-09-05 LAB — MAGNESIUM: Magnesium: 1.8 mg/dL (ref 1.5–2.5)

## 2014-09-05 MED ORDER — WARFARIN SODIUM 2.5 MG PO TABS
2.5000 mg | ORAL_TABLET | Freq: Once | ORAL | Status: AC
  Administered 2014-09-05: 2.5 mg via ORAL
  Filled 2014-09-05: qty 1

## 2014-09-05 NOTE — Consult Note (Addendum)
CARDIOLOGY CONSULT NOTE     Primary Care Physician:  Melinda Crutch, MD Referring Physician: [facility] hospitalist  Admit Date: 09/03/2014  Reason for consultation:  arrhythmia  Kurt Thomas is a 81 y.o. male with a h/o dementia, atrial fibrillation, hypertension, and CAD admitted for AMS and vomiting.  During his hospital stay, he has been observed on telemetry to have rate controlled afib as well as frequent PVCs.  He is asymptomatic with these.  He is pleasantly confused.  He has a known history of afib as is on coumadin. Today, he denies symptoms of palpitations, chest pain, shortness of breath, orthopnea, PND, lower extremity edema, dizziness, presyncope, syncope, or neurologic sequela. The patient is tolerating medications without difficulties and is otherwise without complaint today.   Past Medical History  Diagnosis Date  . High blood pressure   . Memory loss   . Low back pain   . Stroke   . Atrial fibrillation   . Prostate cancer   . CAD (coronary artery disease)    Past Surgical History  Procedure Laterality Date  . Coronary artery bypass graft    . Prostatectomy    . Cataract extraction    . Hip arthroplasty Left 06/22/2014    Procedure: LEFT HEMIARTHROPLASTY HIP;  Surgeon: Meredith Pel, MD;  Location: [facility] ORS;  Service: Orthopedics;  Laterality: Left;    . amLODipine  10 mg Oral Daily  . carvedilol  12.5 mg Oral BID WC  . docusate sodium  100 mg Oral QHS  . donepezil  10 mg Oral QHS  . DULoxetine  20 mg Oral Daily  . famotidine  20 mg Oral Daily  . feeding supplement (ENSURE COMPLETE)  237 mL Oral BID BM  . ferrous fumarate  1 tablet Oral BID  . isosorbide-hydrALAZINE  1 tablet Oral BID  . sodium chloride  3 mL Intravenous Q12H  . warfarin  2.5 mg Oral ONCE-1800  . Warfarin - Pharmacist Dosing Inpatient   Does not apply q1800      No Known Allergies  History   Social History  . Marital Status: Married    Spouse Name: N/A  . Number of Children: 1  . Years  of Education: college   Occupational History  .      Retired   Social History Main Topics  . Smoking status: Former Smoker -- 1.00 packs/day for 1 years    Types: Cigarettes    Quit date: 07/10/1956  . Smokeless tobacco: Never Used  . Alcohol Use: No  . Drug Use: No  . Sexual Activity: Not on file   Other Topics Concern  . Not on file   Social History Narrative   Patient is retired and Lives in [place]    Education college    Right handed.    Family History  Problem Relation Age of Onset  . High blood pressure    . Cancer Father     ROS- pt is confused and unable to provide full ROS  Physical Exam: Telemetry: rate controlled afib with PVCs, rare nonsustained VT Filed Vitals:   09/05/14 0357 09/05/14 0756 09/05/14 0917 09/05/14 1138  BP: 166/65 192/92 163/85 111/68  Pulse: 85 88  89  Temp: 97.8 F (36.6 C) 98 F (36.7 C)  97.7 F (36.5 C)  TempSrc: Oral Oral  Oral  Resp: 24 22  29   Weight: 161 lb 6 oz (73.2 kg)     SpO2: 93% 93%      GEN-  The patient is elderly appearing, alert but confused Head- normocephalic, atraumatic Eyes-  Sclera clear, conjunctiva pink Ears- hearing intact Oropharynx- clear Neck- supple,   Lungs- Clear to ausculation bilaterally, normal work of breathing Heart- irregular rate and rhythm  GI- soft, NT, ND, + BS Extremities- no clubbing, cyanosis, or edema MS- no significant deformity or atrophy Skin- no rash or lesion Psych- euthymic mood, full affect Neuro- strength and sensation are intact  EKG: reveals rate controlled afib with PCVs, no ischemic changes  Labs:   Lab Results  Component Value Date   WBC 7.0 09/04/2014   HGB 9.7* 09/04/2014   HCT 30.9* 09/04/2014   MCV 94.8 09/04/2014   PLT 169 09/04/2014    Recent Labs Lab 09/04/14 0342 09/05/14 0423  NA 137 137  K 4.1 3.7  CL 108 107  CO2 23 25  BUN 24* 21  CREATININE 1.39* 1.46*  CALCIUM 8.5 8.7  PROT 5.8*  --   BILITOT 0.5  --   ALKPHOS 90  --     ALT 14  --   AST 14  --   GLUCOSE 116* 112*   Lab Results  Component Value Date   CKTOTAL 153 06/20/2014   CKMB 2.5 06/20/2014   TROPONINI <0.30 06/20/2014   No results found for: CHOL No results found for: HDL No results found for: LDLCALC No results found for: TRIG No results found for: CHOLHDL No results found for: LDLDIRECT     ASSESSMENT AND PLAN:   1. Persistent afib The patient has known afib for which he is rate controlled and asymptomatic.  He is on coreg for rate control. His chads2vasc score is at least 6.  He is on coumadin for stroke prevention. No further workup is planned  2. PVCs Asymptomatic Given advanced age, no further workup is suggested Continue current coreg dose  3. HTN Stable No change required today  4. CAD No ischemic symptoms No further workup  Will DC telemetry His long term prognosis is poor  Ultimately, palliative measures and code status should be considered.  Cardiology to see as needed Please call with questions  Thompson Grayer, MD 09/05/2014  3:03 PM

## 2014-09-05 NOTE — Progress Notes (Signed)
ANTICOAGULATION CONSULT NOTE - Follow Up Consult  Pharmacy Consult for warfarin Indication: atrial fibrillation  No Known Allergies  Patient Measurements: Weight: 161 lb 6 oz (73.2 kg)  Vital Signs: Temp: 98 F (36.7 C) (02/27 0756) Temp Source: Oral (02/27 0756) BP: 163/85 mmHg (02/27 0917) Pulse Rate: 88 (02/27 0756)  Labs:  Recent Labs  09/03/14 1827 09/04/14 0342 09/05/14 0423  HGB 10.8* 9.7*  --   HCT 34.0* 30.9*  --   PLT 219 169  --   LABPROT 23.2* 23.7* 25.0*  INR 2.03* 2.09* 2.24*  CREATININE 1.64* 1.39* 1.46*    Estimated Creatinine Clearance: 30.6 mL/min (by C-G formula based on Cr of 1.46).   Medications:  Scheduled:  . amLODipine  10 mg Oral Daily  . carvedilol  12.5 mg Oral BID WC  . docusate sodium  100 mg Oral QHS  . donepezil  10 mg Oral QHS  . DULoxetine  20 mg Oral Daily  . famotidine  20 mg Oral Daily  . feeding supplement (ENSURE COMPLETE)  237 mL Oral BID BM  . ferrous fumarate  1 tablet Oral BID  . isosorbide-hydrALAZINE  1 tablet Oral BID  . sodium chloride  3 mL Intravenous Q12H  . Warfarin - Pharmacist Dosing Inpatient   Does not apply q1800    Assessment: 81yo male presents from SNF for dehydration, family reports that pt has had confusion and has been skipping meals,  Continuing Coumadin for Afib during admission; current INR therapeutic at 2.24. PTA dose 5 mg daily except 2.5 mg on M/W/F - missed dose on 2/25. Hgb 2/26 9.7, PTLC 169. No bleeding noted.  Goal of Therapy:  INR 2-3 Monitor platelets by anticoagulation protocol: Yes   Plan:  Coumadin 2.5mg  x 1 tonight Daily PT/INR  Monitor s/sx of bleeding  Thank you for allowing pharmacy to be part of this patient's care team  [place], Pharm.D Clinical Pharmacy Resident Pager: [phone] 09/05/2014 .9:21 AM

## 2014-09-05 NOTE — Progress Notes (Signed)
[facility] PROGRESS NOTE  Kurt Thomas WUJ:[id number] DOB: 03-04-1919 DOA: 09/03/2014 PCP:  Melinda Crutch, MD  Assessment/Plan: 1. Altered mental status-likely due to dehydration, patient has underlying history of dementia. Discussed with patient's daughter who says that patient's mental status definitely has improved. All the lab work is negative for any infectious process. UA is clear chest x-ray shows no pneumonia. 2. NSVT- pateint having the episodes of NSVT, bigeminy. Will obtain the cardiology consultation. 3. A KI on C KD- patient came with mild a KI, received IV fluids and today BUN/creatinine is 21/1.46 4. Renal mass- ultrasound of kidney showed complex solid-appearing mass from the upper pole left kidney concerning for potential renal neoplasm. Called and discussed with patient's daughter Kurt Thomas, patient will need CT abdomen with contrast or MRI for further delineation of the mass. Currently patient is dehydrated, will give IV fluids. Further workup can be completed as outpatient. Daughter agrees with the plan 5. Dysuria- patient's UA did not show any abnormality. 6. Vomiting- no more episodes of vomiting, speech evaluation showed possible esophageal dysphagia, esophagram ordered. 7. Atrial fibrillation-CHA2DS2-VASc Score is 6, need oral anticoagulation. Patient is on coumadin at home. INR is 2.03. Continue coumadin per pharmacy. 8. CAD- Continue Coreg, bidil  Code Status: Full code Family Communication: * discussed with patient's daughter on phone  Disposition Plan: *home when stable   Consultants:  None  Procedures:  *None  Antibiotics:  None  HPI/Subjective: 81 year old male with a history of CAD, CVA, C KD, history of prostate cancer A. fib on Coumadin now came with altered mental status vomiting and difficulty urinating. Patient received IV fluids and his mental status has improved. Renal ultrasound done  showed renal mass.  Patient denies any  complaints.    Objective: Filed Vitals:   09/05/14 0917  BP: 163/85  Pulse:   Temp:   Resp:     Intake/Output Summary (Last 24 hours) at 09/05/14 1046 Last data filed at 09/05/14 1000  Gross per 24 hour  Intake   1238 ml  Output   1950 ml  Net   -712 ml   Filed Weights   09/04/14 0048 09/05/14 0357  Weight: 77.792 kg (171 lb 8 oz) 73.2 kg (161 lb 6 oz)    Exam:   General:  Appears in no acute distress  Cardiovascular: S1-S2 regular  Respiratory: Clear to auscultation bilaterally, no wheezing or crackles auscultated.  Abdomen: Soft, nontender no organomegaly  Musculoskeletal: Edema noted in the left lower extremity more than right   Data Reviewed: Basic Metabolic Panel:  Recent Labs Lab 09/03/14 1827 09/04/14 0342 09/05/14 0423  NA 137 137 137  K 4.6 4.1 3.7  CL 103 108 107  CO2 25 23 25   GLUCOSE 126* 116* 112*  BUN 27* 24* 21  CREATININE 1.64* 1.39* 1.46*  CALCIUM 9.4 8.5 8.7  MG  --   --  1.8   Liver Function Tests:  Recent Labs Lab 09/04/14 0342  AST 14  ALT 14  ALKPHOS 90  BILITOT 0.5  PROT 5.8*  ALBUMIN 3.1*   No results for input(s): LIPASE, AMYLASE in the last 168 hours. No results for input(s): AMMONIA in the last 168 hours. CBC:  Recent Labs Lab 09/03/14 1827 09/04/14 0342  WBC 7.5 7.0  HGB 10.8* 9.7*  HCT 34.0* 30.9*  MCV 94.7 94.8  PLT 219 169   Cardiac Enzymes: No results for input(s): CKTOTAL, CKMB, CKMBINDEX, TROPONINI in the last 168 hours. BNP (last 3 results) No results  for input(s): BNP in the last 8760 hours.  ProBNP (last 3 results) No results for input(s): PROBNP in the last 8760 hours.  CBG:  Recent Labs Lab 09/04/14 0851 09/05/14 0819  GLUCAP 110* 127*    Recent Results (from the past 240 hour(s))  MRSA PCR Screening     Status: None   Collection Time: 09/04/14  7:12 AM  Result Value Ref Range Status   MRSA by PCR NEGATIVE NEGATIVE Final    Comment:        The GeneXpert MRSA Assay  (FDA approved for NASAL specimens only), is one component of a comprehensive MRSA colonization surveillance program. It is not intended to diagnose MRSA infection nor to guide or monitor treatment for MRSA infections.      Studies: Ct Head Wo Contrast  09/03/2014   CLINICAL DATA:  81 year old male with confusion.  EXAM: CT HEAD WITHOUT CONTRAST  TECHNIQUE: Contiguous axial images were obtained from the base of the skull through the vertex without intravenous contrast.  COMPARISON:  None.  FINDINGS: Generalized atrophy and chronic small vessel ischemia. Encephalomalacia right MCA distribution. Remote prior infarcts in right cerebellum. Small lacunar infarcts in the right brainstem and left basal ganglia. No intracranial hemorrhage, mass effect, or midline shift. No hydrocephalus. The basilar cisterns are patent. No extra-axial fluid collection. Calvarium is intact. Mucous retention cyst in the right maxillary sinus. Minimal mucosal thickening of the right frontal sinus and ethmoid air cells. The mastoid air cells are well aerated.  IMPRESSION: 1. No evidence of acute intracranial abnormality. 2. Multifocal prior infarcts in the right MCA distribution, right cerebellum, right brainstem on left basal ganglia. Atrophy and chronic small vessel ischemic change.   Electronically Signed   By: Jeb Levering M.D.   On: 09/03/2014 22:21   US Renal  09/04/2014   CLINICAL DATA:  Stage III chronic renal disease. Dysuria. History of prostate carcinoma  EXAM: RENAL/URINARY TRACT ULTRASOUND COMPLETE  COMPARISON:  None.  FINDINGS: Right Kidney:  Length: 10.6 cm. Echogenicity is increased. Renal cortical thickness is normal. No mass, perinephric fluid, or hydronephrosis visualized. No sonographically demonstrable calculus or ureterectasis.  Left Kidney:  Length: 10.9 cm. Echogenicity is slightly increased. Renal cortical thickness is normal.  There is a complex predominantly solid mass arising from the upper pole  region measuring 5.8 x 5.1 x 5.4 cm. There is a cyst in the upper pole region measuring 1.8 x 1.4 x 2.0 cm. There is a cyst in the lower pole region measuring 1.4 x 1.2 x 1.0 cm. There is no perinephric fluid or hydronephrosis. There is no sonographically demonstrable calculus or ureterectasis.  Bladder:  Appears normal for degree of bladder distention.  IMPRESSION: Complex predominantly solid-appearing mass arising from the upper pole left kidney. This finding is concerning for potential renal neoplasm. Further evaluation with pre and post contrast MRI or CT should be considered. MRI is preferred in younger patients (due to lack of ionizing radiation) and for evaluating calcified lesion(s).  Kidneys are echogenic, a finding that may be seen with medical renal disease. No hydronephrosis on either side.  These results will be called to the ordering clinician or representative by the Radiologist Assistant, and communication documented in the PACS or zVision Dashboard.   Electronically Signed   By: Lowella Grip III M.D.   On: 09/04/2014 07:28    Scheduled Meds: . amLODipine  10 mg Oral Daily  . carvedilol  12.5 mg Oral BID WC  . docusate sodium  100  mg Oral QHS  . donepezil  10 mg Oral QHS  . DULoxetine  20 mg Oral Daily  . famotidine  20 mg Oral Daily  . feeding supplement (ENSURE COMPLETE)  237 mL Oral BID BM  . ferrous fumarate  1 tablet Oral BID  . isosorbide-hydrALAZINE  1 tablet Oral BID  . sodium chloride  3 mL Intravenous Q12H  . warfarin  2.5 mg Oral ONCE-1800  . Warfarin - Pharmacist Dosing Inpatient   Does not apply q1800   Continuous Infusions:    Principal Problem:   Acute encephalopathy Active Problems:   Low back pain   Atrial fibrillation   Essential hypertension   CKD (chronic kidney disease) stage 3, GFR 30-59 ml/min   CAD (coronary artery disease)   Vomiting   Difficulty urinating   Protein-calorie malnutrition, severe    Time spent: 25 minutes   [place] [facility] Pager [phone]. If 7PM-7AM, please contact night-coverage at [email or web address], password [facility] 09/05/2014, 10:46 AM  LOS: 2 days

## 2014-09-06 ENCOUNTER — Inpatient Hospital Stay (HOSPITAL_COMMUNITY): Payer: Medicare Other

## 2014-09-06 LAB — GLUCOSE, CAPILLARY: GLUCOSE-CAPILLARY: 118 mg/dL — AB (ref 70–99)

## 2014-09-06 LAB — PROTIME-INR
INR: 2.29 — ABNORMAL HIGH (ref 0.00–1.49)
PROTHROMBIN TIME: 25.4 s — AB (ref 11.6–15.2)

## 2014-09-06 MED ORDER — RESOURCE THICKENUP CLEAR PO POWD
ORAL | Status: DC | PRN
  Filled 2014-09-06: qty 125

## 2014-09-06 MED ORDER — WARFARIN SODIUM 5 MG PO TABS
5.0000 mg | ORAL_TABLET | Freq: Once | ORAL | Status: AC
  Administered 2014-09-06: 5 mg via ORAL
  Filled 2014-09-06: qty 1

## 2014-09-06 MED ORDER — WARFARIN VIDEO
Freq: Once | Status: AC
  Administered 2014-09-06: 10:00:00

## 2014-09-06 NOTE — Progress Notes (Signed)
ANTICOAGULATION CONSULT NOTE - Follow Up Consult  Pharmacy Consult for warfarin Indication: atrial fibrillation  No Known Allergies  Patient Measurements: Weight: 176 lb 12.9 oz (80.2 kg)  Vital Signs: Temp: 97.6 F (36.4 C) (02/28 0313) Temp Source: Oral (02/28 0313) BP: 172/62 mmHg (02/28 0400) Pulse Rate: 160 (02/28 0400)  Labs:  Recent Labs  09/03/14 1827 09/04/14 0342 09/05/14 0423 09/06/14 0358  HGB 10.8* 9.7*  --   --   HCT 34.0* 30.9*  --   --   PLT 219 169  --   --   LABPROT 23.2* 23.7* 25.0* 25.4*  INR 2.03* 2.09* 2.24* 2.29*  CREATININE 1.64* 1.39* 1.46*  --     Estimated Creatinine Clearance: 30.6 mL/min (by C-G formula based on Cr of 1.46).   Medications:  Scheduled:  . amLODipine  10 mg Oral Daily  . carvedilol  12.5 mg Oral BID WC  . docusate sodium  100 mg Oral QHS  . donepezil  10 mg Oral QHS  . DULoxetine  20 mg Oral Daily  . famotidine  20 mg Oral Daily  . feeding supplement (ENSURE COMPLETE)  237 mL Oral BID BM  . ferrous fumarate  1 tablet Oral BID  . isosorbide-hydrALAZINE  1 tablet Oral BID  . sodium chloride  3 mL Intravenous Q12H  . Warfarin - Pharmacist Dosing Inpatient   Does not apply q1800    Assessment: 81yo male presents from SNF for dehydration, family reports that pt has had confusion and has been skipping meals, to continue Coumadin for Afib during admission; CHA2DS2VASc=6, current INR therapeutic at 2.29. PTA dose 5 mg daily except 2.5 mg on M/W/F. CBC on  2/26 shows Hgb 9.7, PTLC 169. No bleeding noted.  Goal of Therapy:  INR 2-3 Monitor platelets by anticoagulation protocol: Yes   Plan:  Coumadin 5 mg x 1 tonight Daily PT/INR  Monitor s/sx of bleeding Warfarin edu planned for 2/28  Thank you for allowing pharmacy to be part of this patient's care team  [place], Pharm.D Clinical Pharmacy Resident Pager: [phone] 09/06/2014 .8:59 AM

## 2014-09-06 NOTE — Progress Notes (Signed)
[facility] PROGRESS NOTE  Kurt Thomas KZS:[id number] DOB: 13-Dec-1918 DOA: 09/03/2014 PCP:  Kurt Crutch, MD  Assessment/Plan: 1. Altered mental status-likely due to dehydration, patient has underlying history of dementia. Discussed with patient's daughter who says that patient's mental status definitely has improved. All the lab work is negative for any infectious process. UA is clear chest x-ray shows no pneumonia. 2. NSVT- pateint having the episodes of NSVT, bigeminy. Cardiology was consulted, and no plans for further workup. Cartilage is recommended palliative care discussion. We will discontinue telemetry 3. A KI on C KD- patient came with mild a KI, received IV fluids and today BUN/creatinine is 21/1.46 4. Renal mass- ultrasound of kidney showed complex solid-appearing mass from the upper pole left kidney concerning for potential renal neoplasm. Called and discussed with patient's daughter Kurt Thomas, patient will need CT abdomen with contrast or MRI for further delineation of the mass. Currently patient is dehydrated, will give IV fluids. Further workup can be completed as outpatient. Daughter agrees with the plan 5. Dysuria- patient's UA did not show any abnormality. 6. Vomiting- no more episodes of vomiting, speech evaluation showed possible esophageal dysphagia, esophagram ordered. 7. Atrial fibrillation-CHA2DS2-VASc Score is 6, need oral anticoagulation. Patient is on coumadin at home. INR is 2.03. Continue coumadin per pharmacy. 8. CAD- Continue Coreg, bidil  Code Status: Full code Family Communication: * discussed with patient's daughter on phone, daughter agrees for palliative care consultation for further goals of care as patient has chronic medical problems including with new diagnosis of renal mass. Disposition Plan: *To be decided   Consultants:  None  Procedures:  *None  Antibiotics:  None  HPI/Subjective: 81 year old male with a history of CAD, CVA, C KD,  history of prostate cancer A. fib on Coumadin now came with altered mental status vomiting and difficulty urinating. Patient received IV fluids and his mental status has improved. Renal ultrasound done  showed renal mass.  Patient denies any complaints. Wants to get off telemetry    Objective: Filed Vitals:   09/06/14 0931  BP: 162/87  Pulse: 90  Temp: 98 F (36.7 C)  Resp: 20    Intake/Output Summary (Last 24 hours) at 09/06/14 1025 Last data filed at 09/06/14 0931  Gross per 24 hour  Intake    340 ml  Output   1300 ml  Net   -960 ml   Filed Weights   09/04/14 0048 09/05/14 0357 09/06/14 0313  Weight: 77.792 kg (171 lb 8 oz) 73.2 kg (161 lb 6 oz) 80.2 kg (176 lb 12.9 oz)    Exam:   General:  Appears in no acute distress  Cardiovascular: S1-S2 regular  Respiratory: Clear to auscultation bilaterally, no wheezing or crackles auscultated.  Abdomen: Soft, nontender no organomegaly  Musculoskeletal: Edema noted in the left lower extremity more than right   Data Reviewed: Basic Metabolic Panel:  Recent Labs Lab 09/03/14 1827 09/04/14 0342 09/05/14 0423  NA 137 137 137  K 4.6 4.1 3.7  CL 103 108 107  CO2 25 23 25   GLUCOSE 126* 116* 112*  BUN 27* 24* 21  CREATININE 1.64* 1.39* 1.46*  CALCIUM 9.4 8.5 8.7  MG  --   --  1.8   Liver Function Tests:  Recent Labs Lab 09/04/14 0342  AST 14  ALT 14  ALKPHOS 90  BILITOT 0.5  PROT 5.8*  ALBUMIN 3.1*   No results for input(s): LIPASE, AMYLASE in the last 168 hours. No results for input(s): AMMONIA in the last  168 hours. CBC:  Recent Labs Lab 09/03/14 1827 09/04/14 0342  WBC 7.5 7.0  HGB 10.8* 9.7*  HCT 34.0* 30.9*  MCV 94.7 94.8  PLT 219 169   Cardiac Enzymes: No results for input(s): CKTOTAL, CKMB, CKMBINDEX, TROPONINI in the last 168 hours. BNP (last 3 results) No results for input(s): BNP in the last 8760 hours.  ProBNP (last 3 results) No results for input(s): PROBNP in the last 8760  hours.  CBG:  Recent Labs Lab 09/04/14 0851 09/05/14 0819 09/06/14 0953  GLUCAP 110* 127* 118*    Recent Results (from the past 240 hour(s))  Urine culture     Status: None   Collection Time: 09/03/14  8:25 PM  Result Value Ref Range Status   Specimen Description URINE, CLEAN CATCH  Final   Special Requests NONE  Final   Colony Count   Final    >=100,000 COLONIES/ML Performed at [facility]    Culture   Final    Multiple bacterial morphotypes present, none predominant. Suggest appropriate recollection if clinically indicated. Performed at [facility]    Report Status 09/05/2014 FINAL  Final  MRSA PCR Screening     Status: None   Collection Time: 09/04/14  7:12 AM  Result Value Ref Range Status   MRSA by PCR NEGATIVE NEGATIVE Final    Comment:        The GeneXpert MRSA Assay (FDA approved for NASAL specimens only), is one component of a comprehensive MRSA colonization surveillance program. It is not intended to diagnose MRSA infection nor to guide or monitor treatment for MRSA infections.      Studies: No results found.  Scheduled Meds: . amLODipine  10 mg Oral Daily  . carvedilol  12.5 mg Oral BID WC  . docusate sodium  100 mg Oral QHS  . donepezil  10 mg Oral QHS  . DULoxetine  20 mg Oral Daily  . famotidine  20 mg Oral Daily  . feeding supplement (ENSURE COMPLETE)  237 mL Oral BID BM  . ferrous fumarate  1 tablet Oral BID  . isosorbide-hydrALAZINE  1 tablet Oral BID  . sodium chloride  3 mL Intravenous Q12H  . warfarin  5 mg Oral ONCE-1800  . Warfarin - Pharmacist Dosing Inpatient   Does not apply q1800   Continuous Infusions:    Principal Problem:   Acute encephalopathy Active Problems:   Low back pain   Atrial fibrillation   Essential hypertension   CKD (chronic kidney disease) stage 3, GFR 30-59 ml/min   CAD (coronary artery disease)   Vomiting   Difficulty urinating   Protein-calorie malnutrition, severe   PVC's  (premature ventricular contractions)    Time spent: 25 minutes   [facility] Pager [phone]. If 7PM-7AM, please contact night-coverage at [email or web address], password [facility] 09/06/2014, 10:25 AM  LOS: 3 days

## 2014-09-07 ENCOUNTER — Inpatient Hospital Stay (HOSPITAL_COMMUNITY): Payer: Medicare Other

## 2014-09-07 LAB — PROTIME-INR
INR: 2.32 — AB (ref 0.00–1.49)
PROTHROMBIN TIME: 25.7 s — AB (ref 11.6–15.2)

## 2014-09-07 LAB — GLUCOSE, CAPILLARY: Glucose-Capillary: 131 mg/dL — ABNORMAL HIGH (ref 70–99)

## 2014-09-07 MED ORDER — WARFARIN SODIUM 2.5 MG PO TABS
2.5000 mg | ORAL_TABLET | Freq: Once | ORAL | Status: AC
  Administered 2014-09-07: 2.5 mg via ORAL
  Filled 2014-09-07: qty 1

## 2014-09-07 NOTE — Clinical Social Work Note (Signed)
CSW confirmed that patient is from [place] from [facility] will have to evaluate patient prior to patients return to the facility- plans on visiting pt 09/08/14  CSW will continue to follow.  Domenica Reamer, [place] Social Worker [phone]

## 2014-09-07 NOTE — Progress Notes (Signed)
ANTICOAGULATION CONSULT NOTE - Follow Up Consult  Pharmacy Consult for warfarin Indication: atrial fibrillation  No Known Allergies  Patient Measurements: Weight: 173 lb 15.1 oz (78.9 kg)  Vital Signs: Temp: 98 F (36.7 C) (02/29 0751) Temp Source: Oral (02/29 0751) BP: 158/94 mmHg (02/29 0751) Pulse Rate: 99 (02/29 0751)  Labs:  Recent Labs  09/05/14 0423 09/06/14 0358 09/07/14 0547  LABPROT 25.0* 25.4* 25.7*  INR 2.24* 2.29* 2.32*  CREATININE 1.46*  --   --     Estimated Creatinine Clearance: 30.6 mL/min (by C-G formula based on Cr of 1.46).   Medications:  Scheduled:  . amLODipine  10 mg Oral Daily  . carvedilol  12.5 mg Oral BID WC  . docusate sodium  100 mg Oral QHS  . donepezil  10 mg Oral QHS  . DULoxetine  20 mg Oral Daily  . famotidine  20 mg Oral Daily  . feeding supplement (ENSURE COMPLETE)  237 mL Oral BID BM  . ferrous fumarate  1 tablet Oral BID  . isosorbide-hydrALAZINE  1 tablet Oral BID  . sodium chloride  3 mL Intravenous Q12H  . Warfarin - Pharmacist Dosing Inpatient   Does not apply q1800    Assessment: 81yo male presents from SNF for dehydration, family reports that pt has had confusion> pharmacy has been consulted to dose Coumadin for Afib during admission; CHA2DS2VASc=6, current INR therapeutic at 2.32. PTA dose 5 mg daily except 2.5 mg on M/W/F.   Goal of Therapy:  INR 2-3 Monitor platelets by anticoagulation protocol: Yes   Plan:  Coumadin 2.5 mg x 1 tonight Daily PT/INR   Hildred Laser, Pharm D 09/07/2014 11:08 AM

## 2014-09-07 NOTE — Progress Notes (Addendum)
Physical Therapy Treatment Patient Details Name: Kurt Thomas MRN: [id number] DOB: 12/16/1918 Today's Date: 09/07/2014    History of Present Illness 81 y.o. male with past medical history for coronary artery disease, history of stroke, chronic kidney disease -III, history of prostate cancer (post status of surgery, prostatectomy 20 years ago), atrial fibrillation on Coumadin, who presents with AMS, vomiting and difficult urinating.     PT Comments    Patient progressing very well with mobility. Patient ambulated in hall without physical assist. Tolerated well. Patient also able to perform in room transfers and functional tasks and hygiene post use of BSC. Patient in good spirits, anticipate patient will be able to return to ALF. Recommend HHPT upon return to ALF.  Follow Up Recommendations  Home Health PT; Supervision/Assistance - 24 hour (may return to ALF with supervision, assist)     Equipment Recommendations  None recommended by PT    Recommendations for Other Services       Precautions / Restrictions Precautions Precautions: Fall Restrictions Weight Bearing Restrictions: No    Mobility  Bed Mobility Overal bed mobility: Needs Assistance Bed Mobility: Rolling;Sidelying to Sit;Sit to Sidelying Rolling: Supervision Sidelying to sit: Min assist     Sit to sidelying: Supervision    Transfers Overall transfer level: Needs assistance Equipment used: Rolling walker (2 wheeled);1 person hand held assist Transfers: Sit to/from Stand Sit to Stand: Min guard         General transfer comment: Patient required no physical assist to elevate to standing x3  Ambulation/Gait Ambulation/Gait assistance: Supervision Ambulation Distance (Feet): 90 Feet Assistive device: Rolling walker (2 wheeled) Gait Pattern/deviations: Step-through pattern         Stairs            Wheelchair Mobility    Modified Rankin (Stroke Patients Only)       Balance Overall balance  assessment: Needs assistance Sitting-balance support: Feet supported Sitting balance-Leahy Scale: Fair     Standing balance support: During functional activity Standing balance-Leahy Scale: Poor                      Cognition Arousal/Alertness: Awake/alert Behavior During Therapy: WFL for tasks assessed/performed Overall Cognitive Status: No family/caregiver present to determine baseline cognitive functioning                      Exercises      General Comments        Pertinent Vitals/Pain Pain Assessment: No/denies pain    Home Living                      Prior Function            PT Goals (current goals can now be found in the care plan section) Acute Rehab PT Goals Patient Stated Goal: to lay down PT Goal Formulation: With patient Time For Goal Achievement: 09/18/14 Potential to Achieve Goals: Fair Progress towards PT goals: Progressing toward goals    Frequency  Min 3X/week    PT Plan Current plan remains appropriate    Co-evaluation             End of Session Equipment Utilized During Treatment: Gait belt Activity Tolerance: Patient tolerated treatment well;Patient limited by fatigue Patient left: in bed;with call bell/phone within reach     Time: 1106-1131 PT Time Calculation (min) (ACUTE ONLY): 25 min  Charges:  $Gait Training: 8-22 mins $Self Care/Home Management: 8-22  G CodesDuncan Dull 09-25-14, 1:00 PM Alben Deeds, [place] DPT  [phone]

## 2014-09-07 NOTE — Progress Notes (Signed)
[facility] PROGRESS NOTE  Kurt Thomas OIZ:[id number] DOB: August 29, 1918 DOA: 09/03/2014 PCP:  Melinda Crutch, MD  Assessment/Plan: 1. Altered mental status-likely due to dehydration, patient has underlying history of dementia. Discussed with patient's daughter who says that patient's mental status definitely has improved. All the lab work is negative for any infectious process. UA is clear chest x-ray shows no pneumonia. 2. NSVT- pateint having the episodes of NSVT, bigeminy. Cardiology was consulted, and no plans for further workup. Cartilage is recommended palliative care discussion. We will discontinue telemetry 3. A KI on C KD- patient came with mild a KI, received IV fluids and today BUN/creatinine is 21/1.46 4. Renal mass- ultrasound of kidney showed complex solid-appearing mass from the upper pole left kidney concerning for potential renal neoplasm. Called and discussed with patient's daughter [name], patient will need CT abdomen with contrast or MRI for further delineation of the mass. Currently patient is dehydrated, will give IV fluids. Further workup can be completed as outpatient. Daughter agrees with the plan 5. Dysuria- patient's UA did not show any abnormality. 6. Vomiting- no more episodes of vomiting, speech evaluation showed possible esophageal dysphagia, esophagram ordered. 7. Urine culture growing morphotypes. Patient on no antibiotics 8. Bilateral edema of the lower extremities- duplex of the lower extremities is negative for DVT 9. Atrial fibrillation-CHA2DS2-VASc Score is 6, need oral anticoagulation. Patient is on coumadin at home. INR is 2.03. Continue coumadin per pharmacy. 10. CAD- Continue Coreg, bidil  Code Status: Full code Family Communication: * Palliative care goals of care done with family and at this time they would like to continue current medical management. Disposition Plan: *Will get physical therapy evaluation, possible discharge to assisted living in  a.m.   Consultants:  None  Procedures:  *None  Antibiotics:  None  HPI/Subjective: 81 year old male with a history of CAD, CVA, C KD, history of prostate cancer A. fib on Coumadin now came with altered mental status vomiting and difficulty urinating. Patient received IV fluids and his mental status has improved. Renal ultrasound done  showed renal mass.  Patient denies any complaints.    Objective: Filed Vitals:   09/07/14 1237  BP: 137/68  Pulse: 63  Temp: 97.6 F (36.4 C)  Resp: 18    Intake/Output Summary (Last 24 hours) at 09/07/14 1255 Last data filed at 09/07/14 1240  Gross per 24 hour  Intake    697 ml  Output    890 ml  Net   -193 ml   Filed Weights   09/05/14 0357 09/06/14 0313 09/07/14 0400  Weight: 73.2 kg (161 lb 6 oz) 80.2 kg (176 lb 12.9 oz) 78.9 kg (173 lb 15.1 oz)    Exam:   General:  Appears in no acute distress  Cardiovascular: S1-S2 regular  Respiratory: Clear to auscultation bilaterally, no wheezing or crackles auscultated.  Abdomen: Soft, nontender no organomegaly  Musculoskeletal: Edema noted in the left lower extremity more than right   Data Reviewed: Basic Metabolic Panel:  Recent Labs Lab 09/03/14 1827 09/04/14 0342 09/05/14 0423  NA 137 137 137  K 4.6 4.1 3.7  CL 103 108 107  CO2 25 23 25   GLUCOSE 126* 116* 112*  BUN 27* 24* 21  CREATININE 1.64* 1.39* 1.46*  CALCIUM 9.4 8.5 8.7  MG  --   --  1.8   Liver Function Tests:  Recent Labs Lab 09/04/14 0342  AST 14  ALT 14  ALKPHOS 90  BILITOT 0.5  PROT 5.8*  ALBUMIN 3.1*  No results for input(s): LIPASE, AMYLASE in the last 168 hours. No results for input(s): AMMONIA in the last 168 hours. CBC:  Recent Labs Lab 09/03/14 1827 09/04/14 0342  WBC 7.5 7.0  HGB 10.8* 9.7*  HCT 34.0* 30.9*  MCV 94.7 94.8  PLT 219 169   Cardiac Enzymes: No results for input(s): CKTOTAL, CKMB, CKMBINDEX, TROPONINI in the last 168 hours. BNP (last 3 results) No results  for input(s): BNP in the last 8760 hours.  ProBNP (last 3 results) No results for input(s): PROBNP in the last 8760 hours.  CBG:  Recent Labs Lab 09/04/14 0851 09/05/14 0819 09/06/14 0953 09/07/14 0749  GLUCAP 110* 127* 118* 131*    Recent Results (from the past 240 hour(s))  Urine culture     Status: None   Collection Time: 09/03/14  8:25 PM  Result Value Ref Range Status   Specimen Description URINE, CLEAN CATCH  Final   Special Requests NONE  Final   Colony Count   Final    >=100,000 COLONIES/ML Performed at [facility]    Culture   Final    Multiple bacterial morphotypes present, none predominant. Suggest appropriate recollection if clinically indicated. Performed at [facility]    Report Status 09/05/2014 FINAL  Final  MRSA PCR Screening     Status: None   Collection Time: 09/04/14  7:12 AM  Result Value Ref Range Status   MRSA by PCR NEGATIVE NEGATIVE Final    Comment:        The GeneXpert MRSA Assay (FDA approved for NASAL specimens only), is one component of a comprehensive MRSA colonization surveillance program. It is not intended to diagnose MRSA infection nor to guide or monitor treatment for MRSA infections.      Studies: Dg Esophagus  09/06/2014   CLINICAL DATA:  Question of aspiration.  Rales on physical exam.  EXAM: ESOPHOGRAM/BARIUM SWALLOW  TECHNIQUE: Single contrast examination was performed using  thin barium liquid.  FLUOROSCOPY TIME:  0.9 seconds  COMPARISON:  Chest x-ray 06/20/2014  FINDINGS: The patient was positioned in 45 degrees of reversed Trendelenburg for evaluation of swelling. After or approximately 5 swallows there was frank aspiration followed by cough. A small amount of barium was aspirated into the right mainstem bronchus. No evidence for stricture. A barium tablet was not administered.  IMPRESSION: Pilar Plate aspiration of thin barium liquid.  Aspiration triggers Cough.   Electronically Signed   By: Nolon Nations  M.D.   On: 09/06/2014 13:07    Scheduled Meds: . amLODipine  10 mg Oral Daily  . carvedilol  12.5 mg Oral BID WC  . docusate sodium  100 mg Oral QHS  . donepezil  10 mg Oral QHS  . DULoxetine  20 mg Oral Daily  . famotidine  20 mg Oral Daily  . feeding supplement (ENSURE COMPLETE)  237 mL Oral BID BM  . ferrous fumarate  1 tablet Oral BID  . isosorbide-hydrALAZINE  1 tablet Oral BID  . sodium chloride  3 mL Intravenous Q12H  . warfarin  2.5 mg Oral ONCE-1800  . Warfarin - Pharmacist Dosing Inpatient   Does not apply q1800   Continuous Infusions:    Principal Problem:   Acute encephalopathy Active Problems:   Low back pain   Atrial fibrillation   Essential hypertension   CKD (chronic kidney disease) stage 3, GFR 30-59 ml/min   CAD (coronary artery disease)   Vomiting   Difficulty urinating   Protein-calorie malnutrition, severe  PVC's (premature ventricular contractions)    Time spent: 25 minutes   [facility] Pager [phone]. If 7PM-7AM, please contact night-coverage at [email or web address], password [facility] 09/07/2014, 12:55 PM  LOS: 4 days

## 2014-09-07 NOTE — Progress Notes (Signed)
Transfer report received from [place] at 1140 and pt arrived to the unit at 1235. Pt oriented to the unit and call light, vitals taken. Foley intact and unclamped upon arrival to the unit; pain assessed, skin intact with no pressure ulcer noted. pt in bed with call light within reach. Will continue to monitor quietly. Francis Gaines Abygail Galeno RN.

## 2014-09-07 NOTE — Consult Note (Signed)
Palliative Medicine Team at [facility]  Date: 09/07/2014   Patient Name: Kurt Thomas  DOB: Oct 11, 1918  MRN: [id number]  Age / Sex: 81 y.o., male   PCP: Melinda Crutch, MD Referring Physician: Oswald Hillock, MD  Active Problems: Principal Problem:   Acute encephalopathy Active Problems:   Low back pain   Atrial fibrillation   Essential hypertension   CKD (chronic kidney disease) stage 3, GFR 30-59 ml/min   CAD (coronary artery disease)   Vomiting   Difficulty urinating   Protein-calorie malnutrition, severe   PVC's (premature ventricular contractions)   HPI/Reason for Consultation: Kurt Thomas is a 81 y.o. male  Who is seen for Goals of Care consult.  Kurt Thomas is a 81 y.o. male with past medical history for coronary artery disease, history of stroke, chronic kidney disease -III, history of prostate cancer (post status of surgery, prostatectomy 20 years ago), atrial fibrillation on Coumadin, who presents with AMS, vomiting and difficult urinating.  Per patient's son-in-law, patient has difficulty urinating and dysuria in the last week. He does not have fever or chills. He seems to be more confused than his baseline. Patient also has vomiting without nausea, diarrhea or abdominal pain. his vomiting always happens in the morning. Not very sure whether he has difficulty swallowing. He lost some body weight, not sure much weight he lost exactly.  Patient denies fever, chills, cough, chest pain, SOB, abdominal pain, diarrhea, skin rashes or leg swelling. No unilateral weakness, numbness or tingling sensations. No vision change or hearing loss.  In ED, patient was found to have Elevated blood pressure at 212/128, negative urinalysis, negative troponin, stable renal function, CT brain w/o acute abnormalities, but he has old infarction over right MCA territory.EKG showed A. Fib.  Patient was admitted to inpatient for further evaluation and treatment. During this hospital stay he has seen cardiology for  NSVT/A. Fib and is stable on medical therapy w/o need for further intervention. His delerium has improved. He has been found to have a renal mass, possibly malignant.      Participants in Discussion: HCPOA: no   Advance Directive:    Code Status Orders        Start     Ordered   09/04/14 0015  Full code   Continuous     09/04/14 0014    Advance Directive Documentation        Most Recent Value   Type of Advance Directive  Healthcare Power of Attorney   Pre-existing out of facility DNR order (yellow form or pink MOST form)     "MOST" Form in Place?         '@ADVDIR'$ @  I have reviewed the medical record, interviewed the patient and family, and examined the patient. The following aspects are pertinent.  Past Medical History  Diagnosis Date  . High blood pressure   . Memory loss   . Low back pain MRI Oct '15: DDD L3-4, L4-5 with bilateral foraminal narrowing and narrow canal with L3, L4 nerve root involvement.   . Stroke   . Atrial fibrillation   . Prostate cancer   . CAD (coronary artery disease)    History   Social History  . Marital Status: Married 65 years (married at 27, she was 57). Wife lives with daughter, has dementia       . Number of Children: 1  . Years of Education: [employer], Physical education   Occupational History  .  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 Smoking status: Former Smoker -- 1.00 packs/day for 1 years    Types: Cigarettes    Quit date: 07/10/1956  . Smokeless tobacco: Never Used  . Alcohol Use: No  . Drug Use: No  . Sexual Activity: Not on file   Other Topics Concern  . None   Social History Narrative   Patient is retired and Lives in [place] . Daughter Education officer, community) lives near-by. She raises horses. He moved here from [place] to be near family. Wife lives with daughter. He reports that  his grandson was killed in a MVA at 32, he has one grand-daughter.   Education college - physical education major. He is a religious man and feels prepared to meet his Maker. He had previously made plans for funeral and cremation - now reconsidering burial.   Right handed.   Family History  Problem Relation Age of Onset  . High blood pressure    . Cancer Father    Scheduled Meds: . amLODipine  10 mg Oral Daily  . carvedilol  12.5 mg Oral BID WC  . docusate sodium  100 mg Oral QHS  . donepezil  10 mg Oral QHS  . DULoxetine  20 mg Oral Daily  . famotidine  20 mg Oral Daily  . feeding supplement (ENSURE COMPLETE)  237 mL Oral BID BM  . ferrous fumarate  1 tablet Oral BID  . isosorbide-hydrALAZINE  1 tablet Oral BID  . sodium chloride  3 mL Intravenous Q12H  . Warfarin - Pharmacist Dosing Inpatient   Does not apply q1800   Continuous Infusions:  PRN Meds:.acetaminophen **OR** acetaminophen, albuterol, HYDROcodone-acetaminophen, ondansetron, RESOURCE THICKENUP CLEAR, sodium chloride, traMADol No Known Allergies CBC:    Component Value Date/Time   WBC 7.0 09/04/2014 0342   HGB 9.7* 09/04/2014 0342   HCT 30.9* 09/04/2014 0342   PLT 169 09/04/2014 0342   MCV 94.8 09/04/2014 0342   NEUTROABS 6.0 06/21/2014 0505   LYMPHSABS 1.2 06/21/2014 0505   MONOABS 1.1* 06/21/2014 0505   EOSABS 0.2 06/21/2014 0505   BASOSABS 0.0 06/21/2014 0505   Comprehensive Metabolic Panel:    Component Value Date/Time   NA 137 09/05/2014 0423   K 3.7 09/05/2014 0423   CL 107 09/05/2014 0423   CO2 25 09/05/2014 0423   BUN 21 09/05/2014 0423   CREATININE 1.46* 09/05/2014 0423   GLUCOSE 112* 09/05/2014 0423   CALCIUM 8.7 09/05/2014 0423   AST 14 09/04/2014 0342   ALT 14 09/04/2014 0342   ALKPHOS 90 09/04/2014 0342   BILITOT 0.5 09/04/2014 0342   PROT 5.8* 09/04/2014 0342   ALBUMIN 3.1* 09/04/2014 0342   U/A 09/03/14 Negative Urine culture 09/03/14 - multiple species - non definitive   Imaging:     Barium Swallow 09/06/14: IMPRESSION:  Kurt Thomas aspiration of thin barium liquid. Aspiration triggers Cough.   CT brain 09/03/14: IMPRESSION: 1. No evidence of acute intracranial abnormality. 2. Multifocal prior infarcts in the right MCA distribution, right cerebellum, right brainstem on left basal ganglia. Atrophy and chronic small vessel ischemic change.   Renal U/S 09/04/14: IMPRESSION: Complex predominantly solid-appearing mass arising from the upper pole left kidney. This finding is concerning for potential renal neoplasm. Further evaluation with pre and post contrast MRI or CT should be considered. MRI is preferred in younger patients (due to lack of ionizing radiation) and for  evaluating calcified lesion(s).  Kidneys are echogenic, a finding that may be seen with medical renal disease. No hydronephrosis on either side.   Vital Signs: BP 158/94 mmHg  Pulse 99  Temp(Src) 98 F (36.7 C) (Oral)  Resp 21  Wt 78.9 kg (173 lb 15.1 oz)  SpO2 97% Filed Weights   09/05/14 0357 09/06/14 0313 09/07/14 0400  Weight: 73.2 kg (161 lb 6 oz) 80.2 kg (176 lb 12.9 oz) 78.9 kg (173 lb 15.1 oz)   02/28 0701 - 02/29 0700 In: 460 [P.O.:457; I.V.:3] Out: 850 [Urine:850]  Physical Exam:  General Appearance: Alert, oriented, no acute distress HEENT: Nose Nares normal. Septum midline. Mucosa normal. No drainage or sinus tenderness., no discharge Lungs: negative findings: chest symmetric with normal A/P diameter, no chest deformities noted, normal respiratory rate and rhythm CV: 2+ radial pulse, no JVD, irregular irregular rhythm, no murmur, feet warm Abdomen:soft, non-tender; bowel sounds normal; no masses,  no organomegaly Extremities:No deformities, mild pedal edema - left greater than right Skin:Skin color, texture, turgor normal. No rashes or lesions Psych: awake, no evidence of depression, repeats himself but has good remote memory. No signs of anxiety. Neuro: impaired due to poor memory.  Cognitive testing not done.   Summary of Established Goals of Care and Medical Treatment Preferences  Primary Diagnoses  1. Acute mental status changes - delerium, improved 2. Cardiac - chronic atrial fibrillation with controlled rate, on coumadin 3. Renal - mass left kidney upper pole that is suspicious 4. Aspiration - chronic, able to eat with aspiration precautions. 5. Advanced age Active Symptoms: 1. Comfortable without active symptoms of pain or discomfort.  Psycho-social/Spiritual: No evidence of anxiety. He does not wish to think about serious health issues or end of life issues.   Prognosis: Very poor with advanced age, possible RCC     Recommendations:  1. Code Status: Currently full code. Met with patient and family to address code status. Provided DNR/MOST forms and reviewed. They will take this to Dr. Melinda Crutch for further discussion 2. Scope of Treatment:  Full scope of treatment at this time 3. Symptom Management: stable 4. Palliative Prophylaxis: Needs being met at this time 5. Disposition: Return to [facility]   Time In: 0917 Time Out: 12:24 Time Total: 3: 07 Greater than 50%  of this time was spent counseling and coordinating care related to the above assessment and plan.  Signed by: Adella Hare, MD  Neena Rhymes, MD  09/07/2014, 9:22 AM  Please contact Palliative Medicine Team phone at [phone] for questions and concerns.

## 2014-09-07 NOTE — Evaluation (Signed)
Occupational Therapy Evaluation Patient Details Name: Kurt Thomas MRN: [id number] DOB: 1919-05-30 Today's Date: 09/07/2014    History of Present Illness 81 y.o. male with past medical history for coronary artery disease, history of stroke, chronic kidney disease -III, history of prostate cancer (post status of surgery, prostatectomy 20 years ago), atrial fibrillation on Coumadin, who presents with AMS, vomiting and difficult urinating.    Clinical Impression   Pt admitted with above. He demonstrates the below listed deficits and will benefit from continued OT to maximize safety and independence with BADLs.  Pt presents to OT with generalized weakness.  DOE 3/4 with audible wheeze with exertion.  He requires min A for BADLs, and min guard assist with BADLs.  Feel he can discharge back to ALF with [facility].  Will follow acutely       Follow Up Recommendations  Home health OT; return to ALF   Equipment Recommendations  None recommended by OT    Recommendations for Other Services       Precautions / Restrictions Precautions Precautions: Fall Restrictions Weight Bearing Restrictions: No      Mobility Bed Mobility Overal bed mobility: Needs Assistance Bed Mobility: Supine to Sit;Sit to Supine     Supine to sit: Min assist Sit to supine: Min guard   General bed mobility comments: Min A to lift trunk from bed   Transfers Overall transfer level: Needs assistance Equipment used: Rolling walker (2 wheeled);1 person hand held assist Transfers: Sit to/from [facility] Sit to Stand: Min guard Stand pivot transfers: Min guard       General transfer comment: min guard assist for balance and safety     Balance Overall balance assessment: Needs assistance Sitting-balance support: Feet supported Sitting balance-Leahy Scale: Good     Standing balance support: During functional activity Standing balance-Leahy Scale: Fair                               ADL Overall ADL's : Needs assistance/impaired Eating/Feeding: Supervision/ safety;Sitting;Bed level   Grooming: Wash/dry hands;Wash/dry face;Oral care;Min guard;Standing Grooming Details (indicate cue type and reason): min verbal cues for sequencing and thoroughness Upper Body Bathing: Supervision/ safety;Sitting   Lower Body Bathing: Minimal assistance;Sit to/from stand Lower Body Bathing Details (indicate cue type and reason): min A due to fatigue Upper Body Dressing : Supervision/safety;Set up;Sitting   Lower Body Dressing: Minimal assistance;Sit to/from stand Lower Body Dressing Details (indicate cue type and reason): due to fatigue.  He is able to don/doff socks  Toilet Transfer: Min guard;Ambulation;Comfort height toilet   Toileting- Clothing Manipulation and Hygiene: Min guard;Sit to/from stand       Functional mobility during ADLs: Min guard;Rolling walker General ADL Comments: Pt with DOE 3/4 and audible wheeze during ADL activities.      Vision     Perception     Praxis      Pertinent Vitals/Pain       Hand Dominance Right   Extremity/Trunk Assessment Upper Extremity Assessment Upper Extremity Assessment: Generalized weakness   Lower Extremity Assessment Lower Extremity Assessment: Defer to PT evaluation   Cervical / Trunk Assessment Cervical / Trunk Assessment: Kyphotic   Communication Communication Communication: HOH   Cognition Arousal/Alertness: Awake/alert Behavior During Therapy: Flat affect Overall Cognitive Status: No family/caregiver present to determine baseline cognitive functioning       Memory: Decreased short-term memory             General  Comments       Exercises       Shoulder Instructions      Home Living Family/patient expects to be discharged to:: Assisted living                                 Additional Comments: patient was at [facility] for rehabilitation after hemi-hip arthroplasty in  December, completed [facility] and discharged to [facility].       Prior Functioning/Environment Level of Independence: Needs assistance  Gait / Transfers Assistance Needed:  (used RW and had 1 person assist per patient) ADL's / Homemaking Assistance Needed: Pt reports he was able to perform BADLs mod I (no family present to confirm this)        OT Diagnosis: Generalized weakness;Cognitive deficits   OT Problem List: Decreased strength;Decreased activity tolerance;Impaired balance (sitting and/or standing);Decreased cognition;Decreased safety awareness;Decreased knowledge of use of DME or AE   OT Treatment/Interventions: Self-care/ADL training;Energy conservation;DME and/or AE instruction;Therapeutic activities;Patient/family education;Balance training;Cognitive remediation/compensation    OT Goals(Current goals can be found in the care plan section) Acute Rehab OT Goals Patient Stated Goal: to go back to sleep  OT Goal Formulation: With patient Time For Goal Achievement: 09/21/14 Potential to Achieve Goals: Fair ADL Goals Pt Will Perform Grooming: with supervision;standing Pt Will Perform Lower Body Bathing: with supervision;sit to/from stand Pt Will Perform Lower Body Dressing: with supervision;sit to/from stand Pt Will Transfer to Toilet: with supervision;ambulating;regular height toilet;grab bars Pt Will Perform Toileting - Clothing Manipulation and hygiene: with supervision;sit to/from stand  OT Frequency: Min 2X/week   Barriers to D/C: Decreased caregiver support          Co-evaluation              End of Session Equipment Utilized During Treatment: Rolling walker Nurse Communication: Mobility status  Activity Tolerance: Patient limited by fatigue Patient left: in bed;with call bell/phone within reach;with bed alarm set   Time: 2119-4174 OT Time Calculation (min): 22 min Charges:  OT General Charges $OT Visit: 1 Procedure OT Evaluation $Initial OT  Evaluation Tier I: 1 Procedure G-Codes:    Lucille Passy M 09/08/14, 3:56 PM

## 2014-09-07 NOTE — Procedures (Cosign Needed)
Objective Swallowing Evaluation: Modified Barium Swallowing Study  Patient Details  Name: Kurt Thomas MRN: [id number] Date of Birth: 12-Mar-1919  Today's Date: 09/07/2014 Time: SLP Start Time (ACUTE ONLY): 1335-SLP Stop Time (ACUTE ONLY): 1350 SLP Time Calculation (min) (ACUTE ONLY): 15 min  Past Medical History:  Past Medical History  Diagnosis Date  . High blood pressure   . Memory loss   . Low back pain   . Stroke   . Atrial fibrillation   . Prostate cancer   . CAD (coronary artery disease)    Past Surgical History:  Past Surgical History  Procedure Laterality Date  . Coronary artery bypass graft    . Prostatectomy    . Cataract extraction    . Hip arthroplasty Left 06/22/2014    Procedure: LEFT HEMIARTHROPLASTY HIP;  Surgeon: Meredith Pel, MD;  Location: [facility];  Service: Orthopedics;  Laterality: Left;   HPI:  HPI: Jerrold Haskell is a 81 y.o. male with past medical history for coronary artery disease, history of stroke, chronic kidney disease -III, history of prostate cancer (post status of surgery, prostatectomy 20 years ago), atrial fibrillation on Coumadin, who presents with AMS, vomiting and difficult urinating. Per patient's son-in-law, patient has difficulty urinating and dysuria in the last week. He does not have fever or chills. He seems to be more confused than his baseline. Patient also has vomiting without nausea, diarrhea or abdominal pain. his vomiting always happens in the morning. Pt had esophagram that showed sensed aspiration of thin liquids to bronchus.  No Data Recorded  Assessment / Plan / Recommendation CHL IP CLINICAL IMPRESSIONS 09/07/2014  Dysphagia Diagnosis Mild pharyngeal phase dysphagia  Clinical impression Pt presents with mild pharynageal phase dysphagia characterized by delayed swallow initiation to the level of the valleculae. Noted premature spillage to the level of the valleculae and pyriforms when given large sips of thin liquids. Pt observed to  have hypertensive UES with trace backflow of cervical esophageal residual to pyriform sinus. Pt also with appearance of mid-esophageal stasis with tablet, no radiologist present to confirm. No aspiration or penetration was observed. Recommend pt initiate regular/ thin liquid diet. SLP discussed esophageal precautions with pt. Education complete, no f/u needed.       CHL IP TREATMENT RECOMMENDATION 09/07/2014  Treatment Plan Recommendations Therapy as outlined in treatment plan below     CHL IP DIET RECOMMENDATION 09/07/2014  Diet Recommendations Regular;Thin liquid  Liquid Administration via Cup;Straw  Medication Administration Whole meds with liquid  Compensations Slow rate;Small sips/bites  Postural Changes and/or Swallow Maneuvers Seated upright 90 degrees;Upright 30-60 min after meal     CHL IP OTHER RECOMMENDATIONS 09/07/2014  Recommended Consults (None)  Oral Care Recommendations Oral care BID  Other Recommendations (None)     CHL IP FOLLOW UP RECOMMENDATIONS 09/04/2014  Follow up Recommendations None     CHL IP FREQUENCY AND DURATION 09/07/2014  Speech Therapy Frequency (ACUTE ONLY) min 2x/week  Treatment Duration 1 week     Pertinent Vitals/Pain NA    SLP Swallow Goals No flowsheet data found.  No flowsheet data found.    CHL IP REASON FOR REFERRAL 09/07/2014  Reason for Referral Objectively evaluate swallowing function     CHL IP ORAL PHASE 09/07/2014  Lips (None)  Tongue (None)  Mucous membranes (None)  Nutritional status (None)  Other (None)  Oxygen therapy (None)  Oral Phase WFL  Oral - Pudding Teaspoon (None)  Oral - Pudding Cup (None)  Oral - Honey Teaspoon (None)  Oral - Honey Cup (None)  Oral - Honey Syringe (None)  Oral - Nectar Teaspoon (None)  Oral - Nectar Cup (None)  Oral - Nectar Straw (None)  Oral - Nectar Syringe (None)  Oral - Ice Chips (None)  Oral - Thin Teaspoon (None)  Oral - Thin Cup (None)  Oral - Thin Straw (None)  Oral - Thin Syringe  (None)  Oral - Puree (None)  Oral - Mechanical Soft (None)  Oral - Regular (None)  Oral - Multi-consistency (None)  Oral - Pill (None)  Oral Phase - Comment (None)      CHL IP PHARYNGEAL PHASE 09/07/2014  Pharyngeal Phase Impaired  Pharyngeal - Pudding Teaspoon (None)  Penetration/Aspiration details (pudding teaspoon) (None)  Pharyngeal - Pudding Cup (None)  Penetration/Aspiration details (pudding cup) (None)  Pharyngeal - Honey Teaspoon (None)  Penetration/Aspiration details (honey teaspoon) (None)  Pharyngeal - Honey Cup (None)  Penetration/Aspiration details (honey cup) (None)  Pharyngeal - Honey Syringe (None)  Penetration/Aspiration details (honey syringe) (None)  Pharyngeal - Nectar Teaspoon (None)  Penetration/Aspiration details (nectar teaspoon) (None)  Pharyngeal - Nectar Cup (None)  Penetration/Aspiration details (nectar cup) (None)  Pharyngeal - Nectar Straw (None)  Penetration/Aspiration details (nectar straw) (None)  Pharyngeal - Nectar Syringe (None)  Penetration/Aspiration details (nectar syringe) (None)  Pharyngeal - Ice Chips (None)  Penetration/Aspiration details (ice chips) (None)  Pharyngeal - Thin Teaspoon (None)  Penetration/Aspiration details (thin teaspoon) (None)  Pharyngeal - Thin Cup Delayed swallow initiation;Premature spillage to valleculae;Premature spillage to pyriform sinuses  Penetration/Aspiration details (thin cup) (None)  Pharyngeal - Thin Straw Delayed swallow initiation;Premature spillage to valleculae;Premature spillage to pyriform sinuses  Penetration/Aspiration details (thin straw) (None)  Pharyngeal - Thin Syringe (None)  Penetration/Aspiration details (thin syringe') (None)  Pharyngeal - Puree Delayed swallow initiation  Penetration/Aspiration details (puree) (None)  Pharyngeal - Mechanical Soft (None)  Penetration/Aspiration details (mechanical soft) (None)  Pharyngeal - Regular Delayed swallow initiation  Penetration/Aspiration  details (regular) (None)  Pharyngeal - Multi-consistency (None)  Penetration/Aspiration details (multi-consistency) (None)  Pharyngeal - Pill Delayed swallow initiation  Penetration/Aspiration details (pill) (None)  Pharyngeal Comment (None)     CHL IP CERVICAL ESOPHAGEAL PHASE 09/07/2014  Cervical Esophageal Phase Impaired  Pudding Teaspoon (None)  Pudding Cup (None)  Honey Teaspoon (None)  Honey Cup (None)  Honey Syringe (None)  Nectar Teaspoon (None)  Nectar Cup (None)  Nectar Straw (None)  Nectar Syringe (None)  Thin Teaspoon (None)  Thin Cup Cervical esophageal backflow into the larynx;Reduced cricopharyngeal relaxation  Thin Straw Cervical Esophageal backflow into the larynx;Reduced cricopharyngeal relaxation  Thin Syringe (None)  Cervical Esophageal Comment (None)    CHL IP GO 09/07/2014  Functional Assessment Tool Used Skilled observation  Functional Limitations (None)  Swallow Current Status (F7494) (None)  Swallow Goal Status (W9675) (None)  Swallow Discharge Status (F1638) (None)  Motor Speech Current Status [phone]) (None)  Motor Speech Goal Status (D3570) (None)  Motor Speech Goal Status (V7793) (None)  Spoken Language Comprehension Current Status (J0300) (None)  Spoken Language Comprehension Goal Status (P2330) (None)  Spoken Language Comprehension Discharge Status [phone]) (None)  Spoken Language Expression Current Status [phone]) (None)  Spoken Language Expression Goal Status (K5625) (None)  Spoken Language Expression Discharge Status [phone]) (None)  Attention Current Status (T3428) (None)  Attention Goal Status (J6811) (None)  Attention Discharge Status (X7262) (None)  Memory Current Status (M3559) (None)  Memory Goal Status (R4163) (None)  Memory Discharge Status (A4536) (None)  Voice Current Status (I6803) (None)  Voice Goal Status (O1224) (None)  Voice Discharge Status [phone]) (None)  Other Speech-Language Pathology Functional Limitation [phone]) (None)   Other Speech-Language Pathology Functional Limitation Goal Status (N1657) (None)  Other Speech-Language Pathology Functional Limitation Discharge Status [phone]) (None)    Rodena Goldmann, SLP student       Supervised and reviewed by Herbie Baltimore MA CCC-SLP  Daphney Hopke, Katherene Ponto 09/07/2014, 2:55 PM

## 2014-09-07 NOTE — Clinical Social Work Psychosocial (Signed)
Clinical Social Work Department BRIEF PSYCHOSOCIAL ASSESSMENT 09/07/2014  Patient:  Kurt Thomas, Kurt Thomas     Account Number:  [id number]     Admit date:  09/03/2014  Clinical Social Worker:  Domenica Reamer, Sharon  Date/Time:  09/07/2014 03:54 PM  Referred by:  Physician  Date Referred:  09/07/2014 Referred for  ALF Placement   Other Referral:   Interview type:  Family Other interview type:    PSYCHOSOCIAL DATA Living Status:  FACILITY Admitted from facility:  [facility] Level of care:  Assisted Living Primary support name:  Wallie Renshaw Primary support relationship to patient:  CHILD, ADULT Degree of support available:   high level of support    CURRENT CONCERNS Current Concerns  Post-Acute Placement   Other Concerns:    SOCIAL WORK ASSESSMENT / PLAN CSW spoke with patients dtr, Kurt Thomas, concerning pt return to [facility]- pt dtr is agreeable to pt returning to [facility] and states that the pt loves it at the facility and his main motivation is to return to his friends at the facility.    CSW discussed possbility of [facility] being unable to accept patient back without pt going to rehab first or getting some additional supervision at the facility- patients daughter is agreeable to providing some more supervision at the facility or if necessary patient going to SNF- pt has been to [facility] in the past and would be agreeable to returning if necessary.    CSW will continue to follow   Assessment/plan status:  Psychosocial Support/Ongoing Assessment of Needs Other assessment/ plan:   FL2 update   Information/referral to community resources:    PATIENT'S/FAMILY'S RESPONSE TO PLAN OF CARE: Patients dtr is hopeful that patient can return to [facility] at time of DC- dtr states that she knows that the patient is near the end of life and just wants him to be happy.       Domenica Reamer, [place] Social  Worker [phone]

## 2014-09-07 NOTE — Progress Notes (Signed)
OT Cancellation Note  Patient Details Name: Kurt Thomas MRN: [id number] DOB: 1918/07/16   Cancelled Treatment:    Reason Eval/Treat Not Completed: Patient at procedure or test/ unavailable - Will reattempt.  Darlina Rumpf [place], OTR/L [phone]  09/07/2014, 2:02 PM

## 2014-09-08 DIAGNOSIS — R1112 Projectile vomiting: Secondary | ICD-10-CM

## 2014-09-08 DIAGNOSIS — N183 Chronic kidney disease, stage 3 (moderate): Secondary | ICD-10-CM

## 2014-09-08 DIAGNOSIS — G9340 Encephalopathy, unspecified: Principal | ICD-10-CM

## 2014-09-08 LAB — PROTIME-INR
INR: 2.45 — ABNORMAL HIGH (ref 0.00–1.49)
Prothrombin Time: 26.8 seconds — ABNORMAL HIGH (ref 11.6–15.2)

## 2014-09-08 LAB — GLUCOSE, CAPILLARY: Glucose-Capillary: 105 mg/dL — ABNORMAL HIGH (ref 70–99)

## 2014-09-08 LAB — CLOSTRIDIUM DIFFICILE BY PCR: Toxigenic C. Difficile by PCR: NEGATIVE

## 2014-09-08 MED ORDER — DOCUSATE SODIUM 100 MG PO CAPS: 100.0000 mg | ORAL_CAPSULE | Freq: Every day | ORAL | Status: DC | PRN

## 2014-09-08 MED ORDER — HYDROCODONE-ACETAMINOPHEN 5-325 MG PO TABS: 1.0000 | ORAL_TABLET | Freq: Four times a day (QID) | ORAL | Status: DC | PRN

## 2014-09-08 MED ORDER — ENSURE COMPLETE PO LIQD: 237.0000 mL | Freq: Two times a day (BID) | ORAL | Status: DC

## 2014-09-08 MED ORDER — AMLODIPINE BESYLATE 10 MG PO TABS: 10.0000 mg | ORAL_TABLET | Freq: Every day | ORAL | Status: AC

## 2014-09-08 NOTE — Progress Notes (Signed)
Nsg Discharge Note  Admit Date:  09/03/2014 Discharge date: 09/08/2014   Kurt Thomas to be D/C'd Skilled nursing facility per MD order.  AVS completed.  Copy for chart, and copy for patient signed, and dated. Patient/caregiver able to verbalize understanding.  Discharge Medication:   Medication List    STOP taking these medications        isosorbide dinitrate 20 MG tablet  Commonly known as:  ISORDIL      TAKE these medications        albuterol (2.5 MG/3ML) 0.083% nebulizer solution  Commonly known as:  PROVENTIL  Take 2.5 mg by nebulization every 6 (six) hours as needed for wheezing or shortness of breath.     amLODipine 10 MG tablet  Commonly known as:  NORVASC  Take 1 tablet (10 mg total) by mouth daily.     carvedilol 12.5 MG tablet  Commonly known as:  COREG  Take 12.5 mg by mouth 2 (two) times daily with a meal.     docusate sodium 100 MG capsule  Commonly known as:  COLACE  Take 1 capsule (100 mg total) by mouth daily as needed for mild constipation.     donepezil 10 MG tablet  Commonly known as:  ARICEPT  Take 10 mg by mouth at bedtime.     DULoxetine 20 MG capsule  Commonly known as:  CYMBALTA  Take 1 capsule (20 mg total) by mouth daily.     feeding supplement (ENSURE COMPLETE) Liqd  Take 237 mLs by mouth 2 (two) times daily between meals.     Ferrous Fumarate 150 MG Tabs  Take 1 tablet (150 mg total) by mouth 2 (two) times daily.     hydrALAZINE 25 MG tablet  Commonly known as:  APRESOLINE  Take 50 mg by mouth 2 (two) times daily.     HYDROcodone-acetaminophen 5-325 MG per tablet  Commonly known as:  NORCO/VICODIN  Take 1 tablet by mouth every 6 (six) hours as needed for severe pain.     isosorbide-hydrALAZINE 20-37.5 MG per tablet  Commonly known as:  BIDIL  Take 1 tablet by mouth 2 (two) times daily.     polyethylene glycol packet  Commonly known as:  MIRALAX / GLYCOLAX  Take 17 g by mouth 2 (two) times daily.     ranitidine 150 MG capsule   Commonly known as:  ZANTAC  Take 150 mg by mouth 2 (two) times daily.     warfarin 5 MG tablet  Commonly known as:  COUMADIN  Take 1.5 tablets (7.5 mg total) by mouth daily. To be taken for the next 2 days; then will require INR level and further coumadin adjustments.        Discharge Assessment: Filed Vitals:   09/08/14 0905  BP: 142/96  Pulse:   Temp:   Resp:    Skin clean, dry and intact without evidence of skin break down, no evidence of skin tears noted. His bottom does have some moisture associated dermatitis; but otherwise intact.  IV catheter discontinued intact. Site without signs and symptoms of complications - no redness or edema noted at insertion site, patient denies c/o pain - only slight tenderness at site.  Dressing with slight pressure applied.  D/c Instructions-Education: Discharge instructions given to patient/family with verbalized understanding. D/c education completed with patient/family including follow up instructions, medication list, d/c activities limitations if indicated, with other d/c instructions as indicated by MD - patient able to verbalize understanding, all questions fully answered. Patient  instructed to return to ED, call 911, or call MD for any changes in condition.  Patient escorted via [place], and D/C home via private auto.  Jessamine Barcia Margaretha Sheffield, RN 09/08/2014 2:03 PM

## 2014-09-08 NOTE — Progress Notes (Signed)
NCM spoke with [facility] and they stated the hhpt/hhot is listed on the fl2 so I do not need to send orders to them, just send the patient.

## 2014-09-08 NOTE — Care Management Note (Addendum)
    Page 1 of 1   09/08/2014     3:40:37 PM CARE MANAGEMENT NOTE 09/08/2014  Patient:  Kurt Thomas, Kurt Thomas   Account Number:  [id number]  Date Initiated:  09/07/2014  Documentation initiated by:  Tomi Bamberger  Subjective/Objective Assessment:   dx seizure  admit- from [facility].     Action/Plan:   pt eval-hhpt/hhot   Anticipated DC Date:  09/08/2014   Anticipated DC Plan:  [place] referral  Clinical Social Worker      DC Forensic scientist  CM consult      [facility] Choice  HOME HEALTH   Choice offered to / List presented to:          [place] arranged  HH-2 PT  HH-3 OT      [place] agency  OTHER - SEE NOTE   Status of service:  Completed, signed off Medicare Important Message given?  YES (If response is "NO", the following Medicare IM given date fields will be blank) Date Medicare IM given:  09/08/2014 Medicare IM given by:  Tomi Bamberger Date Additional Medicare IM given:   Additional Medicare IM given by:    Discharge Disposition:  [place]  Per UR Regulation:  Reviewed for med. necessity/level of care/duration of stay  If discussed at [place] of Stay Meetings, dates discussed:    Comments:  09/08/14 [place], BSN [phone] patient for dc to [facility] today, will need hhpt/hhot, which is documented on fl2.  [place] will proide hhpt/hhot on site.

## 2014-09-08 NOTE — Discharge Summary (Signed)
Physician Discharge Summary  Kurt Thomas IHK:[id number] DOB: 1919-01-19 DOA: 09/03/2014  PCP:  Melinda Crutch, MD  Admit date: 09/03/2014 Discharge date: 09/08/2014  Time spent: 25* minutes  Recommendations for Outpatient Follow-up:  1. *Follow up PCP in 2 weeks 2.  Discharge Diagnoses:  Principal Problem:   Acute encephalopathy Active Problems:   Low back pain   Atrial fibrillation   Essential hypertension   CKD (chronic kidney disease) stage 3, GFR 30-59 ml/min   CAD (coronary artery disease)   Vomiting   Difficulty urinating   Protein-calorie malnutrition, severe   PVC's (premature ventricular contractions)   Discharge Condition: Stable  Diet recommendation: Regular diet with thin liquid  Filed Weights   09/05/14 0357 09/06/14 0313 09/07/14 0400  Weight: 73.2 kg (161 lb 6 oz) 80.2 kg (176 lb 12.9 oz) 78.9 kg (173 lb 15.1 oz)    History of present illness:  81 year old male with a history of CAD, CVA, C KD, history of prostate cancer A. fib on Coumadin now came with altered mental status vomiting and difficulty urinating. Patient received IV fluids and his mental status has improved. Renal ultrasound done showed renal mass.  Hospital Course:  1. Altered mental status- Resolved likely due to dehydration, patient has underlying history of dementia. Discussed with patient's daughter who says that patient's mental status definitely has improved. All the lab work is negative for any infectious process. UA is clear chest x-ray shows no pneumonia. 2. NSVT- pateint having the episodes of NSVT, bigeminy. Cardiology was consulted, and no plans for further workup. Cartilage is recommended palliative care discussion. Telemetry was discontinued. 3. AKI on CKD- patient came with mild a KI, received IV fluids and today BUN/creatinine is 21/1.46 4. Renal mass- ultrasound of kidney showed complex solid-appearing mass from the upper pole left kidney concerning for potential renal neoplasm. Called  and discussed with patient's daughter Kurt Thomas, patient will need CT abdomen with contrast or MRI for further delineation of the mass. Currently patient is dehydrated, will give IV fluids. Further workup can be completed as outpatient. Daughter agrees with the plan. Palliative care was consulted and plan is to continue with current care. 5. Dysuria- patient's UA did not show any abnormality. 6. Vomiting- no more episodes of vomiting, speech evaluation showed possible esophageal dysphagia, esophagram ordered, which showed  Frank aspiration of thin barium liquid. Aspiration triggers Cough. Speech was re consulted and at this time speech recommends Regular diet with thin liquids. 7. Urine culture growing morphotypes. Patient on no antibiotics 8. Bilateral edema of the lower extremities- duplex of the lower extremities is negative for DVT 9. Atrial fibrillation-CHA2DS2-VASc Score is 6, need oral anticoagulation. Patient is on coumadin at home. INR is 2.03. Continue coumadin per pharmacy. 10. CAD- Continue Coreg, bidil  Procedures:  Esophagram  Renal ultrasound  Consultations:  Palliative care    Discharge Exam: Filed Vitals:   09/08/14 0905  BP: 142/96  Pulse:   Temp:   Resp:     General: [place] in no acute distress Cardiovascular: S1s2 RRR Respiratory: Clear bilaterally  Discharge Instructions   Discharge Instructions    Diet - low sodium heart healthy    Complete by:  As directed      Increase activity slowly    Complete by:  As directed           Current Discharge Medication List    START taking these medications   Details  amLODipine (NORVASC) 10 MG tablet Take 1 tablet (10 mg total)  by mouth daily. Qty: 30 tablet, Refills: 2    feeding supplement, ENSURE COMPLETE, (ENSURE COMPLETE) LIQD Take 237 mLs by mouth 2 (two) times daily between meals. Qty: 10 Bottle, Refills: 0      CONTINUE these medications which have CHANGED   Details  docusate sodium  (COLACE) 100 MG capsule Take 1 capsule (100 mg total) by mouth daily as needed for mild constipation. Qty: 30 capsule, Refills: 0    HYDROcodone-acetaminophen (NORCO/VICODIN) 5-325 MG per tablet Take 1-2 tablets by mouth every 6 (six) hours as needed for severe pain. Qty: 30 tablet, Refills: 0      CONTINUE these medications which have NOT CHANGED   Details  albuterol (PROVENTIL) (2.5 MG/3ML) 0.083% nebulizer solution Take 2.5 mg by nebulization every 6 (six) hours as needed for wheezing or shortness of breath.    carvedilol (COREG) 12.5 MG tablet Take 12.5 mg by mouth 2 (two) times daily with a meal.    donepezil (ARICEPT) 10 MG tablet Take 10 mg by mouth at bedtime.    DULoxetine (CYMBALTA) 20 MG capsule Take 1 capsule (20 mg total) by mouth daily. Qty: 30 capsule, Refills: 11    Ferrous Fumarate 150 MG TABS Take 1 tablet (150 mg total) by mouth 2 (two) times daily.    hydrALAZINE (APRESOLINE) 25 MG tablet Take 50 mg by mouth 2 (two) times daily.    polyethylene glycol (MIRALAX / GLYCOLAX) packet Take 17 g by mouth 2 (two) times daily.    ranitidine (ZANTAC) 150 MG capsule Take 150 mg by mouth 2 (two) times daily.     warfarin (COUMADIN) 5 MG tablet Take 1.5 tablets (7.5 mg total) by mouth daily. To be taken for the next 2 days; then will require INR level and further coumadin adjustments.    isosorbide-hydrALAZINE (BIDIL) 20-37.5 MG per tablet Take 1 tablet by mouth 2 (two) times daily.      STOP taking these medications     isosorbide dinitrate (ISORDIL) 20 MG tablet      sodium chloride (OCEAN) 0.65 % SOLN nasal spray      traMADol (ULTRAM) 50 MG tablet        No Known Allergies    The results of significant diagnostics from this hospitalization (including imaging, microbiology, ancillary and laboratory) are listed below for reference.    Significant Diagnostic Studies: Ct Head Wo Contrast  09/03/2014   CLINICAL DATA:  81 year old male with confusion.  EXAM: CT  HEAD WITHOUT CONTRAST  TECHNIQUE: Contiguous axial images were obtained from the base of the skull through the vertex without intravenous contrast.  COMPARISON:  None.  FINDINGS: Generalized atrophy and chronic small vessel ischemia. Encephalomalacia right MCA distribution. Remote prior infarcts in right cerebellum. Small lacunar infarcts in the right brainstem and left basal ganglia. No intracranial hemorrhage, mass effect, or midline shift. No hydrocephalus. The basilar cisterns are patent. No extra-axial fluid collection. Calvarium is intact. Mucous retention cyst in the right maxillary sinus. Minimal mucosal thickening of the right frontal sinus and ethmoid air cells. The mastoid air cells are well aerated.  IMPRESSION: 1. No evidence of acute intracranial abnormality. 2. Multifocal prior infarcts in the right MCA distribution, right cerebellum, right brainstem on left basal ganglia. Atrophy and chronic small vessel ischemic change.   Electronically Signed   By: Jeb Levering M.D.   On: 09/03/2014 22:21   Dg Esophagus  09/06/2014   CLINICAL DATA:  Question of aspiration.  Rales on physical exam.  EXAM: ESOPHOGRAM/BARIUM  SWALLOW  TECHNIQUE: Single contrast examination was performed using  thin barium liquid.  FLUOROSCOPY TIME:  0.9 seconds  COMPARISON:  Chest x-ray 06/20/2014  FINDINGS: The patient was positioned in 45 degrees of reversed Trendelenburg for evaluation of swelling. After or approximately 5 swallows there was frank aspiration followed by cough. A small amount of barium was aspirated into the right mainstem bronchus. No evidence for stricture. A barium tablet was not administered.  IMPRESSION: Pilar Plate aspiration of thin barium liquid.  Aspiration triggers Cough.   Electronically Signed   By: Nolon Nations M.D.   On: 09/06/2014 13:07   US Renal  09/04/2014   CLINICAL DATA:  Stage III chronic renal disease. Dysuria. History of prostate carcinoma  EXAM: RENAL/URINARY TRACT ULTRASOUND COMPLETE   COMPARISON:  None.  FINDINGS: Right Kidney:  Length: 10.6 cm. Echogenicity is increased. Renal cortical thickness is normal. No mass, perinephric fluid, or hydronephrosis visualized. No sonographically demonstrable calculus or ureterectasis.  Left Kidney:  Length: 10.9 cm. Echogenicity is slightly increased. Renal cortical thickness is normal.  There is a complex predominantly solid mass arising from the upper pole region measuring 5.8 x 5.1 x 5.4 cm. There is a cyst in the upper pole region measuring 1.8 x 1.4 x 2.0 cm. There is a cyst in the lower pole region measuring 1.4 x 1.2 x 1.0 cm. There is no perinephric fluid or hydronephrosis. There is no sonographically demonstrable calculus or ureterectasis.  Bladder:  Appears normal for degree of bladder distention.  IMPRESSION: Complex predominantly solid-appearing mass arising from the upper pole left kidney. This finding is concerning for potential renal neoplasm. Further evaluation with pre and post contrast MRI or CT should be considered. MRI is preferred in younger patients (due to lack of ionizing radiation) and for evaluating calcified lesion(s).  Kidneys are echogenic, a finding that may be seen with medical renal disease. No hydronephrosis on either side.  These results will be called to the ordering clinician or representative by the Radiologist Assistant, and communication documented in the PACS or zVision Dashboard.   Electronically Signed   By: Lowella Grip III M.D.   On: 09/04/2014 07:28   Dg Swallowing Func-speech Pathology  09/07/2014   Katherene Ponto [place]bl[place], CCC-SLP     09/07/2014  3:04 PM  Objective Swallowing Evaluation: Modified Barium Swallowing Study   Patient Details  Name: Kurt Thomas MRN: [id number] Date of Birth: [birth year]  Today's Date: 09/07/2014 Time: SLP Start Time (ACUTE ONLY): 1335-SLP Stop Time (ACUTE  ONLY): 1350 SLP Time Calculation (min) (ACUTE ONLY): 15 min  Past Medical History:  Past Medical History  Diagnosis Date  . High  blood pressure   . Memory loss   . Low back pain   . Stroke   . Atrial fibrillation   . Prostate cancer   . CAD (coronary artery disease)    Past Surgical History:  Past Surgical History  Procedure Laterality Date  . Coronary artery bypass graft    . Prostatectomy    . Cataract extraction    . Hip arthroplasty Left 06/22/2014    Procedure: LEFT HEMIARTHROPLASTY HIP;  Surgeon: Meredith Pel, MD;  Location: [facility];  Service: Orthopedics;  Laterality:  Left;   HPI:  HPI: Ikeem Cleckler is a 81 y.o. male with past medical history for  coronary artery disease, history of stroke, chronic kidney  disease -III, history of prostate cancer (post status of surgery,  prostatectomy 20 years ago), atrial fibrillation on Coumadin, who  presents with AMS, vomiting and difficult urinating. Per  patient's son-in-law, patient has difficulty urinating and  dysuria in the last week. He does not have fever or chills. He  seems to be more confused than his baseline. Patient also has  vomiting without nausea, diarrhea or abdominal pain. his vomiting  always happens in the morning. Pt had esophagram that showed  sensed aspiration of thin liquids to bronchus.  No Data Recorded  Assessment / Plan / Recommendation CHL IP CLINICAL IMPRESSIONS 09/07/2014  Dysphagia Diagnosis Mild pharyngeal phase dysphagia  Clinical impression Pt presents with mild pharynageal phase  dysphagia characterized by delayed swallow initiation to the  level of the valleculae. Noted premature spillage to the level of  the valleculae and pyriforms when given large sips of thin  liquids. Pt observed to have hypertensive UES with trace backflow  of cervical esophageal residual to pyriform sinus. Pt also with  appearance of mid-esophageal stasis with tablet, no radiologist  present to confirm. No aspiration or penetration was observed.  Recommend pt initiate regular/ thin liquid diet. SLP discussed  esophageal precautions with pt. Education complete, no f/u  needed.        CHL IP TREATMENT RECOMMENDATION 09/07/2014  Treatment Plan Recommendations Therapy as outlined in treatment  plan below     CHL IP DIET RECOMMENDATION 09/07/2014  Diet Recommendations Regular;Thin liquid  Liquid Administration via Cup;Straw  Medication Administration Whole meds with liquid  Compensations Slow rate;Small sips/bites  Postural Changes and/or Swallow Maneuvers Seated upright 90  degrees;Upright 30-60 min after meal     CHL IP OTHER RECOMMENDATIONS 09/07/2014  Recommended Consults (None)  Oral Care Recommendations Oral care BID  Other Recommendations (None)     CHL IP FOLLOW UP RECOMMENDATIONS 09/04/2014  Follow up Recommendations None     CHL IP FREQUENCY AND DURATION 09/07/2014  Speech Therapy Frequency (ACUTE ONLY) min 2x/week  Treatment Duration 1 week     Pertinent Vitals/Pain NA    SLP Swallow Goals No flowsheet data found.  No flowsheet data found.    CHL IP REASON FOR REFERRAL 09/07/2014  Reason for Referral Objectively evaluate swallowing function     CHL IP ORAL PHASE 09/07/2014  Lips (None)  Tongue (None)  Mucous membranes (None)  Nutritional status (None)  Other (None)  Oxygen therapy (None)  Oral Phase WFL  Oral - Pudding Teaspoon (None)  Oral - Pudding Cup (None)  Oral - Honey Teaspoon (None)  Oral - Honey Cup (None)  Oral - Honey Syringe (None)  Oral - Nectar Teaspoon (None)  Oral - Nectar Cup (None)  Oral - Nectar Straw (None)  Oral - Nectar Syringe (None)  Oral - Ice Chips (None)  Oral - Thin Teaspoon (None)  Oral - Thin Cup (None)  Oral - Thin Straw (None)  Oral - Thin Syringe (None)  Oral - Puree (None)  Oral - Mechanical Soft (None)  Oral - Regular (None)  Oral - Multi-consistency (None)  Oral - Pill (None)  Oral Phase - Comment (None)      CHL IP PHARYNGEAL PHASE 09/07/2014  Pharyngeal Phase Impaired  Pharyngeal - Pudding Teaspoon (None)  Penetration/Aspiration details (pudding teaspoon) (None)  Pharyngeal - Pudding Cup (None)  Penetration/Aspiration details (pudding cup) (None)  Pharyngeal  - Honey Teaspoon (None)  Penetration/Aspiration details (honey teaspoon) (None)  Pharyngeal - Honey Cup (None)  Penetration/Aspiration details (honey cup) (None)  Pharyngeal - Honey Syringe (None)  Penetration/Aspiration details (honey syringe) (None)  Pharyngeal - Nectar Teaspoon (None)  Penetration/Aspiration  details (nectar teaspoon) (None)  Pharyngeal - Nectar Cup (None)  Penetration/Aspiration details (nectar cup) (None)  Pharyngeal - Nectar Straw (None)  Penetration/Aspiration details (nectar straw) (None)  Pharyngeal - Nectar Syringe (None)  Penetration/Aspiration details (nectar syringe) (None)  Pharyngeal - Ice Chips (None)  Penetration/Aspiration details (ice chips) (None)  Pharyngeal - Thin Teaspoon (None)  Penetration/Aspiration details (thin teaspoon) (None)  Pharyngeal - Thin Cup Delayed swallow initiation;Premature  spillage to valleculae;Premature spillage to pyriform sinuses  Penetration/Aspiration details (thin cup) (None)  Pharyngeal - Thin Straw Delayed swallow initiation;Premature  spillage to valleculae;Premature spillage to pyriform sinuses  Penetration/Aspiration details (thin straw) (None)  Pharyngeal - Thin Syringe (None)  Penetration/Aspiration details (thin syringe') (None)  Pharyngeal - Puree Delayed swallow initiation  Penetration/Aspiration details (puree) (None)  Pharyngeal - Mechanical Soft (None)  Penetration/Aspiration details (mechanical soft) (None)  Pharyngeal - Regular Delayed swallow initiation  Penetration/Aspiration details (regular) (None)  Pharyngeal - Multi-consistency (None)  Penetration/Aspiration details (multi-consistency) (None)  Pharyngeal - Pill Delayed swallow initiation  Penetration/Aspiration details (pill) (None)  Pharyngeal Comment (None)     CHL IP CERVICAL ESOPHAGEAL PHASE 09/07/2014  Cervical Esophageal Phase Impaired  Pudding Teaspoon (None)  Pudding Cup (None)  Honey Teaspoon (None)  Honey Cup (None)  Honey Syringe (None)  Nectar Teaspoon (None)  Nectar  Cup (None)  Nectar Straw (None)  Nectar Syringe (None)  Thin Teaspoon (None)  Thin Cup Cervical esophageal backflow into the larynx;Reduced  cricopharyngeal relaxation  Thin Straw Cervical Esophageal backflow into the larynx;Reduced  cricopharyngeal relaxation  Thin Syringe (None)  Cervical Esophageal Comment (None)    CHL IP GO 09/07/2014  Functional Assessment Tool Used Skilled observation  Functional Limitations (None)  Swallow Current Status (F0932) (None)  Swallow Goal Status (T5573) (None)  Swallow Discharge Status (U2025) (None)  Motor Speech Current Status [phone]) (None)  Motor Speech Goal Status (C3762) (None)  Motor Speech Goal Status (G3151) (None)  Spoken Language Comprehension Current Status (V6160) (None)  Spoken Language Comprehension Goal Status (V3710) (None)  Spoken Language Comprehension Discharge Status [phone]) (None)  Spoken Language Expression Current Status [phone]) (None)  Spoken Language Expression Goal Status [phone]) (None)  Spoken Language Expression Discharge Status [phone] (None)  Attention Current Status (W2993) (None)  Attention Goal Status (Z1696) (None)  Attention Discharge Status [phone]) (None)  Memory Current Status (B0175) (None)  Memory Goal Status (Z0258) (None)  Memory Discharge Status (N2778) (None)  Voice Current Status (E4235) (None)  Voice Goal Status (T6144) (None)  Voice Discharge Status [phone]) (None)  Other Speech-Language Pathology Functional Limitation [phone])  (None)  Other Speech-Language Pathology Functional Limitation Goal Status  (P9509) (None)  Other Speech-Language Pathology Functional Limitation Discharge  Status [phone]) (None)    Rodena Goldmann, SLP student       Supervised and reviewed by Herbie Baltimore MA CCC-SLP  DeBlois, Katherene Ponto 09/07/2014, 2:55 PM     Microbiology: Recent Results (from the past 240 hour(s))  Urine culture     Status: None   Collection Time: 09/03/14  8:25 PM  Result Value Ref Range Status   Specimen Description URINE,  CLEAN CATCH  Final   Special Requests NONE  Final   Colony Count   Final    >=100,000 COLONIES/ML Performed at [facility]    Culture   Final    Multiple bacterial morphotypes present, none predominant. Suggest appropriate recollection if clinically indicated. Performed at [facility]    Report Status 09/05/2014 FINAL  Final  MRSA PCR Screening  Status: None   Collection Time: 09/04/14  7:12 AM  Result Value Ref Range Status   MRSA by PCR NEGATIVE NEGATIVE Final    Comment:        The GeneXpert MRSA Assay (FDA approved for NASAL specimens only), is one component of a comprehensive MRSA colonization surveillance program. It is not intended to diagnose MRSA infection nor to guide or monitor treatment for MRSA infections.      Labs: Basic Metabolic Panel:  Recent Labs Lab 09/03/14 1827 09/04/14 0342 09/05/14 0423  NA 137 137 137  K 4.6 4.1 3.7  CL 103 108 107  CO2 25 23 25   GLUCOSE 126* 116* 112*  BUN 27* 24* 21  CREATININE 1.64* 1.39* 1.46*  CALCIUM 9.4 8.5 8.7  MG  --   --  1.8   Liver Function Tests:  Recent Labs Lab 09/04/14 0342  AST 14  ALT 14  ALKPHOS 90  BILITOT 0.5  PROT 5.8*  ALBUMIN 3.1*   No results for input(s): LIPASE, AMYLASE in the last 168 hours. No results for input(s): AMMONIA in the last 168 hours. CBC:  Recent Labs Lab 09/03/14 1827 09/04/14 0342  WBC 7.5 7.0  HGB 10.8* 9.7*  HCT 34.0* 30.9*  MCV 94.7 94.8  PLT 219 169   Cardiac Enzymes: No results for input(s): CKTOTAL, CKMB, CKMBINDEX, TROPONINI in the last 168 hours. BNP: BNP (last 3 results) No results for input(s): BNP in the last 8760 hours.  ProBNP (last 3 results) No results for input(s): PROBNP in the last 8760 hours.  CBG:  Recent Labs Lab 09/04/14 0851 09/05/14 0819 09/06/14 0953 09/07/14 0749 09/08/14 0745  GLUCAP 110* 127* 118* 131* 105*       Signed:  Ramona Slinger S  [facility] 09/08/2014, 10:18 AM

## 2014-09-08 NOTE — Clinical Social Work Note (Signed)
Per MD patient ready to DC back to [facility]. Priscilla with facility states they can accept patient back at this time. RN, patient/family (daughter Jackelyn Poling), and facility notified of patient's DC. RN given number for report. DC packet on patient's chart. Patient's daughter will transport him back to ALF. CSW signing off at this time.   Liz Beach MSW, [place], [place], [phone]

## 2014-09-09 DIAGNOSIS — M6281 Muscle weakness (generalized): Secondary | ICD-10-CM | POA: Diagnosis not present

## 2014-09-09 DIAGNOSIS — R262 Difficulty in walking, not elsewhere classified: Secondary | ICD-10-CM | POA: Diagnosis not present

## 2014-09-09 DIAGNOSIS — R1312 Dysphagia, oropharyngeal phase: Secondary | ICD-10-CM | POA: Diagnosis not present

## 2014-09-09 DIAGNOSIS — R279 Unspecified lack of coordination: Secondary | ICD-10-CM | POA: Diagnosis not present

## 2014-09-14 DIAGNOSIS — R0981 Nasal congestion: Secondary | ICD-10-CM | POA: Diagnosis not present

## 2014-09-14 DIAGNOSIS — I4891 Unspecified atrial fibrillation: Secondary | ICD-10-CM | POA: Diagnosis not present

## 2014-09-14 DIAGNOSIS — Z7901 Long term (current) use of anticoagulants: Secondary | ICD-10-CM | POA: Diagnosis not present

## 2014-09-14 DIAGNOSIS — D649 Anemia, unspecified: Secondary | ICD-10-CM | POA: Diagnosis not present

## 2014-09-22 DIAGNOSIS — I482 Chronic atrial fibrillation: Secondary | ICD-10-CM | POA: Diagnosis not present

## 2014-09-22 DIAGNOSIS — Z7901 Long term (current) use of anticoagulants: Secondary | ICD-10-CM | POA: Diagnosis not present

## 2014-09-25 DIAGNOSIS — Z7901 Long term (current) use of anticoagulants: Secondary | ICD-10-CM | POA: Diagnosis not present

## 2014-09-25 DIAGNOSIS — I482 Chronic atrial fibrillation: Secondary | ICD-10-CM | POA: Diagnosis not present

## 2014-09-29 DIAGNOSIS — Z7901 Long term (current) use of anticoagulants: Secondary | ICD-10-CM | POA: Diagnosis not present

## 2014-10-04 DIAGNOSIS — J209 Acute bronchitis, unspecified: Secondary | ICD-10-CM | POA: Diagnosis not present

## 2014-10-10 DIAGNOSIS — R062 Wheezing: Secondary | ICD-10-CM | POA: Diagnosis not present

## 2014-10-12 DIAGNOSIS — R1312 Dysphagia, oropharyngeal phase: Secondary | ICD-10-CM | POA: Diagnosis not present

## 2014-10-14 DIAGNOSIS — Z7901 Long term (current) use of anticoagulants: Secondary | ICD-10-CM | POA: Diagnosis not present

## 2014-10-14 DIAGNOSIS — R1312 Dysphagia, oropharyngeal phase: Secondary | ICD-10-CM | POA: Diagnosis not present

## 2014-10-14 DIAGNOSIS — R634 Abnormal weight loss: Secondary | ICD-10-CM | POA: Diagnosis not present

## 2014-10-14 DIAGNOSIS — I482 Chronic atrial fibrillation: Secondary | ICD-10-CM | POA: Diagnosis not present

## 2014-10-29 DIAGNOSIS — R451 Restlessness and agitation: Secondary | ICD-10-CM | POA: Diagnosis not present

## 2014-10-29 DIAGNOSIS — I482 Chronic atrial fibrillation: Secondary | ICD-10-CM | POA: Diagnosis not present

## 2014-10-29 DIAGNOSIS — Z7901 Long term (current) use of anticoagulants: Secondary | ICD-10-CM | POA: Diagnosis not present

## 2014-11-17 DIAGNOSIS — Z7901 Long term (current) use of anticoagulants: Secondary | ICD-10-CM | POA: Diagnosis not present

## 2014-11-17 DIAGNOSIS — R350 Frequency of micturition: Secondary | ICD-10-CM | POA: Diagnosis not present

## 2014-11-17 DIAGNOSIS — I4891 Unspecified atrial fibrillation: Secondary | ICD-10-CM | POA: Diagnosis not present

## 2014-12-10 DIAGNOSIS — Z7901 Long term (current) use of anticoagulants: Secondary | ICD-10-CM | POA: Diagnosis not present

## 2014-12-10 DIAGNOSIS — I482 Chronic atrial fibrillation: Secondary | ICD-10-CM | POA: Diagnosis not present

## 2015-01-21 DIAGNOSIS — N390 Urinary tract infection, site not specified: Secondary | ICD-10-CM | POA: Diagnosis not present

## 2015-01-25 DIAGNOSIS — I482 Chronic atrial fibrillation: Secondary | ICD-10-CM | POA: Diagnosis not present

## 2015-01-25 DIAGNOSIS — Z7901 Long term (current) use of anticoagulants: Secondary | ICD-10-CM | POA: Diagnosis not present

## 2015-02-02 DIAGNOSIS — Z7901 Long term (current) use of anticoagulants: Secondary | ICD-10-CM | POA: Diagnosis not present

## 2015-02-02 DIAGNOSIS — I482 Chronic atrial fibrillation: Secondary | ICD-10-CM | POA: Diagnosis not present

## 2015-02-10 DIAGNOSIS — N390 Urinary tract infection, site not specified: Secondary | ICD-10-CM | POA: Diagnosis not present

## 2015-02-18 DIAGNOSIS — Z23 Encounter for immunization: Secondary | ICD-10-CM | POA: Diagnosis not present

## 2015-02-18 DIAGNOSIS — Z7901 Long term (current) use of anticoagulants: Secondary | ICD-10-CM | POA: Diagnosis not present

## 2015-02-18 DIAGNOSIS — I482 Chronic atrial fibrillation: Secondary | ICD-10-CM | POA: Diagnosis not present

## 2015-02-24 DIAGNOSIS — H349 Unspecified retinal vascular occlusion: Secondary | ICD-10-CM | POA: Diagnosis not present

## 2015-02-24 DIAGNOSIS — H52203 Unspecified astigmatism, bilateral: Secondary | ICD-10-CM | POA: Diagnosis not present

## 2015-02-24 DIAGNOSIS — H4011X4 Primary open-angle glaucoma, indeterminate stage: Secondary | ICD-10-CM | POA: Diagnosis not present

## 2015-02-24 DIAGNOSIS — Z961 Presence of intraocular lens: Secondary | ICD-10-CM | POA: Diagnosis not present

## 2015-03-03 DIAGNOSIS — M25562 Pain in left knee: Secondary | ICD-10-CM | POA: Diagnosis not present

## 2015-03-03 DIAGNOSIS — M25552 Pain in left hip: Secondary | ICD-10-CM | POA: Diagnosis not present

## 2015-03-03 DIAGNOSIS — R2689 Other abnormalities of gait and mobility: Secondary | ICD-10-CM | POA: Diagnosis not present

## 2015-03-03 DIAGNOSIS — M6281 Muscle weakness (generalized): Secondary | ICD-10-CM | POA: Diagnosis not present

## 2015-03-04 DIAGNOSIS — R2689 Other abnormalities of gait and mobility: Secondary | ICD-10-CM | POA: Diagnosis not present

## 2015-03-04 DIAGNOSIS — M25552 Pain in left hip: Secondary | ICD-10-CM | POA: Diagnosis not present

## 2015-03-04 DIAGNOSIS — M25562 Pain in left knee: Secondary | ICD-10-CM | POA: Diagnosis not present

## 2015-03-04 DIAGNOSIS — M6281 Muscle weakness (generalized): Secondary | ICD-10-CM | POA: Diagnosis not present

## 2015-03-08 DIAGNOSIS — M25562 Pain in left knee: Secondary | ICD-10-CM | POA: Diagnosis not present

## 2015-03-08 DIAGNOSIS — R2689 Other abnormalities of gait and mobility: Secondary | ICD-10-CM | POA: Diagnosis not present

## 2015-03-08 DIAGNOSIS — M6281 Muscle weakness (generalized): Secondary | ICD-10-CM | POA: Diagnosis not present

## 2015-03-08 DIAGNOSIS — M25552 Pain in left hip: Secondary | ICD-10-CM | POA: Diagnosis not present

## 2015-03-09 DIAGNOSIS — M25562 Pain in left knee: Secondary | ICD-10-CM | POA: Diagnosis not present

## 2015-03-09 DIAGNOSIS — M25552 Pain in left hip: Secondary | ICD-10-CM | POA: Diagnosis not present

## 2015-03-09 DIAGNOSIS — I4891 Unspecified atrial fibrillation: Secondary | ICD-10-CM | POA: Diagnosis not present

## 2015-03-09 DIAGNOSIS — Z7901 Long term (current) use of anticoagulants: Secondary | ICD-10-CM | POA: Diagnosis not present

## 2015-03-09 DIAGNOSIS — M6281 Muscle weakness (generalized): Secondary | ICD-10-CM | POA: Diagnosis not present

## 2015-03-09 DIAGNOSIS — R2689 Other abnormalities of gait and mobility: Secondary | ICD-10-CM | POA: Diagnosis not present

## 2015-03-12 DIAGNOSIS — R2689 Other abnormalities of gait and mobility: Secondary | ICD-10-CM | POA: Diagnosis not present

## 2015-03-12 DIAGNOSIS — M6281 Muscle weakness (generalized): Secondary | ICD-10-CM | POA: Diagnosis not present

## 2015-03-12 DIAGNOSIS — M25562 Pain in left knee: Secondary | ICD-10-CM | POA: Diagnosis not present

## 2015-03-12 DIAGNOSIS — M25552 Pain in left hip: Secondary | ICD-10-CM | POA: Diagnosis not present

## 2015-03-16 DIAGNOSIS — H34832 Tributary (branch) retinal vein occlusion, left eye: Secondary | ICD-10-CM | POA: Diagnosis not present

## 2015-03-16 DIAGNOSIS — H43813 Vitreous degeneration, bilateral: Secondary | ICD-10-CM | POA: Diagnosis not present

## 2015-03-17 DIAGNOSIS — M6281 Muscle weakness (generalized): Secondary | ICD-10-CM | POA: Diagnosis not present

## 2015-03-17 DIAGNOSIS — M25552 Pain in left hip: Secondary | ICD-10-CM | POA: Diagnosis not present

## 2015-03-17 DIAGNOSIS — R2689 Other abnormalities of gait and mobility: Secondary | ICD-10-CM | POA: Diagnosis not present

## 2015-03-17 DIAGNOSIS — M25562 Pain in left knee: Secondary | ICD-10-CM | POA: Diagnosis not present

## 2015-03-19 DIAGNOSIS — M25562 Pain in left knee: Secondary | ICD-10-CM | POA: Diagnosis not present

## 2015-03-19 DIAGNOSIS — R2689 Other abnormalities of gait and mobility: Secondary | ICD-10-CM | POA: Diagnosis not present

## 2015-03-19 DIAGNOSIS — M25552 Pain in left hip: Secondary | ICD-10-CM | POA: Diagnosis not present

## 2015-03-19 DIAGNOSIS — M6281 Muscle weakness (generalized): Secondary | ICD-10-CM | POA: Diagnosis not present

## 2015-03-22 DIAGNOSIS — M25552 Pain in left hip: Secondary | ICD-10-CM | POA: Diagnosis not present

## 2015-03-22 DIAGNOSIS — M6281 Muscle weakness (generalized): Secondary | ICD-10-CM | POA: Diagnosis not present

## 2015-03-22 DIAGNOSIS — R2689 Other abnormalities of gait and mobility: Secondary | ICD-10-CM | POA: Diagnosis not present

## 2015-03-22 DIAGNOSIS — M25562 Pain in left knee: Secondary | ICD-10-CM | POA: Diagnosis not present

## 2015-03-23 DIAGNOSIS — M25562 Pain in left knee: Secondary | ICD-10-CM | POA: Diagnosis not present

## 2015-03-23 DIAGNOSIS — R2689 Other abnormalities of gait and mobility: Secondary | ICD-10-CM | POA: Diagnosis not present

## 2015-03-23 DIAGNOSIS — R770 Abnormality of albumin: Secondary | ICD-10-CM | POA: Diagnosis not present

## 2015-03-23 DIAGNOSIS — M6281 Muscle weakness (generalized): Secondary | ICD-10-CM | POA: Diagnosis not present

## 2015-03-23 DIAGNOSIS — M25552 Pain in left hip: Secondary | ICD-10-CM | POA: Diagnosis not present

## 2015-03-25 DIAGNOSIS — R2689 Other abnormalities of gait and mobility: Secondary | ICD-10-CM | POA: Diagnosis not present

## 2015-03-25 DIAGNOSIS — M25552 Pain in left hip: Secondary | ICD-10-CM | POA: Diagnosis not present

## 2015-03-25 DIAGNOSIS — M6281 Muscle weakness (generalized): Secondary | ICD-10-CM | POA: Diagnosis not present

## 2015-03-25 DIAGNOSIS — M25562 Pain in left knee: Secondary | ICD-10-CM | POA: Diagnosis not present

## 2015-03-29 DIAGNOSIS — R2689 Other abnormalities of gait and mobility: Secondary | ICD-10-CM | POA: Diagnosis not present

## 2015-03-29 DIAGNOSIS — M6281 Muscle weakness (generalized): Secondary | ICD-10-CM | POA: Diagnosis not present

## 2015-03-29 DIAGNOSIS — M25552 Pain in left hip: Secondary | ICD-10-CM | POA: Diagnosis not present

## 2015-03-29 DIAGNOSIS — M25562 Pain in left knee: Secondary | ICD-10-CM | POA: Diagnosis not present

## 2015-03-30 DIAGNOSIS — M25562 Pain in left knee: Secondary | ICD-10-CM | POA: Diagnosis not present

## 2015-03-30 DIAGNOSIS — M6281 Muscle weakness (generalized): Secondary | ICD-10-CM | POA: Diagnosis not present

## 2015-03-30 DIAGNOSIS — R2689 Other abnormalities of gait and mobility: Secondary | ICD-10-CM | POA: Diagnosis not present

## 2015-03-30 DIAGNOSIS — M25552 Pain in left hip: Secondary | ICD-10-CM | POA: Diagnosis not present

## 2015-03-31 DIAGNOSIS — Z7901 Long term (current) use of anticoagulants: Secondary | ICD-10-CM | POA: Diagnosis not present

## 2015-03-31 DIAGNOSIS — I482 Chronic atrial fibrillation: Secondary | ICD-10-CM | POA: Diagnosis not present

## 2015-04-01 DIAGNOSIS — M25562 Pain in left knee: Secondary | ICD-10-CM | POA: Diagnosis not present

## 2015-04-01 DIAGNOSIS — R2689 Other abnormalities of gait and mobility: Secondary | ICD-10-CM | POA: Diagnosis not present

## 2015-04-01 DIAGNOSIS — M6281 Muscle weakness (generalized): Secondary | ICD-10-CM | POA: Diagnosis not present

## 2015-04-01 DIAGNOSIS — M25552 Pain in left hip: Secondary | ICD-10-CM | POA: Diagnosis not present

## 2015-04-05 DIAGNOSIS — M6281 Muscle weakness (generalized): Secondary | ICD-10-CM | POA: Diagnosis not present

## 2015-04-05 DIAGNOSIS — R2689 Other abnormalities of gait and mobility: Secondary | ICD-10-CM | POA: Diagnosis not present

## 2015-04-05 DIAGNOSIS — M25552 Pain in left hip: Secondary | ICD-10-CM | POA: Diagnosis not present

## 2015-04-05 DIAGNOSIS — M25562 Pain in left knee: Secondary | ICD-10-CM | POA: Diagnosis not present

## 2015-04-06 DIAGNOSIS — M6281 Muscle weakness (generalized): Secondary | ICD-10-CM | POA: Diagnosis not present

## 2015-04-06 DIAGNOSIS — M25562 Pain in left knee: Secondary | ICD-10-CM | POA: Diagnosis not present

## 2015-04-06 DIAGNOSIS — R2689 Other abnormalities of gait and mobility: Secondary | ICD-10-CM | POA: Diagnosis not present

## 2015-04-06 DIAGNOSIS — M25552 Pain in left hip: Secondary | ICD-10-CM | POA: Diagnosis not present

## 2015-04-12 DIAGNOSIS — R2689 Other abnormalities of gait and mobility: Secondary | ICD-10-CM | POA: Diagnosis not present

## 2015-04-12 DIAGNOSIS — M25562 Pain in left knee: Secondary | ICD-10-CM | POA: Diagnosis not present

## 2015-04-12 DIAGNOSIS — M6281 Muscle weakness (generalized): Secondary | ICD-10-CM | POA: Diagnosis not present

## 2015-04-12 DIAGNOSIS — M25552 Pain in left hip: Secondary | ICD-10-CM | POA: Diagnosis not present

## 2015-04-13 DIAGNOSIS — R300 Dysuria: Secondary | ICD-10-CM | POA: Diagnosis not present

## 2015-04-13 DIAGNOSIS — M6281 Muscle weakness (generalized): Secondary | ICD-10-CM | POA: Diagnosis not present

## 2015-04-13 DIAGNOSIS — R2689 Other abnormalities of gait and mobility: Secondary | ICD-10-CM | POA: Diagnosis not present

## 2015-04-13 DIAGNOSIS — M25552 Pain in left hip: Secondary | ICD-10-CM | POA: Diagnosis not present

## 2015-04-13 DIAGNOSIS — M25562 Pain in left knee: Secondary | ICD-10-CM | POA: Diagnosis not present

## 2015-04-13 DIAGNOSIS — N3281 Overactive bladder: Secondary | ICD-10-CM | POA: Diagnosis not present

## 2015-04-20 DIAGNOSIS — Z7901 Long term (current) use of anticoagulants: Secondary | ICD-10-CM | POA: Diagnosis not present

## 2015-04-20 DIAGNOSIS — I482 Chronic atrial fibrillation: Secondary | ICD-10-CM | POA: Diagnosis not present

## 2015-04-27 DIAGNOSIS — Z7901 Long term (current) use of anticoagulants: Secondary | ICD-10-CM | POA: Diagnosis not present

## 2015-04-29 DIAGNOSIS — N390 Urinary tract infection, site not specified: Secondary | ICD-10-CM | POA: Diagnosis not present

## 2015-05-04 DIAGNOSIS — H34832 Tributary (branch) retinal vein occlusion, left eye, with macular edema: Secondary | ICD-10-CM | POA: Diagnosis not present

## 2015-05-05 DIAGNOSIS — H401133 Primary open-angle glaucoma, bilateral, severe stage: Secondary | ICD-10-CM | POA: Diagnosis not present

## 2015-05-18 DIAGNOSIS — N3281 Overactive bladder: Secondary | ICD-10-CM | POA: Diagnosis not present

## 2015-05-19 DIAGNOSIS — I4891 Unspecified atrial fibrillation: Secondary | ICD-10-CM | POA: Diagnosis not present

## 2015-05-19 DIAGNOSIS — Z7901 Long term (current) use of anticoagulants: Secondary | ICD-10-CM | POA: Diagnosis not present

## 2015-05-21 DIAGNOSIS — R488 Other symbolic dysfunctions: Secondary | ICD-10-CM | POA: Diagnosis not present

## 2015-05-21 DIAGNOSIS — R2681 Unsteadiness on feet: Secondary | ICD-10-CM | POA: Diagnosis not present

## 2015-05-21 DIAGNOSIS — M6281 Muscle weakness (generalized): Secondary | ICD-10-CM | POA: Diagnosis not present

## 2015-05-24 DIAGNOSIS — R2681 Unsteadiness on feet: Secondary | ICD-10-CM | POA: Diagnosis not present

## 2015-05-24 DIAGNOSIS — M6281 Muscle weakness (generalized): Secondary | ICD-10-CM | POA: Diagnosis not present

## 2015-05-24 DIAGNOSIS — R488 Other symbolic dysfunctions: Secondary | ICD-10-CM | POA: Diagnosis not present

## 2015-05-25 DIAGNOSIS — M6281 Muscle weakness (generalized): Secondary | ICD-10-CM | POA: Diagnosis not present

## 2015-05-25 DIAGNOSIS — R2681 Unsteadiness on feet: Secondary | ICD-10-CM | POA: Diagnosis not present

## 2015-05-25 DIAGNOSIS — R488 Other symbolic dysfunctions: Secondary | ICD-10-CM | POA: Diagnosis not present

## 2015-05-26 DIAGNOSIS — R2681 Unsteadiness on feet: Secondary | ICD-10-CM | POA: Diagnosis not present

## 2015-05-26 DIAGNOSIS — R488 Other symbolic dysfunctions: Secondary | ICD-10-CM | POA: Diagnosis not present

## 2015-05-26 DIAGNOSIS — M6281 Muscle weakness (generalized): Secondary | ICD-10-CM | POA: Diagnosis not present

## 2015-05-31 DIAGNOSIS — R2681 Unsteadiness on feet: Secondary | ICD-10-CM | POA: Diagnosis not present

## 2015-05-31 DIAGNOSIS — R488 Other symbolic dysfunctions: Secondary | ICD-10-CM | POA: Diagnosis not present

## 2015-05-31 DIAGNOSIS — M6281 Muscle weakness (generalized): Secondary | ICD-10-CM | POA: Diagnosis not present

## 2015-06-01 DIAGNOSIS — R488 Other symbolic dysfunctions: Secondary | ICD-10-CM | POA: Diagnosis not present

## 2015-06-01 DIAGNOSIS — R2681 Unsteadiness on feet: Secondary | ICD-10-CM | POA: Diagnosis not present

## 2015-06-01 DIAGNOSIS — M6281 Muscle weakness (generalized): Secondary | ICD-10-CM | POA: Diagnosis not present

## 2015-06-02 DIAGNOSIS — R2681 Unsteadiness on feet: Secondary | ICD-10-CM | POA: Diagnosis not present

## 2015-06-02 DIAGNOSIS — M6281 Muscle weakness (generalized): Secondary | ICD-10-CM | POA: Diagnosis not present

## 2015-06-02 DIAGNOSIS — R488 Other symbolic dysfunctions: Secondary | ICD-10-CM | POA: Diagnosis not present

## 2015-06-08 DIAGNOSIS — Z7901 Long term (current) use of anticoagulants: Secondary | ICD-10-CM | POA: Diagnosis not present

## 2015-06-08 DIAGNOSIS — R488 Other symbolic dysfunctions: Secondary | ICD-10-CM | POA: Diagnosis not present

## 2015-06-08 DIAGNOSIS — I482 Chronic atrial fibrillation: Secondary | ICD-10-CM | POA: Diagnosis not present

## 2015-06-08 DIAGNOSIS — M6281 Muscle weakness (generalized): Secondary | ICD-10-CM | POA: Diagnosis not present

## 2015-06-08 DIAGNOSIS — R2681 Unsteadiness on feet: Secondary | ICD-10-CM | POA: Diagnosis not present

## 2015-06-09 DIAGNOSIS — R2681 Unsteadiness on feet: Secondary | ICD-10-CM | POA: Diagnosis not present

## 2015-06-09 DIAGNOSIS — R488 Other symbolic dysfunctions: Secondary | ICD-10-CM | POA: Diagnosis not present

## 2015-06-09 DIAGNOSIS — M6281 Muscle weakness (generalized): Secondary | ICD-10-CM | POA: Diagnosis not present

## 2015-06-10 DIAGNOSIS — M6281 Muscle weakness (generalized): Secondary | ICD-10-CM | POA: Diagnosis not present

## 2015-06-10 DIAGNOSIS — R488 Other symbolic dysfunctions: Secondary | ICD-10-CM | POA: Diagnosis not present

## 2015-06-10 DIAGNOSIS — R2681 Unsteadiness on feet: Secondary | ICD-10-CM | POA: Diagnosis not present

## 2015-06-14 DIAGNOSIS — M6281 Muscle weakness (generalized): Secondary | ICD-10-CM | POA: Diagnosis not present

## 2015-06-14 DIAGNOSIS — R488 Other symbolic dysfunctions: Secondary | ICD-10-CM | POA: Diagnosis not present

## 2015-06-14 DIAGNOSIS — R2681 Unsteadiness on feet: Secondary | ICD-10-CM | POA: Diagnosis not present

## 2015-06-15 DIAGNOSIS — R2681 Unsteadiness on feet: Secondary | ICD-10-CM | POA: Diagnosis not present

## 2015-06-15 DIAGNOSIS — R488 Other symbolic dysfunctions: Secondary | ICD-10-CM | POA: Diagnosis not present

## 2015-06-15 DIAGNOSIS — M6281 Muscle weakness (generalized): Secondary | ICD-10-CM | POA: Diagnosis not present

## 2015-06-17 DIAGNOSIS — M6281 Muscle weakness (generalized): Secondary | ICD-10-CM | POA: Diagnosis not present

## 2015-06-17 DIAGNOSIS — R2681 Unsteadiness on feet: Secondary | ICD-10-CM | POA: Diagnosis not present

## 2015-06-17 DIAGNOSIS — R488 Other symbolic dysfunctions: Secondary | ICD-10-CM | POA: Diagnosis not present

## 2015-06-22 DIAGNOSIS — M6281 Muscle weakness (generalized): Secondary | ICD-10-CM | POA: Diagnosis not present

## 2015-06-22 DIAGNOSIS — R488 Other symbolic dysfunctions: Secondary | ICD-10-CM | POA: Diagnosis not present

## 2015-06-22 DIAGNOSIS — R2681 Unsteadiness on feet: Secondary | ICD-10-CM | POA: Diagnosis not present

## 2015-06-23 DIAGNOSIS — R2681 Unsteadiness on feet: Secondary | ICD-10-CM | POA: Diagnosis not present

## 2015-06-23 DIAGNOSIS — R488 Other symbolic dysfunctions: Secondary | ICD-10-CM | POA: Diagnosis not present

## 2015-06-23 DIAGNOSIS — M6281 Muscle weakness (generalized): Secondary | ICD-10-CM | POA: Diagnosis not present

## 2015-06-24 DIAGNOSIS — R2681 Unsteadiness on feet: Secondary | ICD-10-CM | POA: Diagnosis not present

## 2015-06-24 DIAGNOSIS — R488 Other symbolic dysfunctions: Secondary | ICD-10-CM | POA: Diagnosis not present

## 2015-06-24 DIAGNOSIS — M6281 Muscle weakness (generalized): Secondary | ICD-10-CM | POA: Diagnosis not present

## 2015-06-25 DIAGNOSIS — R488 Other symbolic dysfunctions: Secondary | ICD-10-CM | POA: Diagnosis not present

## 2015-06-25 DIAGNOSIS — M6281 Muscle weakness (generalized): Secondary | ICD-10-CM | POA: Diagnosis not present

## 2015-06-25 DIAGNOSIS — R2681 Unsteadiness on feet: Secondary | ICD-10-CM | POA: Diagnosis not present

## 2015-06-28 DIAGNOSIS — R488 Other symbolic dysfunctions: Secondary | ICD-10-CM | POA: Diagnosis not present

## 2015-06-28 DIAGNOSIS — R2681 Unsteadiness on feet: Secondary | ICD-10-CM | POA: Diagnosis not present

## 2015-06-28 DIAGNOSIS — M6281 Muscle weakness (generalized): Secondary | ICD-10-CM | POA: Diagnosis not present

## 2015-06-29 DIAGNOSIS — R488 Other symbolic dysfunctions: Secondary | ICD-10-CM | POA: Diagnosis not present

## 2015-06-29 DIAGNOSIS — R2681 Unsteadiness on feet: Secondary | ICD-10-CM | POA: Diagnosis not present

## 2015-06-29 DIAGNOSIS — M6281 Muscle weakness (generalized): Secondary | ICD-10-CM | POA: Diagnosis not present

## 2015-06-30 DIAGNOSIS — M6281 Muscle weakness (generalized): Secondary | ICD-10-CM | POA: Diagnosis not present

## 2015-06-30 DIAGNOSIS — R488 Other symbolic dysfunctions: Secondary | ICD-10-CM | POA: Diagnosis not present

## 2015-06-30 DIAGNOSIS — R2681 Unsteadiness on feet: Secondary | ICD-10-CM | POA: Diagnosis not present

## 2015-07-05 DIAGNOSIS — M6281 Muscle weakness (generalized): Secondary | ICD-10-CM | POA: Diagnosis not present

## 2015-07-05 DIAGNOSIS — R2681 Unsteadiness on feet: Secondary | ICD-10-CM | POA: Diagnosis not present

## 2015-07-05 DIAGNOSIS — R488 Other symbolic dysfunctions: Secondary | ICD-10-CM | POA: Diagnosis not present

## 2015-07-06 DIAGNOSIS — H34832 Tributary (branch) retinal vein occlusion, left eye, with macular edema: Secondary | ICD-10-CM | POA: Diagnosis not present

## 2015-07-07 DIAGNOSIS — M6281 Muscle weakness (generalized): Secondary | ICD-10-CM | POA: Diagnosis not present

## 2015-07-07 DIAGNOSIS — R2681 Unsteadiness on feet: Secondary | ICD-10-CM | POA: Diagnosis not present

## 2015-07-07 DIAGNOSIS — J320 Chronic maxillary sinusitis: Secondary | ICD-10-CM | POA: Diagnosis not present

## 2015-07-07 DIAGNOSIS — R488 Other symbolic dysfunctions: Secondary | ICD-10-CM | POA: Diagnosis not present

## 2015-07-08 DIAGNOSIS — R488 Other symbolic dysfunctions: Secondary | ICD-10-CM | POA: Diagnosis not present

## 2015-07-08 DIAGNOSIS — R2681 Unsteadiness on feet: Secondary | ICD-10-CM | POA: Diagnosis not present

## 2015-07-08 DIAGNOSIS — M6281 Muscle weakness (generalized): Secondary | ICD-10-CM | POA: Diagnosis not present

## 2015-07-12 DIAGNOSIS — R2681 Unsteadiness on feet: Secondary | ICD-10-CM | POA: Diagnosis not present

## 2015-07-12 DIAGNOSIS — M6281 Muscle weakness (generalized): Secondary | ICD-10-CM | POA: Diagnosis not present

## 2015-07-12 DIAGNOSIS — R488 Other symbolic dysfunctions: Secondary | ICD-10-CM | POA: Diagnosis not present

## 2015-07-13 DIAGNOSIS — R2681 Unsteadiness on feet: Secondary | ICD-10-CM | POA: Diagnosis not present

## 2015-07-13 DIAGNOSIS — R488 Other symbolic dysfunctions: Secondary | ICD-10-CM | POA: Diagnosis not present

## 2015-07-13 DIAGNOSIS — H401133 Primary open-angle glaucoma, bilateral, severe stage: Secondary | ICD-10-CM | POA: Diagnosis not present

## 2015-07-13 DIAGNOSIS — M6281 Muscle weakness (generalized): Secondary | ICD-10-CM | POA: Diagnosis not present

## 2015-07-14 DIAGNOSIS — M6281 Muscle weakness (generalized): Secondary | ICD-10-CM | POA: Diagnosis not present

## 2015-07-14 DIAGNOSIS — R2681 Unsteadiness on feet: Secondary | ICD-10-CM | POA: Diagnosis not present

## 2015-07-14 DIAGNOSIS — R488 Other symbolic dysfunctions: Secondary | ICD-10-CM | POA: Diagnosis not present

## 2015-07-19 DIAGNOSIS — M6281 Muscle weakness (generalized): Secondary | ICD-10-CM | POA: Diagnosis not present

## 2015-07-19 DIAGNOSIS — R488 Other symbolic dysfunctions: Secondary | ICD-10-CM | POA: Diagnosis not present

## 2015-07-19 DIAGNOSIS — R2681 Unsteadiness on feet: Secondary | ICD-10-CM | POA: Diagnosis not present

## 2015-07-20 DIAGNOSIS — R488 Other symbolic dysfunctions: Secondary | ICD-10-CM | POA: Diagnosis not present

## 2015-07-20 DIAGNOSIS — M6281 Muscle weakness (generalized): Secondary | ICD-10-CM | POA: Diagnosis not present

## 2015-07-20 DIAGNOSIS — Z7901 Long term (current) use of anticoagulants: Secondary | ICD-10-CM | POA: Diagnosis not present

## 2015-07-20 DIAGNOSIS — R2681 Unsteadiness on feet: Secondary | ICD-10-CM | POA: Diagnosis not present

## 2015-07-20 DIAGNOSIS — I4891 Unspecified atrial fibrillation: Secondary | ICD-10-CM | POA: Diagnosis not present

## 2015-07-21 DIAGNOSIS — R488 Other symbolic dysfunctions: Secondary | ICD-10-CM | POA: Diagnosis not present

## 2015-07-21 DIAGNOSIS — M6281 Muscle weakness (generalized): Secondary | ICD-10-CM | POA: Diagnosis not present

## 2015-07-21 DIAGNOSIS — R2681 Unsteadiness on feet: Secondary | ICD-10-CM | POA: Diagnosis not present

## 2015-07-26 DIAGNOSIS — R488 Other symbolic dysfunctions: Secondary | ICD-10-CM | POA: Diagnosis not present

## 2015-07-26 DIAGNOSIS — M6281 Muscle weakness (generalized): Secondary | ICD-10-CM | POA: Diagnosis not present

## 2015-07-26 DIAGNOSIS — R2681 Unsteadiness on feet: Secondary | ICD-10-CM | POA: Diagnosis not present

## 2015-07-27 DIAGNOSIS — R488 Other symbolic dysfunctions: Secondary | ICD-10-CM | POA: Diagnosis not present

## 2015-07-27 DIAGNOSIS — R2681 Unsteadiness on feet: Secondary | ICD-10-CM | POA: Diagnosis not present

## 2015-07-27 DIAGNOSIS — M6281 Muscle weakness (generalized): Secondary | ICD-10-CM | POA: Diagnosis not present

## 2015-08-23 DIAGNOSIS — Z7901 Long term (current) use of anticoagulants: Secondary | ICD-10-CM | POA: Diagnosis not present

## 2015-08-23 DIAGNOSIS — C4491 Basal cell carcinoma of skin, unspecified: Secondary | ICD-10-CM | POA: Diagnosis not present

## 2015-09-13 DIAGNOSIS — N390 Urinary tract infection, site not specified: Secondary | ICD-10-CM | POA: Diagnosis not present

## 2015-09-17 DIAGNOSIS — R21 Rash and other nonspecific skin eruption: Secondary | ICD-10-CM | POA: Diagnosis not present

## 2015-09-26 DIAGNOSIS — B86 Scabies: Secondary | ICD-10-CM | POA: Diagnosis not present

## 2015-09-26 DIAGNOSIS — L309 Dermatitis, unspecified: Secondary | ICD-10-CM | POA: Diagnosis not present

## 2015-09-28 DIAGNOSIS — R21 Rash and other nonspecific skin eruption: Secondary | ICD-10-CM | POA: Diagnosis not present

## 2015-09-28 DIAGNOSIS — I4891 Unspecified atrial fibrillation: Secondary | ICD-10-CM | POA: Diagnosis not present

## 2015-09-28 DIAGNOSIS — Z7901 Long term (current) use of anticoagulants: Secondary | ICD-10-CM | POA: Diagnosis not present

## 2015-10-07 DIAGNOSIS — Z7901 Long term (current) use of anticoagulants: Secondary | ICD-10-CM | POA: Diagnosis not present

## 2015-10-07 DIAGNOSIS — I4891 Unspecified atrial fibrillation: Secondary | ICD-10-CM | POA: Diagnosis not present

## 2015-10-15 DIAGNOSIS — C44319 Basal cell carcinoma of skin of other parts of face: Secondary | ICD-10-CM | POA: Diagnosis not present

## 2015-10-15 DIAGNOSIS — D485 Neoplasm of uncertain behavior of skin: Secondary | ICD-10-CM | POA: Diagnosis not present

## 2015-10-15 DIAGNOSIS — C4359 Malignant melanoma of other part of trunk: Secondary | ICD-10-CM | POA: Diagnosis not present

## 2015-11-10 DIAGNOSIS — C44319 Basal cell carcinoma of skin of other parts of face: Secondary | ICD-10-CM | POA: Diagnosis not present

## 2015-11-10 DIAGNOSIS — C4359 Malignant melanoma of other part of trunk: Secondary | ICD-10-CM | POA: Diagnosis not present

## 2015-11-10 DIAGNOSIS — D485 Neoplasm of uncertain behavior of skin: Secondary | ICD-10-CM | POA: Diagnosis not present

## 2015-11-16 DIAGNOSIS — Z0000 Encounter for general adult medical examination without abnormal findings: Secondary | ICD-10-CM | POA: Diagnosis not present

## 2015-11-16 DIAGNOSIS — N390 Urinary tract infection, site not specified: Secondary | ICD-10-CM | POA: Diagnosis not present

## 2015-11-16 DIAGNOSIS — N3281 Overactive bladder: Secondary | ICD-10-CM | POA: Diagnosis not present

## 2015-11-17 DIAGNOSIS — H401113 Primary open-angle glaucoma, right eye, severe stage: Secondary | ICD-10-CM | POA: Diagnosis not present

## 2015-11-17 DIAGNOSIS — H401123 Primary open-angle glaucoma, left eye, severe stage: Secondary | ICD-10-CM | POA: Diagnosis not present

## 2015-12-13 DIAGNOSIS — I482 Chronic atrial fibrillation: Secondary | ICD-10-CM | POA: Diagnosis not present

## 2015-12-13 DIAGNOSIS — Z7901 Long term (current) use of anticoagulants: Secondary | ICD-10-CM | POA: Diagnosis not present

## 2015-12-17 DIAGNOSIS — H401133 Primary open-angle glaucoma, bilateral, severe stage: Secondary | ICD-10-CM | POA: Diagnosis not present

## 2016-01-18 DIAGNOSIS — I4891 Unspecified atrial fibrillation: Secondary | ICD-10-CM | POA: Diagnosis not present

## 2016-01-18 DIAGNOSIS — Z7901 Long term (current) use of anticoagulants: Secondary | ICD-10-CM | POA: Diagnosis not present

## 2016-01-18 DIAGNOSIS — R21 Rash and other nonspecific skin eruption: Secondary | ICD-10-CM | POA: Diagnosis not present

## 2016-01-19 DIAGNOSIS — N390 Urinary tract infection, site not specified: Secondary | ICD-10-CM | POA: Diagnosis not present

## 2016-01-21 DIAGNOSIS — N390 Urinary tract infection, site not specified: Secondary | ICD-10-CM | POA: Diagnosis not present

## 2016-01-28 DIAGNOSIS — R296 Repeated falls: Secondary | ICD-10-CM | POA: Diagnosis not present

## 2016-01-28 DIAGNOSIS — R488 Other symbolic dysfunctions: Secondary | ICD-10-CM | POA: Diagnosis not present

## 2016-01-28 DIAGNOSIS — M6281 Muscle weakness (generalized): Secondary | ICD-10-CM | POA: Diagnosis not present

## 2016-01-31 DIAGNOSIS — R488 Other symbolic dysfunctions: Secondary | ICD-10-CM | POA: Diagnosis not present

## 2016-01-31 DIAGNOSIS — R296 Repeated falls: Secondary | ICD-10-CM | POA: Diagnosis not present

## 2016-01-31 DIAGNOSIS — M6281 Muscle weakness (generalized): Secondary | ICD-10-CM | POA: Diagnosis not present

## 2016-02-01 DIAGNOSIS — R488 Other symbolic dysfunctions: Secondary | ICD-10-CM | POA: Diagnosis not present

## 2016-02-01 DIAGNOSIS — R296 Repeated falls: Secondary | ICD-10-CM | POA: Diagnosis not present

## 2016-02-01 DIAGNOSIS — M6281 Muscle weakness (generalized): Secondary | ICD-10-CM | POA: Diagnosis not present

## 2016-02-02 DIAGNOSIS — R296 Repeated falls: Secondary | ICD-10-CM | POA: Diagnosis not present

## 2016-02-02 DIAGNOSIS — R488 Other symbolic dysfunctions: Secondary | ICD-10-CM | POA: Diagnosis not present

## 2016-02-02 DIAGNOSIS — M6281 Muscle weakness (generalized): Secondary | ICD-10-CM | POA: Diagnosis not present

## 2016-02-03 DIAGNOSIS — M6281 Muscle weakness (generalized): Secondary | ICD-10-CM | POA: Diagnosis not present

## 2016-02-03 DIAGNOSIS — R296 Repeated falls: Secondary | ICD-10-CM | POA: Diagnosis not present

## 2016-02-03 DIAGNOSIS — R488 Other symbolic dysfunctions: Secondary | ICD-10-CM | POA: Diagnosis not present

## 2016-02-04 DIAGNOSIS — R488 Other symbolic dysfunctions: Secondary | ICD-10-CM | POA: Diagnosis not present

## 2016-02-04 DIAGNOSIS — R296 Repeated falls: Secondary | ICD-10-CM | POA: Diagnosis not present

## 2016-02-04 DIAGNOSIS — M6281 Muscle weakness (generalized): Secondary | ICD-10-CM | POA: Diagnosis not present

## 2016-02-07 DIAGNOSIS — R296 Repeated falls: Secondary | ICD-10-CM | POA: Diagnosis not present

## 2016-02-07 DIAGNOSIS — M6281 Muscle weakness (generalized): Secondary | ICD-10-CM | POA: Diagnosis not present

## 2016-02-07 DIAGNOSIS — R488 Other symbolic dysfunctions: Secondary | ICD-10-CM | POA: Diagnosis not present

## 2016-02-08 DIAGNOSIS — R278 Other lack of coordination: Secondary | ICD-10-CM | POA: Diagnosis not present

## 2016-02-08 DIAGNOSIS — R262 Difficulty in walking, not elsewhere classified: Secondary | ICD-10-CM | POA: Diagnosis not present

## 2016-02-08 DIAGNOSIS — R296 Repeated falls: Secondary | ICD-10-CM | POA: Diagnosis not present

## 2016-02-08 DIAGNOSIS — M545 Low back pain: Secondary | ICD-10-CM | POA: Diagnosis not present

## 2016-02-08 DIAGNOSIS — M6281 Muscle weakness (generalized): Secondary | ICD-10-CM | POA: Diagnosis not present

## 2016-02-08 DIAGNOSIS — Z9181 History of falling: Secondary | ICD-10-CM | POA: Diagnosis not present

## 2016-02-08 DIAGNOSIS — R488 Other symbolic dysfunctions: Secondary | ICD-10-CM | POA: Diagnosis not present

## 2016-02-14 DIAGNOSIS — I4891 Unspecified atrial fibrillation: Secondary | ICD-10-CM | POA: Diagnosis not present

## 2016-02-14 DIAGNOSIS — Z7901 Long term (current) use of anticoagulants: Secondary | ICD-10-CM | POA: Diagnosis not present

## 2016-02-22 DIAGNOSIS — R197 Diarrhea, unspecified: Secondary | ICD-10-CM | POA: Diagnosis not present

## 2016-02-22 DIAGNOSIS — Z7901 Long term (current) use of anticoagulants: Secondary | ICD-10-CM | POA: Diagnosis not present

## 2016-02-22 DIAGNOSIS — I4891 Unspecified atrial fibrillation: Secondary | ICD-10-CM | POA: Diagnosis not present

## 2016-02-25 DIAGNOSIS — S9032XA Contusion of left foot, initial encounter: Secondary | ICD-10-CM | POA: Diagnosis not present

## 2016-02-25 DIAGNOSIS — S9002XA Contusion of left ankle, initial encounter: Secondary | ICD-10-CM | POA: Diagnosis not present

## 2016-03-07 DIAGNOSIS — Z7901 Long term (current) use of anticoagulants: Secondary | ICD-10-CM | POA: Diagnosis not present

## 2016-03-07 DIAGNOSIS — Z23 Encounter for immunization: Secondary | ICD-10-CM | POA: Diagnosis not present

## 2016-03-10 DIAGNOSIS — M6281 Muscle weakness (generalized): Secondary | ICD-10-CM | POA: Diagnosis not present

## 2016-03-10 DIAGNOSIS — R262 Difficulty in walking, not elsewhere classified: Secondary | ICD-10-CM | POA: Diagnosis not present

## 2016-03-10 DIAGNOSIS — M545 Low back pain: Secondary | ICD-10-CM | POA: Diagnosis not present

## 2016-03-10 DIAGNOSIS — R278 Other lack of coordination: Secondary | ICD-10-CM | POA: Diagnosis not present

## 2016-03-10 DIAGNOSIS — Z9181 History of falling: Secondary | ICD-10-CM | POA: Diagnosis not present

## 2016-03-10 DIAGNOSIS — R488 Other symbolic dysfunctions: Secondary | ICD-10-CM | POA: Diagnosis not present

## 2016-03-10 DIAGNOSIS — R296 Repeated falls: Secondary | ICD-10-CM | POA: Diagnosis not present

## 2016-03-14 DIAGNOSIS — R488 Other symbolic dysfunctions: Secondary | ICD-10-CM | POA: Diagnosis not present

## 2016-03-14 DIAGNOSIS — R296 Repeated falls: Secondary | ICD-10-CM | POA: Diagnosis not present

## 2016-03-14 DIAGNOSIS — R262 Difficulty in walking, not elsewhere classified: Secondary | ICD-10-CM | POA: Diagnosis not present

## 2016-03-14 DIAGNOSIS — R278 Other lack of coordination: Secondary | ICD-10-CM | POA: Diagnosis not present

## 2016-03-14 DIAGNOSIS — M545 Low back pain: Secondary | ICD-10-CM | POA: Diagnosis not present

## 2016-03-14 DIAGNOSIS — M6281 Muscle weakness (generalized): Secondary | ICD-10-CM | POA: Diagnosis not present

## 2016-03-15 DIAGNOSIS — R278 Other lack of coordination: Secondary | ICD-10-CM | POA: Diagnosis not present

## 2016-03-15 DIAGNOSIS — M6281 Muscle weakness (generalized): Secondary | ICD-10-CM | POA: Diagnosis not present

## 2016-03-15 DIAGNOSIS — R296 Repeated falls: Secondary | ICD-10-CM | POA: Diagnosis not present

## 2016-03-15 DIAGNOSIS — R262 Difficulty in walking, not elsewhere classified: Secondary | ICD-10-CM | POA: Diagnosis not present

## 2016-03-15 DIAGNOSIS — R488 Other symbolic dysfunctions: Secondary | ICD-10-CM | POA: Diagnosis not present

## 2016-03-15 DIAGNOSIS — M545 Low back pain: Secondary | ICD-10-CM | POA: Diagnosis not present

## 2016-03-16 ENCOUNTER — Other Ambulatory Visit: Payer: Self-pay

## 2016-03-16 DIAGNOSIS — M545 Low back pain: Secondary | ICD-10-CM | POA: Diagnosis not present

## 2016-03-16 DIAGNOSIS — R262 Difficulty in walking, not elsewhere classified: Secondary | ICD-10-CM | POA: Diagnosis not present

## 2016-03-16 DIAGNOSIS — M6281 Muscle weakness (generalized): Secondary | ICD-10-CM | POA: Diagnosis not present

## 2016-03-16 DIAGNOSIS — R296 Repeated falls: Secondary | ICD-10-CM | POA: Diagnosis not present

## 2016-03-16 DIAGNOSIS — R278 Other lack of coordination: Secondary | ICD-10-CM | POA: Diagnosis not present

## 2016-03-16 DIAGNOSIS — R488 Other symbolic dysfunctions: Secondary | ICD-10-CM | POA: Diagnosis not present

## 2016-03-17 DIAGNOSIS — R296 Repeated falls: Secondary | ICD-10-CM | POA: Diagnosis not present

## 2016-03-17 DIAGNOSIS — M6281 Muscle weakness (generalized): Secondary | ICD-10-CM | POA: Diagnosis not present

## 2016-03-17 DIAGNOSIS — M545 Low back pain: Secondary | ICD-10-CM | POA: Diagnosis not present

## 2016-03-17 DIAGNOSIS — R278 Other lack of coordination: Secondary | ICD-10-CM | POA: Diagnosis not present

## 2016-03-17 DIAGNOSIS — R262 Difficulty in walking, not elsewhere classified: Secondary | ICD-10-CM | POA: Diagnosis not present

## 2016-03-17 DIAGNOSIS — R488 Other symbolic dysfunctions: Secondary | ICD-10-CM | POA: Diagnosis not present

## 2016-03-20 DIAGNOSIS — R262 Difficulty in walking, not elsewhere classified: Secondary | ICD-10-CM | POA: Diagnosis not present

## 2016-03-20 DIAGNOSIS — R278 Other lack of coordination: Secondary | ICD-10-CM | POA: Diagnosis not present

## 2016-03-20 DIAGNOSIS — R296 Repeated falls: Secondary | ICD-10-CM | POA: Diagnosis not present

## 2016-03-20 DIAGNOSIS — M6281 Muscle weakness (generalized): Secondary | ICD-10-CM | POA: Diagnosis not present

## 2016-03-20 DIAGNOSIS — R488 Other symbolic dysfunctions: Secondary | ICD-10-CM | POA: Diagnosis not present

## 2016-03-20 DIAGNOSIS — M545 Low back pain: Secondary | ICD-10-CM | POA: Diagnosis not present

## 2016-03-21 ENCOUNTER — Other Ambulatory Visit: Payer: Self-pay | Admitting: Family Medicine

## 2016-03-21 ENCOUNTER — Ambulatory Visit
Admission: RE | Admit: 2016-03-21 | Discharge: 2016-03-21 | Disposition: A | Discharge status: Home / Self Care | Payer: Medicare Other | Source: Ambulatory Visit | Attending: Family Medicine | Admitting: Family Medicine

## 2016-03-21 DIAGNOSIS — R296 Repeated falls: Secondary | ICD-10-CM | POA: Diagnosis not present

## 2016-03-21 DIAGNOSIS — M6281 Muscle weakness (generalized): Secondary | ICD-10-CM | POA: Diagnosis not present

## 2016-03-21 DIAGNOSIS — S0990XA Unspecified injury of head, initial encounter: Secondary | ICD-10-CM | POA: Diagnosis not present

## 2016-03-21 DIAGNOSIS — R488 Other symbolic dysfunctions: Secondary | ICD-10-CM | POA: Diagnosis not present

## 2016-03-21 DIAGNOSIS — M545 Low back pain: Secondary | ICD-10-CM | POA: Diagnosis not present

## 2016-03-21 DIAGNOSIS — R262 Difficulty in walking, not elsewhere classified: Secondary | ICD-10-CM | POA: Diagnosis not present

## 2016-03-21 DIAGNOSIS — R278 Other lack of coordination: Secondary | ICD-10-CM | POA: Diagnosis not present

## 2016-03-22 DIAGNOSIS — M6281 Muscle weakness (generalized): Secondary | ICD-10-CM | POA: Diagnosis not present

## 2016-03-22 DIAGNOSIS — M545 Low back pain: Secondary | ICD-10-CM | POA: Diagnosis not present

## 2016-03-22 DIAGNOSIS — R278 Other lack of coordination: Secondary | ICD-10-CM | POA: Diagnosis not present

## 2016-03-22 DIAGNOSIS — R262 Difficulty in walking, not elsewhere classified: Secondary | ICD-10-CM | POA: Diagnosis not present

## 2016-03-22 DIAGNOSIS — R488 Other symbolic dysfunctions: Secondary | ICD-10-CM | POA: Diagnosis not present

## 2016-03-22 DIAGNOSIS — R296 Repeated falls: Secondary | ICD-10-CM | POA: Diagnosis not present

## 2016-03-24 DIAGNOSIS — R278 Other lack of coordination: Secondary | ICD-10-CM | POA: Diagnosis not present

## 2016-03-24 DIAGNOSIS — M545 Low back pain: Secondary | ICD-10-CM | POA: Diagnosis not present

## 2016-03-24 DIAGNOSIS — M6281 Muscle weakness (generalized): Secondary | ICD-10-CM | POA: Diagnosis not present

## 2016-03-24 DIAGNOSIS — R488 Other symbolic dysfunctions: Secondary | ICD-10-CM | POA: Diagnosis not present

## 2016-03-24 DIAGNOSIS — R262 Difficulty in walking, not elsewhere classified: Secondary | ICD-10-CM | POA: Diagnosis not present

## 2016-03-24 DIAGNOSIS — R296 Repeated falls: Secondary | ICD-10-CM | POA: Diagnosis not present

## 2016-03-27 DIAGNOSIS — R262 Difficulty in walking, not elsewhere classified: Secondary | ICD-10-CM | POA: Diagnosis not present

## 2016-03-27 DIAGNOSIS — M6281 Muscle weakness (generalized): Secondary | ICD-10-CM | POA: Diagnosis not present

## 2016-03-27 DIAGNOSIS — R296 Repeated falls: Secondary | ICD-10-CM | POA: Diagnosis not present

## 2016-03-27 DIAGNOSIS — R278 Other lack of coordination: Secondary | ICD-10-CM | POA: Diagnosis not present

## 2016-03-27 DIAGNOSIS — R488 Other symbolic dysfunctions: Secondary | ICD-10-CM | POA: Diagnosis not present

## 2016-03-27 DIAGNOSIS — M545 Low back pain: Secondary | ICD-10-CM | POA: Diagnosis not present

## 2016-03-28 DIAGNOSIS — M545 Low back pain: Secondary | ICD-10-CM | POA: Diagnosis not present

## 2016-03-28 DIAGNOSIS — R296 Repeated falls: Secondary | ICD-10-CM | POA: Diagnosis not present

## 2016-03-28 DIAGNOSIS — M6281 Muscle weakness (generalized): Secondary | ICD-10-CM | POA: Diagnosis not present

## 2016-03-28 DIAGNOSIS — Z7901 Long term (current) use of anticoagulants: Secondary | ICD-10-CM | POA: Diagnosis not present

## 2016-03-28 DIAGNOSIS — R488 Other symbolic dysfunctions: Secondary | ICD-10-CM | POA: Diagnosis not present

## 2016-03-28 DIAGNOSIS — R278 Other lack of coordination: Secondary | ICD-10-CM | POA: Diagnosis not present

## 2016-03-28 DIAGNOSIS — R262 Difficulty in walking, not elsewhere classified: Secondary | ICD-10-CM | POA: Diagnosis not present

## 2016-03-28 DIAGNOSIS — N390 Urinary tract infection, site not specified: Secondary | ICD-10-CM | POA: Diagnosis not present

## 2016-03-29 DIAGNOSIS — M6281 Muscle weakness (generalized): Secondary | ICD-10-CM | POA: Diagnosis not present

## 2016-03-29 DIAGNOSIS — R262 Difficulty in walking, not elsewhere classified: Secondary | ICD-10-CM | POA: Diagnosis not present

## 2016-03-29 DIAGNOSIS — M545 Low back pain: Secondary | ICD-10-CM | POA: Diagnosis not present

## 2016-03-29 DIAGNOSIS — R296 Repeated falls: Secondary | ICD-10-CM | POA: Diagnosis not present

## 2016-03-29 DIAGNOSIS — R488 Other symbolic dysfunctions: Secondary | ICD-10-CM | POA: Diagnosis not present

## 2016-03-29 DIAGNOSIS — R278 Other lack of coordination: Secondary | ICD-10-CM | POA: Diagnosis not present

## 2016-03-30 DIAGNOSIS — M545 Low back pain: Secondary | ICD-10-CM | POA: Diagnosis not present

## 2016-03-30 DIAGNOSIS — N390 Urinary tract infection, site not specified: Secondary | ICD-10-CM | POA: Diagnosis not present

## 2016-03-30 DIAGNOSIS — R488 Other symbolic dysfunctions: Secondary | ICD-10-CM | POA: Diagnosis not present

## 2016-03-30 DIAGNOSIS — R296 Repeated falls: Secondary | ICD-10-CM | POA: Diagnosis not present

## 2016-03-30 DIAGNOSIS — R278 Other lack of coordination: Secondary | ICD-10-CM | POA: Diagnosis not present

## 2016-03-30 DIAGNOSIS — R262 Difficulty in walking, not elsewhere classified: Secondary | ICD-10-CM | POA: Diagnosis not present

## 2016-03-30 DIAGNOSIS — M6281 Muscle weakness (generalized): Secondary | ICD-10-CM | POA: Diagnosis not present

## 2016-03-31 DIAGNOSIS — R278 Other lack of coordination: Secondary | ICD-10-CM | POA: Diagnosis not present

## 2016-03-31 DIAGNOSIS — R296 Repeated falls: Secondary | ICD-10-CM | POA: Diagnosis not present

## 2016-03-31 DIAGNOSIS — M6281 Muscle weakness (generalized): Secondary | ICD-10-CM | POA: Diagnosis not present

## 2016-03-31 DIAGNOSIS — R488 Other symbolic dysfunctions: Secondary | ICD-10-CM | POA: Diagnosis not present

## 2016-03-31 DIAGNOSIS — R262 Difficulty in walking, not elsewhere classified: Secondary | ICD-10-CM | POA: Diagnosis not present

## 2016-03-31 DIAGNOSIS — M545 Low back pain: Secondary | ICD-10-CM | POA: Diagnosis not present

## 2016-04-03 DIAGNOSIS — R262 Difficulty in walking, not elsewhere classified: Secondary | ICD-10-CM | POA: Diagnosis not present

## 2016-04-03 DIAGNOSIS — R278 Other lack of coordination: Secondary | ICD-10-CM | POA: Diagnosis not present

## 2016-04-03 DIAGNOSIS — R296 Repeated falls: Secondary | ICD-10-CM | POA: Diagnosis not present

## 2016-04-03 DIAGNOSIS — M545 Low back pain: Secondary | ICD-10-CM | POA: Diagnosis not present

## 2016-04-03 DIAGNOSIS — M6281 Muscle weakness (generalized): Secondary | ICD-10-CM | POA: Diagnosis not present

## 2016-04-03 DIAGNOSIS — R488 Other symbolic dysfunctions: Secondary | ICD-10-CM | POA: Diagnosis not present

## 2016-04-04 DIAGNOSIS — R296 Repeated falls: Secondary | ICD-10-CM | POA: Diagnosis not present

## 2016-04-04 DIAGNOSIS — R262 Difficulty in walking, not elsewhere classified: Secondary | ICD-10-CM | POA: Diagnosis not present

## 2016-04-04 DIAGNOSIS — R488 Other symbolic dysfunctions: Secondary | ICD-10-CM | POA: Diagnosis not present

## 2016-04-04 DIAGNOSIS — M6281 Muscle weakness (generalized): Secondary | ICD-10-CM | POA: Diagnosis not present

## 2016-04-04 DIAGNOSIS — M545 Low back pain: Secondary | ICD-10-CM | POA: Diagnosis not present

## 2016-04-04 DIAGNOSIS — R278 Other lack of coordination: Secondary | ICD-10-CM | POA: Diagnosis not present

## 2016-04-05 DIAGNOSIS — R296 Repeated falls: Secondary | ICD-10-CM | POA: Diagnosis not present

## 2016-04-05 DIAGNOSIS — M6281 Muscle weakness (generalized): Secondary | ICD-10-CM | POA: Diagnosis not present

## 2016-04-05 DIAGNOSIS — R488 Other symbolic dysfunctions: Secondary | ICD-10-CM | POA: Diagnosis not present

## 2016-04-05 DIAGNOSIS — R262 Difficulty in walking, not elsewhere classified: Secondary | ICD-10-CM | POA: Diagnosis not present

## 2016-04-05 DIAGNOSIS — M545 Low back pain: Secondary | ICD-10-CM | POA: Diagnosis not present

## 2016-04-05 DIAGNOSIS — R278 Other lack of coordination: Secondary | ICD-10-CM | POA: Diagnosis not present

## 2016-04-06 DIAGNOSIS — R262 Difficulty in walking, not elsewhere classified: Secondary | ICD-10-CM | POA: Diagnosis not present

## 2016-04-06 DIAGNOSIS — R278 Other lack of coordination: Secondary | ICD-10-CM | POA: Diagnosis not present

## 2016-04-06 DIAGNOSIS — M6281 Muscle weakness (generalized): Secondary | ICD-10-CM | POA: Diagnosis not present

## 2016-04-06 DIAGNOSIS — M545 Low back pain: Secondary | ICD-10-CM | POA: Diagnosis not present

## 2016-04-06 DIAGNOSIS — R488 Other symbolic dysfunctions: Secondary | ICD-10-CM | POA: Diagnosis not present

## 2016-04-06 DIAGNOSIS — R296 Repeated falls: Secondary | ICD-10-CM | POA: Diagnosis not present

## 2016-04-07 DIAGNOSIS — R262 Difficulty in walking, not elsewhere classified: Secondary | ICD-10-CM | POA: Diagnosis not present

## 2016-04-07 DIAGNOSIS — M6281 Muscle weakness (generalized): Secondary | ICD-10-CM | POA: Diagnosis not present

## 2016-04-07 DIAGNOSIS — M545 Low back pain: Secondary | ICD-10-CM | POA: Diagnosis not present

## 2016-04-07 DIAGNOSIS — R278 Other lack of coordination: Secondary | ICD-10-CM | POA: Diagnosis not present

## 2016-04-07 DIAGNOSIS — R488 Other symbolic dysfunctions: Secondary | ICD-10-CM | POA: Diagnosis not present

## 2016-04-07 DIAGNOSIS — R296 Repeated falls: Secondary | ICD-10-CM | POA: Diagnosis not present

## 2016-04-10 DIAGNOSIS — Z9181 History of falling: Secondary | ICD-10-CM | POA: Diagnosis not present

## 2016-04-10 DIAGNOSIS — R488 Other symbolic dysfunctions: Secondary | ICD-10-CM | POA: Diagnosis not present

## 2016-04-10 DIAGNOSIS — R296 Repeated falls: Secondary | ICD-10-CM | POA: Diagnosis not present

## 2016-04-10 DIAGNOSIS — M545 Low back pain: Secondary | ICD-10-CM | POA: Diagnosis not present

## 2016-04-10 DIAGNOSIS — R262 Difficulty in walking, not elsewhere classified: Secondary | ICD-10-CM | POA: Diagnosis not present

## 2016-04-10 DIAGNOSIS — R278 Other lack of coordination: Secondary | ICD-10-CM | POA: Diagnosis not present

## 2016-04-10 DIAGNOSIS — M6281 Muscle weakness (generalized): Secondary | ICD-10-CM | POA: Diagnosis not present

## 2016-04-11 DIAGNOSIS — M6281 Muscle weakness (generalized): Secondary | ICD-10-CM | POA: Diagnosis not present

## 2016-04-11 DIAGNOSIS — R278 Other lack of coordination: Secondary | ICD-10-CM | POA: Diagnosis not present

## 2016-04-11 DIAGNOSIS — R296 Repeated falls: Secondary | ICD-10-CM | POA: Diagnosis not present

## 2016-04-11 DIAGNOSIS — M545 Low back pain: Secondary | ICD-10-CM | POA: Diagnosis not present

## 2016-04-11 DIAGNOSIS — R488 Other symbolic dysfunctions: Secondary | ICD-10-CM | POA: Diagnosis not present

## 2016-04-11 DIAGNOSIS — Z7901 Long term (current) use of anticoagulants: Secondary | ICD-10-CM | POA: Diagnosis not present

## 2016-04-11 DIAGNOSIS — R262 Difficulty in walking, not elsewhere classified: Secondary | ICD-10-CM | POA: Diagnosis not present

## 2016-04-12 DIAGNOSIS — M6281 Muscle weakness (generalized): Secondary | ICD-10-CM | POA: Diagnosis not present

## 2016-04-12 DIAGNOSIS — R262 Difficulty in walking, not elsewhere classified: Secondary | ICD-10-CM | POA: Diagnosis not present

## 2016-04-12 DIAGNOSIS — R278 Other lack of coordination: Secondary | ICD-10-CM | POA: Diagnosis not present

## 2016-04-12 DIAGNOSIS — M545 Low back pain: Secondary | ICD-10-CM | POA: Diagnosis not present

## 2016-04-12 DIAGNOSIS — R296 Repeated falls: Secondary | ICD-10-CM | POA: Diagnosis not present

## 2016-04-12 DIAGNOSIS — R488 Other symbolic dysfunctions: Secondary | ICD-10-CM | POA: Diagnosis not present

## 2016-04-13 DIAGNOSIS — M6281 Muscle weakness (generalized): Secondary | ICD-10-CM | POA: Diagnosis not present

## 2016-04-13 DIAGNOSIS — R278 Other lack of coordination: Secondary | ICD-10-CM | POA: Diagnosis not present

## 2016-04-13 DIAGNOSIS — M545 Low back pain: Secondary | ICD-10-CM | POA: Diagnosis not present

## 2016-04-13 DIAGNOSIS — R262 Difficulty in walking, not elsewhere classified: Secondary | ICD-10-CM | POA: Diagnosis not present

## 2016-04-13 DIAGNOSIS — R296 Repeated falls: Secondary | ICD-10-CM | POA: Diagnosis not present

## 2016-04-13 DIAGNOSIS — R488 Other symbolic dysfunctions: Secondary | ICD-10-CM | POA: Diagnosis not present

## 2016-04-14 DIAGNOSIS — M6281 Muscle weakness (generalized): Secondary | ICD-10-CM | POA: Diagnosis not present

## 2016-04-14 DIAGNOSIS — R278 Other lack of coordination: Secondary | ICD-10-CM | POA: Diagnosis not present

## 2016-04-14 DIAGNOSIS — M545 Low back pain: Secondary | ICD-10-CM | POA: Diagnosis not present

## 2016-04-14 DIAGNOSIS — R262 Difficulty in walking, not elsewhere classified: Secondary | ICD-10-CM | POA: Diagnosis not present

## 2016-04-14 DIAGNOSIS — R296 Repeated falls: Secondary | ICD-10-CM | POA: Diagnosis not present

## 2016-04-14 DIAGNOSIS — R488 Other symbolic dysfunctions: Secondary | ICD-10-CM | POA: Diagnosis not present

## 2016-04-17 DIAGNOSIS — R262 Difficulty in walking, not elsewhere classified: Secondary | ICD-10-CM | POA: Diagnosis not present

## 2016-04-17 DIAGNOSIS — M545 Low back pain: Secondary | ICD-10-CM | POA: Diagnosis not present

## 2016-04-17 DIAGNOSIS — R296 Repeated falls: Secondary | ICD-10-CM | POA: Diagnosis not present

## 2016-04-17 DIAGNOSIS — R488 Other symbolic dysfunctions: Secondary | ICD-10-CM | POA: Diagnosis not present

## 2016-04-17 DIAGNOSIS — R278 Other lack of coordination: Secondary | ICD-10-CM | POA: Diagnosis not present

## 2016-04-17 DIAGNOSIS — M6281 Muscle weakness (generalized): Secondary | ICD-10-CM | POA: Diagnosis not present

## 2016-04-18 DIAGNOSIS — M545 Low back pain: Secondary | ICD-10-CM | POA: Diagnosis not present

## 2016-04-18 DIAGNOSIS — H401123 Primary open-angle glaucoma, left eye, severe stage: Secondary | ICD-10-CM | POA: Diagnosis not present

## 2016-04-18 DIAGNOSIS — R262 Difficulty in walking, not elsewhere classified: Secondary | ICD-10-CM | POA: Diagnosis not present

## 2016-04-18 DIAGNOSIS — R296 Repeated falls: Secondary | ICD-10-CM | POA: Diagnosis not present

## 2016-04-18 DIAGNOSIS — H401113 Primary open-angle glaucoma, right eye, severe stage: Secondary | ICD-10-CM | POA: Diagnosis not present

## 2016-04-18 DIAGNOSIS — M6281 Muscle weakness (generalized): Secondary | ICD-10-CM | POA: Diagnosis not present

## 2016-04-18 DIAGNOSIS — Z961 Presence of intraocular lens: Secondary | ICD-10-CM | POA: Diagnosis not present

## 2016-04-18 DIAGNOSIS — R488 Other symbolic dysfunctions: Secondary | ICD-10-CM | POA: Diagnosis not present

## 2016-04-18 DIAGNOSIS — H52203 Unspecified astigmatism, bilateral: Secondary | ICD-10-CM | POA: Diagnosis not present

## 2016-04-18 DIAGNOSIS — R278 Other lack of coordination: Secondary | ICD-10-CM | POA: Diagnosis not present

## 2016-04-19 DIAGNOSIS — R488 Other symbolic dysfunctions: Secondary | ICD-10-CM | POA: Diagnosis not present

## 2016-04-19 DIAGNOSIS — R262 Difficulty in walking, not elsewhere classified: Secondary | ICD-10-CM | POA: Diagnosis not present

## 2016-04-19 DIAGNOSIS — R296 Repeated falls: Secondary | ICD-10-CM | POA: Diagnosis not present

## 2016-04-19 DIAGNOSIS — M545 Low back pain: Secondary | ICD-10-CM | POA: Diagnosis not present

## 2016-04-19 DIAGNOSIS — M6281 Muscle weakness (generalized): Secondary | ICD-10-CM | POA: Diagnosis not present

## 2016-04-19 DIAGNOSIS — R278 Other lack of coordination: Secondary | ICD-10-CM | POA: Diagnosis not present

## 2016-04-20 DIAGNOSIS — M6281 Muscle weakness (generalized): Secondary | ICD-10-CM | POA: Diagnosis not present

## 2016-04-20 DIAGNOSIS — R278 Other lack of coordination: Secondary | ICD-10-CM | POA: Diagnosis not present

## 2016-04-20 DIAGNOSIS — R296 Repeated falls: Secondary | ICD-10-CM | POA: Diagnosis not present

## 2016-04-20 DIAGNOSIS — M545 Low back pain: Secondary | ICD-10-CM | POA: Diagnosis not present

## 2016-04-20 DIAGNOSIS — R262 Difficulty in walking, not elsewhere classified: Secondary | ICD-10-CM | POA: Diagnosis not present

## 2016-04-20 DIAGNOSIS — R488 Other symbolic dysfunctions: Secondary | ICD-10-CM | POA: Diagnosis not present

## 2016-04-21 DIAGNOSIS — R488 Other symbolic dysfunctions: Secondary | ICD-10-CM | POA: Diagnosis not present

## 2016-04-21 DIAGNOSIS — M6281 Muscle weakness (generalized): Secondary | ICD-10-CM | POA: Diagnosis not present

## 2016-04-21 DIAGNOSIS — R262 Difficulty in walking, not elsewhere classified: Secondary | ICD-10-CM | POA: Diagnosis not present

## 2016-04-21 DIAGNOSIS — R296 Repeated falls: Secondary | ICD-10-CM | POA: Diagnosis not present

## 2016-04-21 DIAGNOSIS — R278 Other lack of coordination: Secondary | ICD-10-CM | POA: Diagnosis not present

## 2016-04-21 DIAGNOSIS — M545 Low back pain: Secondary | ICD-10-CM | POA: Diagnosis not present

## 2016-04-24 DIAGNOSIS — R488 Other symbolic dysfunctions: Secondary | ICD-10-CM | POA: Diagnosis not present

## 2016-04-24 DIAGNOSIS — M545 Low back pain: Secondary | ICD-10-CM | POA: Diagnosis not present

## 2016-04-24 DIAGNOSIS — R262 Difficulty in walking, not elsewhere classified: Secondary | ICD-10-CM | POA: Diagnosis not present

## 2016-04-24 DIAGNOSIS — M6281 Muscle weakness (generalized): Secondary | ICD-10-CM | POA: Diagnosis not present

## 2016-04-24 DIAGNOSIS — R278 Other lack of coordination: Secondary | ICD-10-CM | POA: Diagnosis not present

## 2016-04-24 DIAGNOSIS — R296 Repeated falls: Secondary | ICD-10-CM | POA: Diagnosis not present

## 2016-04-25 DIAGNOSIS — R278 Other lack of coordination: Secondary | ICD-10-CM | POA: Diagnosis not present

## 2016-04-25 DIAGNOSIS — R488 Other symbolic dysfunctions: Secondary | ICD-10-CM | POA: Diagnosis not present

## 2016-04-25 DIAGNOSIS — M545 Low back pain: Secondary | ICD-10-CM | POA: Diagnosis not present

## 2016-04-25 DIAGNOSIS — M6281 Muscle weakness (generalized): Secondary | ICD-10-CM | POA: Diagnosis not present

## 2016-04-25 DIAGNOSIS — R296 Repeated falls: Secondary | ICD-10-CM | POA: Diagnosis not present

## 2016-04-25 DIAGNOSIS — R262 Difficulty in walking, not elsewhere classified: Secondary | ICD-10-CM | POA: Diagnosis not present

## 2016-04-26 DIAGNOSIS — R262 Difficulty in walking, not elsewhere classified: Secondary | ICD-10-CM | POA: Diagnosis not present

## 2016-04-26 DIAGNOSIS — M545 Low back pain: Secondary | ICD-10-CM | POA: Diagnosis not present

## 2016-04-26 DIAGNOSIS — R296 Repeated falls: Secondary | ICD-10-CM | POA: Diagnosis not present

## 2016-04-26 DIAGNOSIS — R488 Other symbolic dysfunctions: Secondary | ICD-10-CM | POA: Diagnosis not present

## 2016-04-26 DIAGNOSIS — R278 Other lack of coordination: Secondary | ICD-10-CM | POA: Diagnosis not present

## 2016-04-26 DIAGNOSIS — M6281 Muscle weakness (generalized): Secondary | ICD-10-CM | POA: Diagnosis not present

## 2016-04-27 DIAGNOSIS — R296 Repeated falls: Secondary | ICD-10-CM | POA: Diagnosis not present

## 2016-04-27 DIAGNOSIS — M6281 Muscle weakness (generalized): Secondary | ICD-10-CM | POA: Diagnosis not present

## 2016-04-27 DIAGNOSIS — R488 Other symbolic dysfunctions: Secondary | ICD-10-CM | POA: Diagnosis not present

## 2016-04-27 DIAGNOSIS — M545 Low back pain: Secondary | ICD-10-CM | POA: Diagnosis not present

## 2016-04-27 DIAGNOSIS — R278 Other lack of coordination: Secondary | ICD-10-CM | POA: Diagnosis not present

## 2016-04-27 DIAGNOSIS — R262 Difficulty in walking, not elsewhere classified: Secondary | ICD-10-CM | POA: Diagnosis not present

## 2016-04-28 DIAGNOSIS — M6281 Muscle weakness (generalized): Secondary | ICD-10-CM | POA: Diagnosis not present

## 2016-04-28 DIAGNOSIS — M545 Low back pain: Secondary | ICD-10-CM | POA: Diagnosis not present

## 2016-04-28 DIAGNOSIS — R488 Other symbolic dysfunctions: Secondary | ICD-10-CM | POA: Diagnosis not present

## 2016-04-28 DIAGNOSIS — R262 Difficulty in walking, not elsewhere classified: Secondary | ICD-10-CM | POA: Diagnosis not present

## 2016-04-28 DIAGNOSIS — R278 Other lack of coordination: Secondary | ICD-10-CM | POA: Diagnosis not present

## 2016-04-28 DIAGNOSIS — R296 Repeated falls: Secondary | ICD-10-CM | POA: Diagnosis not present

## 2016-05-01 DIAGNOSIS — R488 Other symbolic dysfunctions: Secondary | ICD-10-CM | POA: Diagnosis not present

## 2016-05-01 DIAGNOSIS — R296 Repeated falls: Secondary | ICD-10-CM | POA: Diagnosis not present

## 2016-05-01 DIAGNOSIS — M6281 Muscle weakness (generalized): Secondary | ICD-10-CM | POA: Diagnosis not present

## 2016-05-01 DIAGNOSIS — R278 Other lack of coordination: Secondary | ICD-10-CM | POA: Diagnosis not present

## 2016-05-01 DIAGNOSIS — R262 Difficulty in walking, not elsewhere classified: Secondary | ICD-10-CM | POA: Diagnosis not present

## 2016-05-01 DIAGNOSIS — M545 Low back pain: Secondary | ICD-10-CM | POA: Diagnosis not present

## 2016-05-02 DIAGNOSIS — R488 Other symbolic dysfunctions: Secondary | ICD-10-CM | POA: Diagnosis not present

## 2016-05-02 DIAGNOSIS — M6281 Muscle weakness (generalized): Secondary | ICD-10-CM | POA: Diagnosis not present

## 2016-05-02 DIAGNOSIS — R262 Difficulty in walking, not elsewhere classified: Secondary | ICD-10-CM | POA: Diagnosis not present

## 2016-05-02 DIAGNOSIS — R296 Repeated falls: Secondary | ICD-10-CM | POA: Diagnosis not present

## 2016-05-02 DIAGNOSIS — R278 Other lack of coordination: Secondary | ICD-10-CM | POA: Diagnosis not present

## 2016-05-02 DIAGNOSIS — M545 Low back pain: Secondary | ICD-10-CM | POA: Diagnosis not present

## 2016-05-03 DIAGNOSIS — R296 Repeated falls: Secondary | ICD-10-CM | POA: Diagnosis not present

## 2016-05-03 DIAGNOSIS — M6281 Muscle weakness (generalized): Secondary | ICD-10-CM | POA: Diagnosis not present

## 2016-05-03 DIAGNOSIS — R488 Other symbolic dysfunctions: Secondary | ICD-10-CM | POA: Diagnosis not present

## 2016-05-03 DIAGNOSIS — H34832 Tributary (branch) retinal vein occlusion, left eye, with macular edema: Secondary | ICD-10-CM | POA: Diagnosis not present

## 2016-05-03 DIAGNOSIS — R278 Other lack of coordination: Secondary | ICD-10-CM | POA: Diagnosis not present

## 2016-05-03 DIAGNOSIS — I482 Chronic atrial fibrillation: Secondary | ICD-10-CM | POA: Diagnosis not present

## 2016-05-03 DIAGNOSIS — Z7901 Long term (current) use of anticoagulants: Secondary | ICD-10-CM | POA: Diagnosis not present

## 2016-05-03 DIAGNOSIS — M545 Low back pain: Secondary | ICD-10-CM | POA: Diagnosis not present

## 2016-05-03 DIAGNOSIS — R262 Difficulty in walking, not elsewhere classified: Secondary | ICD-10-CM | POA: Diagnosis not present

## 2016-05-04 DIAGNOSIS — R488 Other symbolic dysfunctions: Secondary | ICD-10-CM | POA: Diagnosis not present

## 2016-05-04 DIAGNOSIS — M6281 Muscle weakness (generalized): Secondary | ICD-10-CM | POA: Diagnosis not present

## 2016-05-04 DIAGNOSIS — R296 Repeated falls: Secondary | ICD-10-CM | POA: Diagnosis not present

## 2016-05-04 DIAGNOSIS — R278 Other lack of coordination: Secondary | ICD-10-CM | POA: Diagnosis not present

## 2016-05-04 DIAGNOSIS — R262 Difficulty in walking, not elsewhere classified: Secondary | ICD-10-CM | POA: Diagnosis not present

## 2016-05-04 DIAGNOSIS — M545 Low back pain: Secondary | ICD-10-CM | POA: Diagnosis not present

## 2016-05-05 DIAGNOSIS — R262 Difficulty in walking, not elsewhere classified: Secondary | ICD-10-CM | POA: Diagnosis not present

## 2016-05-05 DIAGNOSIS — M6281 Muscle weakness (generalized): Secondary | ICD-10-CM | POA: Diagnosis not present

## 2016-05-05 DIAGNOSIS — M545 Low back pain: Secondary | ICD-10-CM | POA: Diagnosis not present

## 2016-05-05 DIAGNOSIS — R278 Other lack of coordination: Secondary | ICD-10-CM | POA: Diagnosis not present

## 2016-05-05 DIAGNOSIS — R296 Repeated falls: Secondary | ICD-10-CM | POA: Diagnosis not present

## 2016-05-05 DIAGNOSIS — R488 Other symbolic dysfunctions: Secondary | ICD-10-CM | POA: Diagnosis not present

## 2016-05-08 DIAGNOSIS — R278 Other lack of coordination: Secondary | ICD-10-CM | POA: Diagnosis not present

## 2016-05-08 DIAGNOSIS — R488 Other symbolic dysfunctions: Secondary | ICD-10-CM | POA: Diagnosis not present

## 2016-05-08 DIAGNOSIS — M545 Low back pain: Secondary | ICD-10-CM | POA: Diagnosis not present

## 2016-05-08 DIAGNOSIS — M6281 Muscle weakness (generalized): Secondary | ICD-10-CM | POA: Diagnosis not present

## 2016-05-08 DIAGNOSIS — R296 Repeated falls: Secondary | ICD-10-CM | POA: Diagnosis not present

## 2016-05-08 DIAGNOSIS — R262 Difficulty in walking, not elsewhere classified: Secondary | ICD-10-CM | POA: Diagnosis not present

## 2016-05-09 DIAGNOSIS — R488 Other symbolic dysfunctions: Secondary | ICD-10-CM | POA: Diagnosis not present

## 2016-05-09 DIAGNOSIS — M6281 Muscle weakness (generalized): Secondary | ICD-10-CM | POA: Diagnosis not present

## 2016-05-09 DIAGNOSIS — R278 Other lack of coordination: Secondary | ICD-10-CM | POA: Diagnosis not present

## 2016-05-09 DIAGNOSIS — M545 Low back pain: Secondary | ICD-10-CM | POA: Diagnosis not present

## 2016-05-09 DIAGNOSIS — R262 Difficulty in walking, not elsewhere classified: Secondary | ICD-10-CM | POA: Diagnosis not present

## 2016-05-09 DIAGNOSIS — R296 Repeated falls: Secondary | ICD-10-CM | POA: Diagnosis not present

## 2016-05-10 DIAGNOSIS — M6281 Muscle weakness (generalized): Secondary | ICD-10-CM | POA: Diagnosis not present

## 2016-05-10 DIAGNOSIS — R296 Repeated falls: Secondary | ICD-10-CM | POA: Diagnosis not present

## 2016-05-10 DIAGNOSIS — R488 Other symbolic dysfunctions: Secondary | ICD-10-CM | POA: Diagnosis not present

## 2016-05-11 DIAGNOSIS — R488 Other symbolic dysfunctions: Secondary | ICD-10-CM | POA: Diagnosis not present

## 2016-05-11 DIAGNOSIS — R791 Abnormal coagulation profile: Secondary | ICD-10-CM | POA: Diagnosis not present

## 2016-05-11 DIAGNOSIS — Z7901 Long term (current) use of anticoagulants: Secondary | ICD-10-CM | POA: Diagnosis not present

## 2016-05-11 DIAGNOSIS — R296 Repeated falls: Secondary | ICD-10-CM | POA: Diagnosis not present

## 2016-05-11 DIAGNOSIS — M6281 Muscle weakness (generalized): Secondary | ICD-10-CM | POA: Diagnosis not present

## 2016-05-12 DIAGNOSIS — Z7901 Long term (current) use of anticoagulants: Secondary | ICD-10-CM | POA: Diagnosis not present

## 2016-05-15 DIAGNOSIS — R488 Other symbolic dysfunctions: Secondary | ICD-10-CM | POA: Diagnosis not present

## 2016-05-15 DIAGNOSIS — M6281 Muscle weakness (generalized): Secondary | ICD-10-CM | POA: Diagnosis not present

## 2016-05-15 DIAGNOSIS — R296 Repeated falls: Secondary | ICD-10-CM | POA: Diagnosis not present

## 2016-05-22 DIAGNOSIS — Z7901 Long term (current) use of anticoagulants: Secondary | ICD-10-CM | POA: Diagnosis not present

## 2016-05-23 DIAGNOSIS — N3281 Overactive bladder: Secondary | ICD-10-CM | POA: Diagnosis not present

## 2016-05-29 DIAGNOSIS — Z7901 Long term (current) use of anticoagulants: Secondary | ICD-10-CM | POA: Diagnosis not present

## 2016-05-31 DIAGNOSIS — H34832 Tributary (branch) retinal vein occlusion, left eye, with macular edema: Secondary | ICD-10-CM | POA: Diagnosis not present

## 2016-06-04 ENCOUNTER — Emergency Department (HOSPITAL_COMMUNITY): Payer: Medicare Other

## 2016-06-04 ENCOUNTER — Encounter (HOSPITAL_COMMUNITY): Payer: Self-pay | Admitting: Emergency Medicine

## 2016-06-04 ENCOUNTER — Inpatient Hospital Stay (HOSPITAL_COMMUNITY)
Admission: EM | Admit: 2016-06-04 | Discharge: 2016-06-09 | DRG: 480 | Disposition: A | Discharge status: Skilled Nursing Facility | Payer: Medicare Other | Attending: Family Medicine | Admitting: Family Medicine

## 2016-06-04 DIAGNOSIS — I2510 Atherosclerotic heart disease of native coronary artery without angina pectoris: Secondary | ICD-10-CM | POA: Diagnosis present

## 2016-06-04 DIAGNOSIS — T83098A Other mechanical complication of other indwelling urethral catheter, initial encounter: Secondary | ICD-10-CM | POA: Diagnosis not present

## 2016-06-04 DIAGNOSIS — S72001A Fracture of unspecified part of neck of right femur, initial encounter for closed fracture: Secondary | ICD-10-CM | POA: Diagnosis present

## 2016-06-04 DIAGNOSIS — I129 Hypertensive chronic kidney disease with stage 1 through stage 4 chronic kidney disease, or unspecified chronic kidney disease: Secondary | ICD-10-CM | POA: Diagnosis present

## 2016-06-04 DIAGNOSIS — M6281 Muscle weakness (generalized): Secondary | ICD-10-CM | POA: Diagnosis not present

## 2016-06-04 DIAGNOSIS — R32 Unspecified urinary incontinence: Secondary | ICD-10-CM | POA: Diagnosis present

## 2016-06-04 DIAGNOSIS — R05 Cough: Secondary | ICD-10-CM | POA: Diagnosis not present

## 2016-06-04 DIAGNOSIS — S72144D Nondisplaced intertrochanteric fracture of right femur, subsequent encounter for closed fracture with routine healing: Secondary | ICD-10-CM | POA: Diagnosis present

## 2016-06-04 DIAGNOSIS — N401 Enlarged prostate with lower urinary tract symptoms: Secondary | ICD-10-CM | POA: Diagnosis present

## 2016-06-04 DIAGNOSIS — R51 Headache: Secondary | ICD-10-CM | POA: Diagnosis not present

## 2016-06-04 DIAGNOSIS — Z681 Body mass index (BMI) 19 or less, adult: Secondary | ICD-10-CM

## 2016-06-04 DIAGNOSIS — Z8546 Personal history of malignant neoplasm of prostate: Secondary | ICD-10-CM | POA: Diagnosis not present

## 2016-06-04 DIAGNOSIS — Y92129 Unspecified place in nursing home as the place of occurrence of the external cause: Secondary | ICD-10-CM

## 2016-06-04 DIAGNOSIS — Z79899 Other long term (current) drug therapy: Secondary | ICD-10-CM

## 2016-06-04 DIAGNOSIS — K219 Gastro-esophageal reflux disease without esophagitis: Secondary | ICD-10-CM | POA: Diagnosis present

## 2016-06-04 DIAGNOSIS — I482 Chronic atrial fibrillation: Secondary | ICD-10-CM | POA: Diagnosis not present

## 2016-06-04 DIAGNOSIS — D62 Acute posthemorrhagic anemia: Secondary | ICD-10-CM | POA: Diagnosis not present

## 2016-06-04 DIAGNOSIS — S72141A Displaced intertrochanteric fracture of right femur, initial encounter for closed fracture: Secondary | ICD-10-CM | POA: Diagnosis not present

## 2016-06-04 DIAGNOSIS — N400 Enlarged prostate without lower urinary tract symptoms: Secondary | ICD-10-CM | POA: Diagnosis present

## 2016-06-04 DIAGNOSIS — F0280 Dementia in other diseases classified elsewhere without behavioral disturbance: Secondary | ICD-10-CM | POA: Diagnosis not present

## 2016-06-04 DIAGNOSIS — I10 Essential (primary) hypertension: Secondary | ICD-10-CM | POA: Diagnosis present

## 2016-06-04 DIAGNOSIS — N183 Chronic kidney disease, stage 3 unspecified: Secondary | ICD-10-CM | POA: Diagnosis present

## 2016-06-04 DIAGNOSIS — E43 Unspecified severe protein-calorie malnutrition: Secondary | ICD-10-CM | POA: Diagnosis present

## 2016-06-04 DIAGNOSIS — M542 Cervicalgia: Secondary | ICD-10-CM | POA: Diagnosis not present

## 2016-06-04 DIAGNOSIS — I481 Persistent atrial fibrillation: Secondary | ICD-10-CM | POA: Diagnosis not present

## 2016-06-04 DIAGNOSIS — R1312 Dysphagia, oropharyngeal phase: Secondary | ICD-10-CM | POA: Diagnosis not present

## 2016-06-04 DIAGNOSIS — R41841 Cognitive communication deficit: Secondary | ICD-10-CM | POA: Diagnosis not present

## 2016-06-04 DIAGNOSIS — N3289 Other specified disorders of bladder: Secondary | ICD-10-CM | POA: Diagnosis present

## 2016-06-04 DIAGNOSIS — Z7901 Long term (current) use of anticoagulants: Secondary | ICD-10-CM | POA: Diagnosis not present

## 2016-06-04 DIAGNOSIS — Z87891 Personal history of nicotine dependence: Secondary | ICD-10-CM

## 2016-06-04 DIAGNOSIS — S199XXA Unspecified injury of neck, initial encounter: Secondary | ICD-10-CM | POA: Diagnosis not present

## 2016-06-04 DIAGNOSIS — Z79891 Long term (current) use of opiate analgesic: Secondary | ICD-10-CM | POA: Diagnosis not present

## 2016-06-04 DIAGNOSIS — L8990 Pressure ulcer of unspecified site, unspecified stage: Secondary | ICD-10-CM | POA: Diagnosis present

## 2016-06-04 DIAGNOSIS — S0990XA Unspecified injury of head, initial encounter: Secondary | ICD-10-CM | POA: Diagnosis not present

## 2016-06-04 DIAGNOSIS — M25559 Pain in unspecified hip: Secondary | ICD-10-CM | POA: Diagnosis not present

## 2016-06-04 DIAGNOSIS — S72144S Nondisplaced intertrochanteric fracture of right femur, sequela: Secondary | ICD-10-CM | POA: Diagnosis present

## 2016-06-04 DIAGNOSIS — R262 Difficulty in walking, not elsewhere classified: Secondary | ICD-10-CM | POA: Diagnosis not present

## 2016-06-04 DIAGNOSIS — S7291XA Unspecified fracture of right femur, initial encounter for closed fracture: Secondary | ICD-10-CM | POA: Diagnosis not present

## 2016-06-04 DIAGNOSIS — D638 Anemia in other chronic diseases classified elsewhere: Secondary | ICD-10-CM | POA: Diagnosis not present

## 2016-06-04 DIAGNOSIS — H409 Unspecified glaucoma: Secondary | ICD-10-CM | POA: Diagnosis present

## 2016-06-04 DIAGNOSIS — Z96642 Presence of left artificial hip joint: Secondary | ICD-10-CM | POA: Diagnosis present

## 2016-06-04 DIAGNOSIS — S72114A Nondisplaced fracture of greater trochanter of right femur, initial encounter for closed fracture: Secondary | ICD-10-CM | POA: Diagnosis not present

## 2016-06-04 DIAGNOSIS — R059 Cough, unspecified: Secondary | ICD-10-CM

## 2016-06-04 DIAGNOSIS — I4891 Unspecified atrial fibrillation: Secondary | ICD-10-CM | POA: Diagnosis present

## 2016-06-04 DIAGNOSIS — T148XXA Other injury of unspecified body region, initial encounter: Secondary | ICD-10-CM | POA: Diagnosis not present

## 2016-06-04 DIAGNOSIS — M25551 Pain in right hip: Secondary | ICD-10-CM | POA: Diagnosis not present

## 2016-06-04 DIAGNOSIS — Z8673 Personal history of transient ischemic attack (TIA), and cerebral infarction without residual deficits: Secondary | ICD-10-CM | POA: Diagnosis not present

## 2016-06-04 DIAGNOSIS — W1830XA Fall on same level, unspecified, initial encounter: Secondary | ICD-10-CM | POA: Diagnosis present

## 2016-06-04 DIAGNOSIS — W19XXXA Unspecified fall, initial encounter: Secondary | ICD-10-CM

## 2016-06-04 DIAGNOSIS — S299XXA Unspecified injury of thorax, initial encounter: Secondary | ICD-10-CM | POA: Diagnosis not present

## 2016-06-04 DIAGNOSIS — G309 Alzheimer's disease, unspecified: Secondary | ICD-10-CM | POA: Diagnosis not present

## 2016-06-04 DIAGNOSIS — Z8781 Personal history of (healed) traumatic fracture: Secondary | ICD-10-CM

## 2016-06-04 DIAGNOSIS — Z4789 Encounter for other orthopedic aftercare: Secondary | ICD-10-CM | POA: Diagnosis present

## 2016-06-04 DIAGNOSIS — J449 Chronic obstructive pulmonary disease, unspecified: Secondary | ICD-10-CM | POA: Diagnosis not present

## 2016-06-04 DIAGNOSIS — S72009A Fracture of unspecified part of neck of unspecified femur, initial encounter for closed fracture: Secondary | ICD-10-CM | POA: Diagnosis not present

## 2016-06-04 HISTORY — DX: Other Alzheimer's disease: G30.8

## 2016-06-04 HISTORY — DX: Major depressive disorder, recurrent, unspecified: F33.9

## 2016-06-04 HISTORY — DX: Dementia in other diseases classified elsewhere, unspecified severity, without behavioral disturbance, psychotic disturbance, mood disturbance, and anxiety: F02.80

## 2016-06-04 HISTORY — DX: Chronic atrial fibrillation, unspecified: I48.20

## 2016-06-04 LAB — CBC WITH DIFFERENTIAL/PLATELET
Basophils Absolute: 0 10*3/uL (ref 0.0–0.1)
Basophils Relative: 0 %
EOS ABS: 0.2 10*3/uL (ref 0.0–0.7)
Eosinophils Relative: 3 %
HCT: 28.7 % — ABNORMAL LOW (ref 39.0–52.0)
Hemoglobin: 9.4 g/dL — ABNORMAL LOW (ref 13.0–17.0)
LYMPHS ABS: 0.8 10*3/uL (ref 0.7–4.0)
Lymphocytes Relative: 13 %
MCH: 29.7 pg (ref 26.0–34.0)
MCHC: 32.8 g/dL (ref 30.0–36.0)
MCV: 90.8 fL (ref 78.0–100.0)
MONO ABS: 0.6 10*3/uL (ref 0.1–1.0)
MONOS PCT: 9 %
NEUTROS PCT: 75 %
Neutro Abs: 4.4 10*3/uL (ref 1.7–7.7)
Platelets: 197 10*3/uL (ref 150–400)
RBC: 3.16 MIL/uL — ABNORMAL LOW (ref 4.22–5.81)
RDW: 15.8 % — ABNORMAL HIGH (ref 11.5–15.5)
WBC: 5.9 10*3/uL (ref 4.0–10.5)

## 2016-06-04 LAB — BASIC METABOLIC PANEL
Anion gap: 9 (ref 5–15)
BUN: 18 mg/dL (ref 6–20)
CO2: 24 mmol/L (ref 22–32)
CREATININE: 1.67 mg/dL — AB (ref 0.61–1.24)
Calcium: 9.2 mg/dL (ref 8.9–10.3)
Chloride: 104 mmol/L (ref 101–111)
GFR calc non Af Amer: 33 mL/min — ABNORMAL LOW (ref >60)
GFR, EST AFRICAN AMERICAN: 38 mL/min — AB (ref >60)
GLUCOSE: 124 mg/dL — AB (ref 65–99)
Potassium: 3.5 mmol/L (ref 3.5–5.1)
Sodium: 137 mmol/L (ref 135–145)

## 2016-06-04 LAB — PROTIME-INR
INR: 2.09
Prothrombin Time: 23.8 seconds — ABNORMAL HIGH (ref 11.4–15.2)

## 2016-06-04 MED ORDER — ALBUTEROL SULFATE (2.5 MG/3ML) 0.083% IN NEBU: 2.5000 mg | INHALATION_SOLUTION | Freq: Four times a day (QID) | RESPIRATORY_TRACT | Status: DC | PRN

## 2016-06-04 MED ORDER — TAMSULOSIN HCL 0.4 MG PO CAPS
0.4000 mg | ORAL_CAPSULE | Freq: Every evening | ORAL | Status: DC
  Administered 2016-06-04 – 2016-06-08 (×5): 0.4 mg via ORAL
  Filled 2016-06-04 (×4): qty 1

## 2016-06-04 MED ORDER — MORPHINE SULFATE (PF) 2 MG/ML IV SOLN: 0.5000 mg | INTRAVENOUS | Status: DC | PRN

## 2016-06-04 MED ORDER — FLUTICASONE PROPIONATE 50 MCG/ACT NA SUSP
2.0000 | Freq: Every day | NASAL | Status: DC
  Administered 2016-06-04 – 2016-06-08 (×4): 2 via NASAL
  Filled 2016-06-04: qty 16

## 2016-06-04 MED ORDER — DIPHENHYDRAMINE HCL 12.5 MG/5ML PO ELIX: 12.5000 mg | ORAL_SOLUTION | Freq: Three times a day (TID) | ORAL | Status: DC | PRN

## 2016-06-04 MED ORDER — POLYETHYLENE GLYCOL 3350 17 G PO PACK
17.0000 g | PACK | Freq: Two times a day (BID) | ORAL | Status: DC
  Administered 2016-06-04 – 2016-06-09 (×9): 17 g via ORAL
  Filled 2016-06-04 (×8): qty 1

## 2016-06-04 MED ORDER — LATANOPROST 0.005 % OP SOLN
1.0000 [drp] | Freq: Every day | OPHTHALMIC | Status: DC
  Administered 2016-06-04 – 2016-06-08 (×4): 1 [drp] via OPHTHALMIC
  Filled 2016-06-04: qty 2.5

## 2016-06-04 MED ORDER — HYDRALAZINE HCL 50 MG PO TABS
50.0000 mg | ORAL_TABLET | Freq: Two times a day (BID) | ORAL | Status: DC
  Administered 2016-06-04 – 2016-06-06 (×3): 50 mg via ORAL
  Filled 2016-06-04 (×3): qty 1

## 2016-06-04 MED ORDER — ONDANSETRON HCL 4 MG/2ML IJ SOLN
4.0000 mg | Freq: Once | INTRAMUSCULAR | Status: AC
  Administered 2016-06-04: 4 mg via INTRAVENOUS
  Filled 2016-06-04: qty 2

## 2016-06-04 MED ORDER — MORPHINE SULFATE (PF) 2 MG/ML IV SOLN
0.5000 mg | INTRAVENOUS | Status: DC | PRN
  Administered 2016-06-05: 1 mg via INTRAVENOUS
  Filled 2016-06-04: qty 1

## 2016-06-04 MED ORDER — BRIMONIDINE TARTRATE 0.2 % OP SOLN
1.0000 [drp] | Freq: Two times a day (BID) | OPHTHALMIC | Status: DC
  Administered 2016-06-04 – 2016-06-09 (×10): 1 [drp] via OPHTHALMIC
  Filled 2016-06-04: qty 5

## 2016-06-04 MED ORDER — ISOSORB DINITRATE-HYDRALAZINE 20-37.5 MG PO TABS
1.0000 | ORAL_TABLET | Freq: Two times a day (BID) | ORAL | Status: DC
  Administered 2016-06-04 – 2016-06-09 (×8): 1 via ORAL
  Filled 2016-06-04 (×11): qty 1

## 2016-06-04 MED ORDER — BRIMONIDINE TARTRATE-TIMOLOL 0.2-0.5 % OP SOLN: 1.0000 [drp] | Freq: Two times a day (BID) | OPHTHALMIC | Status: DC

## 2016-06-04 MED ORDER — TIMOLOL MALEATE 0.5 % OP SOLN
1.0000 [drp] | Freq: Two times a day (BID) | OPHTHALMIC | Status: DC
  Administered 2016-06-04 – 2016-06-09 (×10): 1 [drp] via OPHTHALMIC
  Filled 2016-06-04: qty 5

## 2016-06-04 MED ORDER — MIRABEGRON ER 25 MG PO TB24
25.0000 mg | ORAL_TABLET | Freq: Every day | ORAL | Status: DC
  Administered 2016-06-05 – 2016-06-09 (×5): 25 mg via ORAL
  Filled 2016-06-04 (×5): qty 1

## 2016-06-04 MED ORDER — CARVEDILOL 12.5 MG PO TABS
12.5000 mg | ORAL_TABLET | Freq: Two times a day (BID) | ORAL | Status: DC
Start: 2016-06-05 — End: 2016-06-09
  Administered 2016-06-05 – 2016-06-09 (×8): 12.5 mg via ORAL
  Filled 2016-06-04 (×8): qty 1

## 2016-06-04 MED ORDER — DONEPEZIL HCL 10 MG PO TABS
10.0000 mg | ORAL_TABLET | Freq: Every day | ORAL | Status: DC
  Administered 2016-06-04 – 2016-06-08 (×5): 10 mg via ORAL
  Filled 2016-06-04 (×5): qty 1

## 2016-06-04 MED ORDER — MORPHINE SULFATE (PF) 4 MG/ML IV SOLN
4.0000 mg | INTRAVENOUS | Status: DC | PRN
  Administered 2016-06-04: 4 mg via INTRAVENOUS
  Filled 2016-06-04: qty 1

## 2016-06-04 MED ORDER — VITAMIN K1 10 MG/ML IJ SOLN
5.0000 mg | Freq: Once | INTRAVENOUS | Status: AC
  Administered 2016-06-04: 5 mg via INTRAVENOUS
  Filled 2016-06-04: qty 0.5

## 2016-06-04 MED ORDER — DULOXETINE HCL 30 MG PO CPEP
30.0000 mg | ORAL_CAPSULE | Freq: Every day | ORAL | Status: DC
  Administered 2016-06-05 – 2016-06-09 (×5): 30 mg via ORAL
  Filled 2016-06-04 (×5): qty 1

## 2016-06-04 MED ORDER — FAMOTIDINE 20 MG PO TABS
20.0000 mg | ORAL_TABLET | Freq: Every day | ORAL | Status: DC
  Administered 2016-06-05 – 2016-06-09 (×5): 20 mg via ORAL
  Filled 2016-06-04 (×4): qty 1

## 2016-06-04 MED ORDER — HYDROCODONE-ACETAMINOPHEN 5-325 MG PO TABS
1.0000 | ORAL_TABLET | Freq: Four times a day (QID) | ORAL | Status: DC | PRN
  Administered 2016-06-04: 2 via ORAL
  Filled 2016-06-04: qty 2

## 2016-06-04 MED ORDER — ENSURE ENLIVE PO LIQD
237.0000 mL | Freq: Three times a day (TID) | ORAL | Status: DC
  Administered 2016-06-06 – 2016-06-09 (×8): 237 mL via ORAL

## 2016-06-04 MED ORDER — MORPHINE SULFATE (PF) 4 MG/ML IV SOLN
4.0000 mg | Freq: Once | INTRAVENOUS | Status: AC
  Administered 2016-06-04: 4 mg via INTRAVENOUS
  Filled 2016-06-04: qty 1

## 2016-06-04 MED ORDER — AMLODIPINE BESYLATE 10 MG PO TABS
10.0000 mg | ORAL_TABLET | Freq: Every day | ORAL | Status: DC
  Administered 2016-06-05 – 2016-06-09 (×5): 10 mg via ORAL
  Filled 2016-06-04 (×4): qty 1

## 2016-06-04 NOTE — ED Notes (Signed)
Admitting MD at bedside.

## 2016-06-04 NOTE — ED Triage Notes (Signed)
Per [facility] patient from facility [facility] assisted living after falling.  Patient was trying to sit on his recliner when he missed the seat and fell on to his right side.  Patient complains of right hip pain, patient has sheet tied around hip to stabilize, patient states pain is reduced with this.  Patient with hx of dementia alert and oriented to self, situation, and location.  Dorsalis pedis pulse palpated in right foot.

## 2016-06-04 NOTE — H&P (Signed)
History and Physical    Kurt Thomas [id number] DOB: February 20, 1919 DOA: 06/04/2016  Referring MD/NP/PA:  Dr. Rex Kras PCP: Melinda Crutch, MD  Patient coming from: [facility] assisted living  Chief Complaint:  Fall  HPI: Kurt Thomas is a 82 y.o. male with medical history significant of HTN, CVA, afib on chronic afib, and Alz dementia;  who presents for complaints of right hip pain after fall. History is obtained from the patient and his daughter who is present at bedside. Her at baseline the patient uses a walker to ambulate and has dementia, but is able to express how he is feeling without issues.  He had been trying to sit down in his recliner  When he missed the chair falling onto his right side on the floor. Patient complains of severe right hip pain that somewhat improved after she had around his hip. Patient denies any shortness of breath, chest pain, headache, fever, abdominal pain, nausea, vomiting, diarrhea. Patient had similar incident happened when he fell and fractured his left hip 2 years ago, but recovered very quickly.  ED Course:  Upon admission to the emergency department patient was evaluated and seen to be afebrile with vitals relatively within normal limits except for some O2 sats in the upper 80s after pain medications given. Lab work revealed INR of 2.24 and otherwise appeared near his baseline. Dr. Ninfa Linden of orthopedics evaluated the patient and recommended giving the patient vitamin K, and allowing him to eat for now as surgery would be at the earliest late tomorrow.  Review of Systems: As per HPI otherwise 10 point review of systems negative.   Past Medical History:  Diagnosis Date  . Alzheimer's disease, focal onset   . Atrial fibrillation ([place])   . CAD (coronary artery disease)   . Chronic atrial fibrillation ([place])   . High blood pressure   . Low back pain   . Memory loss   . Prostate cancer ([place])   . Recurrent major depression ([place])   . Stroke [facility])     Past  Surgical History:  Procedure Laterality Date  . CATARACT EXTRACTION    . CORONARY ARTERY BYPASS GRAFT    . HIP ARTHROPLASTY Left 06/22/2014   Procedure: LEFT HEMIARTHROPLASTY HIP;  Surgeon: Meredith Pel, MD;  Location: [facility] ORS;  Service: Orthopedics;  Laterality: Left;  . PROSTATECTOMY       reports that he quit smoking about 59 years ago. His smoking use included Cigarettes. He has a 1.00 pack-year smoking history. He has never used smokeless tobacco. He reports that he does not drink alcohol or use drugs.  No Known Allergies  Family History  Problem Relation Age of Onset  . Cancer Father   . High blood pressure      Prior to Admission medications   Medication Sig Start Date End Date Taking? Authorizing Provider  acetaminophen (TYLENOL) 500 MG tablet Take 1,000 mg by mouth every 8 (eight) hours as needed (pain). Maximum 3 tablets in 24 hours   Yes Historical Provider, MD  albuterol (PROVENTIL) (2.5 MG/3ML) 0.083% nebulizer solution Take 2.5 mg by nebulization every 6 (six) hours as needed for wheezing.    Yes Historical Provider, MD  amLODipine (NORVASC) 10 MG tablet Take 1 tablet (10 mg total) by mouth daily. 09/08/14  Yes Oswald Hillock, MD  bimatoprost (LUMIGAN) 0.01 % SOLN Place 1 drop into both eyes at bedtime.   Yes Historical Provider, MD  brimonidine-timolol (COMBIGAN) 0.2-0.5 % ophthalmic solution Place 1 drop  into both eyes every 12 (twelve) hours.   Yes Historical Provider, MD  carvedilol (COREG) 12.5 MG tablet Take 12.5 mg by mouth 2 (two) times daily with a meal.   Yes Historical Provider, MD  diphenhydrAMINE (BENYLIN) 12.5 MG/5ML syrup Take 12.5 mg by mouth 3 (three) times daily as needed for itching.   Yes Historical Provider, MD  donepezil (ARICEPT) 10 MG tablet Take 10 mg by mouth at bedtime.   Yes Historical Provider, MD  DULoxetine (CYMBALTA) 30 MG capsule Take 30 mg by mouth daily.   Yes Historical Provider, MD  Fe Fum-FA-B Cmp-C-Zn-Mg-Mn-Cu (CENTRATEX) 106-1 MG  CAPS Take 1 capsule by mouth daily.   Yes Historical Provider, MD  fluticasone (FLONASE) 50 MCG/ACT nasal spray Place 2 sprays into both nostrils at bedtime.   Yes Historical Provider, MD  hydrALAZINE (APRESOLINE) 50 MG tablet Take 50 mg by mouth 2 (two) times daily.   Yes Historical Provider, MD  isosorbide-hydrALAZINE (BIDIL) 20-37.5 MG per tablet Take 1 tablet by mouth 2 (two) times daily. 06/24/14  Yes Barton Dubois, MD  loperamide (IMODIUM) 2 MG capsule Take 2 mg by mouth every 6 (six) hours as needed for diarrhea or loose stools.   Yes Historical Provider, MD  mirabegron ER (MYRBETRIQ) 25 MG TB24 tablet Take 25 mg by mouth daily.   Yes Historical Provider, MD  Nutritional Supplements (ENSURE PO) Take 237 mLs by mouth 3 (three) times daily between meals.   Yes Historical Provider, MD  ondansetron (ZOFRAN) 4 MG tablet Take 4 mg by mouth every 8 (eight) hours as needed for nausea or vomiting. Maximum 3 tablets in 25 hours   Yes Historical Provider, MD  polyethylene glycol (MIRALAX / GLYCOLAX) packet Take 17 g by mouth 2 (two) times daily. Mix in 8 oz liquid and drink   Yes Historical Provider, MD  ranitidine (ZANTAC) 150 MG tablet Take 150 mg by mouth at bedtime.   Yes Historical Provider, MD  tamsulosin (FLOMAX) 0.4 MG CAPS capsule Take 0.4 mg by mouth every evening. 5pm   Yes Historical Provider, MD  warfarin (COUMADIN) 5 MG tablet Take 1.5 tablets (7.5 mg total) by mouth daily. To be taken for the next 2 days; then will require INR level and further coumadin adjustments. Patient taking differently: Take 2.5 mg by mouth daily at 6 PM.  06/24/14  Yes Barton Dubois, MD  docusate sodium (COLACE) 100 MG capsule Take 1 capsule (100 mg total) by mouth daily as needed for mild constipation. Patient not taking: Reported on 06/04/2016 09/08/14   Oswald Hillock, MD  DULoxetine (CYMBALTA) 20 MG capsule Take 1 capsule (20 mg total) by mouth daily. Patient not taking: Reported on 06/04/2016 05/07/14   Marcial Pacas,  MD  feeding supplement, ENSURE COMPLETE, (ENSURE COMPLETE) LIQD Take 237 mLs by mouth 2 (two) times daily between meals. Patient not taking: Reported on 06/04/2016 09/08/14   Oswald Hillock, MD  Ferrous Fumarate 150 MG TABS Take 1 tablet (150 mg total) by mouth 2 (two) times daily. Patient not taking: Reported on 06/04/2016 06/24/14   Barton Dubois, MD  HYDROcodone-acetaminophen (NORCO/VICODIN) 5-325 MG per tablet Take 1 tablet by mouth every 6 (six) hours as needed for severe pain. Patient not taking: Reported on 06/04/2016 09/08/14   Oswald Hillock, MD    Physical Exam:  Constitutional: Elderly male who appears younger than stated age, NAD, calm, comfortable,and able to follow commands. Vitals:   06/04/16 1900 06/04/16 1930 06/04/16 2000 06/04/16 2100  BP: Marland Kitchen)  109/46 (!) 111/51 111/67 126/62  Pulse: 76 73 84 82  Resp:    17  Temp:      TempSrc:      SpO2: (!) 88% 92% 92% 94%   Eyes: PERRL, lids and conjunctivae normal ENMT: Mucous membranes are dry. Posterior pharynx clear of any exudate or lesions. Neck: normal, supple, no masses, no thyromegaly Respiratory: clear to auscultation bilaterally, no wheezing, no crackles. Normal respiratory effort. No accessory muscle use.  Cardiovascular: Irregular irregular, rubs / gallops. No extremity edema. 2+ pedal pulses. No carotid bruits.  Abdomen: no tenderness, no masses palpated. No hepatosplenomegaly. Bowel sounds positive.  Musculoskeletal: no clubbing / cyanosis. Right leg externally rotated and shortened. Pain with any manipulation. Skin: no rashes, lesions, ulcers. No induration Neurologic: CN 2-12 grossly intact. Sensation intact, DTR normal. Strength 5/5 in all 4.  Psychiatric:  Alert and oriented x 2. Normal mood.     Labs on Admission: I have personally reviewed following labs and imaging studies  CBC:  Recent Labs Lab 06/04/16 1748  WBC 5.9  NEUTROABS 4.4  HGB 9.4*  HCT 28.7*  MCV 90.8  PLT [id number]   Basic Metabolic  Panel:  Recent Labs Lab 06/04/16 1748  NA 137  K 3.5  CL 104  CO2 24  GLUCOSE 124*  BUN 18  CREATININE 1.67*  CALCIUM 9.2   GFR: CrCl cannot be calculated (Unknown ideal weight.). Liver Function Tests: No results for input(s): AST, ALT, ALKPHOS, BILITOT, PROT, ALBUMIN in the last 168 hours. No results for input(s): LIPASE, AMYLASE in the last 168 hours. No results for input(s): AMMONIA in the last 168 hours. Coagulation Profile:  Recent Labs Lab 06/04/16 1748  INR 2.09   Cardiac Enzymes: No results for input(s): CKTOTAL, CKMB, CKMBINDEX, TROPONINI in the last 168 hours. BNP (last 3 results) No results for input(s): PROBNP in the last 8760 hours. HbA1C: No results for input(s): HGBA1C in the last 72 hours. CBG: No results for input(s): GLUCAP in the last 168 hours. Lipid Profile: No results for input(s): CHOL, HDL, LDLCALC, TRIG, CHOLHDL, LDLDIRECT in the last 72 hours. Thyroid Function Tests: No results for input(s): TSH, T4TOTAL, FREET4, T3FREE, THYROIDAB in the last 72 hours. Anemia Panel: No results for input(s): VITAMINB12, FOLATE, FERRITIN, TIBC, IRON, RETICCTPCT in the last 72 hours. Urine analysis:    Component Value Date/Time   COLORURINE YELLOW 09/03/2014 2025   APPEARANCEUR CLEAR 09/03/2014 2025   LABSPEC 1.016 09/03/2014 2025   PHURINE 6.5 09/03/2014 2025   GLUCOSEU NEGATIVE 09/03/2014 2025   HGBUR NEGATIVE 09/03/2014 2025   BILIRUBINUR NEGATIVE 09/03/2014 2025   KETONESUR NEGATIVE 09/03/2014 2025   PROTEINUR 30 (A) 09/03/2014 2025   UROBILINOGEN 0.2 09/03/2014 2025   NITRITE NEGATIVE 09/03/2014 2025   LEUKOCYTESUR NEGATIVE 09/03/2014 2025   Sepsis Labs: No results found for this or any previous visit (from the past 240 hour(s)).   Radiological Exams on Admission: Dg Chest 1 View  Result Date: 06/04/2016 CLINICAL DATA:  Fall, right hip pain, right femur fracture, initial encounter. EXAM: CHEST 1 VIEW COMPARISON:  06/20/2014. FINDINGS: Trachea  is midline. Heart is enlarged. Devitalized wires overlie the heart. Mild patchy opacification in the lateral right midlung zone is new. Calcified granuloma in the left midlung zone. Lungs are otherwise clear. No pleural fluid. IMPRESSION: Mild patchy opacification in the lateral right midlung zone may be due to contusion and/or rib fractures. Electronically Signed   By: Lorin Picket M.D.   On: 06/04/2016 19:06  Ct Head Wo Contrast  Result Date: 06/04/2016 CLINICAL DATA:  Patient status post fall. No reported loss of consciousness. Head neck pain. EXAM: CT HEAD WITHOUT CONTRAST CT CERVICAL SPINE WITHOUT CONTRAST TECHNIQUE: Multidetector CT imaging of the head and cervical spine was performed following the standard protocol without intravenous contrast. Multiplanar CT image reconstructions of the cervical spine were also generated. COMPARISON:  Brain CT 03/21/2016. FINDINGS: CT HEAD FINDINGS Brain: Ventricles and sulci are prominent compatible with atrophy. Periventricular and subcortical white matter hypodensity compatible with chronic microvascular ischemic changes. Additionally encephalomalacia is demonstrated throughout the right cerebral hemisphere compatible with remote infarct. Old right cerebellar hemisphere infarct. No evidence for acute cortically based infarct, intracranial hemorrhage, mass lesion or mass-effect. Old left basal ganglia lacunar infarct. Vascular: Internal carotid arterial calcifications Skull: Intact. Sinuses/Orbits: Paranasal sinuses are well aerated. Mastoid air cells are unremarkable. Other: None. CT CERVICAL SPINE FINDINGS Alignment: Normal anatomic alignment. Skull base and vertebrae: Irregular lucency through the right aspect of the C1 ring (image 17; series 7). Soft tissues and spinal canal: Unremarkable. Disc levels: Multilevel degenerative disc disease most pronounced C3-4, C4-5 and C5-6. No acute cervical spine fracture. Upper chest: No consolidative pulmonary opacities.  There is a 1.9 cm nodule within the thyroid (image 85; series 6). Other: None. IMPRESSION: Irregular lucency through the posterior right aspect of the C1 ring likely sequelae of remote trauma or potentially secondary to a lytic lesion in the setting of metastatic disease, felt to be less likely. No acute intracranial process. No definite acute cervical spine fracture. Cortical atrophy and old cortically based infarcts. Electronically Signed   By: Lovey Newcomer M.D.   On: 06/04/2016 18:53   Ct Cervical Spine Wo Contrast  Result Date: 06/04/2016 CLINICAL DATA:  Patient status post fall. No reported loss of consciousness. Head neck pain. EXAM: CT HEAD WITHOUT CONTRAST CT CERVICAL SPINE WITHOUT CONTRAST TECHNIQUE: Multidetector CT imaging of the head and cervical spine was performed following the standard protocol without intravenous contrast. Multiplanar CT image reconstructions of the cervical spine were also generated. COMPARISON:  Brain CT 03/21/2016. FINDINGS: CT HEAD FINDINGS Brain: Ventricles and sulci are prominent compatible with atrophy. Periventricular and subcortical white matter hypodensity compatible with chronic microvascular ischemic changes. Additionally encephalomalacia is demonstrated throughout the right cerebral hemisphere compatible with remote infarct. Old right cerebellar hemisphere infarct. No evidence for acute cortically based infarct, intracranial hemorrhage, mass lesion or mass-effect. Old left basal ganglia lacunar infarct. Vascular: Internal carotid arterial calcifications Skull: Intact. Sinuses/Orbits: Paranasal sinuses are well aerated. Mastoid air cells are unremarkable. Other: None. CT CERVICAL SPINE FINDINGS Alignment: Normal anatomic alignment. Skull base and vertebrae: Irregular lucency through the right aspect of the C1 ring (image 17; series 7). Soft tissues and spinal canal: Unremarkable. Disc levels: Multilevel degenerative disc disease most pronounced C3-4, C4-5 and C5-6. No  acute cervical spine fracture. Upper chest: No consolidative pulmonary opacities. There is a 1.9 cm nodule within the thyroid (image 85; series 6). Other: None. IMPRESSION: Irregular lucency through the posterior right aspect of the C1 ring likely sequelae of remote trauma or potentially secondary to a lytic lesion in the setting of metastatic disease, felt to be less likely. No acute intracranial process. No definite acute cervical spine fracture. Cortical atrophy and old cortically based infarcts. Electronically Signed   By: Lovey Newcomer M.D.   On: 06/04/2016 18:53   Dg Hip Unilat With Pelvis 2-3 Views Right  Result Date: 06/04/2016 CLINICAL DATA:  Fall onto right side, right hip  pain, initial encounter. EXAM: DG HIP (WITH OR WITHOUT PELVIS) 2-3V RIGHT COMPARISON:  The the FINDINGS: There is a comminuted intertrochanteric fracture of the right femur with varus angulation. No dislocation. Left hip arthroplasty. Surgical clips in the anatomic pelvis. IMPRESSION: Intertrochanteric right femur fracture. Electronically Signed   By: Lorin Picket M.D.   On: 06/04/2016 19:04    EKG: Independently reviewed. A. fib  Assessment/Plan Closed right femur fracture 2/2 fall: Acute. Patient seen have right intertrochanteric femur fracture on x-ray imaging after a fall at nursing facility. Dr. Ninfa Linden of orthopedics consulted and evaluated patient. - Admit to MedSurg bed - Hip fracture protocol initiated - Check EKG - Social work/care management consults for hip fracture and likely need of rehabilitation - Hydrocodone/ morphine prn - Heart heart healthy diet, for now - [place] consultative services, will follow-up for further recommendations - Daughter(POA) would like to be contacted prior to he surgical procedure possibly so that she can be present.  Atrial fibrillation on chronic anticoagulation therapy: Heart rates well controlled at this time. Initial INR noted to be 2.24. Patient given  5  mg of vitamin K in the ED. - Hold warfarin - Recheck PT/INR in a.m. - Restart warfarin when given okay following surgical procedure  Essential hypertension - Continue hydralazine, Coreg, BiDil, and amlodipine  - Question double hydralazine  Chronic kidney disease stage III: Stable. - Continue to monitor   Dementia - Continue Aricept  Glaucoma - Continue latanpoprost, Combigan   BPH - Continue Flomax  GERD - pharmacy substitution of Pepcid for ranitidine  DVT prophylaxis:  None ordered Code Status:  Full Family Communication: Discussed overall plan of care with patient and daughter at bedside Disposition Plan:  Likely discharge to SNF Consults called: Orthopedics Admission status: Inpatient MedSurg  Norval Morton MD [facility] Pager [phone]  If 7PM-7AM, please contact night-coverage [email or web address] Password [email or web address]  06/04/2016, 9:50 PM

## 2016-06-04 NOTE — Progress Notes (Signed)
Patient ID: Kurt Thomas, male   DOB: 07/07/1919, 82 y.o.   MRN: [id number] I have reviewed the x-rays of Kurt Thomas' right hip.  He has a right intertrochanteric femur fracture.  The usual treatment is a IM rod/hip screw construct thru small incisions to address the fracture.  He has dementia and is also on Coumadin with an INR > 2.  The EDP said she would contact the next-of-kin or healthcare POA to see what their wishes are.  I did ask her to give a dose of vitamin K.  Will see in the am.  Can eat for now.  Surgery at the earliest will be late tomorrow.

## 2016-06-04 NOTE — ED Provider Notes (Signed)
[facility] DEPT Provider Note   CSN: [id number] Arrival date & time: 06/04/16  1708     History   Chief Complaint Chief Complaint  Patient presents with  . Fall    HPI Kurt Thomas is a 82 y.o. male.  82 year old male with past medical history including dementia, CAD, chronic atrial fibrillation on Coumadin who presents with fall and right hip pain. History obtained from EMS, who reported that the patient was at his assisted living facility today and had a fall just prior to arrival. He was trying to sit in his recliner when he missed the seat and fell onto his right side. He has had severe right hip pain since that time. Pain has been improved when they tied a sheet around his hip. The patient denies any pain other than in his right hip.  LEVEL 5 CAVEAT DUE TO DEMENTIA   The history is provided by the EMS personnel.  Fall     Past Medical History:  Diagnosis Date  . Alzheimer's disease, focal onset   . Atrial fibrillation ([place])   . CAD (coronary artery disease)   . Chronic atrial fibrillation ([place])   . High blood pressure   . Low back pain   . Memory loss   . Prostate cancer ([place])   . Recurrent major depression ([place])   . Stroke [facility])     Patient Active Problem List   Diagnosis Date Noted  . Protein-calorie malnutrition, severe ([place]) 09/05/2014  . PVC's (premature ventricular contractions)   . CKD (chronic kidney disease) stage 3, GFR 30-59 ml/min 09/03/2014  . Acute encephalopathy 09/03/2014  . Vomiting 09/03/2014  . Difficulty urinating 09/03/2014  . CAD (coronary artery disease)   . Essential hypertension 06/22/2014  . Atrial fibrillation ([place]) 06/21/2014  . Hip fracture ([place]) 06/20/2014  . Renal insufficiency 06/20/2014  . Closed left hip fracture ([place])   . Lumbar stenosis 05/07/2014  . Low back pain 03/31/2014  . Gait difficulty 03/31/2014  . High blood pressure     Past Surgical History:  Procedure Laterality Date  . CATARACT EXTRACTION    .  CORONARY ARTERY BYPASS GRAFT    . HIP ARTHROPLASTY Left 06/22/2014   Procedure: LEFT HEMIARTHROPLASTY HIP;  Surgeon: Meredith Pel, MD;  Location: [facility] ORS;  Service: Orthopedics;  Laterality: Left;  . PROSTATECTOMY         Home Medications    Prior to Admission medications   Medication Sig Start Date End Date Taking? Authorizing Provider  albuterol (PROVENTIL) (2.5 MG/3ML) 0.083% nebulizer solution Take 2.5 mg by nebulization every 6 (six) hours as needed for wheezing or shortness of breath.    Historical Provider, MD  amLODipine (NORVASC) 10 MG tablet Take 1 tablet (10 mg total) by mouth daily. 09/08/14   Oswald Hillock, MD  carvedilol (COREG) 12.5 MG tablet Take 12.5 mg by mouth 2 (two) times daily with a meal.    Historical Provider, MD  docusate sodium (COLACE) 100 MG capsule Take 1 capsule (100 mg total) by mouth daily as needed for mild constipation. 09/08/14   Oswald Hillock, MD  donepezil (ARICEPT) 10 MG tablet Take 10 mg by mouth at bedtime.    Historical Provider, MD  DULoxetine (CYMBALTA) 20 MG capsule Take 1 capsule (20 mg total) by mouth daily. 05/07/14   Marcial Pacas, MD  feeding supplement, ENSURE COMPLETE, (ENSURE COMPLETE) LIQD Take 237 mLs by mouth 2 (two) times daily between meals. 09/08/14   Oswald Hillock, MD  Ferrous Fumarate 150 MG TABS Take 1 tablet (150 mg total) by mouth 2 (two) times daily. 06/24/14   Barton Dubois, MD  hydrALAZINE (APRESOLINE) 25 MG tablet Take 50 mg by mouth 2 (two) times daily.    Historical Provider, MD  HYDROcodone-acetaminophen (NORCO/VICODIN) 5-325 MG per tablet Take 1 tablet by mouth every 6 (six) hours as needed for severe pain. 09/08/14   Oswald Hillock, MD  isosorbide-hydrALAZINE (BIDIL) 20-37.5 MG per tablet Take 1 tablet by mouth 2 (two) times daily. Patient not taking: Reported on 09/04/2014 06/24/14   Barton Dubois, MD  polyethylene glycol [facility] / Floria Raveling) packet Take 17 g by mouth 2 (two) times daily.    Historical Provider, MD  ranitidine  (ZANTAC) 150 MG capsule Take 150 mg by mouth 2 (two) times daily.     Historical Provider, MD  warfarin (COUMADIN) 5 MG tablet Take 1.5 tablets (7.5 mg total) by mouth daily. To be taken for the next 2 days; then will require INR level and further coumadin adjustments. Patient taking differently: Take 2.5-5 mg by mouth every evening. 5 mg daily except 2.5 mg on Mon, Wed, Fri 06/24/14   Barton Dubois, MD    Family History Family History  Problem Relation Age of Onset  . Cancer Father   . High blood pressure      Social History Social History  Substance Use Topics  . Smoking status: Former Smoker    Packs/day: 1.00    Years: 1.00    Types: Cigarettes    Quit date: 07/10/1956  . Smokeless tobacco: Never Used  . Alcohol use No     Allergies   Patient has no known allergies.   Review of Systems Review of Systems  Unable to perform ROS: Dementia     Physical Exam Updated Vital Signs BP 111/67   Pulse 84   Temp 97.7 F (36.5 C) (Oral)   Resp 14   SpO2 92%   Physical Exam  Constitutional: He is oriented to person, place, and time. No distress.  Frail, elderly man  HENT:  Head: Normocephalic and atraumatic.  Moist mucous membranes  Eyes: Conjunctivae are normal. Pupils are equal, round, and reactive to light.  Neck: Neck supple.  Cardiovascular: Normal rate, regular rhythm, normal heart sounds and intact distal pulses.   No murmur heard. Pulmonary/Chest: Effort normal and breath sounds normal. He exhibits no tenderness.  Abdominal: Soft. Bowel sounds are normal. He exhibits no distension. There is no tenderness.  Musculoskeletal: He exhibits tenderness and deformity.  R leg shortened and externally rotated, severe pain w/ minimal movement  Neurological: He is alert and oriented to person, place, and time. No sensory deficit.  Fluent speech  Skin: Skin is warm and dry.  Nursing note and vitals reviewed.    ED Treatments / Results  Labs (all labs ordered are  listed, but only abnormal results are displayed) Labs Reviewed  BASIC METABOLIC PANEL - Abnormal; Notable for the following:       Result Value   Glucose, Bld 124 (*)    Creatinine, Ser 1.67 (*)    GFR calc non Af Amer 33 (*)    GFR calc Af Amer 38 (*)    All other components within normal limits  CBC WITH DIFFERENTIAL/PLATELET - Abnormal; Notable for the following:    RBC 3.16 (*)    Hemoglobin 9.4 (*)    HCT 28.7 (*)    RDW 15.8 (*)    All other components within normal  limits  PROTIME-INR - Abnormal; Notable for the following:    Prothrombin Time 23.8 (*)    All other components within normal limits    EKG  EKG Interpretation None       Radiology Dg Chest 1 View  Result Date: 06/04/2016 CLINICAL DATA:  Fall, right hip pain, right femur fracture, initial encounter. EXAM: CHEST 1 VIEW COMPARISON:  06/20/2014. FINDINGS: Trachea is midline. Heart is enlarged. Devitalized wires overlie the heart. Mild patchy opacification in the lateral right midlung zone is new. Calcified granuloma in the left midlung zone. Lungs are otherwise clear. No pleural fluid. IMPRESSION: Mild patchy opacification in the lateral right midlung zone may be due to contusion and/or rib fractures. Electronically Signed   By: Lorin Picket M.D.   On: 06/04/2016 19:06   Ct Head Wo Contrast  Result Date: 06/04/2016 CLINICAL DATA:  Patient status post fall. No reported loss of consciousness. Head neck pain. EXAM: CT HEAD WITHOUT CONTRAST CT CERVICAL SPINE WITHOUT CONTRAST TECHNIQUE: Multidetector CT imaging of the head and cervical spine was performed following the standard protocol without intravenous contrast. Multiplanar CT image reconstructions of the cervical spine were also generated. COMPARISON:  Brain CT 03/21/2016. FINDINGS: CT HEAD FINDINGS Brain: Ventricles and sulci are prominent compatible with atrophy. Periventricular and subcortical white matter hypodensity compatible with chronic microvascular  ischemic changes. Additionally encephalomalacia is demonstrated throughout the right cerebral hemisphere compatible with remote infarct. Old right cerebellar hemisphere infarct. No evidence for acute cortically based infarct, intracranial hemorrhage, mass lesion or mass-effect. Old left basal ganglia lacunar infarct. Vascular: Internal carotid arterial calcifications Skull: Intact. Sinuses/Orbits: Paranasal sinuses are well aerated. Mastoid air cells are unremarkable. Other: None. CT CERVICAL SPINE FINDINGS Alignment: Normal anatomic alignment. Skull base and vertebrae: Irregular lucency through the right aspect of the C1 ring (image 17; series 7). Soft tissues and spinal canal: Unremarkable. Disc levels: Multilevel degenerative disc disease most pronounced C3-4, C4-5 and C5-6. No acute cervical spine fracture. Upper chest: No consolidative pulmonary opacities. There is a 1.9 cm nodule within the thyroid (image 85; series 6). Other: None. IMPRESSION: Irregular lucency through the posterior right aspect of the C1 ring likely sequelae of remote trauma or potentially secondary to a lytic lesion in the setting of metastatic disease, felt to be less likely. No acute intracranial process. No definite acute cervical spine fracture. Cortical atrophy and old cortically based infarcts. Electronically Signed   By: Lovey Newcomer M.D.   On: 06/04/2016 18:53   Ct Cervical Spine Wo Contrast  Result Date: 06/04/2016 CLINICAL DATA:  Patient status post fall. No reported loss of consciousness. Head neck pain. EXAM: CT HEAD WITHOUT CONTRAST CT CERVICAL SPINE WITHOUT CONTRAST TECHNIQUE: Multidetector CT imaging of the head and cervical spine was performed following the standard protocol without intravenous contrast. Multiplanar CT image reconstructions of the cervical spine were also generated. COMPARISON:  Brain CT 03/21/2016. FINDINGS: CT HEAD FINDINGS Brain: Ventricles and sulci are prominent compatible with atrophy.  Periventricular and subcortical white matter hypodensity compatible with chronic microvascular ischemic changes. Additionally encephalomalacia is demonstrated throughout the right cerebral hemisphere compatible with remote infarct. Old right cerebellar hemisphere infarct. No evidence for acute cortically based infarct, intracranial hemorrhage, mass lesion or mass-effect. Old left basal ganglia lacunar infarct. Vascular: Internal carotid arterial calcifications Skull: Intact. Sinuses/Orbits: Paranasal sinuses are well aerated. Mastoid air cells are unremarkable. Other: None. CT CERVICAL SPINE FINDINGS Alignment: Normal anatomic alignment. Skull base and vertebrae: Irregular lucency through the right aspect of the C1  ring (image 17; series 7). Soft tissues and spinal canal: Unremarkable. Disc levels: Multilevel degenerative disc disease most pronounced C3-4, C4-5 and C5-6. No acute cervical spine fracture. Upper chest: No consolidative pulmonary opacities. There is a 1.9 cm nodule within the thyroid (image 85; series 6). Other: None. IMPRESSION: Irregular lucency through the posterior right aspect of the C1 ring likely sequelae of remote trauma or potentially secondary to a lytic lesion in the setting of metastatic disease, felt to be less likely. No acute intracranial process. No definite acute cervical spine fracture. Cortical atrophy and old cortically based infarcts. Electronically Signed   By: Lovey Newcomer M.D.   On: 06/04/2016 18:53   Dg Hip Unilat With Pelvis 2-3 Views Right  Result Date: 06/04/2016 CLINICAL DATA:  Fall onto right side, right hip pain, initial encounter. EXAM: DG HIP (WITH OR WITHOUT PELVIS) 2-3V RIGHT COMPARISON:  The the FINDINGS: There is a comminuted intertrochanteric fracture of the right femur with varus angulation. No dislocation. Left hip arthroplasty. Surgical clips in the anatomic pelvis. IMPRESSION: Intertrochanteric right femur fracture. Electronically Signed   By: Lorin Picket M.D.   On: 06/04/2016 19:04    Procedures Procedures (including critical care time)  Medications Ordered in ED Medications  phytonadione (VITAMIN K) 5 mg in dextrose 5 % 50 mL IVPB (5 mg Intravenous New Bag/Given 06/04/16 2022)  ondansetron (ZOFRAN) injection 4 mg (4 mg Intravenous Given 06/04/16 1751)  morphine 4 MG/ML injection 4 mg (4 mg Intravenous Given 06/04/16 1751)     Initial Impression / Assessment and Plan / ED Course  I have reviewed the triage vital signs and the nursing notes.  Pertinent labs & imaging results that were available during my care of the patient were reviewed by me and considered in my medical decision making (see chart for details).  Clinical Course    Patient with fall at nursing facility, complaining of right hip pain. He was awake and alert, uncomfortable but no acute distress at presentation with stable vital signs. He had obvious deformity to right hip with leg shortened and Early rotated. Neurovascularly intact distally. Plain films show intertrochanteric fracture of right femur. Labs are reassuring, INR 2. Discussed with orthopedics, Dr. Ninfa Linden, and per his recommendations I have given the patient IV vitamin K to reverse warfarin in preparation for surgery. Patient will likely be prepared for surgery tomorrow afternoon. He will see the patient in consultation.   I discussed with the patient's daughter and son his injury and they state that he wants to be functional and ambulatory therefore he would be interested in surgical repair. Discussed admission with triad hospitalist, Dr. Tamala Julian, who admitted pt for further care.  Final Clinical Impressions(s) / ED Diagnoses   Final diagnoses:  Closed fracture of right hip, initial encounter [facility])    New Prescriptions New Prescriptions   No medications on file     Sharlett Iles, MD 06/04/16 2118

## 2016-06-05 ENCOUNTER — Encounter (HOSPITAL_COMMUNITY): Payer: Self-pay

## 2016-06-05 ENCOUNTER — Inpatient Hospital Stay (HOSPITAL_COMMUNITY): Payer: Medicare Other

## 2016-06-05 ENCOUNTER — Inpatient Hospital Stay (HOSPITAL_COMMUNITY): Payer: Medicare Other | Admitting: Anesthesiology

## 2016-06-05 ENCOUNTER — Encounter (HOSPITAL_COMMUNITY)
Admission: EM | Disposition: A | Discharge status: Skilled Nursing Facility | Payer: Self-pay | Source: Home / Self Care | Attending: Family Medicine

## 2016-06-05 DIAGNOSIS — S72141A Displaced intertrochanteric fracture of right femur, initial encounter for closed fracture: Secondary | ICD-10-CM

## 2016-06-05 DIAGNOSIS — S72001A Fracture of unspecified part of neck of right femur, initial encounter for closed fracture: Secondary | ICD-10-CM

## 2016-06-05 DIAGNOSIS — Z7901 Long term (current) use of anticoagulants: Secondary | ICD-10-CM

## 2016-06-05 DIAGNOSIS — N400 Enlarged prostate without lower urinary tract symptoms: Secondary | ICD-10-CM | POA: Diagnosis present

## 2016-06-05 HISTORY — PX: FEMUR IM NAIL: SHX1597

## 2016-06-05 LAB — CBC WITH DIFFERENTIAL/PLATELET
BASOS ABS: 0 10*3/uL (ref 0.0–0.1)
BASOS PCT: 0 %
EOS ABS: 0.1 10*3/uL (ref 0.0–0.7)
Eosinophils Relative: 2 %
HEMATOCRIT: 26.9 % — AB (ref 39.0–52.0)
HEMOGLOBIN: 8.8 g/dL — AB (ref 13.0–17.0)
Lymphocytes Relative: 12 %
Lymphs Abs: 0.6 10*3/uL — ABNORMAL LOW (ref 0.7–4.0)
MCH: 30.2 pg (ref 26.0–34.0)
MCHC: 32.7 g/dL (ref 30.0–36.0)
MCV: 92.4 fL (ref 78.0–100.0)
MONO ABS: 0.7 10*3/uL (ref 0.1–1.0)
Monocytes Relative: 14 %
NEUTROS ABS: 3.6 10*3/uL (ref 1.7–7.7)
NEUTROS PCT: 72 %
Platelets: 178 10*3/uL (ref 150–400)
RBC: 2.91 MIL/uL — ABNORMAL LOW (ref 4.22–5.81)
RDW: 15.7 % — AB (ref 11.5–15.5)
WBC: 5.1 10*3/uL (ref 4.0–10.5)

## 2016-06-05 LAB — BASIC METABOLIC PANEL
ANION GAP: 9 (ref 5–15)
BUN: 19 mg/dL (ref 6–20)
CALCIUM: 9 mg/dL (ref 8.9–10.3)
CO2: 24 mmol/L (ref 22–32)
CREATININE: 1.58 mg/dL — AB (ref 0.61–1.24)
Chloride: 105 mmol/L (ref 101–111)
GFR calc non Af Amer: 35 mL/min — ABNORMAL LOW (ref >60)
GFR, EST AFRICAN AMERICAN: 41 mL/min — AB (ref >60)
Glucose, Bld: 119 mg/dL — ABNORMAL HIGH (ref 65–99)
Potassium: 3.6 mmol/L (ref 3.5–5.1)
SODIUM: 138 mmol/L (ref 135–145)

## 2016-06-05 LAB — MRSA PCR SCREENING: MRSA BY PCR: POSITIVE — AB

## 2016-06-05 LAB — PROTIME-INR
INR: 1.77
PROTHROMBIN TIME: 20.9 s — AB (ref 11.4–15.2)

## 2016-06-05 SURGERY — INSERTION, INTRAMEDULLARY ROD, FEMUR
Anesthesia: General | Site: Leg Upper | Laterality: Right

## 2016-06-05 MED ORDER — CEFAZOLIN SODIUM-DEXTROSE 2-4 GM/100ML-% IV SOLN
2.0000 g | Freq: Four times a day (QID) | INTRAVENOUS | Status: AC
Start: 2016-06-06 — End: 2016-06-06
  Administered 2016-06-06 (×2): 2 g via INTRAVENOUS
  Filled 2016-06-05 (×2): qty 100

## 2016-06-05 MED ORDER — METOCLOPRAMIDE HCL 5 MG PO TABS: 5.0000 mg | ORAL_TABLET | Freq: Three times a day (TID) | ORAL | Status: DC | PRN

## 2016-06-05 MED ORDER — CEFAZOLIN SODIUM-DEXTROSE 2-4 GM/100ML-% IV SOLN
2.0000 g | INTRAVENOUS | Status: AC
  Administered 2016-06-05: 2 g via INTRAVENOUS
  Filled 2016-06-05 (×2): qty 100

## 2016-06-05 MED ORDER — LACTATED RINGERS IV SOLN
INTRAVENOUS | Status: DC | PRN
  Administered 2016-06-05 (×2): via INTRAVENOUS

## 2016-06-05 MED ORDER — ONDANSETRON HCL 4 MG/2ML IJ SOLN: 4.0000 mg | Freq: Four times a day (QID) | INTRAMUSCULAR | Status: DC | PRN

## 2016-06-05 MED ORDER — MENTHOL 3 MG MT LOZG: 1.0000 | LOZENGE | OROMUCOSAL | Status: DC | PRN

## 2016-06-05 MED ORDER — MUPIROCIN 2 % EX OINT
1.0000 "application " | TOPICAL_OINTMENT | Freq: Two times a day (BID) | CUTANEOUS | Status: DC
  Administered 2016-06-05 – 2016-06-09 (×9): 1 via NASAL
  Filled 2016-06-05: qty 22

## 2016-06-05 MED ORDER — CHLORHEXIDINE GLUCONATE 4 % EX LIQD
60.0000 mL | Freq: Once | CUTANEOUS | Status: AC
  Administered 2016-06-05: 4 via TOPICAL
  Filled 2016-06-05: qty 15

## 2016-06-05 MED ORDER — MORPHINE SULFATE (PF) 2 MG/ML IV SOLN: 0.5000 mg | INTRAVENOUS | Status: DC | PRN

## 2016-06-05 MED ORDER — PHENYLEPHRINE HCL 10 MG/ML IJ SOLN
INTRAMUSCULAR | Status: DC | PRN
  Administered 2016-06-05 (×2): 80 ug via INTRAVENOUS

## 2016-06-05 MED ORDER — CHLORHEXIDINE GLUCONATE CLOTH 2 % EX PADS
6.0000 | MEDICATED_PAD | Freq: Every day | CUTANEOUS | Status: DC
  Administered 2016-06-05 – 2016-06-09 (×4): 6 via TOPICAL

## 2016-06-05 MED ORDER — FENTANYL CITRATE (PF) 100 MCG/2ML IJ SOLN
INTRAMUSCULAR | Status: AC
  Filled 2016-06-05: qty 2

## 2016-06-05 MED ORDER — FENTANYL CITRATE (PF) 100 MCG/2ML IJ SOLN
INTRAMUSCULAR | Status: DC | PRN
  Administered 2016-06-05: 25 ug via INTRAVENOUS

## 2016-06-05 MED ORDER — ACETAMINOPHEN 650 MG RE SUPP: 650.0000 mg | Freq: Four times a day (QID) | RECTAL | Status: DC | PRN

## 2016-06-05 MED ORDER — LIDOCAINE HCL (CARDIAC) 20 MG/ML IV SOLN
INTRAVENOUS | Status: DC | PRN
  Administered 2016-06-05: 50 mg via INTRATRACHEAL

## 2016-06-05 MED ORDER — PROPOFOL 10 MG/ML IV BOLUS
INTRAVENOUS | Status: DC | PRN
  Administered 2016-06-05: 90 mg via INTRAVENOUS

## 2016-06-05 MED ORDER — PHENOL 1.4 % MT LIQD: 1.0000 | OROMUCOSAL | Status: DC | PRN

## 2016-06-05 MED ORDER — POVIDONE-IODINE 10 % EX SWAB: 2.0000 "application " | Freq: Once | CUTANEOUS | Status: DC

## 2016-06-05 MED ORDER — SODIUM CHLORIDE 0.9 % IV SOLN
INTRAVENOUS | Status: DC
  Administered 2016-06-06 – 2016-06-08 (×3): via INTRAVENOUS

## 2016-06-05 MED ORDER — ACETAMINOPHEN 325 MG PO TABS
650.0000 mg | ORAL_TABLET | Freq: Four times a day (QID) | ORAL | Status: DC | PRN
  Administered 2016-06-06 – 2016-06-08 (×3): 650 mg via ORAL
  Filled 2016-06-05 (×3): qty 2

## 2016-06-05 MED ORDER — DEXTROSE 5 % IV SOLN
INTRAVENOUS | Status: DC | PRN
  Administered 2016-06-05: 25 ug/min via INTRAVENOUS

## 2016-06-05 MED ORDER — METOCLOPRAMIDE HCL 5 MG/ML IJ SOLN: 5.0000 mg | Freq: Three times a day (TID) | INTRAMUSCULAR | Status: DC | PRN

## 2016-06-05 MED ORDER — ORAL CARE MOUTH RINSE
15.0000 mL | Freq: Two times a day (BID) | OROMUCOSAL | Status: DC
  Administered 2016-06-06 – 2016-06-09 (×6): 15 mL via OROMUCOSAL

## 2016-06-05 MED ORDER — ONDANSETRON HCL 4 MG PO TABS: 4.0000 mg | ORAL_TABLET | Freq: Four times a day (QID) | ORAL | Status: DC | PRN

## 2016-06-05 MED ORDER — HYDROCODONE-ACETAMINOPHEN 5-325 MG PO TABS
1.0000 | ORAL_TABLET | Freq: Four times a day (QID) | ORAL | Status: DC | PRN
  Administered 2016-06-07 – 2016-06-08 (×2): 1 via ORAL
  Filled 2016-06-05 (×2): qty 1

## 2016-06-05 MED ORDER — SUCCINYLCHOLINE CHLORIDE 20 MG/ML IJ SOLN
INTRAMUSCULAR | Status: DC | PRN
  Administered 2016-06-05: 80 mg via INTRAVENOUS

## 2016-06-05 SURGICAL SUPPLY — 46 items
BNDG COHESIVE 4X5 TAN NS LF (GAUZE/BANDAGES/DRESSINGS) ×2 IMPLANT
COVER PERINEAL POST (MISCELLANEOUS) ×2 IMPLANT
COVER SURGICAL LIGHT HANDLE (MISCELLANEOUS) ×2 IMPLANT
DRAPE STERI IOBAN 125X83 (DRAPES) ×2 IMPLANT
DRSG MEPILEX BORDER 4X4 (GAUZE/BANDAGES/DRESSINGS) ×2 IMPLANT
DRSG MEPILEX BORDER 4X8 (GAUZE/BANDAGES/DRESSINGS) ×2 IMPLANT
DRSG PAD ABDOMINAL 8X10 ST (GAUZE/BANDAGES/DRESSINGS) ×4 IMPLANT
DURAPREP 26ML APPLICATOR (WOUND CARE) ×2 IMPLANT
ELECT REM PT RETURN 9FT ADLT (ELECTROSURGICAL) ×2
ELECTRODE REM PT RTRN 9FT ADLT (ELECTROSURGICAL) ×1 IMPLANT
FACESHIELD WRAPAROUND (MASK) ×2 IMPLANT
GAUZE XEROFORM 1X8 LF (GAUZE/BANDAGES/DRESSINGS) ×2 IMPLANT
GAUZE XEROFORM 5X9 LF (GAUZE/BANDAGES/DRESSINGS) ×2 IMPLANT
GLOVE BIO SURGEON STRL SZ 6.5 (GLOVE) ×6 IMPLANT
GLOVE BIO SURGEON STRL SZ8 (GLOVE) ×2 IMPLANT
GLOVE BIOGEL PI IND STRL 6.5 (GLOVE) ×2 IMPLANT
GLOVE BIOGEL PI IND STRL 8 (GLOVE) ×1 IMPLANT
GLOVE BIOGEL PI INDICATOR 6.5 (GLOVE) ×2
GLOVE BIOGEL PI INDICATOR 8 (GLOVE) ×1
GLOVE ECLIPSE 7.5 STRL STRAW (GLOVE) ×2 IMPLANT
GLOVE ORTHO TXT STRL SZ7.5 (GLOVE) ×4 IMPLANT
GOWN STRL REUS W/ TWL LRG LVL3 (GOWN DISPOSABLE) ×1 IMPLANT
GOWN STRL REUS W/ TWL XL LVL3 (GOWN DISPOSABLE) ×2 IMPLANT
GOWN STRL REUS W/TWL LRG LVL3 (GOWN DISPOSABLE) ×1
GOWN STRL REUS W/TWL XL LVL3 (GOWN DISPOSABLE) ×2
GUIDEPIN 3.2X17.5 THRD DISP (PIN) ×2 IMPLANT
HIP FRAC NAIL LAG SCR 10.5X100 (Orthopedic Implant) ×1 IMPLANT
KIT BASIN OR (CUSTOM PROCEDURE TRAY) ×2 IMPLANT
KIT ROOM TURNOVER OR (KITS) ×2 IMPLANT
LINER BOOT UNIVERSAL DISP (MISCELLANEOUS) ×2 IMPLANT
MANIFOLD NEPTUNE II (INSTRUMENTS) ×2 IMPLANT
NAIL HIP FRACTURE 11X380MM (Nail) ×2 IMPLANT
NS IRRIG 1000ML POUR BTL (IV SOLUTION) ×2 IMPLANT
PACK GENERAL/GYN (CUSTOM PROCEDURE TRAY) ×2 IMPLANT
PAD ARMBOARD 7.5X6 YLW CONV (MISCELLANEOUS) ×4 IMPLANT
PAD CAST 4YDX4 CTTN HI CHSV (CAST SUPPLIES) ×2 IMPLANT
PADDING CAST COTTON 4X4 STRL (CAST SUPPLIES) ×2
SCREW CANN THRD AFF 10.5X100 (Orthopedic Implant) ×1 IMPLANT
STAPLER VISISTAT 35W (STAPLE) ×2 IMPLANT
SUT VIC AB 0 CT1 27 (SUTURE) ×2
SUT VIC AB 0 CT1 27XBRD ANBCTR (SUTURE) ×2 IMPLANT
SUT VIC AB 2-0 CT1 27 (SUTURE) ×2
SUT VIC AB 2-0 CT1 TAPERPNT 27 (SUTURE) ×2 IMPLANT
TOWEL OR 17X24 6PK STRL BLUE (TOWEL DISPOSABLE) ×2 IMPLANT
TOWEL OR 17X26 10 PK STRL BLUE (TOWEL DISPOSABLE) ×2 IMPLANT
WATER STERILE IRR 1000ML POUR (IV SOLUTION) ×2 IMPLANT

## 2016-06-05 NOTE — Progress Notes (Signed)
Patient ID: Kurt Thomas, male   DOB: 1919/03/22, 82 y.o.   MRN: [id number]    PROGRESS NOTE  Kurt Thomas  [id number] DOB: Jul 29, 1918 DOA: 06/04/2016  PCP: Melinda Crutch, MD   Brief Narrative:  82 y.o. male with medical history significant of HTN, CVA, afib on chronic afib, and Alz dementia;  who presented for complaints of right hip pain after fall.   ED Course:  Upon admission to the emergency department patient was evaluated and seen to be afebrile with vitals relatively within normal limits except for some O2 sats in the upper 80s after pain medications given. Lab work revealed INR of 2.24 and otherwise appeared near his baseline. Dr. Ninfa Linden of orthopedics evaluated the patient and recommended giving the patient vitamin K, plan for surgery 11/27.   Assessment & Plan:   Principal Problem: Right intertrochanteric femur/hip fracture - INR 1.7 this AM, plan for surgery this evening  - appreciate ortho team following  - allow analgesia as needed   Active Problems: Atrial fibrillation on chronic anticoagulation therapy - Heart rates well controlled at this time. Initial INR noted to be 2.24. Patient given  5 mg of vitamin K in the ED - Hold warfarin for now, INR 1.7 this AM  - Restart warfarin when given okay following surgical procedure  Essential hypertension - Continue Coreg, BiDil, and amlodipine  - reasonable inpatient control    Chronic kidney disease stage III - Continue to monitor  - BMP In AM  Anemia of chronic disease - no signs of active bleeding  - CBC in AM  Dementia - Continue Aricept  Glaucoma - Continue latanpoprost, Combigan  BPH - Continue Flomax  GERD - continue PPI  DVT prophylaxis: SCD's, Warfarin on hold for now until post op  Code Status: Full  Family Communication: Patient at bedside  Disposition Plan: To be determined   Consultants:   Ortho   Procedures:   Right hip repair 11/27 -->  Antimicrobials:   None   Subjective: No  events overnight.   Objective: Vitals:   06/04/16 2100 06/04/16 2250 06/05/16 0400 06/05/16 1411  BP: 126/62 133/62 115/76 120/66  Pulse: 82 76 82 78  Resp: 17 16 16    Temp:  97.4 F (36.3 C) 97.9 F (36.6 C) 98.1 F (36.7 C)  TempSrc:  Oral Oral Oral  SpO2: 94% 94% (!) 88% 93%    Intake/Output Summary (Last 24 hours) at 06/05/16 1923 Last data filed at 06/05/16 1412  Gross per 24 hour  Intake                0 ml  Output              250 ml  Net             -250 ml   There were no vitals filed for this visit.  Examination:  General exam: Appears calm and comfortable. HOH Respiratory system: Clear to auscultation. Respiratory effort normal. Cardiovascular system: S1 & S2 heard, RRR. No JVD, rubs, gallops or clicks. No pedal edema. Gastrointestinal system: Abdomen is nondistended, soft and nontender. No organomegaly or masses felt. Normal bowel sounds heard. Central nervous system: Alert and oriented. No focal neurological deficits.  Data Reviewed: I have personally reviewed following labs and imaging studies  CBC:  Recent Labs Lab 06/04/16 1748 06/05/16 0547  WBC 5.9 5.1  NEUTROABS 4.4 3.6  HGB 9.4* 8.8*  HCT 28.7* 26.9*  MCV 90.8 92.4  PLT 197 178  Basic Metabolic Panel:  Recent Labs Lab 06/04/16 1748 06/05/16 0547  NA 137 138  K 3.5 3.6  CL 104 105  CO2 24 24  GLUCOSE 124* 119*  BUN 18 19  CREATININE 1.67* 1.58*  CALCIUM 9.2 9.0   Coagulation Profile:  Recent Labs Lab 06/04/16 1748 06/05/16 0547  INR 2.09 1.77    Recent Results (from the past 240 hour(s))  MRSA PCR Screening     Status: Abnormal   Collection Time: 06/05/16  5:56 AM  Result Value Ref Range Status   MRSA by PCR POSITIVE (A) NEGATIVE Final    Radiology Studies: Dg Chest 1 View Result Date: 06/04/2016 Mild patchy opacification in the lateral right midlung zone may be due to contusion and/or rib fractures.   Ct Head Wo Contrast Result Date: 06/04/2016 Irregular lucency  through the posterior right aspect of the C1 ring likely sequelae of remote trauma or potentially secondary to a lytic lesion in the setting of metastatic disease, felt to be less likely. No acute intracranial process. No definite acute cervical spine fracture. Cortical atrophy and old cortically based infarcts.   Ct Cervical Spine Wo Contrast Result Date: 06/04/2016 Irregular lucency through the posterior right aspect of the C1 ring likely sequelae of remote trauma or potentially secondary to a lytic lesion in the setting of metastatic disease, felt to be less likely. No acute intracranial process. No definite acute cervical spine fracture. Cortical atrophy and old cortically based infarcts.   Dg Hip Unilat With Pelvis 2-3 Views Right Result Date: 06/04/2016 Intertrochanteric right femur fracture.   Scheduled Meds: . [MAR Hold] amLODipine  10 mg Oral Daily  . [MAR Hold] brimonidine  1 drop Both Eyes BID  . [MAR Hold] carvedilol  12.5 mg Oral BID WC  .  ceFAZolin (ANCEF) IV  2 g Intravenous To SS-Surg  . [MAR Hold] Chlorhexidine Gluconate Cloth  6 each Topical Q0600  . [MAR Hold] donepezil  10 mg Oral QHS  . [MAR Hold] DULoxetine  30 mg Oral Daily  . [MAR Hold] famotidine  20 mg Oral Daily  . [MAR Hold] feeding supplement (ENSURE ENLIVE)  237 mL Oral TID BM  . [MAR Hold] fluticasone  2 spray Each Nare QHS  . [MAR Hold] hydrALAZINE  50 mg Oral BID  . [MAR Hold] isosorbide-hydrALAZINE  1 tablet Oral BID  . [MAR Hold] latanoprost  1 drop Both Eyes QHS  . [MAR Hold] mirabegron ER  25 mg Oral Daily  . [MAR Hold] mupirocin ointment  1 application Nasal BID  . [MAR Hold] polyethylene glycol  17 g Oral BID  . povidone-iodine  2 application Topical Once  . [MAR Hold] tamsulosin  0.4 mg Oral QPM  . [MAR Hold] timolol  1 drop Both Eyes BID   Continuous Infusions:   LOS: 1 day    Time spent: 20 minutes    Faye Ramsay, MD [facility] Pager [phone]  If 7PM-7AM, please  contact night-coverage [email or web address] Password [facility] 06/05/2016, 7:23 PM

## 2016-06-05 NOTE — Transfer of Care (Signed)
Immediate Anesthesia Transfer of Care Note  Patient: Kurt Thomas  Procedure(s) Performed: Procedure(s): INTRAMEDULLARY (IM) NAIL FEMORAL (Right)  Patient Location: PACU  Anesthesia Type:General  Level of Consciousness: awake, sedated and patient cooperative  Airway & Oxygen Therapy: Patient connected to nasal cannula oxygen  Post-op Assessment: Report given to RN and Post -op Vital signs reviewed and stable  Post vital signs: Reviewed and stable  Last Vitals:  Vitals:   06/05/16 1411 06/05/16 2105  BP: 120/66 125/63  Pulse: 78 76  Resp:  20  Temp: 36.7 C 36.4 C    Last Pain:  Vitals:   06/05/16 2105  TempSrc:   PainSc: (P) 0-No pain         Complications: No apparent anesthesia complications

## 2016-06-05 NOTE — Anesthesia Preprocedure Evaluation (Deleted)
Anesthesia Evaluation   Patient awake    Airway        Dental   Pulmonary neg pulmonary ROS, former smoker,           Cardiovascular hypertension, + CAD  + dysrhythmias Atrial Fibrillation      Neuro/Psych PSYCHIATRIC DISORDERS Depression CVA    GI/Hepatic negative GI ROS, Neg liver ROS,   Endo/Other  negative endocrine ROS  Renal/GU Renal disease  negative genitourinary   Musculoskeletal negative musculoskeletal ROS (+)   Abdominal   Peds negative pediatric ROS (+)  Hematology negative hematology ROS (+)   Anesthesia Other Findings   Reproductive/Obstetrics negative OB ROS                             Anesthesia Physical Anesthesia Plan  ASA: III  Anesthesia Plan: General   Post-op Pain Management:    Induction: Intravenous  Airway Management Planned: Oral ETT  Additional Equipment:   Intra-op Plan:   Post-operative Plan: Extubation in OR  Informed Consent: I have reviewed the patients History and Physical, chart, labs and discussed the procedure including the risks, benefits and alternatives for the proposed anesthesia with the patient or authorized representative who has indicated his/her understanding and acceptance.   Dental advisory given  Plan Discussed with: CRNA  Anesthesia Plan Comments:         Anesthesia Quick Evaluation

## 2016-06-05 NOTE — Anesthesia Procedure Notes (Signed)
Procedure Name: Intubation Date/Time: 06/05/2016 8:08 PM Performed by: Valetta Fuller Pre-anesthesia Checklist: Patient identified, Emergency Drugs available, Suction available and Patient being monitored Patient Re-evaluated:Patient Re-evaluated prior to inductionOxygen Delivery Method: Circle system utilized Preoxygenation: Pre-oxygenation with 100% oxygen Intubation Type: IV induction and Rapid sequence Laryngoscope Size: Miller and 2 Grade View: Grade I Tube type: Oral Tube size: 7.5 mm Number of attempts: 1 Airway Equipment and Method: Stylet Placement Confirmation: ETT inserted through vocal cords under direct vision,  positive ETCO2 and breath sounds checked- equal and bilateral Secured at: 22 cm Tube secured with: Tape Dental Injury: Teeth and Oropharynx as per pre-operative assessment

## 2016-06-05 NOTE — OR Nursing (Signed)
Handoff report received after pt arrived to pacu from the OR.  Reported that no urine was received after foley was placed during the surgery.  Upon assessment, I feel a bulge mid shaft and there is some bleeding at the meatus. I notified Dr. Ninfa Linden of the issues with the catheter.  I attempted to deflate the indwelling balloon and am unable to withdraw any sterile water from the foley. I attempted to flush the catheter with sterile water and sterile water was returned out the meatus of the penis.  Urology was consulted by Dr. Ninfa Linden.

## 2016-06-05 NOTE — Consult Note (Signed)
Assessment: 1.  The foley was kinked back on itself in the urethra obstructing the balloon.   I replaced the foley with a 68fr coude and that should be left indwelling for 48 hours to allow for urethral healing. 2.  History of prostate cancer with prior radical prostatectomy and probable mild bladder neck contracture.  Subjective: Kurt Thomas is a 82 yo WM who had ORIF of a right hip fracture today.  I was asked to see the patient in consultation by Dr. Ninfa Linden for catheter issues.   The foley was placed in the OR but didn't drain and wouldn't irrigated.   When the PACU staff attempted to remove the catheter the balloon wouldn't deflate.   Kurt Thomas is a patient in our office and saw Dr. Louis Meckel on 05/23/16.   He had a radical prostatectomy 20 years ago and is currently being managed for incontinence with Myrbetriq.   He is resting quietly in PACU and is unable to provide his history.   ROS:  Review of Systems  Unable to perform ROS: Dementia     No Known Allergies  Past Medical History:  Diagnosis Date  . Alzheimer's disease, focal onset   . Atrial fibrillation ([place])   . CAD (coronary artery disease)   . Chronic atrial fibrillation ([place])   . High blood pressure   . Low back pain   . Memory loss   . Prostate cancer ([place])   . Recurrent major depression ([place])   . Stroke [facility])     Past Surgical History:  Procedure Laterality Date  . CATARACT EXTRACTION    . CORONARY ARTERY BYPASS GRAFT    . HIP ARTHROPLASTY Left 06/22/2014   Procedure: LEFT HEMIARTHROPLASTY HIP;  Surgeon: Meredith Pel, MD;  Location: [facility];  Service: Orthopedics;  Laterality: Left;  . PROSTATECTOMY      Social History   Social History  . Marital status: Married    Spouse name: N/A  . Number of children: 1  . Years of education: college   Occupational History  .      Retired   Social History Main Topics  . Smoking status: Former Smoker    Packs/day: 1.00    Years: 1.00    Types: Cigarettes     Quit date: 07/10/1956  . Smokeless tobacco: Never Used  . Alcohol use No  . Drug use: No  . Sexual activity: Not on file   Other Topics Concern  . Not on file   Social History Narrative   Patient is retired and Lives in [place]    Education college    Right handed.    Family History  Problem Relation Age of Onset  . Cancer Father   . High blood pressure      Anti-infectives: Anti-infectives    Start     Dose/Rate Route Frequency Ordered Stop   06/05/16 0800  ceFAZolin (ANCEF) IVPB 2g/100 mL premix     2 g 200 mL/hr over 30 Minutes Intravenous To [facility] 06/05/16 0724 06/05/16 1940      Current Facility-Administered Medications  Medication Dose Route Frequency Provider Last Rate Last Dose  . [MAR Hold] albuterol (PROVENTIL) (2.5 MG/3ML) 0.083% nebulizer solution 2.5 mg  2.5 mg Nebulization Q6H PRN Norval Morton, MD      . Doug Sou Hold] amLODipine (NORVASC) tablet 10 mg  10 mg Oral Daily Norval Morton, MD   10 mg at 06/05/16 0823  . [MAR Hold] brimonidine (ALPHAGAN) 0.2 %  ophthalmic solution 1 drop  1 drop Both Eyes BID Norval Morton, MD   1 drop at 06/05/16 0816  . [MAR Hold] carvedilol (COREG) tablet 12.5 mg  12.5 mg Oral BID WC Rondell Charmayne Sheer, MD   12.5 mg at 06/05/16 0818  . [MAR Hold] Chlorhexidine Gluconate Cloth 2 % PADS 6 each  6 each Topical Q0600 Tyrone Apple, [place]   6 each at 06/05/16 1841  . [MAR Hold] diphenhydrAMINE (BENADRYL) 12.5 MG/5ML elixir 12.5 mg  12.5 mg Oral TID PRN Norval Morton, MD      . Doug Sou Hold] donepezil (ARICEPT) tablet 10 mg  10 mg Oral QHS Norval Morton, MD   10 mg at 06/04/16 2323  . [MAR Hold] DULoxetine (CYMBALTA) DR capsule 30 mg  30 mg Oral Daily Norval Morton, MD   30 mg at 06/05/16 0818  . [MAR Hold] famotidine (PEPCID) tablet 20 mg  20 mg Oral Daily Norval Morton, MD   20 mg at 06/05/16 0817  . [MAR Hold] feeding supplement (ENSURE ENLIVE) (ENSURE ENLIVE) liquid 237 mL  237 mL Oral TID BM Rondell Charmayne Sheer, MD       . Doug Sou Hold] fluticasone (FLONASE) 50 MCG/ACT nasal spray 2 spray  2 spray Each Nare QHS Norval Morton, MD   2 spray at 06/04/16 2357  . [MAR Hold] hydrALAZINE (APRESOLINE) tablet 50 mg  50 mg Oral BID Norval Morton, MD   50 mg at 06/05/16 0818  . [MAR Hold] HYDROcodone-acetaminophen (NORCO/VICODIN) 5-325 MG per tablet 1-2 tablet  1-2 tablet Oral Q6H PRN Norval Morton, MD   2 tablet at 06/04/16 2323  . [MAR Hold] isosorbide-hydrALAZINE (BIDIL) 20-37.5 MG per tablet 1 tablet  1 tablet Oral BID Norval Morton, MD   1 tablet at 06/05/16 0817  . [MAR Hold] latanoprost (XALATAN) 0.005 % ophthalmic solution 1 drop  1 drop Both Eyes QHS Norval Morton, MD   1 drop at 06/04/16 2357  . MEDLINE mouth rinse  15 mL Mouth Rinse BID Theodis Blaze, MD      . Doug Sou Hold] mirabegron ER [facility]) tablet 25 mg  25 mg Oral Daily Norval Morton, MD   25 mg at 06/05/16 0818  . [MAR Hold] morphine 2 MG/ML injection 0.5-1 mg  0.5-1 mg Intravenous Q2H PRN Norval Morton, MD   1 mg at 06/05/16 0446  . [MAR Hold] mupirocin ointment (BACTROBAN) 2 % 1 application  1 application Nasal BID Tyrone Apple, [facility]   1 application at [id number]  . [MAR Hold] polyethylene glycol (MIRALAX / GLYCOLAX) packet 17 g  17 g Oral BID Norval Morton, MD   17 g at 06/04/16 2350  . povidone-iodine 10 % swab 2 application  2 application Topical Once Mcarthur Rossetti, MD      . Doug Sou Hold] tamsulosin [facility]) capsule 0.4 mg  0.4 mg Oral QPM Norval Morton, MD   0.4 mg at 06/04/16 2350  . [MAR Hold] timolol (TIMOPTIC) 0.5 % ophthalmic solution 1 drop  1 drop Both Eyes BID Norval Morton, MD   1 drop at 06/05/16 0817     Objective: Vital signs in last 24 hours: Temp:  [97.4 F (36.3 C)-98.1 F (36.7 C)] 97.6 F (36.4 C) (11/27 2105) Pulse Rate:  [76-83] 77 (11/27 2215) Resp:  [15-20] 15 (11/27 2215) BP: (114-133)/(59-93) 116/93 (11/27 2208) SpO2:  [88 %-95 %] 93 % (11/27 2215)  Intake/Output from previous day: 11/26  0701 - 11/27 0700 In: -  Out: 150 [Urine:150] Intake/Output this shift: Total I/O In: 1000 [I.V.:1000] Out: 50 [Blood:50]   Physical Exam  Constitutional:  Elderly cachectic WM in NAD  Cardiovascular: Normal rate and regular rhythm.   Pulmonary/Chest: Effort normal. No respiratory distress.  Abdominal: Soft. He exhibits no mass. There is no tenderness.  Genitourinary:  Genitourinary Comments: Foley at meatus with blood around the foley.   The catheter balloon is palpable in the mid urethra.    Musculoskeletal: He exhibits no edema.  Immobilized from recent hip surgery.   Neurological:  Confused and sedated  Skin: Skin is warm and dry.    Lab Results:   Recent Labs  06/04/16 1748 06/05/16 0547  WBC 5.9 5.1  HGB 9.4* 8.8*  HCT 28.7* 26.9*  PLT 197 178   BMET  Recent Labs  06/04/16 1748 06/05/16 0547  NA 137 138  K 3.5 3.6  CL 104 105  CO2 24 24  GLUCOSE 124* 119*  BUN 18 19  CREATININE 1.67* 1.58*  CALCIUM 9.2 9.0   PT/INR  Recent Labs  06/04/16 1748 06/05/16 0547  LABPROT 23.8* 20.9*  INR 2.09 1.77   ABG No results for input(s): PHART, HCO3 in the last 72 hours.  Invalid input(s): PCO2, PO2  Studies/Results: Dg Chest 1 View  Result Date: 06/04/2016 CLINICAL DATA:  Fall, right hip pain, right femur fracture, initial encounter. EXAM: CHEST 1 VIEW COMPARISON:  06/20/2014. FINDINGS: Trachea is midline. Heart is enlarged. Devitalized wires overlie the heart. Mild patchy opacification in the lateral right midlung zone is new. Calcified granuloma in the left midlung zone. Lungs are otherwise clear. No pleural fluid. IMPRESSION: Mild patchy opacification in the lateral right midlung zone may be due to contusion and/or rib fractures. Electronically Signed   By: Lorin Picket M.D.   On: 06/04/2016 19:06   Ct Head Wo Contrast  Result Date: 06/04/2016 CLINICAL DATA:  Patient status post fall. No reported loss of consciousness. Head neck pain. EXAM: CT  HEAD WITHOUT CONTRAST CT CERVICAL SPINE WITHOUT CONTRAST TECHNIQUE: Multidetector CT imaging of the head and cervical spine was performed following the standard protocol without intravenous contrast. Multiplanar CT image reconstructions of the cervical spine were also generated. COMPARISON:  Brain CT 03/21/2016. FINDINGS: CT HEAD FINDINGS Brain: Ventricles and sulci are prominent compatible with atrophy. Periventricular and subcortical white matter hypodensity compatible with chronic microvascular ischemic changes. Additionally encephalomalacia is demonstrated throughout the right cerebral hemisphere compatible with remote infarct. Old right cerebellar hemisphere infarct. No evidence for acute cortically based infarct, intracranial hemorrhage, mass lesion or mass-effect. Old left basal ganglia lacunar infarct. Vascular: Internal carotid arterial calcifications Skull: Intact. Sinuses/Orbits: Paranasal sinuses are well aerated. Mastoid air cells are unremarkable. Other: None. CT CERVICAL SPINE FINDINGS Alignment: Normal anatomic alignment. Skull base and vertebrae: Irregular lucency through the right aspect of the C1 ring (image 17; series 7). Soft tissues and spinal canal: Unremarkable. Disc levels: Multilevel degenerative disc disease most pronounced C3-4, C4-5 and C5-6. No acute cervical spine fracture. Upper chest: No consolidative pulmonary opacities. There is a 1.9 cm nodule within the thyroid (image 85; series 6). Other: None. IMPRESSION: Irregular lucency through the posterior right aspect of the C1 ring likely sequelae of remote trauma or potentially secondary to a lytic lesion in the setting of metastatic disease, felt to be less likely. No acute intracranial process. No definite acute cervical spine fracture. Cortical atrophy and old cortically based  infarcts. Electronically Signed   By: Lovey Newcomer M.D.   On: 06/04/2016 18:53   Ct Cervical Spine Wo Contrast  Result Date: 06/04/2016 CLINICAL DATA:   Patient status post fall. No reported loss of consciousness. Head neck pain. EXAM: CT HEAD WITHOUT CONTRAST CT CERVICAL SPINE WITHOUT CONTRAST TECHNIQUE: Multidetector CT imaging of the head and cervical spine was performed following the standard protocol without intravenous contrast. Multiplanar CT image reconstructions of the cervical spine were also generated. COMPARISON:  Brain CT 03/21/2016. FINDINGS: CT HEAD FINDINGS Brain: Ventricles and sulci are prominent compatible with atrophy. Periventricular and subcortical white matter hypodensity compatible with chronic microvascular ischemic changes. Additionally encephalomalacia is demonstrated throughout the right cerebral hemisphere compatible with remote infarct. Old right cerebellar hemisphere infarct. No evidence for acute cortically based infarct, intracranial hemorrhage, mass lesion or mass-effect. Old left basal ganglia lacunar infarct. Vascular: Internal carotid arterial calcifications Skull: Intact. Sinuses/Orbits: Paranasal sinuses are well aerated. Mastoid air cells are unremarkable. Other: None. CT CERVICAL SPINE FINDINGS Alignment: Normal anatomic alignment. Skull base and vertebrae: Irregular lucency through the right aspect of the C1 ring (image 17; series 7). Soft tissues and spinal canal: Unremarkable. Disc levels: Multilevel degenerative disc disease most pronounced C3-4, C4-5 and C5-6. No acute cervical spine fracture. Upper chest: No consolidative pulmonary opacities. There is a 1.9 cm nodule within the thyroid (image 85; series 6). Other: None. IMPRESSION: Irregular lucency through the posterior right aspect of the C1 ring likely sequelae of remote trauma or potentially secondary to a lytic lesion in the setting of metastatic disease, felt to be less likely. No acute intracranial process. No definite acute cervical spine fracture. Cortical atrophy and old cortically based infarcts. Electronically Signed   By: Lovey Newcomer M.D.   On: 06/04/2016  18:53   Dg Hip Unilat With Pelvis 2-3 Views Right  Result Date: 06/04/2016 CLINICAL DATA:  Fall onto right side, right hip pain, initial encounter. EXAM: DG HIP (WITH OR WITHOUT PELVIS) 2-3V RIGHT COMPARISON:  The the FINDINGS: There is a comminuted intertrochanteric fracture of the right femur with varus angulation. No dislocation. Left hip arthroplasty. Surgical clips in the anatomic pelvis. IMPRESSION: Intertrochanteric right femur fracture. Electronically Signed   By: Lorin Picket M.D.   On: 06/04/2016 19:04    I have reviewed his past medical, surgical and social history.       I have reviewed his labs and our recent office notes from Dr. Louis Meckel.  Procedure:   An attempt to deflate the balloon was unsuccessful through the port.    I prepped the mid penis with alcohol and used a 25g needle to perforate the balloon which then deflated.   The foley was removed and was found to be doubled back on itself.   I re-prepped the penis with betadine and inserted a 16 fr coude foley without difficulty.   The balloon was filled with 64ml and the catheter was placed to drainage with return of concentrated urine.      CC: Dr. Zollie Beckers     Irine Seal J 06/05/2016 [phone]

## 2016-06-05 NOTE — Anesthesia Preprocedure Evaluation (Signed)
Anesthesia Evaluation  Patient identified by MRN, date of birth, ID band Patient awake    Reviewed: reviewed documented beta blocker date and time   Airway Mallampati: II  TM Distance: >3 FB Neck ROM: Full    Dental  (+) Edentulous Upper, Dental Advisory Given   Pulmonary former smoker,    breath sounds clear to auscultation       Cardiovascular hypertension, Pt. on medications and Pt. on home beta blockers + CAD  + dysrhythmias Atrial Fibrillation  Rhythm:Irregular Rate:Abnormal     Neuro/Psych PSYCHIATRIC DISORDERS Depression CVA    GI/Hepatic negative GI ROS, Neg liver ROS,   Endo/Other  negative endocrine ROS  Renal/GU Renal InsufficiencyRenal disease  negative genitourinary   Musculoskeletal negative musculoskeletal ROS (+)   Abdominal   Peds negative pediatric ROS (+)  Hematology negative hematology ROS (+)   Anesthesia Other Findings   Reproductive/Obstetrics negative OB ROS                             Lab Results  Component Value Date   WBC 5.1 06/05/2016   HGB 8.8 (L) 06/05/2016   HCT 26.9 (L) 06/05/2016   MCV 92.4 06/05/2016   PLT 178 06/05/2016   Lab Results  Component Value Date   CREATININE 1.58 (H) 06/05/2016   BUN 19 06/05/2016   NA 138 06/05/2016   K 3.6 06/05/2016   CL 105 06/05/2016   CO2 24 06/05/2016   Lab Results  Component Value Date   INR 1.77 06/05/2016   INR 2.09 06/04/2016   INR 2.45 (H) 09/08/2014     Anesthesia Physical Anesthesia Plan  ASA: III  Anesthesia Plan: General   Post-op Pain Management:    Induction: Intravenous  Airway Management Planned: Oral ETT  Additional Equipment:   Intra-op Plan:   Post-operative Plan: Extubation in OR  Informed Consent: I have reviewed the patients History and Physical, chart, labs and discussed the procedure including the risks, benefits and alternatives for the proposed anesthesia with the  patient or authorized representative who has indicated his/her understanding and acceptance.   Dental advisory given  Plan Discussed with: CRNA  Anesthesia Plan Comments:         Anesthesia Quick Evaluation

## 2016-06-05 NOTE — Progress Notes (Signed)
Report given to [facility], CRNA

## 2016-06-05 NOTE — Anesthesia Preprocedure Evaluation (Incomplete)
Anesthesia Evaluation    Airway       Dental   Pulmonary former smoker,          Cardiovascular hypertension,     Neuro/Psych    GI/Hepatic   Endo/Other    Renal/GU      Musculoskeletal   Abdominal   Peds  Hematology   Anesthesia Other Findings   Reproductive/Obstetrics                          Anesthesia Physical Anesthesia Plan Anesthesia Quick Evaluation  

## 2016-06-05 NOTE — Progress Notes (Signed)
Initial Nutrition Assessment  DOCUMENTATION CODES:   Severe malnutrition in context of chronic illness  INTERVENTION:  Once diet advances, continue Ensure Enlive po TID, each supplement provides 350 kcal and 20 grams of protein.  Recommend obtaining new weight to fully assess weight trends.   NUTRITION DIAGNOSIS:   Malnutrition (severe) related to chronic illness as evidenced by severe depletion of body fat, severe depletion of muscle mass.  GOAL:   Patient will meet greater than or equal to 90% of their needs  MONITOR:   Supplement acceptance, Diet advancement, Labs, Weight trends, Skin, I & O's  REASON FOR ASSESSMENT:   Malnutrition Screening Tool    ASSESSMENT:   82 y.o. male with medical history significant of HTN, CVA, afib on chronic afib, and Alz dementia;  who presents for complaints of right hip pain after fall. Patient seen have right intertrochanteric femur fracture on x-ray imaging  Pt NPO for surgery today. Pt reports usually having a good appetite PTA with usual consumption of at least 2 meals a day with Ensure at least 2 times daily. Usual body weight unknown to patient. Noted no new weight recorded. Recommend obtaining new weight to fully assess weight trends. Pt currently has Ensure ordered. RD to continue with current orders. Nursing staff to provide supplements once diet advances.   Nutrition-Focused physical exam completed. Findings are moderate to severe fat depletion, moderate to severe muscle depletion, and no edema.   Labs and mediations reviewed.   Diet Order:  Diet NPO time specified Except for: Sips with Meds  Skin:  Reviewed, no issues  Last BM:  Unknown  Height:   Ht Readings from Last 1 Encounters:  09/08/14 5\' 8"  (1.727 m)    Weight:   Wt Readings from Last 1 Encounters:  09/07/14 173 lb 15.1 oz (78.9 kg)    Ideal Body Weight:  70 kg  BMI:  There is no height or weight on file to calculate BMI.  Estimated Nutritional Needs:    Kcal:  1650-1850  Protein:  70-80 grams  Fluid:  1.6 - 1.8 L/day  EDUCATION NEEDS:   No education needs identified at this time  Corrin Parker, MS, RD, LDN Pager # [phone] After hours/ weekend pager # [phone]

## 2016-06-05 NOTE — Brief Op Note (Signed)
06/04/2016 - 06/05/2016  9:06 PM  PATIENT:  Kurt Thomas  82 y.o. male  PRE-OPERATIVE DIAGNOSIS:  Right Hip Fracture  POST-OPERATIVE DIAGNOSIS:  Right Hip Fracture  PROCEDURE:  Procedure(s): INTRAMEDULLARY (IM) NAIL FEMORAL (Right)  SURGEON:  Surgeon(s) and Role:    * Mcarthur Rossetti, MD - Primary  ANESTHESIA:   general  EBL:  Total I/O In: 1000 [I.V.:1000] Out: 50 [Blood:50]  COUNTS:  YES  PLAN OF CARE: Admit to inpatient   PATIENT DISPOSITION:  PACU - hemodynamically stable.   Delay start of Pharmacological VTE agent (>24hrs) due to surgical blood loss or risk of bleeding: no

## 2016-06-05 NOTE — Consult Note (Signed)
Reason for Consult:  Right hip fracture Referring Physician:   EDP  Kurt Thomas is an 82 y.o. male.  HPI:   82 yo male with dementia and other medical issues who sustained a mechanical fall yesterday.  Was admitted to [facility] per the hip fracture protocol with a right hip intertrochanteric fracture.  Ortho is consulted to address the fracture.  He does report right hip pain and responds to pain on exam.  He does follow commands, but also. does show his dementia  Past Medical History:  Diagnosis Date  . Alzheimer's disease, focal onset   . Atrial fibrillation ([place])   . CAD (coronary artery disease)   . Chronic atrial fibrillation ([place])   . High blood pressure   . Low back pain   . Memory loss   . Prostate cancer ([place])   . Recurrent major depression ([place])   . Stroke [facility])     Past Surgical History:  Procedure Laterality Date  . CATARACT EXTRACTION    . CORONARY ARTERY BYPASS GRAFT    . HIP ARTHROPLASTY Left 06/22/2014   Procedure: LEFT HEMIARTHROPLASTY HIP;  Surgeon: Meredith Pel, MD;  Location: [facility] ORS;  Service: Orthopedics;  Laterality: Left;  . PROSTATECTOMY      Family History  Problem Relation Age of Onset  . Cancer Father   . High blood pressure      Social History:  reports that he quit smoking about 59 years ago. His smoking use included Cigarettes. He has a 1.00 pack-year smoking history. He has never used smokeless tobacco. He reports that he does not drink alcohol or use drugs.  Allergies: No Known Allergies  Medications: I have reviewed the patient's current medications.  Results for orders placed or performed during the hospital encounter of 06/04/16 (from the past 48 hour(s))  Basic metabolic panel     Status: Abnormal   Collection Time: 06/04/16  5:48 PM  Result Value Ref Range   Sodium 137 135 - 145 mmol/L   Potassium 3.5 3.5 - 5.1 mmol/L   Chloride 104 101 - 111 mmol/L   CO2 24 22 - 32 mmol/L   Glucose, Bld 124 (H) 65 - 99 mg/dL   BUN 18 6 - 20  mg/dL   Creatinine, Ser 1.67 (H) 0.61 - 1.24 mg/dL   Calcium 9.2 8.9 - 10.3 mg/dL   GFR calc non Af Amer 33 (L) >60 mL/min   GFR calc Af Amer 38 (L) >60 mL/min    Comment: (NOTE) The eGFR has been calculated using the CKD EPI equation. This calculation has not been validated in all clinical situations. eGFR's persistently <60 mL/min signify possible Chronic Kidney Disease.    Anion gap 9 5 - 15  CBC with Differential     Status: Abnormal   Collection Time: 06/04/16  5:48 PM  Result Value Ref Range   WBC 5.9 4.0 - 10.5 K/uL   RBC 3.16 (L) 4.22 - 5.81 MIL/uL   Hemoglobin 9.4 (L) 13.0 - 17.0 g/dL   HCT 28.7 (L) 39.0 - 52.0 %   MCV 90.8 78.0 - 100.0 fL   MCH 29.7 26.0 - 34.0 pg   MCHC 32.8 30.0 - 36.0 g/dL   RDW 15.8 (H) 11.5 - 15.5 %   Platelets 197 150 - 400 K/uL   Neutrophils Relative % 75 %   Neutro Abs 4.4 1.7 - 7.7 K/uL   Lymphocytes Relative 13 %   Lymphs Abs 0.8 0.7 - 4.0 K/uL  Monocytes Relative 9 %   Monocytes Absolute 0.6 0.1 - 1.0 K/uL   Eosinophils Relative 3 %   Eosinophils Absolute 0.2 0.0 - 0.7 K/uL   Basophils Relative 0 %   Basophils Absolute 0.0 0.0 - 0.1 K/uL  Protime-INR     Status: Abnormal   Collection Time: 06/04/16  5:48 PM  Result Value Ref Range   Prothrombin Time 23.8 (H) 11.4 - 15.2 seconds   INR 2.09     Dg Chest 1 View  Result Date: 06/04/2016 CLINICAL DATA:  Fall, right hip pain, right femur fracture, initial encounter. EXAM: CHEST 1 VIEW COMPARISON:  06/20/2014. FINDINGS: Trachea is midline. Heart is enlarged. Devitalized wires overlie the heart. Mild patchy opacification in the lateral right midlung zone is new. Calcified granuloma in the left midlung zone. Lungs are otherwise clear. No pleural fluid. IMPRESSION: Mild patchy opacification in the lateral right midlung zone may be due to contusion and/or rib fractures. Electronically Signed   By: Lorin Picket M.D.   On: 06/04/2016 19:06   Ct Head Wo Contrast  Result Date:  06/04/2016 CLINICAL DATA:  Patient status post fall. No reported loss of consciousness. Head neck pain. EXAM: CT HEAD WITHOUT CONTRAST CT CERVICAL SPINE WITHOUT CONTRAST TECHNIQUE: Multidetector CT imaging of the head and cervical spine was performed following the standard protocol without intravenous contrast. Multiplanar CT image reconstructions of the cervical spine were also generated. COMPARISON:  Brain CT 03/21/2016. FINDINGS: CT HEAD FINDINGS Brain: Ventricles and sulci are prominent compatible with atrophy. Periventricular and subcortical white matter hypodensity compatible with chronic microvascular ischemic changes. Additionally encephalomalacia is demonstrated throughout the right cerebral hemisphere compatible with remote infarct. Old right cerebellar hemisphere infarct. No evidence for acute cortically based infarct, intracranial hemorrhage, mass lesion or mass-effect. Old left basal ganglia lacunar infarct. Vascular: Internal carotid arterial calcifications Skull: Intact. Sinuses/Orbits: Paranasal sinuses are well aerated. Mastoid air cells are unremarkable. Other: None. CT CERVICAL SPINE FINDINGS Alignment: Normal anatomic alignment. Skull base and vertebrae: Irregular lucency through the right aspect of the C1 ring (image 17; series 7). Soft tissues and spinal canal: Unremarkable. Disc levels: Multilevel degenerative disc disease most pronounced C3-4, C4-5 and C5-6. No acute cervical spine fracture. Upper chest: No consolidative pulmonary opacities. There is a 1.9 cm nodule within the thyroid (image 85; series 6). Other: None. IMPRESSION: Irregular lucency through the posterior right aspect of the C1 ring likely sequelae of remote trauma or potentially secondary to a lytic lesion in the setting of metastatic disease, felt to be less likely. No acute intracranial process. No definite acute cervical spine fracture. Cortical atrophy and old cortically based infarcts. Electronically Signed   By: Lovey Newcomer M.D.   On: 06/04/2016 18:53   Ct Cervical Spine Wo Contrast  Result Date: 06/04/2016 CLINICAL DATA:  Patient status post fall. No reported loss of consciousness. Head neck pain. EXAM: CT HEAD WITHOUT CONTRAST CT CERVICAL SPINE WITHOUT CONTRAST TECHNIQUE: Multidetector CT imaging of the head and cervical spine was performed following the standard protocol without intravenous contrast. Multiplanar CT image reconstructions of the cervical spine were also generated. COMPARISON:  Brain CT 03/21/2016. FINDINGS: CT HEAD FINDINGS Brain: Ventricles and sulci are prominent compatible with atrophy. Periventricular and subcortical white matter hypodensity compatible with chronic microvascular ischemic changes. Additionally encephalomalacia is demonstrated throughout the right cerebral hemisphere compatible with remote infarct. Old right cerebellar hemisphere infarct. No evidence for acute cortically based infarct, intracranial hemorrhage, mass lesion or mass-effect. Old left basal ganglia lacunar  infarct. Vascular: Internal carotid arterial calcifications Skull: Intact. Sinuses/Orbits: Paranasal sinuses are well aerated. Mastoid air cells are unremarkable. Other: None. CT CERVICAL SPINE FINDINGS Alignment: Normal anatomic alignment. Skull base and vertebrae: Irregular lucency through the right aspect of the C1 ring (image 17; series 7). Soft tissues and spinal canal: Unremarkable. Disc levels: Multilevel degenerative disc disease most pronounced C3-4, C4-5 and C5-6. No acute cervical spine fracture. Upper chest: No consolidative pulmonary opacities. There is a 1.9 cm nodule within the thyroid (image 85; series 6). Other: None. IMPRESSION: Irregular lucency through the posterior right aspect of the C1 ring likely sequelae of remote trauma or potentially secondary to a lytic lesion in the setting of metastatic disease, felt to be less likely. No acute intracranial process. No definite acute cervical spine fracture.  Cortical atrophy and old cortically based infarcts. Electronically Signed   By: Lovey Newcomer M.D.   On: 06/04/2016 18:53   Dg Hip Unilat With Pelvis 2-3 Views Right  Result Date: 06/04/2016 CLINICAL DATA:  Fall onto right side, right hip pain, initial encounter. EXAM: DG HIP (WITH OR WITHOUT PELVIS) 2-3V RIGHT COMPARISON:  The the FINDINGS: There is a comminuted intertrochanteric fracture of the right femur with varus angulation. No dislocation. Left hip arthroplasty. Surgical clips in the anatomic pelvis. IMPRESSION: Intertrochanteric right femur fracture. Electronically Signed   By: Lorin Picket M.D.   On: 06/04/2016 19:04    ROS Blood pressure 115/76, pulse 82, temperature 97.9 F (36.6 C), temperature source Oral, resp. rate 16, SpO2 (!) 88 %. Physical Exam  Constitutional: He appears well-developed.  HENT:  Head: Normocephalic.  Eyes: Pupils are equal, round, and reactive to light.  Neck: Normal range of motion.  Cardiovascular: Normal rate.   Respiratory: Effort normal.  GI: Soft. Bowel sounds are normal.  Musculoskeletal:       Right hip: He exhibits decreased range of motion, decreased strength, tenderness, bony tenderness and deformity.  Neurological: He is alert.  Skin: Skin is warm and dry.    Assessment/Plan: Right intertrochanteric femur/hip fracture 1)  I will speak to his daughter about surgery this evening.  Awaiting labs to have INR below 2.  Surgery is indicated for pain control, quality of life and mobility.  He does ambulate with a walker.  Will discuss risks and benefits in detail with his daughter.  Mcarthur Rossetti 06/05/2016, 6:31 AM

## 2016-06-06 ENCOUNTER — Encounter (HOSPITAL_COMMUNITY): Payer: Self-pay | Admitting: Orthopaedic Surgery

## 2016-06-06 LAB — CBC
HCT: 26.7 % — ABNORMAL LOW (ref 39.0–52.0)
Hemoglobin: 8.5 g/dL — ABNORMAL LOW (ref 13.0–17.0)
MCH: 29.6 pg (ref 26.0–34.0)
MCHC: 31.8 g/dL (ref 30.0–36.0)
MCV: 93 fL (ref 78.0–100.0)
PLATELETS: 162 10*3/uL (ref 150–400)
RBC: 2.87 MIL/uL — AB (ref 4.22–5.81)
RDW: 15.6 % — ABNORMAL HIGH (ref 11.5–15.5)
WBC: 10.9 10*3/uL — ABNORMAL HIGH (ref 4.0–10.5)

## 2016-06-06 LAB — BASIC METABOLIC PANEL
Anion gap: 7 (ref 5–15)
BUN: 19 mg/dL (ref 6–20)
CO2: 27 mmol/L (ref 22–32)
CREATININE: 1.67 mg/dL — AB (ref 0.61–1.24)
Calcium: 8.8 mg/dL — ABNORMAL LOW (ref 8.9–10.3)
Chloride: 105 mmol/L (ref 101–111)
GFR calc Af Amer: 38 mL/min — ABNORMAL LOW (ref >60)
GFR, EST NON AFRICAN AMERICAN: 33 mL/min — AB (ref >60)
Glucose, Bld: 116 mg/dL — ABNORMAL HIGH (ref 65–99)
POTASSIUM: 4.3 mmol/L (ref 3.5–5.1)
SODIUM: 139 mmol/L (ref 135–145)

## 2016-06-06 LAB — PROTIME-INR
INR: 2.52
PROTHROMBIN TIME: 27.7 s — AB (ref 11.4–15.2)

## 2016-06-06 MED ORDER — WARFARIN SODIUM 5 MG PO TABS
5.0000 mg | ORAL_TABLET | Freq: Once | ORAL | Status: AC
  Administered 2016-06-06: 5 mg via ORAL
  Filled 2016-06-06: qty 1

## 2016-06-06 MED ORDER — HYDROCODONE-ACETAMINOPHEN 5-325 MG PO TABS: 1.0000 | ORAL_TABLET | Freq: Four times a day (QID) | ORAL | 0 refills | Status: DC | PRN

## 2016-06-06 MED ORDER — WARFARIN - PHARMACIST DOSING INPATIENT: Freq: Every day | Status: DC

## 2016-06-06 NOTE — Progress Notes (Signed)
ANTICOAGULATION CONSULT NOTE - Follow Up Consult  Pharmacy Consult:  Coumadin Indication: atrial fibrillation  No Known Allergies   Vital Signs: Temp: 97.2 F (36.2 C) (11/28 0600) Temp Source: Oral (11/28 0600) BP: 127/93 (11/28 0851) Pulse Rate: 90 (11/28 0851)  Labs:  Recent Labs  06/04/16 1748 06/05/16 0547 06/06/16 0458 06/06/16 1128  HGB 9.4* 8.8* 8.5*  --   HCT 28.7* 26.9* 26.7*  --   PLT 197 178 162  --   LABPROT 23.8* 20.9*  --  27.7*  INR 2.09 1.77  --  2.52  CREATININE 1.67* 1.58* 1.67*  --     CrCl cannot be calculated (Unknown ideal weight.).    Assessment: 89 YOM on Coumadin PTA for history of AFib.  Patient's INR was reversed for IM nail on 06/05/16.  Coumadin was resumed and the dose was given earlier this AM.  Patient's INR increased significantly to therapeutic level today.  No bleeding reported.   Goal of Therapy:  INR 2-3    Plan:  - Hold Coumadin today - Daily PT / INR   Bonney Berres D. Mina Marble, PharmD, BCPS Pager:  [phone] 06/06/2016, 1:01 PM

## 2016-06-06 NOTE — Progress Notes (Signed)
Subjective: 1 Day Post-Op Procedure(s) (LRB): INTRAMEDULLARY (IM) NAIL FEMORAL (Right) Patient reports pain as mild.  Acute blood loss anemia from surgery and fracture, but minimal change from pre-op to post-op.  Vitals stable.  Objective: Vital signs in last 24 hours: Temp:  [97.2 F (36.2 C)-98.1 F (36.7 C)] 97.2 F (36.2 C) (11/28 0600) Pulse Rate:  [76-91] 90 (11/28 0851) Resp:  [15-24] 24 (11/27 2315) BP: (110-147)/(58-93) 127/93 (11/28 0851) SpO2:  [88 %-96 %] 92 % (11/28 0600)  Intake/Output from previous day: 11/27 0701 - 11/28 0700 In: 1340 [P.O.:240; I.V.:1000; IV Piggyback:100] Out: 400 [Urine:350; Blood:50] Intake/Output this shift: Total I/O In: 180 [P.O.:180] Out: -    Recent Labs  06/04/16 1748 06/05/16 0547 06/06/16 0458  HGB 9.4* 8.8* 8.5*    Recent Labs  06/05/16 0547 06/06/16 0458  WBC 5.1 10.9*  RBC 2.91* 2.87*  HCT 26.9* 26.7*  PLT 178 162    Recent Labs  06/05/16 0547 06/06/16 0458  NA 138 139  K 3.6 4.3  CL 105 105  CO2 24 27  BUN 19 19  CREATININE 1.58* 1.67*  GLUCOSE 119* 116*  CALCIUM 9.0 8.8*    Recent Labs  06/05/16 0547 06/06/16 1128  INR 1.77 2.52    Intact pulses distally Dorsiflexion/Plantar flexion intact Incision: scant drainage  Assessment/Plan: 1 Day Post-Op Procedure(s) (LRB): INTRAMEDULLARY (IM) NAIL FEMORAL (Right) Up with therapy Discharge to SNF when medically clear. Coumadin resumed.  Mcarthur Rossetti 06/06/2016, 12:26 PM

## 2016-06-06 NOTE — Discharge Instructions (Addendum)
Information on my medicine - Coumadin®   (Warfarin) ° °This medication education was reviewed with me or my healthcare representative as part of my discharge preparation.  The pharmacist that spoke with me during my hospital stay was:  Dang, Thuy Dien, RPH ° °Why was Coumadin prescribed for you? °Coumadin was prescribed for you because you have a blood clot or a medical condition that can cause an increased risk of forming blood clots. Blood clots can cause serious health problems by blocking the flow of blood to the heart, lung, or brain. Coumadin can prevent harmful blood clots from forming. °As a reminder your indication for Coumadin is:   Stroke Prevention Because Of Atrial Fibrillation ° °What test will check on my response to Coumadin? °While on Coumadin (warfarin) you will need to have an INR test regularly to ensure that your dose is keeping you in the desired range. The INR (international normalized ratio) number is calculated from the result of the laboratory test called prothrombin time (PT). ° °If an INR APPOINTMENT HAS NOT ALREADY BEEN MADE FOR YOU please schedule an appointment to have this lab work done by your health care provider within 7 days. °Your INR goal is usually a number between:  2 to 3 or your provider may give you a more narrow range like 2-2.5.  Ask your health care provider during an office visit what your goal INR is. ° °What  do you need to  know  About  COUMADIN? °Take Coumadin (warfarin) exactly as prescribed by your healthcare provider about the same time each day.  DO NOT stop taking without talking to the doctor who prescribed the medication.  Stopping without other blood clot prevention medication to take the place of Coumadin may increase your risk of developing a new clot or stroke.  Get refills before you run out. ° °What do you do if you miss a dose? °If you miss a dose, take it as soon as you remember on the same day then continue your regularly scheduled regimen the next  day.  Do not take two doses of Coumadin at the same time. ° °Important Safety Information °A possible side effect of Coumadin (Warfarin) is an increased risk of bleeding. You should call your healthcare provider right away if you experience any of the following: °? Bleeding from an injury or your nose that does not stop. °? Unusual colored urine (red or dark brown) or unusual colored stools (red or black). °? Unusual bruising for unknown reasons. °? A serious fall or if you hit your head (even if there is no bleeding). ° °Some foods or medicines interact with Coumadin® (warfarin) and might alter your response to warfarin. To help avoid this: °? Eat a balanced diet, maintaining a consistent amount of Vitamin K. °? Notify your provider about major diet changes you plan to make. °? Avoid alcohol or limit your intake to 1 drink for women and 2 drinks for men per day. °(1 drink is 5 oz. wine, 12 oz. beer, or 1.5 oz. liquor.) ° °Make sure that ANY health care provider who prescribes medication for you knows that you are taking Coumadin (warfarin).  Also make sure the healthcare provider who is monitoring your Coumadin knows when you have started a new medication including herbals and non-prescription products. ° °Coumadin® (Warfarin)  Major Drug Interactions  °Increased Warfarin Effect Decreased Warfarin Effect  °Alcohol (large quantities) °Antibiotics (esp. Septra/Bactrim, Flagyl, Cipro) °Amiodarone (Cordarone) °Aspirin (ASA) °Cimetidine (Tagamet) °Megestrol (Megace) °NSAIDs (ibuprofen,   naproxen, etc.) Piroxicam (Feldene) Propafenone (Rythmol SR) Propranolol (Inderal) Isoniazid (INH) Posaconazole (Noxafil) Barbiturates (Phenobarbital) Carbamazepine (Tegretol) Chlordiazepoxide (Librium) Cholestyramine (Questran) Griseofulvin Oral Contraceptives Rifampin Sucralfate (Carafate) Vitamin K   Coumadin (Warfarin) Major Herbal Interactions  Increased Warfarin Effect Decreased Warfarin Effect   Garlic Ginseng Ginkgo biloba Coenzyme Q10 Green tea St. Johns wort    Coumadin (Warfarin) FOOD Interactions  Eat a consistent number of servings per week of foods HIGH in Vitamin K (1 serving =  cup)  Collards (cooked, or boiled & drained) Kale (cooked, or boiled & drained) Mustard greens (cooked, or boiled & drained) Parsley *serving size only =  cup Spinach (cooked, or boiled & drained) Swiss chard (cooked, or boiled & drained) Turnip greens (cooked, or boiled & drained)  Eat a consistent number of servings per week of foods MEDIUM-HIGH in Vitamin K (1 serving = 1 cup)  Asparagus (cooked, or boiled & drained) Broccoli (cooked, boiled & drained, or raw & chopped) Brussel sprouts (cooked, or boiled & drained) *serving size only =  cup Lettuce, raw (green leaf, endive, romaine) Spinach, raw Turnip greens, raw & chopped   These websites have more information on Coumadin (warfarin):  FailFactory.se; VeganReport.com.au;  Can put full weight on right hip as comfort allows. Can change dressing every-other-day or as needed right hip incision.

## 2016-06-06 NOTE — Progress Notes (Addendum)
Patient ID: Kurt Thomas, male   DOB: 04-15-1919, 82 y.o.   MRN: [id number]    PROGRESS NOTE  Kurt Thomas  [id number] DOB: [birth year] DOA: 06/04/2016  PCP: Melinda Crutch, MD   Brief Narrative:  82 y.o. male with medical history significant of HTN, CVA, afib on chronic afib, and Alz dementia;  who presented for complaints of right hip pain after fall.   ED Course:  Upon admission to the emergency department patient was evaluated and seen to be afebrile with vitals relatively within normal limits except for some O2 sats in the upper 80s after pain medications given. Lab work revealed INR of 2.24 and otherwise appeared near his baseline. Dr. Ninfa Linden of orthopedics evaluated the patient and recommended giving the patient vitamin K, plan for surgery 11/27.   Assessment & Plan:   Principal Problem: Right intertrochanteric femur/hip fracture - pt is s/p INTRAMEDULLARY (IM) NAIL FEMORAL (Right), post op day #1 - appreciate ortho team following  - allow analgesia as needed  - PT eval done, SNF recommended, SW consulted for assistance - resumed Coumadin   Active Problems: Atrial fibrillation on chronic anticoagulation therapy - Heart rates well controlled at this time. Initial INR noted to be 2.25 - coumadin per pharmacy   Essential hypertension - Continue Coreg, BiDil, and amlodipine  - pt also on Hydralazine but will stop that as SBP is in low 100's - reasonable inpatient control    Chronic kidney disease stage III - Continue to monitor as Cr is slightly up this AM - encouraged PO intake  - BMP In AM  Anemia of chronic disease - no signs of active bleeding, Hg overall stable  - CBC in AM  Dementia - Continue Aricept  Glaucoma - Continue latanpoprost, Combigan  BPH - Continue Flomax  GERD - continue PPI  Underweight, BMI < 17, severe PCM in the context of chronic illness  - nutritionist consulted   DVT prophylaxis: SCD's, Warfarin  Code Status: Full  Family  Communication: Patient at bedside  Disposition Plan: SNF in 1-2 days if Hg and Cr stable   Consultants:   Ortho   Procedures:   Right hip repair 11/27 -->  Antimicrobials:   None   Subjective: No events overnight.   Objective: Vitals:   06/05/16 2300 06/05/16 2315 06/06/16 0050 06/06/16 0600  BP:   123/69 125/60  Pulse: 86 83 90 91  Resp: 15 (!) 24    Temp:  97.2 F (36.2 C) 97.7 F (36.5 C) 97.2 F (36.2 C)  TempSrc:   Oral Oral  SpO2: 96% 96%  92%    Intake/Output Summary (Last 24 hours) at 06/06/16 0701 Last data filed at 06/06/16 0600  Gross per 24 hour  Intake             1340 ml  Output              400 ml  Net              940 ml   There were no vitals filed for this visit.  Examination:  General exam: Appears calm and comfortable. HOH Respiratory system: Clear to auscultation. Respiratory effort normal. Cardiovascular system: S1 & S2 heard, RRR. No JVD, rubs, gallops or clicks. No pedal edema. Gastrointestinal system: Abdomen is nondistended, soft and nontender. No organomegaly or masses felt. Normal bowel sounds heard. Central nervous system: Alert and oriented. No focal neurological deficits.  Data Reviewed: I have personally reviewed following labs and imaging studies  CBC:  Recent Labs Lab 06/04/16 1748 06/05/16 0547 06/06/16 0458  WBC 5.9 5.1 10.9*  NEUTROABS 4.4 3.6  --   HGB 9.4* 8.8* 8.5*  HCT 28.7* 26.9* 26.7*  MCV 90.8 92.4 93.0  PLT 197 178 0000000   Basic Metabolic Panel:  Recent Labs Lab 06/04/16 1748 06/05/16 0547 06/06/16 0458  NA 137 138 139  K 3.5 3.6 4.3  CL 104 105 105  CO2 24 24 27   GLUCOSE 124* 119* 116*  BUN 18 19 19   CREATININE 1.67* 1.58* 1.67*  CALCIUM 9.2 9.0 8.8*   Coagulation Profile:  Recent Labs Lab 06/04/16 1748 06/05/16 0547  INR 2.09 1.77    Recent Results (from the past 240 hour(s))  MRSA PCR Screening     Status: Abnormal   Collection Time: 06/05/16  5:56 AM  Result Value Ref Range  Status   MRSA by PCR POSITIVE (A) NEGATIVE Final    Radiology Studies: Dg Chest 1 View Result Date: 06/04/2016 Mild patchy opacification in the lateral right midlung zone may be due to contusion and/or rib fractures.   Ct Head Wo Contrast Result Date: 06/04/2016 Irregular lucency through the posterior right aspect of the C1 ring likely sequelae of remote trauma or potentially secondary to a lytic lesion in the setting of metastatic disease, felt to be less likely. No acute intracranial process. No definite acute cervical spine fracture. Cortical atrophy and old cortically based infarcts.   Ct Cervical Spine Wo Contrast Result Date: 06/04/2016 Irregular lucency through the posterior right aspect of the C1 ring likely sequelae of remote trauma or potentially secondary to a lytic lesion in the setting of metastatic disease, felt to be less likely. No acute intracranial process. No definite acute cervical spine fracture. Cortical atrophy and old cortically based infarcts.   Dg Hip Unilat With Pelvis 2-3 Views Right Result Date: 06/04/2016 Intertrochanteric right femur fracture.   Scheduled Meds: . amLODipine  10 mg Oral Daily  . brimonidine  1 drop Both Eyes BID  . carvedilol  12.5 mg Oral BID WC  .  ceFAZolin (ANCEF) IV  2 g Intravenous Q6H  . Chlorhexidine Gluconate Cloth  6 each Topical Q0600  . donepezil  10 mg Oral QHS  . DULoxetine  30 mg Oral Daily  . famotidine  20 mg Oral Daily  . feeding supplement (ENSURE ENLIVE)  237 mL Oral TID BM  . fluticasone  2 spray Each Nare QHS  . hydrALAZINE  50 mg Oral BID  . isosorbide-hydrALAZINE  1 tablet Oral BID  . latanoprost  1 drop Both Eyes QHS  . mouth rinse  15 mL Mouth Rinse BID  . mirabegron ER  25 mg Oral Daily  . mupirocin ointment  1 application Nasal BID  . polyethylene glycol  17 g Oral BID  . tamsulosin  0.4 mg Oral QPM  . timolol  1 drop Both Eyes BID  . Warfarin - Pharmacist Dosing Inpatient   Does not apply q1800    Continuous Infusions: . sodium chloride 50 mL/hr at 06/06/16 0123     LOS: 2 days   Time spent: 20 minutes   Kurt Ramsay, MD [facility] Pager [phone]  If 7PM-7AM, please contact night-coverage [email or web address] Password [email or web address] 06/06/2016, 7:01 AM

## 2016-06-06 NOTE — Evaluation (Signed)
Physical Therapy Evaluation Patient Details Name: Kurt Thomas MRN: [id number] DOB: Jan 31, 1919 Today's Date: 06/06/2016   History of Present Illness  Kurt Thomas a 82 y.o.malewith medical history significant of HTN, CVA, afib on chronic afib, and Alz dementia; who presents for complaints of right hip pain after fall. Hehad been trying to sit down in his recliner When he missed the chair falling ontohis right side on the floor. Pt s/p IM nail of R hip on 11/27. Patient had similar incident happened when he fell and fractured his left hip 2 years ago, but recovered very quickly.  Clinical Impression  Pt was amb with RW without assist but now requires assistx2 for all mobility. Pt to benefit from ST-SNF upon d/c to achieve safe mod I level of function for transition back to ALF.    Follow Up Recommendations SNF;Supervision/Assistance - 24 hour    Equipment Recommendations  None recommended by PT    Recommendations for Other Services       Precautions / Restrictions Precautions Precautions: Fall Precaution Comments: dementia Restrictions Weight Bearing Restrictions: Yes RLE Weight Bearing: Weight bearing as tolerated      Mobility  Bed Mobility Overal bed mobility: Needs Assistance;+2 for physical assistance Bed Mobility: Supine to Sit     Supine to sit: Max assist;+2 for physical assistance     General bed mobility comments: pt initiated with UEs but required max verbal and tactile cues to complete task  Transfers Overall transfer level: Needs assistance Equipment used: 2 person hand held assist (2 person lift with gait belt) Transfers: Sit to/from Stand;Stand Pivot Transfers Sit to Stand: Max assist;+2 physical assistance Stand pivot transfers: Max assist;+2 physical assistance       General transfer comment: attempted to use RW since he's familar with it however pt unable to achieve full standing. Pt did attempt to take steps to chair but required maxA to weight  shift and advance LEs. Pt did moan/groan during mobility due to R LE pain  Ambulation/Gait             General Gait Details: unable at this time  Financial trader Rankin (Stroke Patients Only)       Balance Overall balance assessment: Needs assistance Sitting-balance support: Feet supported;Bilateral upper extremity supported Sitting balance-Leahy Scale: Poor Sitting balance - Comments: pt unable to maintain midline, pt with R lateral lean, tactile cues provide to maintain balance   Standing balance support: Bilateral upper extremity supported Standing balance-Leahy Scale: Poor Standing balance comment: requires external assist                             Pertinent Vitals/Pain Pain Assessment: Faces Faces Pain Scale: Hurts even more Pain Location: R hip with movement Pain Intervention(s): Monitored during session    Home Living Family/patient expects to be discharged to:: Skilled nursing facility                 Additional Comments: pt resides at [facility]    Prior Function Level of Independence: Needs assistance   Gait / Transfers Assistance Needed: pt uses walker and amb to/from dinning all  ADL's / Homemaking Assistance Needed: assist for bathing        Hand Dominance   Dominant Hand: Right    Extremity/Trunk Assessment   Upper Extremity Assessment: Generalized weakness  Lower Extremity Assessment: RLE deficits/detail;LLE deficits/detail RLE Deficits / Details: onset of pain with all movement, can voluntarily move R ankle LLE Deficits / Details: generalized weakness  Cervical / Trunk Assessment: Kyphotic  Communication   Communication: HOH  Cognition Arousal/Alertness: Awake/alert Behavior During Therapy: Flat affect Overall Cognitive Status: Impaired/Different from baseline (pt with noted alzheimers) Area of Impairment:  (extremely poor re-call)     Memory:  Decreased short-term memory              General Comments General comments (skin integrity, edema, etc.): pt with multiple skintares and abrasions    Exercises     Assessment/Plan    PT Assessment Patient needs continued PT services  PT Problem List Decreased strength;Decreased range of motion;Decreased activity tolerance;Decreased balance;Decreased mobility;Decreased coordination;Decreased cognition;Decreased knowledge of use of DME;Decreased safety awareness;Pain          PT Treatment Interventions DME instruction;Gait training;Functional mobility training;Therapeutic activities;Therapeutic exercise;Balance training    PT Goals (Current goals can be found in the Care Plan section)  Acute Rehab PT Goals Patient Stated Goal: didn't state PT Goal Formulation: Patient unable to participate in goal setting Time For Goal Achievement: 06/13/16 Potential to Achieve Goals: Good    Frequency Min 5X/week   Barriers to discharge Other (comment) needs to be at mod I level to returnt o ALF    Co-evaluation               End of Session Equipment Utilized During Treatment: Gait belt Activity Tolerance: Patient limited by pain Patient left: in chair;with call bell/phone within reach;with chair alarm set Nurse Communication: Mobility status         Time: [id number] PT Time Calculation (min) (ACUTE ONLY): 22 min   Charges:   PT Evaluation $PT Eval Moderate Complexity: 1 Procedure     PT G Codes:        Torsten Weniger M Jaleia Hanke 06/06/2016, 2:02 PM   Kittie Plater, PT, DPT Pager #: [phone] Office #: [phone]

## 2016-06-06 NOTE — Progress Notes (Signed)
ANTICOAGULATION CONSULT NOTE - Initial Consult  Pharmacy Consult for Coumadin Indication: VTE prophylaxis  No Known Allergies  Patient Measurements:    Vital Signs: Temp: 97.2 F (36.2 C) (11/27 2315) Temp Source: Oral (11/27 1411) BP: 147/58 (11/27 2253) Pulse Rate: 83 (11/27 2315)  Labs:  Recent Labs  06/04/16 1748 06/05/16 0547  HGB 9.4* 8.8*  HCT 28.7* 26.9*  PLT 197 178  LABPROT 23.8* 20.9*  INR 2.09 1.77  CREATININE 1.67* 1.58*    CrCl cannot be calculated (Unknown ideal weight.).   Medical History: Past Medical History:  Diagnosis Date  . Alzheimer's disease, focal onset   . Atrial fibrillation ([place])   . CAD (coronary artery disease)   . Chronic atrial fibrillation ([place])   . High blood pressure   . Low back pain   . Memory loss   . Prostate cancer ([place])   . Recurrent major depression ([place])   . Stroke [facility])     Medications:  No current facility-administered medications on file prior to encounter.    Current Outpatient Prescriptions on File Prior to Encounter  Medication Sig Dispense Refill  . albuterol (PROVENTIL) (2.5 MG/3ML) 0.083% nebulizer solution Take 2.5 mg by nebulization every 6 (six) hours as needed for wheezing.     Marland Kitchen amLODipine (NORVASC) 10 MG tablet Take 1 tablet (10 mg total) by mouth daily. 30 tablet 2  . carvedilol (COREG) 12.5 MG tablet Take 12.5 mg by mouth 2 (two) times daily with a meal.    . donepezil (ARICEPT) 10 MG tablet Take 10 mg by mouth at bedtime.    . isosorbide-hydrALAZINE (BIDIL) 20-37.5 MG per tablet Take 1 tablet by mouth 2 (two) times daily.    . polyethylene glycol (MIRALAX / GLYCOLAX) packet Take 17 g by mouth 2 (two) times daily. Mix in 8 oz liquid and drink    . warfarin (COUMADIN) 5 MG tablet Take 1.5 tablets (7.5 mg total) by mouth daily. To be taken for the next 2 days; then will require INR level and further coumadin adjustments. (Patient taking differently: Take 2.5 mg by mouth daily at 6 PM. )    .  docusate sodium (COLACE) 100 MG capsule Take 1 capsule (100 mg total) by mouth daily as needed for mild constipation. (Patient not taking: Reported on 06/04/2016) 30 capsule 0  . DULoxetine (CYMBALTA) 20 MG capsule Take 1 capsule (20 mg total) by mouth daily. (Patient not taking: Reported on 06/04/2016) 30 capsule 11  . feeding supplement, ENSURE COMPLETE, (ENSURE COMPLETE) LIQD Take 237 mLs by mouth 2 (two) times daily between meals. (Patient not taking: Reported on 06/04/2016) 10 Bottle 0  . Ferrous Fumarate 150 MG TABS Take 1 tablet (150 mg total) by mouth 2 (two) times daily. (Patient not taking: Reported on 06/04/2016)    . HYDROcodone-acetaminophen (NORCO/VICODIN) 5-325 MG per tablet Take 1 tablet by mouth every 6 (six) hours as needed for severe pain. (Patient not taking: Reported on 06/04/2016) 30 tablet 0     Assessment: 82 y.o. male with h/o Afib on Coumadin admitted with right hip fracture, s/p repair, to resume anticoagulation .  Vitamin K 5 mg IV given 11/26   Goal of Therapy:  INR 2-3 Monitor platelets by anticoagulation protocol: Yes   Plan:  Coumadin 5 mg tonight Daily INR  Caryl Pina 06/06/2016,12:03 AM

## 2016-06-06 NOTE — Anesthesia Postprocedure Evaluation (Signed)
Anesthesia Post Note  Patient: Naaman Manlove  Procedure(s) Performed: Procedure(s) (LRB): INTRAMEDULLARY (IM) NAIL FEMORAL (Right)  Patient location during evaluation: PACU Anesthesia Type: General Level of consciousness: awake and alert Pain management: pain level controlled Vital Signs Assessment: post-procedure vital signs reviewed and stable Respiratory status: spontaneous breathing, nonlabored ventilation, respiratory function stable and patient connected to nasal cannula oxygen Cardiovascular status: blood pressure returned to baseline and stable Postop Assessment: no signs of nausea or vomiting Anesthetic complications: no    Last Vitals:  Vitals:   06/05/16 2300 06/05/16 2315  BP:    Pulse: 86 83  Resp: 15 (!) 24  Temp:  36.2 C    Last Pain:  Vitals:   06/05/16 2105  TempSrc:   PainSc: 0-No pain                 Effie Berkshire

## 2016-06-07 ENCOUNTER — Encounter (HOSPITAL_COMMUNITY): Payer: Self-pay

## 2016-06-07 DIAGNOSIS — I481 Persistent atrial fibrillation: Secondary | ICD-10-CM

## 2016-06-07 DIAGNOSIS — N400 Enlarged prostate without lower urinary tract symptoms: Secondary | ICD-10-CM

## 2016-06-07 DIAGNOSIS — N183 Chronic kidney disease, stage 3 (moderate): Secondary | ICD-10-CM

## 2016-06-07 LAB — BASIC METABOLIC PANEL
ANION GAP: 8 (ref 5–15)
BUN: 25 mg/dL — ABNORMAL HIGH (ref 6–20)
CALCIUM: 8.7 mg/dL — AB (ref 8.9–10.3)
CHLORIDE: 105 mmol/L (ref 101–111)
CO2: 25 mmol/L (ref 22–32)
Creatinine, Ser: 1.78 mg/dL — ABNORMAL HIGH (ref 0.61–1.24)
GFR calc non Af Amer: 31 mL/min — ABNORMAL LOW (ref >60)
GFR, EST AFRICAN AMERICAN: 36 mL/min — AB (ref >60)
GLUCOSE: 110 mg/dL — AB (ref 65–99)
POTASSIUM: 4 mmol/L (ref 3.5–5.1)
Sodium: 138 mmol/L (ref 135–145)

## 2016-06-07 LAB — PROTIME-INR
INR: 3.83
Prothrombin Time: 38.7 seconds — ABNORMAL HIGH (ref 11.4–15.2)

## 2016-06-07 LAB — PREPARE RBC (CROSSMATCH)

## 2016-06-07 LAB — CBC
HEMATOCRIT: 23 % — AB (ref 39.0–52.0)
HEMOGLOBIN: 7.6 g/dL — AB (ref 13.0–17.0)
MCH: 30.2 pg (ref 26.0–34.0)
MCHC: 33 g/dL (ref 30.0–36.0)
MCV: 91.3 fL (ref 78.0–100.0)
Platelets: 142 10*3/uL — ABNORMAL LOW (ref 150–400)
RBC: 2.52 MIL/uL — ABNORMAL LOW (ref 4.22–5.81)
RDW: 15.7 % — AB (ref 11.5–15.5)
WBC: 9.8 10*3/uL (ref 4.0–10.5)

## 2016-06-07 LAB — ABO/RH: ABO/RH(D): A POS

## 2016-06-07 MED ORDER — SODIUM CHLORIDE 0.9 % IV SOLN
Freq: Once | INTRAVENOUS | Status: AC
  Administered 2016-06-07: 19:00:00 via INTRAVENOUS

## 2016-06-07 NOTE — Op Note (Signed)
NAMEEGE, MAVITY NO.:  [id number]  MEDICAL RECORD NO.:  [id number]  LOCATION:                                 FACILITY:  PHYSICIAN:  Lind Guest. Ninfa Linden, M.D.DATE OF BIRTH:  1918-12-03  DATE OF PROCEDURE:  06/05/2016 DATE OF DISCHARGE:                              OPERATIVE REPORT   PREOPERATIVE DIAGNOSIS:  Right displaced intertrochanteric femur/hip fracture.  POSTOPERATIVE DIAGNOSIS:  Right displaced intertrochanteric femur/hip fracture.  PROCEDURE:  Open reduction and internal fixation of right intertrochanteric hip fracture using intramedullary nail and lag screw construct.  IMPLANTS:  Biomet 10 x 380 Affixus femoral nail with a 100 mm lag screw.  SURGEON:  Jean Rosenthal, MD .  ANESTHESIA:  General.  ANTIBIOTICS:  2 g of IV Ancef.  BLOOD LOSS:  50 mL.  COMPLICATIONS:  None.  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  He sustained a mechanical fall late yesterday evening sustaining a right hip intertrochanteric fracture.  We delayed him by 24 hours going to the operating room because he has been on Coumadin and his INR was above 2.  It is now below 2.  He and his family do wish to proceed with surgery.  He has had a previous hip fracture on his left side that was treated with a hemiarthroplasty earlier this year.  We went over x-rays had a thorough discussion of the risks and benefits of the surgery.  He is certainly at a heightened risk given his age and multiple comorbidities which can be seen in his medical record.  PROCEDURE DESCRIPTION:  After informed consent was obtained, appropriate right hip was marked.  He was brought to the operating room.  General anesthesia was obtained while he was on a stretcher.  A Foley catheter was placed.  Next, he was placed supine on the fracture table with the perineal post in place.  His right operative leg in in-line skeletal traction with  some slight internal rotation and the left hip in a well leg holder with abduction and flexion and appropriate padding in the popliteal area.  We then assessed his right hip under fluoroscopy and with traction internal rotation got into an acceptable reduced position. We then prepped his right hip with DuraPrep and sterile drapes.  Time- out was called and he was identified as correct patient and correct right hip.  We then made an incision just proximal to the greater trochanter and dissected down the tip of the greater trochanter.  We then used an initiating reamer to open up the femoral canal after a temporary guide pin was placed.  These were removed and then we easily placed a 10 x 380 femoral nail down the canal without needing to ream. Of note, we selected this nail preoperatively using the nail and a sterile box getting this length under fluoroscopy.  We then used the outrigger guide to make a separate lateral incision.  Through the lateral incision, I placed a temporary guide pin traversing the fracture into the femoral head and neck.  I put this in an inferior position possible on the AP view and  tried to head posterior for the strongest bone for him to hopefully prevent cut out.  I then took a measurement and drilled for a 100 mm lag screw.  I then placed a compression component after allowing traction off the leg.  We then placed the locking mechanism from the top.  We did not place any distal interlocking screws.  We then removed all instrumentation and irrigated the 2 small wounds with normal saline solution.  We closed the deep tissue with 0 Vicryl followed by 2-0 Vicryl in the subcutaneous tissue, and interrupted staples on the skin.  Xeroform and well-padded sterile dressing was applied.  He was taken off the fracture table, awakened, extubated and taken to the recovery room in stable condition.  All final counts were correct.  There were no complications  noted.     Lind Guest. Ninfa Linden, M.D.   ______________________________ Lind Guest. Ninfa Linden, M.D.    CYB/MEDQ  D:  06/05/2016  T:  06/06/2016  Job:  [id number]

## 2016-06-07 NOTE — Progress Notes (Signed)
PROGRESS NOTE    Kurt Thomas  [id number] DOB: 01-24-1919 DOA: 06/04/2016 PCP: Melinda Crutch, MD   Brief Narrative:  82 y.o.malewith medical history significant of HTN, CVA, afib on chronic afib, and Alz dementia; who presented for complaints of right hip pain after fall.   ED Course:Upon admission to the emergency department patient was evaluated and seen to be afebrile with vitals relatively within normal limits except for some O2 sats in the upper 80s after pain medications given. Lab work revealed INR of 2.24 and otherwise appeared near his baseline. Dr. Ninfa Linden of orthopedics evaluated the patient and recommended giving the patient vitamin K, plan for surgery 11/27.    Assessment & Plan:   Principal Problem:   Fractured hip, right, closed, initial encounter [facility]) Active Problems:   Atrial fibrillation ([place])   Essential hypertension   CKD (chronic kidney disease) stage 3, GFR 30-59 ml/min   Chronic anticoagulation   BPH (benign prostatic hyperplasia)   Right intertrochanteric femur/hip fracture - pt is s/p INTRAMEDULLARY (IM) NAIL FEMORAL (Right), post op day #1 - appreciate ortho team following  - allow analgesia as needed  - PT eval done, SNF recommended, SW consulted for assistance - resumed Coumadin   Atrial fibrillation on chronic anticoagulation therapy - Heart rates well controlled at this time. Initial INR noted to be 2.25 - coumadin per pharmacy  - INR of 3.83 today  Essential hypertension - Continue Coreg, BiDil, and amlodipine  - pt also on Hydralazine but will stop that as SBP is in low 100's - continue to monitor   Chronic kidney disease stage III - Continue to monitor as Cr is slightly up this AM - encouraged PO intake  - BMP In AM  Anemia of chronic disease - no signs of active bleeding  - CBC in AM - decrease in H/H today - only 77mL of blood loss noted on op report - INR of 3.8 today - FOBT ordered - will transfuse 1 unit  today  Dementia - Continue Aricept  Glaucoma - Continue latanpoprost, Combigan  BPH - Continue Flomax  GERD - continue PPI  Underweight, BMI < 17 - nutritionist consulted   DVT prophylaxis: SCD's, Warfarin  Code Status: Full  Family Communication: Patient at bedside  Disposition Plan: SNF in 1-2 days if Hg and Cr stable    Consultants:   Ortho  Procedures:   Right Hip Repair 11/27  Antimicrobials:   None    Subjective: Patient seen and evaluated.  He is confused but this appears to be his baseline. Patient in a good mood and voices he has no pain.  Will order transfusion for decreased H/H.  Objective: Vitals:   06/06/16 1808 06/06/16 2100 06/07/16 0438 06/07/16 1247  BP: (!) 115/58 (!) 93/57 (!) 130/59 (!) 101/59  Pulse: 90 90 84 82  Resp:  15 15 16   Temp:  97.9 F (36.6 C) 98 F (36.7 C) 98.7 F (37.1 C)  TempSrc:  Oral Oral Oral  SpO2:  97% 94% 93%    Intake/Output Summary (Last 24 hours) at 06/07/16 1551 Last data filed at 06/07/16 0900  Gross per 24 hour  Intake              860 ml  Output              400 ml  Net              460 ml   There were no vitals filed for this  visit.  Examination:  General exam: Appears calm and comfortable  Respiratory system: Clear to auscultation. Respiratory effort normal. Cardiovascular system: S1 & S2 heard, RRR. No JVD, murmurs, rubs, gallops or clicks. No pedal edema. Gastrointestinal system: Abdomen is nondistended, soft and nontender. No organomegaly or masses felt. Normal bowel sounds heard. Central nervous system: Alert and oriented to self. No focal neurological deficits. Extremities: Symmetric 5 x 5 power in upper extremities bilaterally.  Able to move lower extremities bilaterally Skin: No rashes, lesions or ulcers Psychiatry: Judgement and insight appear normal. Mood & affect appropriate.     Data Reviewed: I have personally reviewed following labs and imaging studies  CBC:  Recent  Labs Lab 06/04/16 1748 06/05/16 0547 06/06/16 0458 06/07/16 0438  WBC 5.9 5.1 10.9* 9.8  NEUTROABS 4.4 3.6  --   --   HGB 9.4* 8.8* 8.5* 7.6*  HCT 28.7* 26.9* 26.7* 23.0*  MCV 90.8 92.4 93.0 91.3  PLT 197 178 162 [id number]*   Basic Metabolic Panel:  Recent Labs Lab 06/04/16 1748 06/05/16 0547 06/06/16 0458 06/07/16 0438  NA 137 138 139 138  K 3.5 3.6 4.3 4.0  CL 104 105 105 105  CO2 24 24 27 25   GLUCOSE 124* 119* 116* 110*  BUN 18 19 19  25*  CREATININE 1.67* 1.58* 1.67* 1.78*  CALCIUM 9.2 9.0 8.8* 8.7*   GFR: CrCl cannot be calculated (Unknown ideal weight.). Liver Function Tests: No results for input(s): AST, ALT, ALKPHOS, BILITOT, PROT, ALBUMIN in the last 168 hours. No results for input(s): LIPASE, AMYLASE in the last 168 hours. No results for input(s): AMMONIA in the last 168 hours. Coagulation Profile:  Recent Labs Lab 06/04/16 1748 06/05/16 0547 06/06/16 1128 06/07/16 0438  INR 2.09 1.77 2.52 3.83   Cardiac Enzymes: No results for input(s): CKTOTAL, CKMB, CKMBINDEX, TROPONINI in the last 168 hours. BNP (last 3 results) No results for input(s): PROBNP in the last 8760 hours. HbA1C: No results for input(s): HGBA1C in the last 72 hours. CBG: No results for input(s): GLUCAP in the last 168 hours. Lipid Profile: No results for input(s): CHOL, HDL, LDLCALC, TRIG, CHOLHDL, LDLDIRECT in the last 72 hours. Thyroid Function Tests: No results for input(s): TSH, T4TOTAL, FREET4, T3FREE, THYROIDAB in the last 72 hours. Anemia Panel: No results for input(s): VITAMINB12, FOLATE, FERRITIN, TIBC, IRON, RETICCTPCT in the last 72 hours. Sepsis Labs: No results for input(s): PROCALCITON, LATICACIDVEN in the last 168 hours.  Recent Results (from the past 240 hour(s))  MRSA PCR Screening     Status: Abnormal   Collection Time: 06/05/16  5:56 AM  Result Value Ref Range Status   MRSA by PCR POSITIVE (A) NEGATIVE Final    Comment:        The GeneXpert MRSA Assay  (FDA approved for NASAL specimens only), is one component of a comprehensive MRSA colonization surveillance program. It is not intended to diagnose MRSA infection nor to guide or monitor treatment for MRSA infections. RESULT CALLED TO, READ BACK BY AND VERIFIED WITH: G ADAMS,RN AT 0813 06/05/16 BY L BENFIELD          Radiology Studies: Dg C-arm 1-60 Min  Result Date: 06/05/2016 CLINICAL DATA:  Right femoral intra medullary nail. EXAM: RIGHT FEMUR 2 VIEWS; DG C-ARM 61-120 MIN COMPARISON:  Right hip 06/04/2016 FINDINGS: Intraoperative fluoroscopy is obtained for surgical control purposes. Fluoroscopy time is recorded at 66 seconds. 9 spot fluoroscopic images are obtained. Spot fluoroscopic images obtained demonstrate internal reduction of comminuted inter trochanteric  fractures of the right hip and fixation with long femoral intra medullary rod and compression bolt. Fracture fragments appear to be in near anatomic alignment and position. Surgical hardware appears intact. Vascular calcifications in the soft tissues. IMPRESSION: Intraoperative fluoroscopy for surgical control purposes demonstrating internal fixation of inter trochanteric right proximal femoral fractures using intra medullary rod and compression screw. Electronically Signed   By: Lucienne Capers M.D.   On: 06/05/2016 22:43   Dg Femur, Min 2 Views Right  Result Date: 06/05/2016 CLINICAL DATA:  Right femoral intra medullary nail. EXAM: RIGHT FEMUR 2 VIEWS; DG C-ARM 61-120 MIN COMPARISON:  Right hip 06/04/2016 FINDINGS: Intraoperative fluoroscopy is obtained for surgical control purposes. Fluoroscopy time is recorded at 66 seconds. 9 spot fluoroscopic images are obtained. Spot fluoroscopic images obtained demonstrate internal reduction of comminuted inter trochanteric fractures of the right hip and fixation with long femoral intra medullary rod and compression bolt. Fracture fragments appear to be in near anatomic alignment and  position. Surgical hardware appears intact. Vascular calcifications in the soft tissues. IMPRESSION: Intraoperative fluoroscopy for surgical control purposes demonstrating internal fixation of inter trochanteric right proximal femoral fractures using intra medullary rod and compression screw. Electronically Signed   By: Lucienne Capers M.D.   On: 06/05/2016 22:43        Scheduled Meds: . amLODipine  10 mg Oral Daily  . brimonidine  1 drop Both Eyes BID  . carvedilol  12.5 mg Oral BID WC  . Chlorhexidine Gluconate Cloth  6 each Topical Q0600  . donepezil  10 mg Oral QHS  . DULoxetine  30 mg Oral Daily  . famotidine  20 mg Oral Daily  . feeding supplement (ENSURE ENLIVE)  237 mL Oral TID BM  . fluticasone  2 spray Each Nare QHS  . isosorbide-hydrALAZINE  1 tablet Oral BID  . latanoprost  1 drop Both Eyes QHS  . mouth rinse  15 mL Mouth Rinse BID  . mirabegron ER  25 mg Oral Daily  . mupirocin ointment  1 application Nasal BID  . polyethylene glycol  17 g Oral BID  . tamsulosin  0.4 mg Oral QPM  . timolol  1 drop Both Eyes BID  . Warfarin - Pharmacist Dosing Inpatient   Does not apply q1800   Continuous Infusions: . sodium chloride 50 mL/hr at 06/06/16 2116     LOS: 3 days    Time spent: 35 minutes    Loretha Stapler, MD [facility] Pager [phone]  If 7PM-7AM, please contact night-coverage [email or web address] Password [facility] 06/07/2016, 3:51 PM

## 2016-06-07 NOTE — Progress Notes (Signed)
OT Cancellation Note  Patient Details Name: Kurt Thomas MRN: [id number] DOB: 26-May-1919   Cancelled Treatment:    Reason Eval/Treat Not Completed: Other (comment) Pt is Medicare and current D/C plan is SNF. No apparent immediate acute care OT needs, therefore will defer OT to SNF. If OT eval is needed please call Acute Rehab Dept. at [phone] or text page OT at [phone].    [place], OTR/L  [id number] 06/07/2016 06/07/2016, 9:48 AM

## 2016-06-07 NOTE — Progress Notes (Signed)
Physical Therapy Treatment Patient Details Name: Kurt Thomas MRN: [id number] DOB: 11-06-1918 Today's Date: 06/07/2016    History of Present Illness Kurt Thomas a 82 y.o.malewith medical history significant of HTN, CVA, afib on chronic afib, and Alz dementia; who presents for complaints of right hip pain after fall. Hehad been trying to sit down in his recliner When he missed the chair falling ontohis right side on the floor. Pt s/p IM nail of R hip on 11/27. Patient had similar incident happened when he fell and fractured his left hip 2 years ago, but recovered very quickly.    PT Comments    Patient continues to require max A +2 for mobility. Pt pleasant and willing to participate in therapy. Continue to progress as tolerated with anticipated d/c to SNF for further skilled PT services.    Follow Up Recommendations  SNF;Supervision/Assistance - 24 hour     Equipment Recommendations  None recommended by PT    Recommendations for Other Services       Precautions / Restrictions Precautions Precautions: Fall Precaution Comments: dementia Restrictions Weight Bearing Restrictions: Yes RLE Weight Bearing: Weight bearing as tolerated    Mobility  Bed Mobility Overal bed mobility: Needs Assistance;+2 for physical assistance Bed Mobility: Supine to Sit     Supine to sit: Max assist;+2 for physical assistance     General bed mobility comments: assist to bring bilat LE to EOB and elevate trunk into sitting; cues for sequencing/technique; use of rails and bed pad  Transfers Overall transfer level: Needs assistance Equipment used: Rolling walker (2 wheeled) Transfers: Sit to/from Stand Sit to Stand: Max assist;+2 physical assistance         General transfer comment: sit to stands X2 from EOB with max A +2 and RW; verbal and tactile cues for hand placement, posture, and technique; pt unable to pivot feet and recliner brought up behind pt  Ambulation/Gait              General Gait Details: unable at this time   Stairs            Wheelchair Mobility    Modified Rankin (Stroke Patients Only)       Balance     Sitting balance-Leahy Scale: Poor Sitting balance - Comments: pt unable to maintain midline, pt with R lateral lean, tactile cues provide to maintain balance     Standing balance-Leahy Scale: Poor                      Cognition Arousal/Alertness: Awake/alert Behavior During Therapy: Flat affect Overall Cognitive Status: History of cognitive impairments - at baseline       Memory: Decreased short-term memory              Exercises      General Comments        Pertinent Vitals/Pain Pain Assessment: Faces Faces Pain Scale: Hurts even more Pain Location: R hip with transitional movements (mostly bed mobility) Pain Descriptors / Indicators: Grimacing;Guarding;Moaning;Sore Pain Intervention(s): Limited activity within patient's tolerance;Monitored during session;Premedicated before session;Repositioned    Home Living                      Prior Function            PT Goals (current goals can now be found in the care plan section) Acute Rehab PT Goals Patient Stated Goal: go to sleep PT Goal Formulation: Patient unable to participate in goal setting  Time For Goal Achievement: 06/13/16 Potential to Achieve Goals: Good Progress towards PT goals: Progressing toward goals    Frequency    Min 5X/week      PT Plan Current plan remains appropriate    Co-evaluation             End of Session Equipment Utilized During Treatment: Gait belt Activity Tolerance: Patient limited by pain Patient left: in chair;with call bell/phone within reach;with chair alarm set     Time: [id number] PT Time Calculation (min) (ACUTE ONLY): 27 min  Charges:  $Therapeutic Activity: 23-37 mins                    G Codes:      Salina April, PTA Pager: [phone]   06/07/2016,  12:36 PM

## 2016-06-07 NOTE — Plan of Care (Signed)
Problem: Nutrition: Goal: Adequate nutrition will be maintained Outcome: Not Progressing Poor appetite, needs assistance and encouragement  Problem: Bowel/Gastric: Goal: Will not experience complications related to bowel motility Outcome: Progressing Medicated with Miralax  Problem: Pain Management: Goal: Pain level will decrease with appropriate interventions Outcome: Progressing Medicated with Tylenol for discomfort

## 2016-06-07 NOTE — Progress Notes (Signed)
Nutrition Follow-up  DOCUMENTATION CODES:   Severe malnutrition in context of chronic illness  INTERVENTION:  Continue Ensure Enlive po TID, each supplement provides 350 kcal and 20 grams of protein.  Encourage adequate PO intake.   NUTRITION DIAGNOSIS:   Malnutrition (severe) related to chronic illness as evidenced by severe depletion of body fat, severe depletion of muscle mass; ongoing  GOAL:   Patient will meet greater than or equal to 90% of their needs; progressing  MONITOR:   PO intake, Supplement acceptance, Labs, Weight trends, Skin, I & O's  REASON FOR ASSESSMENT:   Malnutrition Screening Tool    ASSESSMENT:   82 y.o. male with medical history significant of HTN, CVA, afib on chronic afib, and Alz dementia;  who presents for complaints of right hip pain after fall. Patient seen have right intertrochanteric femur fracture on x-ray imaging  PROCEDURE:  (11/27): INTRAMEDULLARY (IM) NAIL FEMORAL (Right)  Pt was unavailable during time of visit. Pt was busy working with therapy during attempted time of visit. Meal completion has been 25-50%. Pt currently has Ensure ordered TID and has been consuming them. RD to continue with current orders to aid in caloric and protein needs.   Labs and medications reviewed.   Diet Order:  Diet regular Room service appropriate? Yes; Fluid consistency: Thin  Skin:   (Incision on R hip)  Last BM:  Unknown  Height:   Ht Readings from Last 1 Encounters:  09/08/14 5\' 8"  (1.727 m)    Weight:   Wt Readings from Last 1 Encounters:  09/07/14 173 lb 15.1 oz (78.9 kg)    Ideal Body Weight:  70 kg  BMI:  There is no height or weight on file to calculate BMI.  Estimated Nutritional Needs:   Kcal:  1650-1850  Protein:  70-80 grams  Fluid:  1.6 - 1.8 L/day  EDUCATION NEEDS:   No education needs identified at this time  Corrin Parker, MS, RD, LDN Pager # [phone] After hours/ weekend pager # [phone]

## 2016-06-07 NOTE — Progress Notes (Signed)
ANTICOAGULATION CONSULT NOTE - Follow Up Consult  Pharmacy Consult:  Coumadin Indication: atrial fibrillation  No Known Allergies   Vital Signs: Temp: 98 F (36.7 C) (11/29 0438) Temp Source: Oral (11/29 0438) BP: 130/59 (11/29 0438) Pulse Rate: 84 (11/29 0438)  Labs:  Recent Labs  06/05/16 0547 06/06/16 0458 06/06/16 1128 06/07/16 0438  HGB 8.8* 8.5*  --  7.6*  HCT 26.9* 26.7*  --  23.0*  PLT 178 162  --  142*  LABPROT 20.9*  --  27.7* 38.7*  INR 1.77  --  2.52 3.83  CREATININE 1.58* 1.67*  --  1.78*    CrCl cannot be calculated (Unknown ideal weight.).    Assessment: 89 YOM on Coumadin PTA for history of AFib.  Patient's INR was reversed for IM nail on 06/05/16 with 5mg  of IV Vitamin K.  Coumadin was resumed 11/28 early AM. No further Coumadin was given 11/28.   INR supra-therapeutic today at 3.83.Marland Kitchen  H/H has decreased, Plts down to 142. No bleeding reported.    Goal of Therapy:  INR 2-3    Plan:  - Hold Coumadin again today - Daily PT / INR   Sloan Leiter, PharmD, BCPS Clinical Pharmacist [phone] until 3:30 PM today [phone] after hours 06/07/2016, 10:10 AM

## 2016-06-08 DIAGNOSIS — L8990 Pressure ulcer of unspecified site, unspecified stage: Secondary | ICD-10-CM | POA: Insufficient documentation

## 2016-06-08 DIAGNOSIS — Z7901 Long term (current) use of anticoagulants: Secondary | ICD-10-CM

## 2016-06-08 LAB — CBC
HCT: 23.3 % — ABNORMAL LOW (ref 39.0–52.0)
Hemoglobin: 7.7 g/dL — ABNORMAL LOW (ref 13.0–17.0)
MCH: 30.2 pg (ref 26.0–34.0)
MCHC: 33 g/dL (ref 30.0–36.0)
MCV: 91.4 fL (ref 78.0–100.0)
PLATELETS: 141 10*3/uL — AB (ref 150–400)
RBC: 2.55 MIL/uL — ABNORMAL LOW (ref 4.22–5.81)
RDW: 15.4 % (ref 11.5–15.5)
WBC: 8.9 10*3/uL (ref 4.0–10.5)

## 2016-06-08 LAB — PREPARE RBC (CROSSMATCH)

## 2016-06-08 LAB — PROTIME-INR
INR: 3.93
PROTHROMBIN TIME: 39.4 s — AB (ref 11.4–15.2)

## 2016-06-08 LAB — HEMOGLOBIN AND HEMATOCRIT, BLOOD
HCT: 23.1 % — ABNORMAL LOW (ref 39.0–52.0)
Hemoglobin: 7.6 g/dL — ABNORMAL LOW (ref 13.0–17.0)

## 2016-06-08 MED ORDER — SODIUM CHLORIDE 0.9 % IV SOLN
Freq: Once | INTRAVENOUS | Status: AC
  Administered 2016-06-08: 10 mL/h via INTRAVENOUS

## 2016-06-08 MED ORDER — ALBUTEROL SULFATE (2.5 MG/3ML) 0.083% IN NEBU: 2.5000 mg | INHALATION_SOLUTION | Freq: Four times a day (QID) | RESPIRATORY_TRACT | Status: DC | PRN

## 2016-06-08 NOTE — Consult Note (Addendum)
     [facility] CM Primary Care Navigator  06/08/2016  Rafat Poague 1918/08/19 [id number]   Went in to patient's room to identify possible discharge needs but patient is unable to answer questions appropriately. No family member present in the room.  Nursing staff in the room reports that patient is disoriented.  Electronic medical record reveals that patient has previously been living at [place] (ALF). He also has previously been at [facility] where his care needs had been provided. Primary care provider is Dr. Lawerance Cruel with [facility].  Per inpatient SW note, his daughter is agreeable to send patient to skilled nursing facility at this time due to patient's physical limitations and daughter prefers [employer] [facility] when ready.  Will notify primary care provider's office of patient's possible discharge to skilled nursing facility to follow-up post SNF  discharge and transition of care.  For additional questions please contact:  Edwena Felty A. Kenya Shiraishi, BSN, RN-BC [facility] PRIMARY CARE Navigator Cell: [phone]

## 2016-06-08 NOTE — NC FL2 (Signed)
[place] MEDICAID FL2 LEVEL OF CARE SCREENING TOOL     IDENTIFICATION  Patient Name: Kurt Thomas Birthdate: 1918/09/12 Sex: male Admission Date (Current Location): 06/04/2016  [facility] and [place] Number:  Herbalist and Address:  [facility], [place] [address], [place], [place] [address]      Provider Number: [id number]  Attending Physician Name and Address:  Eber Jones, MD  Relative Name and Phone Number:       Current Level of Care: Hospital Recommended Level of Care: [place] Prior Approval Number:    Date Approved/Denied: 06/24/14 PASRR Number: [id number]  Discharge Plan: SNF    Current Diagnoses: Patient Active Problem List   Diagnosis Date Noted  . Chronic anticoagulation 06/05/2016  . BPH (benign prostatic hyperplasia) 06/05/2016  . Fractured hip, right, closed, initial encounter ([place]) 06/04/2016  . Protein-calorie malnutrition, severe ([place]) 09/05/2014  . PVC's (premature ventricular contractions)   . CKD (chronic kidney disease) stage 3, GFR 30-59 ml/min 09/03/2014  . Acute encephalopathy 09/03/2014  . Vomiting 09/03/2014  . Difficulty urinating 09/03/2014  . CAD (coronary artery disease)   . Essential hypertension 06/22/2014  . Atrial fibrillation ([place]) 06/21/2014  . Hip fracture ([place]) 06/20/2014  . Renal insufficiency 06/20/2014  . Closed left hip fracture ([place])   . Lumbar stenosis 05/07/2014  . Low back pain 03/31/2014  . Gait difficulty 03/31/2014  . High blood pressure     Orientation RESPIRATION BLADDER Height & Weight     Self  O2 (Nasal Cannual 3L) Incontinent Weight:   Height:     BEHAVIORAL SYMPTOMS/MOOD NEUROLOGICAL BOWEL NUTRITION STATUS      Continent  (Please check d/c summary)  AMBULATORY STATUS COMMUNICATION OF NEEDS Skin   Extensive Assist Verbally Surgical wounds (Closed right hip; Hydrocolloid dressing)                       Personal Care Assistance Level of  Assistance  Bathing, Feeding, Dressing Bathing Assistance: Maximum assistance Feeding assistance: Independent Dressing Assistance: Maximum assistance     Functional Limitations Info  Sight, Hearing, Speech Sight Info: Adequate Hearing Info: Adequate Speech Info: Adequate    SPECIAL CARE FACTORS FREQUENCY  PT (By licensed PT), OT (By licensed OT)     PT Frequency: 5x week OT Frequency: 5x week            Contractures Contractures Info: Not present    Additional Factors Info  Code Status, Allergies, Isolation Precautions Code Status Info: Full Allergies Info: No known allergies     Isolation Precautions Info: Contact precaution: MRSA     Current Medications (06/08/2016):  This is the current hospital active medication list Current Facility-Administered Medications  Medication Dose Route Frequency Provider Last Rate Last Dose  . 0.9 %  sodium chloride infusion   Intravenous Continuous Mcarthur Rossetti, MD 50 mL/hr at 06/06/16 2116    . acetaminophen (TYLENOL) tablet 650 mg  650 mg Oral Q6H PRN Mcarthur Rossetti, MD   650 mg at 06/08/16 1128   Or  . acetaminophen (TYLENOL) suppository 650 mg  650 mg Rectal Q6H PRN Mcarthur Rossetti, MD      . albuterol (PROVENTIL) (2.5 MG/3ML) 0.083% nebulizer solution 2.5 mg  2.5 mg Nebulization Q6H PRN Norval Morton, MD      . amLODipine (NORVASC) tablet 10 mg  10 mg Oral Daily Norval Morton, MD   10 mg at 06/08/16 1129  .  brimonidine (ALPHAGAN) 0.2 % ophthalmic solution 1 drop  1 drop Both Eyes BID Norval Morton, MD   1 drop at 06/08/16 1129  . carvedilol (COREG) tablet 12.5 mg  12.5 mg Oral BID WC Rondell Charmayne Sheer, MD   12.5 mg at 06/08/16 1128  . Chlorhexidine Gluconate Cloth 2 % PADS 6 each  6 each Topical Q0600 Tyrone Apple, [place]   6 each at 06/07/16 0641  . diphenhydrAMINE (BENADRYL) 12.5 MG/5ML elixir 12.5 mg  12.5 mg Oral TID PRN Norval Morton, MD      . donepezil (ARICEPT) tablet 10 mg  10 mg Oral QHS Norval Morton, MD   10 mg at 06/07/16 2203  . DULoxetine (CYMBALTA) DR capsule 30 mg  30 mg Oral Daily Norval Morton, MD   30 mg at 06/08/16 1128  . famotidine (PEPCID) tablet 20 mg  20 mg Oral Daily Norval Morton, MD   20 mg at 06/08/16 1129  . feeding supplement (ENSURE ENLIVE) (ENSURE ENLIVE) liquid 237 mL  237 mL Oral TID BM Norval Morton, MD   237 mL at 06/08/16 1132  . fluticasone (FLONASE) 50 MCG/ACT nasal spray 2 spray  2 spray Each Nare QHS Norval Morton, MD   2 spray at 06/07/16 2159  . HYDROcodone-acetaminophen (NORCO/VICODIN) 5-325 MG per tablet 1-2 tablet  1-2 tablet Oral Q6H PRN Mcarthur Rossetti, MD   1 tablet at 06/07/16 2209  . isosorbide-hydrALAZINE (BIDIL) 20-37.5 MG per tablet 1 tablet  1 tablet Oral BID Norval Morton, MD   1 tablet at 06/08/16 1128  . latanoprost (XALATAN) 0.005 % ophthalmic solution 1 drop  1 drop Both Eyes QHS Norval Morton, MD   1 drop at 06/07/16 2200  . MEDLINE mouth rinse  15 mL Mouth Rinse BID Theodis Blaze, MD   15 mL at 06/07/16 2211  . menthol-cetylpyridinium (CEPACOL) lozenge 3 mg  1 lozenge Oral PRN Mcarthur Rossetti, MD       Or  . phenol (CHLORASEPTIC) mouth spray 1 spray  1 spray Mouth/Throat PRN Mcarthur Rossetti, MD      . metoCLOPramide (REGLAN) tablet 5-10 mg  5-10 mg Oral Q8H PRN Mcarthur Rossetti, MD       Or  . metoCLOPramide (REGLAN) injection 5-10 mg  5-10 mg Intravenous Q8H PRN Mcarthur Rossetti, MD      . mirabegron ER [facility]) tablet 25 mg  25 mg Oral Daily Norval Morton, MD   25 mg at 06/08/16 1127  . morphine 2 MG/ML injection 0.5 mg  0.5 mg Intravenous Q2H PRN Mcarthur Rossetti, MD      . mupirocin ointment (BACTROBAN) 2 % 1 application  1 application Nasal BID Tyrone Apple, [facility]   1 application at [id number] 1130  . ondansetron (ZOFRAN) tablet 4 mg  4 mg Oral Q6H PRN Mcarthur Rossetti, MD       Or  . ondansetron [facility]) injection 4 mg  4 mg Intravenous Q6H PRN Mcarthur Rossetti, MD       . polyethylene glycol (MIRALAX / GLYCOLAX) packet 17 g  17 g Oral BID Norval Morton, MD   17 g at 06/08/16 1127  . tamsulosin (FLOMAX) capsule 0.4 mg  0.4 mg Oral QPM Norval Morton, MD   0.4 mg at 06/07/16 1921  . timolol (TIMOPTIC) 0.5 % ophthalmic solution 1 drop  1 drop Both Eyes BID Rondell A Smith,  MD   1 drop at 06/08/16 1130  . Warfarin - Pharmacist Dosing Inpatient   Does not apply [id number] Theodis Blaze, MD   Stopped at 06/06/16 1800     Discharge Medications: Please see discharge summary for a list of discharge medications.  Relevant Imaging Results:  Relevant Lab Results:   Additional Information SSN: [id number]  Alla German, LCSW

## 2016-06-08 NOTE — Progress Notes (Signed)
PROGRESS NOTE    Kurt Thomas  [id number] DOB: June 03, 1919 DOA: 06/04/2016 PCP: Melinda Crutch, MD   Brief Narrative:  82 y.o.malewith medical history significant of HTN, CVA, afib on chronic afib, and Alz dementia; who presented for complaints of right hip pain after fall.   ED Course:Upon admission to the emergency department patient was evaluated and seen to be afebrile with vitals relatively within normal limits except for some O2 sats in the upper 80s after pain medications given. Lab work revealed INR of 2.24 and otherwise appeared near his baseline. Dr. Ninfa Linden of orthopedics evaluated the patient and recommended giving the patient vitamin K, plan for surgery 11/27.  Patient underwent intramedullary nail placement on 11/27.   Assessment & Plan:   Principal Problem:   Fractured hip, right, closed, initial encounter [facility]) Active Problems:   Atrial fibrillation ([place])   Essential hypertension   CKD (chronic kidney disease) stage 3, GFR 30-59 ml/min   Chronic anticoagulation   BPH (benign prostatic hyperplasia)   Right intertrochanteric femur/hip fracture - pt is s/p INTRAMEDULLARY (IM) NAIL FEMORAL (Right), post op day #2 - appreciate ortho team following  - allow analgesia as needed  - PT eval done, SNF recommended, SW consulted for assistance - resumed Coumadin however may need to stop given low H/H  Atrial fibrillation on chronic anticoagulation therapy - Heart rates well controlled at this time. Initial INR noted to be 2.25 - coumadin per pharmacy  - INR of 3.9 today  Essential hypertension - Continue Coreg, BiDil, and amlodipine  - pt also on Hydralazine but will stop that as SBP is in low 100's - continue to monitor   Chronic kidney disease stage III - Continue to monitor as Cr is slightly up this AM - encouraged PO intake  - BMP In AM  Anemia of chronic disease - no signs of active bleeding  - CBC in AM - s/p 1 unit transfusion yesterday - H/H stable  this am from yesterday - will repeat H/H and transfuse additional unit - only 86mL of blood loss noted on op report - INR of 3.8 today - FOBT ordered  Dementia - Continue Aricept  Glaucoma - Continue latanpoprost, Combigan  BPH - Continue Flomax  GERD - continue PPI  Underweight, BMI < 17 - nutritionist consulted   DVT prophylaxis: SCD's, Warfarin  Code Status: Full  Family Communication: Patient at bedside  Disposition Plan: SNF in 1-2 days if Hg and Cr stable    Consultants:   Ortho  Procedures:   Right Hip Repair 11/27  Antimicrobials:   None    Subjective: Patient seen and evaluated.  Received 1 unit PRBC last evening but H/H did not increase on am draw.  Will need repeat transfusion.  Per nursing staff no melena or hematochezia noted.  No chest pain or chest pressure.  Patient has been drinking Ensure without difficulty.  Nursing staff asking for breathing treatments given some slight wheezing noted.  Objective: Vitals:   06/07/16 1915 06/07/16 2145 06/08/16 0706 06/08/16 1300  BP: 126/77 (!) 130/57 114/73 (!) 89/44  Pulse: 73 69 71 72  Resp: 16 16 16 16   Temp: 98.2 F (36.8 C) 98.4 F (36.9 C) 97.8 F (36.6 C) 98.4 F (36.9 C)  TempSrc: Oral Oral Axillary Oral  SpO2: 100% 99% 98% 95%    Intake/Output Summary (Last 24 hours) at 06/08/16 1644 Last data filed at 06/08/16 1300  Gross per 24 hour  Intake  1220 ml  Output              550 ml  Net              670 ml   There were no vitals filed for this visit.  Examination:  General exam: Appears calm and comfortable, thin frail looking elderly male Respiratory system: Upper airway noises appreciated Respiratory effort normal. Cardiovascular system: S1 & S2 heard, RRR. No JVD, murmurs, rubs, gallops or clicks. No pedal edema. Gastrointestinal system: Abdomen is nondistended, soft and nontender. No organomegaly or masses felt. Normal bowel sounds heard. Central nervous system:  Alert and oriented to self. No focal neurological deficits. Extremities: Symmetric 5 x 5 power in upper extremities bilaterally.  Able to move lower extremities bilaterally Skin: No rashes, lesions or ulcers Psychiatry: Judgement and insight appear normal. Mood & affect appropriate.     Data Reviewed: I have personally reviewed following labs and imaging studies  CBC:  Recent Labs Lab 06/04/16 1748 06/05/16 0547 06/06/16 0458 06/07/16 0438 06/08/16 0323 06/08/16 1512  WBC 5.9 5.1 10.9* 9.8 8.9  --   NEUTROABS 4.4 3.6  --   --   --   --   HGB 9.4* 8.8* 8.5* 7.6* 7.7* 7.6*  HCT 28.7* 26.9* 26.7* 23.0* 23.3* 23.1*  MCV 90.8 92.4 93.0 91.3 91.4  --   PLT 197 178 162 142* 141*  --    Basic Metabolic Panel:  Recent Labs Lab 06/04/16 1748 06/05/16 0547 06/06/16 0458 06/07/16 0438  NA 137 138 139 138  K 3.5 3.6 4.3 4.0  CL 104 105 105 105  CO2 24 24 27 25   GLUCOSE 124* 119* 116* 110*  BUN 18 19 19  25*  CREATININE 1.67* 1.58* 1.67* 1.78*  CALCIUM 9.2 9.0 8.8* 8.7*   GFR: CrCl cannot be calculated (Unknown ideal weight.). Liver Function Tests: No results for input(s): AST, ALT, ALKPHOS, BILITOT, PROT, ALBUMIN in the last 168 hours. No results for input(s): LIPASE, AMYLASE in the last 168 hours. No results for input(s): AMMONIA in the last 168 hours. Coagulation Profile:  Recent Labs Lab 06/04/16 1748 06/05/16 0547 06/06/16 1128 06/07/16 0438 06/08/16 0815  INR 2.09 1.77 2.52 3.83 3.93   Cardiac Enzymes: No results for input(s): CKTOTAL, CKMB, CKMBINDEX, TROPONINI in the last 168 hours. BNP (last 3 results) No results for input(s): PROBNP in the last 8760 hours. HbA1C: No results for input(s): HGBA1C in the last 72 hours. CBG: No results for input(s): GLUCAP in the last 168 hours. Lipid Profile: No results for input(s): CHOL, HDL, LDLCALC, TRIG, CHOLHDL, LDLDIRECT in the last 72 hours. Thyroid Function Tests: No results for input(s): TSH, T4TOTAL, FREET4,  T3FREE, THYROIDAB in the last 72 hours. Anemia Panel: No results for input(s): VITAMINB12, FOLATE, FERRITIN, TIBC, IRON, RETICCTPCT in the last 72 hours. Sepsis Labs: No results for input(s): PROCALCITON, LATICACIDVEN in the last 168 hours.  Recent Results (from the past 240 hour(s))  MRSA PCR Screening     Status: Abnormal   Collection Time: 06/05/16  5:56 AM  Result Value Ref Range Status   MRSA by PCR POSITIVE (A) NEGATIVE Final    Comment:        The GeneXpert MRSA Assay (FDA approved for NASAL specimens only), is one component of a comprehensive MRSA colonization surveillance program. It is not intended to diagnose MRSA infection nor to guide or monitor treatment for MRSA infections. RESULT CALLED TO, READ BACK BY AND VERIFIED WITH: G ADAMS,RN AT  [id number] 06/05/16 BY L BENFIELD          Radiology Studies: No results found.      Scheduled Meds: . sodium chloride   Intravenous Once  . amLODipine  10 mg Oral Daily  . brimonidine  1 drop Both Eyes BID  . carvedilol  12.5 mg Oral BID WC  . Chlorhexidine Gluconate Cloth  6 each Topical Q0600  . donepezil  10 mg Oral QHS  . DULoxetine  30 mg Oral Daily  . famotidine  20 mg Oral Daily  . feeding supplement (ENSURE ENLIVE)  237 mL Oral TID BM  . fluticasone  2 spray Each Nare QHS  . isosorbide-hydrALAZINE  1 tablet Oral BID  . latanoprost  1 drop Both Eyes QHS  . mouth rinse  15 mL Mouth Rinse BID  . mirabegron ER  25 mg Oral Daily  . mupirocin ointment  1 application Nasal BID  . polyethylene glycol  17 g Oral BID  . tamsulosin  0.4 mg Oral QPM  . timolol  1 drop Both Eyes BID  . Warfarin - Pharmacist Dosing Inpatient   Does not apply q1800   Continuous Infusions: . sodium chloride 50 mL/hr at 06/06/16 2116     LOS: 4 days    Time spent: 35 minutes    Loretha Stapler, MD [facility] Pager [phone]  If 7PM-7AM, please contact night-coverage [email or web address] Password [email or web address] 06/08/2016, 4:44 PM

## 2016-06-08 NOTE — Progress Notes (Signed)
Physical Therapy Treatment Patient Details Name: Kurt Thomas MRN: [id number] DOB: March 27, 1919 Today's Date: 06/08/2016    History of Present Illness Kurt Thomas a 82 y.o.malewith medical history significant of HTN, CVA, afib on chronic afib, and Alz dementia; who presents for complaints of right hip pain after fall. Hehad been trying to sit down in his recliner When he missed the chair falling ontohis right side on the floor. Pt s/p IM nail of R hip on 11/27. Patient had similar incident happened when he fell and fractured his left hip 2 years ago, but recovered very quickly.    PT Comments    Patient required max A +2 for all mobility this session. Continue to progress as tolerated with anticipated d/c to SNF for further skilled PT services.    Follow Up Recommendations  SNF;Supervision/Assistance - 24 hour     Equipment Recommendations  None recommended by PT    Recommendations for Other Services       Precautions / Restrictions Precautions Precautions: Fall Precaution Comments: dementia Restrictions Weight Bearing Restrictions: Yes RLE Weight Bearing: Weight bearing as tolerated    Mobility  Bed Mobility Overal bed mobility: Needs Assistance;+2 for physical assistance Bed Mobility: Supine to Sit     Supine to sit: Max assist;+2 for physical assistance     General bed mobility comments: assist for all aspects of bed mobility  Transfers Overall transfer level: Needs assistance Equipment used: 2 person hand held assist Transfers: Stand Pivot Transfers   Stand pivot transfers: Max assist;+2 physical assistance       General transfer comment: max multimodal cues for hand placment; pt tends to grip onto objects due to fear/feeling of falling when sitting EOB; pt with heavy L lateral lean likely to avoid pain from R hip and required max A to maintain upright sitting; +2 to pivot to recliner  Ambulation/Gait             General Gait Details: unable at this  time   Stairs            Wheelchair Mobility    Modified Rankin (Stroke Patients Only)       Balance     Sitting balance-Leahy Scale: Poor Sitting balance - Comments: heavy L lateral lean     Standing balance-Leahy Scale: Poor                      Cognition Arousal/Alertness: Awake/alert Behavior During Therapy: Flat affect Overall Cognitive Status: History of cognitive impairments - at baseline       Memory: Decreased short-term memory              Exercises      General Comments        Pertinent Vitals/Pain Pain Assessment: Faces Faces Pain Scale: Hurts even more (with transitional movements) Pain Location: R hip Pain Descriptors / Indicators: Grimacing;Guarding;Moaning;Sore Pain Intervention(s): Limited activity within patient's tolerance;Monitored during session;Premedicated before session;Repositioned    Home Living                      Prior Function            PT Goals (current goals can now be found in the care plan section) Acute Rehab PT Goals Patient Stated Goal: back to stop hurting PT Goal Formulation: Patient unable to participate in goal setting Time For Goal Achievement: 06/13/16 Potential to Achieve Goals: Good Progress towards PT goals: Progressing toward goals    Frequency  Min 5X/week      PT Plan Current plan remains appropriate    Co-evaluation             End of Session Equipment Utilized During Treatment: Gait belt Activity Tolerance: Patient limited by pain;Other (comment) (?sundowning) Patient left: in chair;with call bell/phone within reach;with chair alarm set     Time: [id number] PT Time Calculation (min) (ACUTE ONLY): 19 min  Charges:  $Therapeutic Activity: 8-22 mins                    G Codes:      Salina April, PTA Pager: [phone]   06/08/2016, 4:18 PM

## 2016-06-08 NOTE — Progress Notes (Signed)
ANTICOAGULATION CONSULT NOTE - Follow Up Consult  Pharmacy Consult:  Coumadin Indication: atrial fibrillation  No Known Allergies   Vital Signs: Temp: 97.8 F (36.6 C) (11/30 0706) Temp Source: Axillary (11/30 0706) BP: 114/73 (11/30 0706) Pulse Rate: 71 (11/30 0706)  Labs:  Recent Labs  06/06/16 0458 06/06/16 1128 06/07/16 0438 06/08/16 0323 06/08/16 0815  HGB 8.5*  --  7.6* 7.7*  --   HCT 26.7*  --  23.0* 23.3*  --   PLT 162  --  142* 141*  --   LABPROT  --  27.7* 38.7*  --  39.4*  INR  --  2.52 3.83  --  3.93  CREATININE 1.67*  --  1.78*  --   --     CrCl cannot be calculated (Unknown ideal weight.).    Assessment: 89 YOM on Coumadin PTA for history of AFib.  Patient's INR was reversed for IM nail on 06/05/16 with 5mg  of IV Vitamin K.  Coumadin 5mg  PO was given 11/28 early AM.  No further Coumadin was given since and INR increased further to 3.93 today.  Patient's hemoglobin and platelet counts have decreased, but no bleeding reported.   Goal of Therapy:  INR 2-3    Plan:  - Hold Coumadin again today - Daily PT / INR   Quantina Dershem D. Mina Marble, PharmD, BCPS Pager:  [phone] 06/08/2016, 10:19 AM

## 2016-06-08 NOTE — Clinical Social Work Note (Signed)
Clinical Social Work Assessment  Patient Details  Name: Kurt Thomas MRN: [id number] Date of Birth: 12-31-1918  Date of referral:  06/08/16               Reason for consult:  Facility Placement                Permission sought to share information with:  Family Supports Permission granted to share information::    Name::      Debbie  Agency::     Relationship::   Daughter  Contact Information:    [phone]  Housing/Transportation Living arrangements for the past 2 months:  [facility]) Source of Information:  Adult Children Patient Interpreter Needed:  None Criminal Activity/Legal Involvement Pertinent to Current Situation/Hospitalization:  No - Comment as needed Significant Relationships:  Adult Children Lives with:  Self Do you feel safe going back to the place where you live?  Yes Need for family participation in patient care:  Yes (Comment)  Care giving concerns:  Pt is disoriented. CSW spoke with pt's daughter via telephone. Pt's reports that they request the pt be sent to [facility]. Pt's daughter denies any question or concerns at this time.   Social Worker assessment / plan:  Pt is disoriented. CSW spoke with pt's daughter via telephone. Pt's daughter reports pt has previously been living at [facility]. Pt's daughter also reported that pt has previously been at [facility] and the family was satisfied with care. Pt's daughter is agreeable to send pt to SNF and prefers [facility]. CSW will facilitate placement and reach out to daughter if/when a bed offer is made.  Employment status:  Retired Forensic scientist:  Medicare PT Recommendations:  [place] / Referral to community resources:  [place]  Patient/Family's Response to care:  Pt's daughter verbalized understanding of CSW role and appreciation of support. Pt's daughter denies any concerns regarding pt's care at this  time.  Patient/Family's Understanding of and Emotional Response to Diagnosis, Current Treatment, and Prognosis:  Pt's daughter understanding and realistic concerning pt's physical limitations. Pt's daughter familiar with SNF placement and agreeable at this time. Pt's daughter denies any concerns or questions regarding pt's treatment plan.  Emotional Assessment Appearance:  Appears stated age Attitude/Demeanor/Rapport:  Unable to Assess Affect (typically observed):  Unable to Assess Orientation:  Oriented to Self (Disoriented) Alcohol / Substance use:  Not Applicable Psych involvement (Current and /or in the community):  No (Comment)  Discharge Needs  Concerns to be addressed:  No discharge needs identified Readmission within the last 30 days:  No Current discharge risk:  Dependent with Mobility Barriers to Discharge:  Continued Medical Work up   [employer], LCSW 06/08/2016, 10:31 AM

## 2016-06-08 NOTE — Clinical Social Work Placement (Addendum)
   CLINICAL SOCIAL WORK PLACEMENT  NOTE  Date:  06/08/2016  Patient Details  Name: Kurt Thomas MRN: [id number] Date of Birth: 30-Nov-1918  Clinical Social Work is seeking post-discharge placement for this patient at the [place] level of care (*CSW will initial, date and re-position this form in  chart as items are completed):  Yes   Patient/family provided with [place] Work Department's list of facilities offering this level of care within the geographic area requested by the patient (or if unable, by the patient's family).      Patient/family informed of their freedom to choose among providers that offer the needed level of care, that participate in Medicare, Medicaid or managed care program needed by the patient, have an available bed and are willing to accept the patient.      Patient/family informed of [facility]'s ownership interest in [facility] and [facility], as well as of the fact that they are under no obligation to receive care at these facilities.  PASRR submitted to EDS on       PASRR number received on       Existing PASRR number confirmed on 06/08/16     FL2 transmitted to all facilities in geographic area requested by pt/family on 06/08/16     FL2 transmitted to all facilities within larger geographic area on       Patient informed that his/her managed care company has contracts with or will negotiate with certain facilities, including the following:        Yes   Patient/family informed of bed offers received.  Patient chooses bed at [facility]     Physician recommends and patient chooses bed at      Patient to be transferred to [facility] on 06/09/16.  Patient to be transferred to facility by [facility]     Patient family notified on 06/09/16 of transfer.  Name of family member notified:  Debbie.    PHYSICIAN Please prepare prescriptions, Please prepare priority discharge summary, including medications, Please  sign FL2     Additional Comment:    _______________________________________________ Alla German, LCSW 06/08/2016, 1:55 PM

## 2016-06-09 ENCOUNTER — Inpatient Hospital Stay (HOSPITAL_COMMUNITY): Payer: Medicare Other

## 2016-06-09 DIAGNOSIS — S72144D Nondisplaced intertrochanteric fracture of right femur, subsequent encounter for closed fracture with routine healing: Secondary | ICD-10-CM | POA: Diagnosis present

## 2016-06-09 DIAGNOSIS — S72141S Displaced intertrochanteric fracture of right femur, sequela: Secondary | ICD-10-CM | POA: Diagnosis not present

## 2016-06-09 DIAGNOSIS — I482 Chronic atrial fibrillation: Secondary | ICD-10-CM | POA: Diagnosis not present

## 2016-06-09 DIAGNOSIS — R41841 Cognitive communication deficit: Secondary | ICD-10-CM | POA: Diagnosis not present

## 2016-06-09 DIAGNOSIS — F329 Major depressive disorder, single episode, unspecified: Secondary | ICD-10-CM | POA: Diagnosis not present

## 2016-06-09 DIAGNOSIS — L89152 Pressure ulcer of sacral region, stage 2: Secondary | ICD-10-CM | POA: Diagnosis present

## 2016-06-09 DIAGNOSIS — I10 Essential (primary) hypertension: Secondary | ICD-10-CM

## 2016-06-09 DIAGNOSIS — R339 Retention of urine, unspecified: Secondary | ICD-10-CM | POA: Diagnosis not present

## 2016-06-09 DIAGNOSIS — S72001S Fracture of unspecified part of neck of right femur, sequela: Secondary | ICD-10-CM | POA: Diagnosis not present

## 2016-06-09 DIAGNOSIS — N3281 Overactive bladder: Secondary | ICD-10-CM | POA: Diagnosis not present

## 2016-06-09 DIAGNOSIS — E860 Dehydration: Secondary | ICD-10-CM | POA: Diagnosis present

## 2016-06-09 DIAGNOSIS — R791 Abnormal coagulation profile: Secondary | ICD-10-CM | POA: Diagnosis present

## 2016-06-09 DIAGNOSIS — S72001A Fracture of unspecified part of neck of right femur, initial encounter for closed fracture: Secondary | ICD-10-CM | POA: Diagnosis not present

## 2016-06-09 DIAGNOSIS — L89150 Pressure ulcer of sacral region, unstageable: Secondary | ICD-10-CM | POA: Diagnosis not present

## 2016-06-09 DIAGNOSIS — G309 Alzheimer's disease, unspecified: Secondary | ICD-10-CM | POA: Diagnosis not present

## 2016-06-09 DIAGNOSIS — I959 Hypotension, unspecified: Secondary | ICD-10-CM | POA: Diagnosis present

## 2016-06-09 DIAGNOSIS — S72144S Nondisplaced intertrochanteric fracture of right femur, sequela: Secondary | ICD-10-CM | POA: Diagnosis present

## 2016-06-09 DIAGNOSIS — G301 Alzheimer's disease with late onset: Secondary | ICD-10-CM | POA: Diagnosis not present

## 2016-06-09 DIAGNOSIS — M80051D Age-related osteoporosis with current pathological fracture, right femur, subsequent encounter for fracture with routine healing: Secondary | ICD-10-CM | POA: Diagnosis not present

## 2016-06-09 DIAGNOSIS — R634 Abnormal weight loss: Secondary | ICD-10-CM | POA: Diagnosis not present

## 2016-06-09 DIAGNOSIS — R1319 Other dysphagia: Secondary | ICD-10-CM | POA: Diagnosis not present

## 2016-06-09 DIAGNOSIS — I4891 Unspecified atrial fibrillation: Secondary | ICD-10-CM | POA: Diagnosis not present

## 2016-06-09 DIAGNOSIS — Z96642 Presence of left artificial hip joint: Secondary | ICD-10-CM | POA: Diagnosis present

## 2016-06-09 DIAGNOSIS — R402411 Glasgow coma scale score 13-15, in the field [EMT or ambulance]: Secondary | ICD-10-CM | POA: Diagnosis not present

## 2016-06-09 DIAGNOSIS — K5900 Constipation, unspecified: Secondary | ICD-10-CM | POA: Diagnosis not present

## 2016-06-09 DIAGNOSIS — R1310 Dysphagia, unspecified: Secondary | ICD-10-CM | POA: Diagnosis not present

## 2016-06-09 DIAGNOSIS — J189 Pneumonia, unspecified organism: Secondary | ICD-10-CM | POA: Diagnosis not present

## 2016-06-09 DIAGNOSIS — H409 Unspecified glaucoma: Secondary | ICD-10-CM | POA: Diagnosis not present

## 2016-06-09 DIAGNOSIS — R262 Difficulty in walking, not elsewhere classified: Secondary | ICD-10-CM | POA: Diagnosis not present

## 2016-06-09 DIAGNOSIS — D62 Acute posthemorrhagic anemia: Secondary | ICD-10-CM | POA: Diagnosis not present

## 2016-06-09 DIAGNOSIS — I129 Hypertensive chronic kidney disease with stage 1 through stage 4 chronic kidney disease, or unspecified chronic kidney disease: Secondary | ICD-10-CM | POA: Diagnosis present

## 2016-06-09 DIAGNOSIS — M25551 Pain in right hip: Secondary | ICD-10-CM | POA: Diagnosis not present

## 2016-06-09 DIAGNOSIS — R197 Diarrhea, unspecified: Secondary | ICD-10-CM | POA: Diagnosis not present

## 2016-06-09 DIAGNOSIS — S72009A Fracture of unspecified part of neck of unspecified femur, initial encounter for closed fracture: Secondary | ICD-10-CM | POA: Diagnosis not present

## 2016-06-09 DIAGNOSIS — I2510 Atherosclerotic heart disease of native coronary artery without angina pectoris: Secondary | ICD-10-CM | POA: Diagnosis present

## 2016-06-09 DIAGNOSIS — M6281 Muscle weakness (generalized): Secondary | ICD-10-CM | POA: Diagnosis not present

## 2016-06-09 DIAGNOSIS — Z87891 Personal history of nicotine dependence: Secondary | ICD-10-CM | POA: Diagnosis not present

## 2016-06-09 DIAGNOSIS — Z7901 Long term (current) use of anticoagulants: Secondary | ICD-10-CM | POA: Diagnosis not present

## 2016-06-09 DIAGNOSIS — Y95 Nosocomial condition: Secondary | ICD-10-CM | POA: Diagnosis present

## 2016-06-09 DIAGNOSIS — I481 Persistent atrial fibrillation: Secondary | ICD-10-CM | POA: Diagnosis not present

## 2016-06-09 DIAGNOSIS — F339 Major depressive disorder, recurrent, unspecified: Secondary | ICD-10-CM | POA: Diagnosis present

## 2016-06-09 DIAGNOSIS — R531 Weakness: Secondary | ICD-10-CM | POA: Diagnosis not present

## 2016-06-09 DIAGNOSIS — N179 Acute kidney failure, unspecified: Secondary | ICD-10-CM | POA: Diagnosis not present

## 2016-06-09 DIAGNOSIS — Z5189 Encounter for other specified aftercare: Secondary | ICD-10-CM | POA: Diagnosis not present

## 2016-06-09 DIAGNOSIS — J449 Chronic obstructive pulmonary disease, unspecified: Secondary | ICD-10-CM | POA: Diagnosis not present

## 2016-06-09 DIAGNOSIS — N400 Enlarged prostate without lower urinary tract symptoms: Secondary | ICD-10-CM | POA: Diagnosis not present

## 2016-06-09 DIAGNOSIS — E43 Unspecified severe protein-calorie malnutrition: Secondary | ICD-10-CM | POA: Diagnosis not present

## 2016-06-09 DIAGNOSIS — M25559 Pain in unspecified hip: Secondary | ICD-10-CM | POA: Diagnosis not present

## 2016-06-09 DIAGNOSIS — R1312 Dysphagia, oropharyngeal phase: Secondary | ICD-10-CM | POA: Diagnosis not present

## 2016-06-09 DIAGNOSIS — Z951 Presence of aortocoronary bypass graft: Secondary | ICD-10-CM | POA: Diagnosis not present

## 2016-06-09 DIAGNOSIS — M79604 Pain in right leg: Secondary | ICD-10-CM | POA: Diagnosis not present

## 2016-06-09 DIAGNOSIS — N183 Chronic kidney disease, stage 3 (moderate): Secondary | ICD-10-CM | POA: Diagnosis not present

## 2016-06-09 DIAGNOSIS — R05 Cough: Secondary | ICD-10-CM | POA: Diagnosis not present

## 2016-06-09 DIAGNOSIS — Z79899 Other long term (current) drug therapy: Secondary | ICD-10-CM | POA: Diagnosis not present

## 2016-06-09 DIAGNOSIS — R2681 Unsteadiness on feet: Secondary | ICD-10-CM | POA: Diagnosis not present

## 2016-06-09 DIAGNOSIS — R7889 Finding of other specified substances, not normally found in blood: Secondary | ICD-10-CM | POA: Diagnosis not present

## 2016-06-09 DIAGNOSIS — Z4789 Encounter for other orthopedic aftercare: Secondary | ICD-10-CM | POA: Diagnosis present

## 2016-06-09 DIAGNOSIS — D649 Anemia, unspecified: Secondary | ICD-10-CM | POA: Diagnosis present

## 2016-06-09 DIAGNOSIS — Z8673 Personal history of transient ischemic attack (TIA), and cerebral infarction without residual deficits: Secondary | ICD-10-CM | POA: Diagnosis not present

## 2016-06-09 DIAGNOSIS — F0280 Dementia in other diseases classified elsewhere without behavioral disturbance: Secondary | ICD-10-CM | POA: Diagnosis not present

## 2016-06-09 DIAGNOSIS — Z8546 Personal history of malignant neoplasm of prostate: Secondary | ICD-10-CM | POA: Diagnosis not present

## 2016-06-09 DIAGNOSIS — K219 Gastro-esophageal reflux disease without esophagitis: Secondary | ICD-10-CM | POA: Diagnosis not present

## 2016-06-09 DIAGNOSIS — R918 Other nonspecific abnormal finding of lung field: Secondary | ICD-10-CM | POA: Diagnosis not present

## 2016-06-09 LAB — BASIC METABOLIC PANEL
ANION GAP: 5 (ref 5–15)
Anion gap: 11 (ref 5–15)
Anion gap: 7 (ref 5–15)
BUN: 46 mg/dL — AB (ref 6–20)
BUN: 42 mg/dL — AB (ref 6–20)
BUN: 42 mg/dL — ABNORMAL HIGH (ref 6–20)
CHLORIDE: 111 mmol/L (ref 101–111)
CHLORIDE: 106 mmol/L (ref 101–111)
CHLORIDE: 105 mmol/L (ref 101–111)
CO2: 23 mmol/L (ref 22–32)
CO2: 29 mmol/L (ref 22–32)
CO2: 27 mmol/L (ref 22–32)
CREATININE: 1.65 mg/dL — AB (ref 0.61–1.24)
CREATININE: 1.54 mg/dL — AB (ref 0.61–1.24)
Calcium: 9.2 mg/dL (ref 8.9–10.3)
Calcium: 9 mg/dL (ref 8.9–10.3)
Calcium: 8.8 mg/dL — ABNORMAL LOW (ref 8.9–10.3)
Creatinine, Ser: 1.57 mg/dL — ABNORMAL HIGH (ref 0.61–1.24)
GFR calc Af Amer: 41 mL/min — ABNORMAL LOW (ref >60)
GFR calc non Af Amer: 34 mL/min — ABNORMAL LOW (ref >60)
GFR calc non Af Amer: 36 mL/min — ABNORMAL LOW (ref >60)
GFR calc non Af Amer: 37 mL/min — ABNORMAL LOW (ref >60)
GFR, EST AFRICAN AMERICAN: 39 mL/min — AB (ref >60)
GFR, EST AFRICAN AMERICAN: 42 mL/min — AB (ref >60)
GLUCOSE: 119 mg/dL — AB (ref 65–99)
Glucose, Bld: 120 mg/dL — ABNORMAL HIGH (ref 65–99)
Glucose, Bld: 137 mg/dL — ABNORMAL HIGH (ref 65–99)
POTASSIUM: 4.7 mmol/L (ref 3.5–5.1)
POTASSIUM: 4.5 mmol/L (ref 3.5–5.1)
POTASSIUM: 4.3 mmol/L (ref 3.5–5.1)
Sodium: 145 mmol/L (ref 135–145)
Sodium: 140 mmol/L (ref 135–145)
Sodium: 139 mmol/L (ref 135–145)

## 2016-06-09 LAB — TYPE AND SCREEN
ABO/RH(D): A POS
Antibody Screen: NEGATIVE
Unit division: 0
Unit division: 0

## 2016-06-09 LAB — PROTIME-INR
INR: 3.74
Prothrombin Time: 37.9 seconds — ABNORMAL HIGH (ref 11.4–15.2)

## 2016-06-09 LAB — CBC
HEMATOCRIT: 27.2 % — AB (ref 39.0–52.0)
HEMOGLOBIN: 8.9 g/dL — AB (ref 13.0–17.0)
MCH: 29.9 pg (ref 26.0–34.0)
MCHC: 32.7 g/dL (ref 30.0–36.0)
MCV: 91.3 fL (ref 78.0–100.0)
PLATELETS: 156 10*3/uL (ref 150–400)
RBC: 2.98 MIL/uL — AB (ref 4.22–5.81)
RDW: 15.7 % — ABNORMAL HIGH (ref 11.5–15.5)
WBC: 7 10*3/uL (ref 4.0–10.5)

## 2016-06-09 MED ORDER — SENNA 8.6 MG PO TABS: 1.0000 | ORAL_TABLET | Freq: Every day | ORAL | 0 refills | Status: DC

## 2016-06-09 MED ORDER — WARFARIN SODIUM 5 MG PO TABS: 2.5000 mg | ORAL_TABLET | Freq: Every day | ORAL | Status: DC

## 2016-06-09 MED ORDER — SENNA 8.6 MG PO TABS
1.0000 | ORAL_TABLET | Freq: Every day | ORAL | Status: DC
  Administered 2016-06-09: 8.6 mg via ORAL
  Filled 2016-06-09: qty 1

## 2016-06-09 NOTE — Progress Notes (Signed)
Attempted to call report x 2 to [facility]. Receiving RN unable to take report at time. Call back number given when RN available. Pt currently resting comfortably in bed. Will continue to monitor

## 2016-06-09 NOTE — Progress Notes (Signed)
ANTICOAGULATION CONSULT NOTE - Follow Up Consult  Pharmacy Consult:  Coumadin Indication: atrial fibrillation  No Known Allergies   Vital Signs: Temp: 97.7 F (36.5 C) (12/01 0526) Temp Source: Axillary (12/01 0526) BP: 130/64 (12/01 0821) Pulse Rate: 73 (12/01 0821)  Labs:  Recent Labs  06/07/16 0438 06/08/16 0323 06/08/16 0815 06/08/16 1512 06/09/16 0503  HGB 7.6* 7.7*  --  7.6* 8.9*  HCT 23.0* 23.3*  --  23.1* 27.2*  PLT 142* 141*  --   --  156  LABPROT 38.7*  --  39.4*  --  37.9*  INR 3.83  --  3.93  --  3.74  CREATININE 1.78*  --   --   --   --     CrCl cannot be calculated (Unknown ideal weight.).    Assessment: 89 YOM on Coumadin PTA for history of AFib.  Patient's INR was reversed for IM nail on 06/05/16 with 5mg  of IV Vitamin K.  Coumadin 5mg  PO was given 11/28 early AM.  No further Coumadin was given since and INR remains supra-therapeutic (trending down).  Patient's hemoglobin and platelet counts have decreased, but no bleeding reported.   Goal of Therapy:  INR 2-3    Plan:  - Hold Coumadin again today - Daily PT / INR   Maron Stanzione D. Mina Marble, PharmD, BCPS Pager:  [phone] 06/09/2016, 8:58 AM

## 2016-06-09 NOTE — Discharge Summary (Signed)
Physician Discharge Summary  Kurt Thomas [id number] DOB: 05-14-1919 DOA: 06/04/2016  PCP: Melinda Crutch, MD  Admit date: 06/04/2016 Discharge date: 06/09/2016  Admitted From: [facility] Disposition:  SNF  Recommendations for Outpatient Follow-up:  1. Follow up with PCP in 1-2 weeks 2. Follow up with Orthopaedics in 2 weeks 3. Recheck INR on Sunday, 06/11/16 4. Resume Coumadin on Sunday 06/11/16 if INR between 2-3 5. Please obtain BMP/CBC in two days 6. Continue bowel regimen   Home Health: No Equipment/Devices: None   Discharge Condition: Stable CODE STATUS: Full code  Diet recommendation: Heart Healthy  Brief/Interim Summary: 82 y.o.malewith medical history significant of HTN, CVA, afib on chronic afib, and Alz dementia; who presented for complaints of right hip pain after fall. Upon admission to the emergency department patient was evaluated and seen to be afebrile with vitals relatively within normal limits except for some O2 sats in the upper 80s after pain medications given. Lab work revealed INR of 2.24 and otherwise appeared near his baseline. Dr. Ninfa Linden of orthopedics evaluated the patient and recommended giving the patient vitamin K, plan for surgery 11/27.  Patient tolerated surgery well and post operatively had a slightly decreased H/H and received 2 units PRBC.  H/H was stable on 12/1.  Will need repeat CBC and BMP (to check H/H and kidney function) as well as INR in 2 days from discharge.  Discharge Diagnoses:  Principal Problem:   Fractured hip, right, closed, initial encounter [facility]) Active Problems:   Atrial fibrillation (HCC)   Essential hypertension   CKD (chronic kidney disease) stage 3, GFR 30-59 ml/min   Chronic anticoagulation   BPH (benign prostatic hyperplasia)   Pressure injury of skin    Discharge Instructions  Discharge Instructions    Call MD for:  difficulty breathing, headache or visual disturbances    Complete by:  As  directed    Call MD for:  extreme fatigue    Complete by:  As directed    Call MD for:  persistant dizziness or light-headedness    Complete by:  As directed    Call MD for:  persistant nausea and vomiting    Complete by:  As directed    Call MD for:  redness, tenderness, or signs of infection (pain, swelling, redness, odor or green/yellow discharge around incision site)    Complete by:  As directed    Call MD for:  severe uncontrolled pain    Complete by:  As directed    Call MD for:  temperature >100.4    Complete by:  As directed    Diet - low sodium heart healthy    Complete by:  As directed    Full weight bearing    Complete by:  As directed        Medication List    STOP taking these medications   Ferrous Fumarate 150 MG Tabs   hydrALAZINE 50 MG tablet Commonly known as:  APRESOLINE     TAKE these medications   acetaminophen 500 MG tablet Commonly known as:  TYLENOL Take 1,000 mg by mouth every 8 (eight) hours as needed (pain). Maximum 3 tablets in 24 hours   albuterol (2.5 MG/3ML) 0.083% nebulizer solution Commonly known as:  PROVENTIL Take 2.5 mg by nebulization every 6 (six) hours as needed for wheezing.   amLODipine 10 MG tablet Commonly known as:  NORVASC Take 1 tablet (10 mg total) by mouth daily.   bimatoprost 0.01 % Soln Commonly known as:  LUMIGAN Place 1 drop into both eyes at bedtime.   brimonidine-timolol 0.2-0.5 % ophthalmic solution Commonly known as:  COMBIGAN Place 1 drop into both eyes every 12 (twelve) hours.   carvedilol 12.5 MG tablet Commonly known as:  COREG Take 12.5 mg by mouth 2 (two) times daily with a meal.   CENTRATEX 106-1 MG Caps Take 1 capsule by mouth daily.   diphenhydrAMINE 12.5 MG/5ML syrup Commonly known as:  BENYLIN Take 12.5 mg by mouth 3 (three) times daily as needed for itching.   docusate sodium 100 MG capsule Commonly known as:  COLACE Take 1 capsule (100 mg total) by mouth daily as needed for mild  constipation.   donepezil 10 MG tablet Commonly known as:  ARICEPT Take 10 mg by mouth at bedtime.   DULoxetine 30 MG capsule Commonly known as:  CYMBALTA Take 30 mg by mouth daily. What changed:  Another medication with the same name was removed. Continue taking this medication, and follow the directions you see here.   ENSURE PO Take 237 mLs by mouth 3 (three) times daily between meals. What changed:  Another medication with the same name was removed. Continue taking this medication, and follow the directions you see here.   fluticasone 50 MCG/ACT nasal spray Commonly known as:  FLONASE Place 2 sprays into both nostrils at bedtime.   HYDROcodone-acetaminophen 5-325 MG tablet Commonly known as:  NORCO/VICODIN Take 1 tablet by mouth every 6 (six) hours as needed for severe pain.   isosorbide-hydrALAZINE 20-37.5 MG tablet Commonly known as:  BIDIL Take 1 tablet by mouth 2 (two) times daily.   loperamide 2 MG capsule Commonly known as:  IMODIUM Take 2 mg by mouth every 6 (six) hours as needed for diarrhea or loose stools.   MYRBETRIQ 25 MG Tb24 tablet Generic drug:  mirabegron ER Take 25 mg by mouth daily.   ondansetron 4 MG tablet Commonly known as:  ZOFRAN Take 4 mg by mouth every 8 (eight) hours as needed for nausea or vomiting. Maximum 3 tablets in 25 hours   polyethylene glycol packet Commonly known as:  MIRALAX / GLYCOLAX Take 17 g by mouth 2 (two) times daily. Mix in 8 oz liquid and drink   ranitidine 150 MG tablet Commonly known as:  ZANTAC Take 150 mg by mouth at bedtime.   senna 8.6 MG Tabs tablet Commonly known as:  SENOKOT Take 1 tablet (8.6 mg total) by mouth daily. Start taking on:  06/10/2016   tamsulosin 0.4 MG Caps capsule Commonly known as:  FLOMAX Take 0.4 mg by mouth every evening. 5pm   warfarin 5 MG tablet Commonly known as:  COUMADIN Take 0.5 tablets (2.5 mg total) by mouth daily at 6 PM. Only resume after INR is within therapeutic window  of 2-3 Start taking on:  06/11/2016 What changed:  how much to take  when to take this  additional instructions       Contact information for follow-up providers    Mcarthur Rossetti, MD. Schedule an appointment as soon as possible for a visit in 2 week(s).   Specialty:  Orthopedic Surgery Contact information: [place] [address] [phone]            Contact information for after-discharge care    Destination    [facility] .   Specialty:  Skilled Nursing Engineer, manufacturing information: [place] [phone]  No Known Allergies  Consultations:  Orthopaedics  PT/OT   Procedures/Studies: Dg Chest 1 View  Result Date: 06/04/2016 CLINICAL DATA:  Fall, right hip pain, right femur fracture, initial encounter. EXAM: CHEST 1 VIEW COMPARISON:  06/20/2014. FINDINGS: Trachea is midline. Heart is enlarged. Devitalized wires overlie the heart. Mild patchy opacification in the lateral right midlung zone is new. Calcified granuloma in the left midlung zone. Lungs are otherwise clear. No pleural fluid. IMPRESSION: Mild patchy opacification in the lateral right midlung zone may be due to contusion and/or rib fractures. Electronically Signed   By: Lorin Picket M.D.   On: 06/04/2016 19:06   Ct Head Wo Contrast  Result Date: 06/04/2016 CLINICAL DATA:  Patient status post fall. No reported loss of consciousness. Head neck pain. EXAM: CT HEAD WITHOUT CONTRAST CT CERVICAL SPINE WITHOUT CONTRAST TECHNIQUE: Multidetector CT imaging of the head and cervical spine was performed following the standard protocol without intravenous contrast. Multiplanar CT image reconstructions of the cervical spine were also generated. COMPARISON:  Brain CT 03/21/2016. FINDINGS: CT HEAD FINDINGS Brain: Ventricles and sulci are prominent compatible with atrophy. Periventricular and subcortical white matter  hypodensity compatible with chronic microvascular ischemic changes. Additionally encephalomalacia is demonstrated throughout the right cerebral hemisphere compatible with remote infarct. Old right cerebellar hemisphere infarct. No evidence for acute cortically based infarct, intracranial hemorrhage, mass lesion or mass-effect. Old left basal ganglia lacunar infarct. Vascular: Internal carotid arterial calcifications Skull: Intact. Sinuses/Orbits: Paranasal sinuses are well aerated. Mastoid air cells are unremarkable. Other: None. CT CERVICAL SPINE FINDINGS Alignment: Normal anatomic alignment. Skull base and vertebrae: Irregular lucency through the right aspect of the C1 ring (image 17; series 7). Soft tissues and spinal canal: Unremarkable. Disc levels: Multilevel degenerative disc disease most pronounced C3-4, C4-5 and C5-6. No acute cervical spine fracture. Upper chest: No consolidative pulmonary opacities. There is a 1.9 cm nodule within the thyroid (image 85; series 6). Other: None. IMPRESSION: Irregular lucency through the posterior right aspect of the C1 ring likely sequelae of remote trauma or potentially secondary to a lytic lesion in the setting of metastatic disease, felt to be less likely. No acute intracranial process. No definite acute cervical spine fracture. Cortical atrophy and old cortically based infarcts. Electronically Signed   By: Lovey Newcomer M.D.   On: 06/04/2016 18:53   Ct Cervical Spine Wo Contrast  Result Date: 06/04/2016 CLINICAL DATA:  Patient status post fall. No reported loss of consciousness. Head neck pain. EXAM: CT HEAD WITHOUT CONTRAST CT CERVICAL SPINE WITHOUT CONTRAST TECHNIQUE: Multidetector CT imaging of the head and cervical spine was performed following the standard protocol without intravenous contrast. Multiplanar CT image reconstructions of the cervical spine were also generated. COMPARISON:  Brain CT 03/21/2016. FINDINGS: CT HEAD FINDINGS Brain: Ventricles and sulci  are prominent compatible with atrophy. Periventricular and subcortical white matter hypodensity compatible with chronic microvascular ischemic changes. Additionally encephalomalacia is demonstrated throughout the right cerebral hemisphere compatible with remote infarct. Old right cerebellar hemisphere infarct. No evidence for acute cortically based infarct, intracranial hemorrhage, mass lesion or mass-effect. Old left basal ganglia lacunar infarct. Vascular: Internal carotid arterial calcifications Skull: Intact. Sinuses/Orbits: Paranasal sinuses are well aerated. Mastoid air cells are unremarkable. Other: None. CT CERVICAL SPINE FINDINGS Alignment: Normal anatomic alignment. Skull base and vertebrae: Irregular lucency through the right aspect of the C1 ring (image 17; series 7). Soft tissues and spinal canal: Unremarkable. Disc levels: Multilevel degenerative disc disease most pronounced C3-4, C4-5 and C5-6. No acute cervical spine fracture.  Upper chest: No consolidative pulmonary opacities. There is a 1.9 cm nodule within the thyroid (image 85; series 6). Other: None. IMPRESSION: Irregular lucency through the posterior right aspect of the C1 ring likely sequelae of remote trauma or potentially secondary to a lytic lesion in the setting of metastatic disease, felt to be less likely. No acute intracranial process. No definite acute cervical spine fracture. Cortical atrophy and old cortically based infarcts. Electronically Signed   By: Lovey Newcomer M.D.   On: 06/04/2016 18:53   Dg Chest Port 1 View  Result Date: 06/09/2016 CLINICAL DATA:  Cough. EXAM: PORTABLE CHEST 1 VIEW COMPARISON:  06/04/2016 FINDINGS: The patient is mildly rotated to the left. Cardiac silhouette remains enlarged. Sequelae of prior CABG are again identified. Abandoned wires overlie the heart. Calcified granuloma in the left mid lung is unchanged. There is mild pulmonary vascular congestion with increasing interstitial densities bilaterally.  Mild patchy opacity is again seen in the right mid lung. No sizable pleural effusion or pneumothorax is identified. Degenerative changes are noted about the right shoulder. IMPRESSION: Cardiomegaly with increasing interstitial densities which may reflect mild edema. Electronically Signed   By: Logan Bores M.D.   On: 06/09/2016 13:14   Dg C-arm 1-60 Min  Result Date: 06/05/2016 CLINICAL DATA:  Right femoral intra medullary nail. EXAM: RIGHT FEMUR 2 VIEWS; DG C-ARM 61-120 MIN COMPARISON:  Right hip 06/04/2016 FINDINGS: Intraoperative fluoroscopy is obtained for surgical control purposes. Fluoroscopy time is recorded at 66 seconds. 9 spot fluoroscopic images are obtained. Spot fluoroscopic images obtained demonstrate internal reduction of comminuted inter trochanteric fractures of the right hip and fixation with long femoral intra medullary rod and compression bolt. Fracture fragments appear to be in near anatomic alignment and position. Surgical hardware appears intact. Vascular calcifications in the soft tissues. IMPRESSION: Intraoperative fluoroscopy for surgical control purposes demonstrating internal fixation of inter trochanteric right proximal femoral fractures using intra medullary rod and compression screw. Electronically Signed   By: Lucienne Capers M.D.   On: 06/05/2016 22:43   Dg Hip Unilat With Pelvis 2-3 Views Right  Result Date: 06/04/2016 CLINICAL DATA:  Fall onto right side, right hip pain, initial encounter. EXAM: DG HIP (WITH OR WITHOUT PELVIS) 2-3V RIGHT COMPARISON:  The the FINDINGS: There is a comminuted intertrochanteric fracture of the right femur with varus angulation. No dislocation. Left hip arthroplasty. Surgical clips in the anatomic pelvis. IMPRESSION: Intertrochanteric right femur fracture. Electronically Signed   By: Lorin Picket M.D.   On: 06/04/2016 19:04   Dg Femur, Min 2 Views Right  Result Date: 06/05/2016 CLINICAL DATA:  Right femoral intra medullary nail. EXAM:  RIGHT FEMUR 2 VIEWS; DG C-ARM 61-120 MIN COMPARISON:  Right hip 06/04/2016 FINDINGS: Intraoperative fluoroscopy is obtained for surgical control purposes. Fluoroscopy time is recorded at 66 seconds. 9 spot fluoroscopic images are obtained. Spot fluoroscopic images obtained demonstrate internal reduction of comminuted inter trochanteric fractures of the right hip and fixation with long femoral intra medullary rod and compression bolt. Fracture fragments appear to be in near anatomic alignment and position. Surgical hardware appears intact. Vascular calcifications in the soft tissues. IMPRESSION: Intraoperative fluoroscopy for surgical control purposes demonstrating internal fixation of inter trochanteric right proximal femoral fractures using intra medullary rod and compression screw. Electronically Signed   By: Lucienne Capers M.D.   On: 06/05/2016 22:43     Subjective: Patient seen and evaluated.  He is awake and pleasant but oriented to self.  Has a cough which says comes  and goes.  Has gotten up and worked with PT.  Plans to work with discharge to SNF today.  Discharge Exam: Vitals:   06/09/16 0821 06/09/16 1322  BP: 130/64 (!) 105/41  Pulse: 73 75  Resp:  16  Temp:  97.7 F (36.5 C)   Vitals:   06/09/16 0105 06/09/16 0526 06/09/16 0821 06/09/16 1322  BP: (!) 104/54 (!) 123/58 130/64 (!) 105/41  Pulse: 79 75 73 75  Resp:  16  16  Temp:  97.7 F (36.5 C)  97.7 F (36.5 C)  TempSrc:  Axillary  Oral  SpO2:  95% 94% 95%    General: Pt is alert, awake, not in acute distress Cardiovascular: RRR, S1/S2 +, no rubs, no gallops Respiratory: no wheezing, no rhonchi, upper airway noises appreciated Abdominal: Soft, NT, ND, bowel sounds + Extremities: no edema, no cyanosis, only able to minimally move right lower extremity without much pain    The results of significant diagnostics from this hospitalization (including imaging, microbiology, ancillary and laboratory) are listed below for  reference.     Microbiology: Recent Results (from the past 240 hour(s))  MRSA PCR Screening     Status: Abnormal   Collection Time: 06/05/16  5:56 AM  Result Value Ref Range Status   MRSA by PCR POSITIVE (A) NEGATIVE Final    Comment:        The GeneXpert MRSA Assay (FDA approved for NASAL specimens only), is one component of a comprehensive MRSA colonization surveillance program. It is not intended to diagnose MRSA infection nor to guide or monitor treatment for MRSA infections. RESULT CALLED TO, READ BACK BY AND VERIFIED WITH: G ADAMS,RN AT 0813 06/05/16 BY L BENFIELD      Labs: BNP (last 3 results) No results for input(s): BNP in the last 8760 hours. Basic Metabolic Panel:  Recent Labs Lab 06/05/16 0547 06/06/16 0458 06/07/16 0438 06/09/16 0503 06/09/16 1048  NA 138 139 138 145 140  K 3.6 4.3 4.0 4.7 4.5  CL 105 105 105 111 106  CO2 24 27 25 23 29   GLUCOSE 119* 116* 110* 120* 119*  BUN 19 19 25* 46* 42*  CREATININE 1.58* 1.67* 1.78* 1.65* 1.57*  CALCIUM 9.0 8.8* 8.7* 9.2 9.0   Liver Function Tests: No results for input(s): AST, ALT, ALKPHOS, BILITOT, PROT, ALBUMIN in the last 168 hours. No results for input(s): LIPASE, AMYLASE in the last 168 hours. No results for input(s): AMMONIA in the last 168 hours. CBC:  Recent Labs Lab 06/04/16 1748 06/05/16 0547 06/06/16 0458 06/07/16 0438 06/08/16 0323 06/08/16 1512 06/09/16 0503  WBC 5.9 5.1 10.9* 9.8 8.9  --  7.0  NEUTROABS 4.4 3.6  --   --   --   --   --   HGB 9.4* 8.8* 8.5* 7.6* 7.7* 7.6* 8.9*  HCT 28.7* 26.9* 26.7* 23.0* 23.3* 23.1* 27.2*  MCV 90.8 92.4 93.0 91.3 91.4  --  91.3  PLT 197 178 162 142* 141*  --  156   Cardiac Enzymes: No results for input(s): CKTOTAL, CKMB, CKMBINDEX, TROPONINI in the last 168 hours. BNP: Invalid input(s): POCBNP CBG: No results for input(s): GLUCAP in the last 168 hours. D-Dimer No results for input(s): DDIMER in the last 72 hours. Hgb A1c No results for  input(s): HGBA1C in the last 72 hours. Lipid Profile No results for input(s): CHOL, HDL, LDLCALC, TRIG, CHOLHDL, LDLDIRECT in the last 72 hours. Thyroid function studies No results for input(s): TSH, T4TOTAL, T3FREE, THYROIDAB in  the last 72 hours.  Invalid input(s): FREET3 Anemia work up No results for input(s): VITAMINB12, FOLATE, FERRITIN, TIBC, IRON, RETICCTPCT in the last 72 hours. Urinalysis    Component Value Date/Time   COLORURINE YELLOW 09/03/2014 2025   APPEARANCEUR CLEAR 09/03/2014 2025   LABSPEC 1.016 09/03/2014 2025   PHURINE 6.5 09/03/2014 2025   GLUCOSEU NEGATIVE 09/03/2014 2025   HGBUR NEGATIVE 09/03/2014 2025   BILIRUBINUR NEGATIVE 09/03/2014 2025   KETONESUR NEGATIVE 09/03/2014 2025   PROTEINUR 30 (A) 09/03/2014 2025   UROBILINOGEN 0.2 09/03/2014 2025   NITRITE NEGATIVE 09/03/2014 2025   LEUKOCYTESUR NEGATIVE 09/03/2014 2025   Sepsis Labs Invalid input(s): PROCALCITONIN,  WBC,  LACTICIDVEN Microbiology Recent Results (from the past 240 hour(s))  MRSA PCR Screening     Status: Abnormal   Collection Time: 06/05/16  5:56 AM  Result Value Ref Range Status   MRSA by PCR POSITIVE (A) NEGATIVE Final    Comment:        The GeneXpert MRSA Assay (FDA approved for NASAL specimens only), is one component of a comprehensive MRSA colonization surveillance program. It is not intended to diagnose MRSA infection nor to guide or monitor treatment for MRSA infections. RESULT CALLED TO, READ BACK BY AND VERIFIED WITH: G ADAMS,RN AT [id number] 06/05/16 BY L BENFIELD      Time coordinating discharge: Over 30 minutes  SIGNED:   Loretha Stapler, MD  [facility] 06/09/2016, 2:16 PM Pager [phone] If 7PM-7AM, please contact night-coverage [email or web address] Password [email or web address]

## 2016-06-09 NOTE — Clinical Social Work Note (Signed)
Per Rollene Fare with [facility] she will be at [facility] at 1:00 to do paperwork with pt and family.  [address], [place]

## 2016-06-09 NOTE — Progress Notes (Signed)
Report given to Rosealee Albee at [facility]. All questions/concerns addressed. PTAR to transport. Will continue to monitor

## 2016-06-12 ENCOUNTER — Non-Acute Institutional Stay (SKILLED_NURSING_FACILITY): Payer: Medicare Other | Admitting: Adult Health

## 2016-06-12 ENCOUNTER — Encounter: Payer: Self-pay | Admitting: Adult Health

## 2016-06-12 DIAGNOSIS — D62 Acute posthemorrhagic anemia: Secondary | ICD-10-CM

## 2016-06-12 DIAGNOSIS — I10 Essential (primary) hypertension: Secondary | ICD-10-CM

## 2016-06-12 DIAGNOSIS — H409 Unspecified glaucoma: Secondary | ICD-10-CM | POA: Diagnosis not present

## 2016-06-12 DIAGNOSIS — Z7901 Long term (current) use of anticoagulants: Secondary | ICD-10-CM | POA: Diagnosis not present

## 2016-06-12 DIAGNOSIS — K5900 Constipation, unspecified: Secondary | ICD-10-CM | POA: Diagnosis not present

## 2016-06-12 DIAGNOSIS — S72001S Fracture of unspecified part of neck of right femur, sequela: Secondary | ICD-10-CM | POA: Diagnosis not present

## 2016-06-12 DIAGNOSIS — R2681 Unsteadiness on feet: Secondary | ICD-10-CM | POA: Diagnosis not present

## 2016-06-12 DIAGNOSIS — G309 Alzheimer's disease, unspecified: Secondary | ICD-10-CM

## 2016-06-12 DIAGNOSIS — R339 Retention of urine, unspecified: Secondary | ICD-10-CM

## 2016-06-12 DIAGNOSIS — N183 Chronic kidney disease, stage 3 unspecified: Secondary | ICD-10-CM

## 2016-06-12 DIAGNOSIS — N3281 Overactive bladder: Secondary | ICD-10-CM

## 2016-06-12 DIAGNOSIS — K219 Gastro-esophageal reflux disease without esophagitis: Secondary | ICD-10-CM

## 2016-06-12 DIAGNOSIS — N400 Enlarged prostate without lower urinary tract symptoms: Secondary | ICD-10-CM

## 2016-06-12 DIAGNOSIS — F0280 Dementia in other diseases classified elsewhere without behavioral disturbance: Secondary | ICD-10-CM

## 2016-06-12 NOTE — Progress Notes (Signed)
DATE:  06/12/2016   MRN:  [id number]  BIRTHDAY: Apr 21, 1919  Facility:  Nursing Home Location:  [place] Room Number: 1205-P  LEVEL OF CARE:  SNF (31)  Contact Information    Name Relation Home Work Ursa Daughter   [phone]   Jaclyn Shaggy   [phone]   Judithann Sheen   [phone]       Code Status History    Date Active Date Inactive Code Status Order ID Comments User Context   06/04/2016 10:51 PM 06/09/2016  9:00 PM Full Code [id number]  Norval Morton, MD Inpatient   09/04/2014 12:15 AM 09/08/2014  7:03 PM Full Code [id number]  Ivor Costa, MD ED   06/22/2014  9:49 PM 06/24/2014  9:11 PM Full Code [id number]  Meredith Pel, MD Inpatient   06/20/2014 10:53 PM 06/22/2014  9:49 PM Full Code [id number]  Jani Gravel, MD ED       Chief Complaint  Patient presents with  . Hospitalization Follow-up    HISTORY OF PRESENT ILLNESS:  This is a 82 year old male who was PMH of hypertension, CVA, chronic atrial fibrillation and Alzheimer's dementia. He has been admitted to [facility] on 06/09/16 from [facility] hospitalization 06/04/16 to 06/09/16. He had a fall and sustained a fractured right hip. Dr. Ninfa Linden suggested patient to be given vitamin K and did surgery on 11/27. He had postop anemia and had blood transfusion of 2 units packed RBC.  He has been admitted for a short-term rehabilitation.    PAST MEDICAL HISTORY:  Past Medical History:  Diagnosis Date  . Alzheimer's disease, focal onset   . Atrial fibrillation ([place])   . CAD (coronary artery disease)   . Chronic atrial fibrillation ([place])   . High blood pressure   . Low back pain   . Memory loss   . Prostate cancer ([place])   . Recurrent major depression ([place])   . Stroke [facility])      CURRENT MEDICATIONS: Reviewed  Patient's Medications  New Prescriptions   No medications on file  Previous Medications   ACETAMINOPHEN (TYLENOL) 500 MG  TABLET    Take 1,000 mg by mouth every 8 (eight) hours as needed (pain). Maximum 3 tablets in 24 hours   ALBUTEROL (PROVENTIL) (2.5 MG/3ML) 0.083% NEBULIZER SOLUTION    Take 2.5 mg by nebulization every 6 (six) hours as needed for wheezing.    AMLODIPINE (NORVASC) 10 MG TABLET    Take 1 tablet (10 mg total) by mouth daily.   BRIMONIDINE-TIMOLOL (COMBIGAN) 0.2-0.5 % OPHTHALMIC SOLUTION    Place 1 drop into both eyes every 12 (twelve) hours.   CARVEDILOL (COREG) 12.5 MG TABLET    Take 12.5 mg by mouth 2 (two) times daily with a meal.    DIPHENHYDRAMINE (BENYLIN) 12.5 MG/5ML SYRUP    Take 12.5 mg by mouth 3 (three) times daily as needed for itching.   DOCUSATE SODIUM (COLACE) 100 MG CAPSULE    Take 1 capsule (100 mg total) by mouth daily as needed for mild constipation.   DONEPEZIL (ARICEPT) 10 MG TABLET    Take 10 mg by mouth at bedtime.   DULOXETINE (CYMBALTA) 30 MG CAPSULE    Take 30 mg by mouth daily.   FE FUM-FA-B CMP-C-ZN-MG-MN-CU (CENTRATEX) 106-1 MG CAPS    Take 1 capsule by mouth daily.   FLUTICASONE (FLONASE) 50 MCG/ACT NASAL SPRAY    Place 2 sprays into both nostrils at  bedtime.   HYDROCODONE-ACETAMINOPHEN (NORCO/VICODIN) 5-325 MG TABLET    Take 1 tablet by mouth every 6 (six) hours as needed for severe pain.   ISOSORBIDE-HYDRALAZINE (BIDIL) 20-37.5 MG PER TABLET    Take 1 tablet by mouth 2 (two) times daily.   LATANOPROST (XALATAN) 0.005 % OPHTHALMIC SOLUTION    Place 1 drop into both eyes at bedtime.   LOPERAMIDE (IMODIUM) 2 MG CAPSULE    Take 2 mg by mouth every 6 (six) hours as needed for diarrhea or loose stools.   MIRABEGRON ER (MYRBETRIQ) 25 MG TB24 TABLET    Take 25 mg by mouth daily.   NUTRITIONAL SUPPLEMENT LIQD    Take 120 mLs by mouth 3 (three) times daily.   ONDANSETRON (ZOFRAN) 4 MG TABLET    Take 4 mg by mouth every 8 (eight) hours as needed for nausea or vomiting. Maximum 3 tablets in 25 hours   POLYETHYLENE GLYCOL (MIRALAX / GLYCOLAX) PACKET    Take 17 g by mouth 2 (two)  times daily. Mix in 8 oz liquid and drink   RANITIDINE (ZANTAC) 150 MG TABLET    Take 150 mg by mouth at bedtime.   SENNA (SENOKOT) 8.6 MG TABS TABLET    Take 1 tablet (8.6 mg total) by mouth daily.   TAMSULOSIN (FLOMAX) 0.4 MG CAPS CAPSULE    Take 0.4 mg by mouth every evening. 5pm    WARFARIN (COUMADIN) 2.5 MG TABLET    Take 2.5 mg by mouth one time only at 6 PM.  Modified Medications   No medications on file  Discontinued Medications   BIMATOPROST (LUMIGAN) 0.01 % SOLN    Place 1 drop into both eyes at bedtime.   NUTRITIONAL SUPPLEMENTS (ENSURE PO)    Take 237 mLs by mouth 3 (three) times daily between meals.   WARFARIN (COUMADIN) 5 MG TABLET    Take 0.5 tablets (2.5 mg total) by mouth daily at 6 PM. Only resume after INR is within therapeutic window of 2-3     No Known Allergies   REVIEW OF SYSTEMS:  GENERAL: no change in appetite, no fatigue, no weight changes, no fever, chills or weakness EYES: Denies change in vision, dry eyes, eye pain, itching or discharge EARS: Denies change in hearing, ringing in ears, or earache NOSE: Denies nasal congestion or epistaxis MOUTH and THROAT: Denies oral discomfort, gingival pain or bleeding, pain from teeth or hoarseness   RESPIRATORY: no cough, SOB, DOE, wheezing, hemoptysis CARDIAC: no chest pain, edema or palpitations GI: no abdominal pain, constipation, heart burn, nausea or vomiting, +diarrhea,  GU: Denies dysuria, frequency, hematuria, incontinence, or discharge PSYCHIATRIC: Denies feeling of depression or anxiety. No report of hallucinations, insomnia, paranoia, or agitation   PHYSICAL EXAMINATION  GENERAL APPEARANCE: Well nourished. In no acute distress. Normal body habitus SKIN:  Right hip has 2 surgical incisions has staples, dry, no erythema  HEAD: Normal in size and contour. No evidence of trauma EYES: Lids open and close normally. No blepharitis, entropion or ectropion. PERRL. Conjunctivae are clear and sclerae are white.  Lenses are without opacity EARS: Pinnae are normal. Patient hears normal voice tunes of the examiner MOUTH and THROAT: Lips are without lesions. Oral mucosa is moist and without lesions. Tongue is normal in shape, size, and color and without lesions NECK: supple, trachea midline, no neck masses, no thyroid tenderness, no thyromegaly LYMPHATICS: no LAN in the neck, no supraclavicular LAN RESPIRATORY: breathing is even & unlabored, BS CTAB CARDIAC: RRR, no murmur,no  extra heart sounds, no edema GI: abdomen soft, normal BS, no masses, no tenderness, no hepatomegaly, no splenomegaly GU:  Has foley catheter draining to urine leg bag EXTREMITIES:  Able to move 4 extremities; limited ROM on RLE due to surgery PSYCHIATRIC: Alert to person, disoriented to time and place. Affect and behavior are appropriate:    LABS/RADIOLOGY: Labs reviewed: Basic Metabolic Panel:  Recent Labs  06/09/16 0503 06/09/16 1048 06/09/16 1633  NA 145 140 139  K 4.7 4.5 4.3  CL 111 106 105  CO2 23 29 27   GLUCOSE 120* 119* 137*  BUN 46* 42* 42*  CREATININE 1.65* 1.57* 1.54*  CALCIUM 9.2 9.0 8.8*    CBC:  Recent Labs  06/04/16 1748 06/05/16 0547  06/07/16 0438 06/08/16 0323 06/08/16 1512 06/09/16 0503  WBC 5.9 5.1  < > 9.8 8.9  --  7.0  NEUTROABS 4.4 3.6  --   --   --   --   --   HGB 9.4* 8.8*  < > 7.6* 7.7* 7.6* 8.9*  HCT 28.7* 26.9*  < > 23.0* 23.3* 23.1* 27.2*  MCV 90.8 92.4  < > 91.3 91.4  --  91.3  PLT 197 178  < > 142* 141*  --  156  < > = values in this interval not displayed.     Dg Chest 1 View  Result Date: 06/04/2016 CLINICAL DATA:  Fall, right hip pain, right femur fracture, initial encounter. EXAM: CHEST 1 VIEW COMPARISON:  06/20/2014. FINDINGS: Trachea is midline. Heart is enlarged. Devitalized wires overlie the heart. Mild patchy opacification in the lateral right midlung zone is new. Calcified granuloma in the left midlung zone. Lungs are otherwise clear. No pleural fluid.  IMPRESSION: Mild patchy opacification in the lateral right midlung zone may be due to contusion and/or rib fractures. Electronically Signed   By: Lorin Picket M.D.   On: 06/04/2016 19:06   Ct Head Wo Contrast  Result Date: 06/04/2016 CLINICAL DATA:  Patient status post fall. No reported loss of consciousness. Head neck pain. EXAM: CT HEAD WITHOUT CONTRAST CT CERVICAL SPINE WITHOUT CONTRAST TECHNIQUE: Multidetector CT imaging of the head and cervical spine was performed following the standard protocol without intravenous contrast. Multiplanar CT image reconstructions of the cervical spine were also generated. COMPARISON:  Brain CT 03/21/2016. FINDINGS: CT HEAD FINDINGS Brain: Ventricles and sulci are prominent compatible with atrophy. Periventricular and subcortical white matter hypodensity compatible with chronic microvascular ischemic changes. Additionally encephalomalacia is demonstrated throughout the right cerebral hemisphere compatible with remote infarct. Old right cerebellar hemisphere infarct. No evidence for acute cortically based infarct, intracranial hemorrhage, mass lesion or mass-effect. Old left basal ganglia lacunar infarct. Vascular: Internal carotid arterial calcifications Skull: Intact. Sinuses/Orbits: Paranasal sinuses are well aerated. Mastoid air cells are unremarkable. Other: None. CT CERVICAL SPINE FINDINGS Alignment: Normal anatomic alignment. Skull base and vertebrae: Irregular lucency through the right aspect of the C1 ring (image 17; series 7). Soft tissues and spinal canal: Unremarkable. Disc levels: Multilevel degenerative disc disease most pronounced C3-4, C4-5 and C5-6. No acute cervical spine fracture. Upper chest: No consolidative pulmonary opacities. There is a 1.9 cm nodule within the thyroid (image 85; series 6). Other: None. IMPRESSION: Irregular lucency through the posterior right aspect of the C1 ring likely sequelae of remote trauma or potentially secondary to a lytic  lesion in the setting of metastatic disease, felt to be less likely. No acute intracranial process. No definite acute cervical spine fracture. Cortical atrophy and old cortically  based infarcts. Electronically Signed   By: Lovey Newcomer M.D.   On: 06/04/2016 18:53   Ct Cervical Spine Wo Contrast  Result Date: 06/04/2016 CLINICAL DATA:  Patient status post fall. No reported loss of consciousness. Head neck pain. EXAM: CT HEAD WITHOUT CONTRAST CT CERVICAL SPINE WITHOUT CONTRAST TECHNIQUE: Multidetector CT imaging of the head and cervical spine was performed following the standard protocol without intravenous contrast. Multiplanar CT image reconstructions of the cervical spine were also generated. COMPARISON:  Brain CT 03/21/2016. FINDINGS: CT HEAD FINDINGS Brain: Ventricles and sulci are prominent compatible with atrophy. Periventricular and subcortical white matter hypodensity compatible with chronic microvascular ischemic changes. Additionally encephalomalacia is demonstrated throughout the right cerebral hemisphere compatible with remote infarct. Old right cerebellar hemisphere infarct. No evidence for acute cortically based infarct, intracranial hemorrhage, mass lesion or mass-effect. Old left basal ganglia lacunar infarct. Vascular: Internal carotid arterial calcifications Skull: Intact. Sinuses/Orbits: Paranasal sinuses are well aerated. Mastoid air cells are unremarkable. Other: None. CT CERVICAL SPINE FINDINGS Alignment: Normal anatomic alignment. Skull base and vertebrae: Irregular lucency through the right aspect of the C1 ring (image 17; series 7). Soft tissues and spinal canal: Unremarkable. Disc levels: Multilevel degenerative disc disease most pronounced C3-4, C4-5 and C5-6. No acute cervical spine fracture. Upper chest: No consolidative pulmonary opacities. There is a 1.9 cm nodule within the thyroid (image 85; series 6). Other: None. IMPRESSION: Irregular lucency through the posterior right aspect  of the C1 ring likely sequelae of remote trauma or potentially secondary to a lytic lesion in the setting of metastatic disease, felt to be less likely. No acute intracranial process. No definite acute cervical spine fracture. Cortical atrophy and old cortically based infarcts. Electronically Signed   By: Lovey Newcomer M.D.   On: 06/04/2016 18:53   Dg Chest Port 1 View  Result Date: 06/09/2016 CLINICAL DATA:  Cough. EXAM: PORTABLE CHEST 1 VIEW COMPARISON:  06/04/2016 FINDINGS: The patient is mildly rotated to the left. Cardiac silhouette remains enlarged. Sequelae of prior CABG are again identified. Abandoned wires overlie the heart. Calcified granuloma in the left mid lung is unchanged. There is mild pulmonary vascular congestion with increasing interstitial densities bilaterally. Mild patchy opacity is again seen in the right mid lung. No sizable pleural effusion or pneumothorax is identified. Degenerative changes are noted about the right shoulder. IMPRESSION: Cardiomegaly with increasing interstitial densities which may reflect mild edema. Electronically Signed   By: Logan Bores M.D.   On: 06/09/2016 13:14   Dg C-arm 1-60 Min  Result Date: 06/05/2016 CLINICAL DATA:  Right femoral intra medullary nail. EXAM: RIGHT FEMUR 2 VIEWS; DG C-ARM 61-120 MIN COMPARISON:  Right hip 06/04/2016 FINDINGS: Intraoperative fluoroscopy is obtained for surgical control purposes. Fluoroscopy time is recorded at 66 seconds. 9 spot fluoroscopic images are obtained. Spot fluoroscopic images obtained demonstrate internal reduction of comminuted inter trochanteric fractures of the right hip and fixation with long femoral intra medullary rod and compression bolt. Fracture fragments appear to be in near anatomic alignment and position. Surgical hardware appears intact. Vascular calcifications in the soft tissues. IMPRESSION: Intraoperative fluoroscopy for surgical control purposes demonstrating internal fixation of inter  trochanteric right proximal femoral fractures using intra medullary rod and compression screw. Electronically Signed   By: Lucienne Capers M.D.   On: 06/05/2016 22:43   Dg Hip Unilat With Pelvis 2-3 Views Right  Result Date: 06/04/2016 CLINICAL DATA:  Fall onto right side, right hip pain, initial encounter. EXAM: DG HIP (WITH OR WITHOUT PELVIS)  2-3V RIGHT COMPARISON:  The the FINDINGS: There is a comminuted intertrochanteric fracture of the right femur with varus angulation. No dislocation. Left hip arthroplasty. Surgical clips in the anatomic pelvis. IMPRESSION: Intertrochanteric right femur fracture. Electronically Signed   By: Lorin Picket M.D.   On: 06/04/2016 19:04   Dg Femur, Min 2 Views Right  Result Date: 06/05/2016 CLINICAL DATA:  Right femoral intra medullary nail. EXAM: RIGHT FEMUR 2 VIEWS; DG C-ARM 61-120 MIN COMPARISON:  Right hip 06/04/2016 FINDINGS: Intraoperative fluoroscopy is obtained for surgical control purposes. Fluoroscopy time is recorded at 66 seconds. 9 spot fluoroscopic images are obtained. Spot fluoroscopic images obtained demonstrate internal reduction of comminuted inter trochanteric fractures of the right hip and fixation with long femoral intra medullary rod and compression bolt. Fracture fragments appear to be in near anatomic alignment and position. Surgical hardware appears intact. Vascular calcifications in the soft tissues. IMPRESSION: Intraoperative fluoroscopy for surgical control purposes demonstrating internal fixation of inter trochanteric right proximal femoral fractures using intra medullary rod and compression screw. Electronically Signed   By: Lucienne Capers M.D.   On: 06/05/2016 22:43    ASSESSMENT/PLAN:  Unsteady gait - for rehabilitation, PT and OT, for therapeutic strengthening exercises; fall precaution  Fractured right hip S/P IM nail - for rehabilitation, PT and OT, for therapeutic strengthening exercises; follow-up with orthopedics in 2  weeks; continue acetaminophen 500 mg 2 tabs = 1000 mg by mouth every 8 hours when necessary and Norco 5/325 mg 1 tab by mouth every 6 hours when necessary for pain; Coumadin 2.5 mg by mouth daily for DVT prophylaxis  Long-term use of anticoagulant - INR 2.5; continue Coumadin 2.5 mg 1 tab by mouth every Day  Hypertension - continue amlodipine 10 mg 1 tab by mouth daily and BiDil 20-37.5 mg 1 tab by mouth twice a day  Glaucoma - continue Combigan 0.2-0.5% 1 drop to both eyes daily   Constipation - reported to have watery stools; DC senna; continue Colace 100 mg 1 capsule by mouth daily when necessary and change Miralax 17 gm PO BID to PRN  Alzheimer's dementia  - continue Aricept 10 mg 1 tab by mouth daily at bedtime  Depression - continue Cymbalta 30 mg 1 tab by mouth daily  Overactive bladder - continue Myrbetriq 25 mg 1 tab by mouth daily  BPH - continue Flomax 0.4 mg 1 capsule by mouth every evening  CKD, stage III - GFR 37; check BMP Lab Results  Component Value Date   CREATININE 1.54 (H) 06/09/2016    Anemia, acute blood loss - check CBC Lab Results  Component Value Date   HGB 8.9 (L) 06/09/2016   GERD - continue ranitidine 150 mg 1 tab by mouth daily at bedtime     Goals of care:  Short-term rehabilitation    Lichelle Viets C. [place]  -  NP [employer] [phone]

## 2016-06-13 ENCOUNTER — Encounter: Payer: Self-pay | Admitting: Internal Medicine

## 2016-06-13 ENCOUNTER — Non-Acute Institutional Stay (SKILLED_NURSING_FACILITY): Payer: Medicare Other | Admitting: Internal Medicine

## 2016-06-13 DIAGNOSIS — G309 Alzheimer's disease, unspecified: Secondary | ICD-10-CM | POA: Diagnosis not present

## 2016-06-13 DIAGNOSIS — S72141S Displaced intertrochanteric fracture of right femur, sequela: Secondary | ICD-10-CM | POA: Diagnosis not present

## 2016-06-13 DIAGNOSIS — N183 Chronic kidney disease, stage 3 unspecified: Secondary | ICD-10-CM

## 2016-06-13 DIAGNOSIS — I10 Essential (primary) hypertension: Secondary | ICD-10-CM

## 2016-06-13 DIAGNOSIS — F329 Major depressive disorder, single episode, unspecified: Secondary | ICD-10-CM

## 2016-06-13 DIAGNOSIS — K219 Gastro-esophageal reflux disease without esophagitis: Secondary | ICD-10-CM

## 2016-06-13 DIAGNOSIS — F0280 Dementia in other diseases classified elsewhere without behavioral disturbance: Secondary | ICD-10-CM

## 2016-06-13 DIAGNOSIS — I481 Persistent atrial fibrillation: Secondary | ICD-10-CM

## 2016-06-13 DIAGNOSIS — D62 Acute posthemorrhagic anemia: Secondary | ICD-10-CM

## 2016-06-13 DIAGNOSIS — R531 Weakness: Secondary | ICD-10-CM | POA: Diagnosis not present

## 2016-06-13 DIAGNOSIS — N400 Enlarged prostate without lower urinary tract symptoms: Secondary | ICD-10-CM

## 2016-06-13 DIAGNOSIS — I4819 Other persistent atrial fibrillation: Secondary | ICD-10-CM

## 2016-06-13 LAB — BASIC METABOLIC PANEL
BUN: 36 mg/dL — AB (ref 4–21)
Creatinine: 1.4 mg/dL — AB (ref 0.6–1.3)
Glucose: 125 mg/dL
Potassium: 4.2 mmol/L (ref 3.4–5.3)
SODIUM: 142 mmol/L (ref 137–147)

## 2016-06-13 LAB — CBC AND DIFFERENTIAL
HEMATOCRIT: 30 % — AB (ref 41–53)
HEMOGLOBIN: 9.6 g/dL — AB (ref 13.5–17.5)
Platelets: 230 10*3/uL (ref 150–399)
WBC: 6.1 10^3/mL

## 2016-06-13 NOTE — Progress Notes (Signed)
LOCATION: [facility]  PCP: Melinda Crutch, MD   Code Status: Full Code  Goals of care: Advanced Directive information Advanced Directives 09/04/2014  Does Patient Have a Medical Advance Directive? Yes  Type of Advance Directive [place] in Chart? Yes  Would patient like information on creating a medical advance directive? -       Extended Emergency Contact Information Primary Emergency Contact: [facility] Address: [address] [place], [place] [address] [place] Mobile Phone: [phone] Relation: Daughter Secondary Emergency Contact: Marcum,Daniel Address: [address] [place], [place] [address] [place] Phone: [phone] Relation: Son   No Known Allergies  Chief Complaint  Patient presents with  . New Admit To SNF    New Admission Visit     HPI:  Patient is a 82 y.o. male seen today for short term rehabilitation post hospital admission from 06/04/2016-06/09/2016 with right hip fracture post fall. Because of him being on Coumadin for atrial fibrillation, this had to be reversed with vitamin K first. He then underwent surgical replacement of the right hip on 06/05/2016. He required 2 unit packed red blood cell transfusion. He has medical history of atrial fibrillation, hypertension, COPD stage III, BPH, Alzheimer's dementia among others and was residing at assisted living facility prior to this hospitalization. He is seen in his room today.  Review of Systems:  Constitutional: Negative for fever, chills, diaphoresis.  HENT: Negative for headache, congestion, nasal discharge. Has hearing loss.  Eyes: Negative for double vision and discharge.  Respiratory: Negative for cough, shortness of breath and wheezing.   Cardiovascular: Negative for chest pain, palpitation  Gastrointestinal: Negative for heartburn, nausea, vomiting, abdominal pain. Last bowel  movement was this morning. Genitourinary: Negative for dysuria and flank pain. Musculoskeletal: Negative for back pain, fall in the facility.  Skin: Negative for itching, rash.  Neurological: Negative for dizziness. Psychiatric/Behavioral: Negative for depression   Past Medical History:  Diagnosis Date  . Alzheimer's disease, focal onset   . Atrial fibrillation ([place])   . CAD (coronary artery disease)   . Chronic atrial fibrillation ([place])   . High blood pressure   . Low back pain   . Memory loss   . Prostate cancer ([place])   . Recurrent major depression ([place])   . Stroke [facility])    Past Surgical History:  Procedure Laterality Date  . CATARACT EXTRACTION    . CORONARY ARTERY BYPASS GRAFT    . FEMUR IM NAIL Right 06/05/2016   Procedure: INTRAMEDULLARY (IM) NAIL FEMORAL;  Surgeon: Mcarthur Rossetti, MD;  Location: [facility];  Service: Orthopedics;  Laterality: Right;  . HIP ARTHROPLASTY Left 06/22/2014   Procedure: LEFT HEMIARTHROPLASTY HIP;  Surgeon: Meredith Pel, MD;  Location: [facility];  Service: Orthopedics;  Laterality: Left;  . PROSTATECTOMY     Social History:   reports that he quit smoking about 59 years ago. His smoking use included Cigarettes. He has a 1.00 pack-year smoking history. He has never used smokeless tobacco. He reports that he does not drink alcohol or use drugs.  Family History  Problem Relation Age of Onset  . Cancer Father   . High blood pressure      Medications:   Medication List       Accurate as of 06/13/16  2:48 PM.  Always use your most recent med list.          acetaminophen 500 MG tablet Commonly known as:  TYLENOL Take 1,000 mg by mouth every 8 (eight) hours as needed (pain). Maximum 3 tablets in 24 hours   albuterol (2.5 MG/3ML) 0.083% nebulizer solution Commonly known as:  PROVENTIL Take 2.5 mg by nebulization every 6 (six) hours as needed for wheezing.   amLODipine 10 MG tablet Commonly known as:  NORVASC Take 1 tablet (10 mg  total) by mouth daily.   brimonidine-timolol 0.2-0.5 % ophthalmic solution Commonly known as:  COMBIGAN Place 1 drop into both eyes every 12 (twelve) hours.   carvedilol 12.5 MG tablet Commonly known as:  COREG Take 12.5 mg by mouth 2 (two) times daily with a meal.   CENTRATEX 106-1 MG Caps Take 1 capsule by mouth daily.   diphenhydrAMINE 12.5 MG/5ML syrup Commonly known as:  BENYLIN Take 12.5 mg by mouth 3 (three) times daily as needed for itching.   docusate sodium 100 MG capsule Commonly known as:  COLACE Take 1 capsule (100 mg total) by mouth daily as needed for mild constipation.   donepezil 10 MG tablet Commonly known as:  ARICEPT Take 10 mg by mouth at bedtime.   DULoxetine 30 MG capsule Commonly known as:  CYMBALTA Take 30 mg by mouth daily.   fluticasone 50 MCG/ACT nasal spray Commonly known as:  FLONASE Place 2 sprays into both nostrils at bedtime.   HYDROcodone-acetaminophen 5-325 MG tablet Commonly known as:  NORCO/VICODIN Take 1 tablet by mouth every 6 (six) hours as needed for severe pain.   isosorbide-hydrALAZINE 20-37.5 MG tablet Commonly known as:  BIDIL Take 1 tablet by mouth 2 (two) times daily.   latanoprost 0.005 % ophthalmic solution Commonly known as:  XALATAN Place 1 drop into both eyes at bedtime.   loperamide 2 MG capsule Commonly known as:  IMODIUM Take 2 mg by mouth every 6 (six) hours as needed for diarrhea or loose stools.   MYRBETRIQ 25 MG Tb24 tablet Generic drug:  mirabegron ER Take 25 mg by mouth daily.   NUTRITIONAL SUPPLEMENT Liqd Take 120 mLs by mouth 3 (three) times daily.   ondansetron 4 MG tablet Commonly known as:  ZOFRAN Take 4 mg by mouth every 8 (eight) hours as needed for nausea or vomiting. Maximum 3 tablets in 25 hours   OXYGEN Inhale 3 L into the lungs daily.   polyethylene glycol packet Commonly known as:  MIRALAX / GLYCOLAX Take 17 g by mouth daily as needed. Mix in 8 oz liquid and drink   ranitidine  150 MG tablet Commonly known as:  ZANTAC Take 150 mg by mouth at bedtime.   tamsulosin 0.4 MG Caps capsule Commonly known as:  FLOMAX Take 0.4 mg by mouth every evening. 5pm   warfarin 2.5 MG tablet Commonly known as:  COUMADIN Take 2.5 mg by mouth one time only at 6 PM.       Immunizations: Immunization History  Administered Date(s) Administered  . PPD Test 06/10/2016     Physical Exam:  Vitals:   06/13/16 1439  BP: 117/62  Pulse: 76  Resp: 20  Temp: 98.5 F (36.9 C)  TempSrc: Oral  SpO2: 98%  Weight: 139 lb 3.2 oz (63.1 kg)  Height: 5\' 8"  (1.727 m)   Body mass index is 21.17 kg/m.  General- elderly frail male, in no acute distress Head- normocephalic, atraumatic Throat- moist mucus membrane Eyes- PERRLA, EOMI, no pallor,  no icterus, no discharge, normal conjunctiva, normal sclera Neck- no cervical lymphadenopathy Cardiovascular- normal s1,s2, no murmur Respiratory- bilateral clear to auscultation, no wheeze, no rhonchi, no crackles, no use of accessory muscles Abdomen- bowel sounds present, soft, non tender, Foley catheter in place Musculoskeletal- able to move all 4 extremities, limited right hip range of motion, no leg edema, kyphosis present, arthritis changes to his fingers Neurological- alert and oriented to person and place but not to time Skin- warm and dry Psychiatry- normal mood and affect    Labs reviewed: Basic Metabolic Panel:  Recent Labs  06/09/16 0503 06/09/16 1048 06/09/16 1633  NA 145 140 139  K 4.7 4.5 4.3  CL 111 106 105  CO2 23 29 27   GLUCOSE 120* 119* 137*  BUN 46* 42* 42*  CREATININE 1.65* 1.57* 1.54*  CALCIUM 9.2 9.0 8.8*   Liver Function Tests: No results for input(s): AST, ALT, ALKPHOS, BILITOT, PROT, ALBUMIN in the last 8760 hours. No results for input(s): LIPASE, AMYLASE in the last 8760 hours. No results for input(s): AMMONIA in the last 8760 hours. CBC:  Recent Labs  06/04/16 1748 06/05/16 0547   06/07/16 0438 06/08/16 0323 06/08/16 1512 06/09/16 0503  WBC 5.9 5.1  < > 9.8 8.9  --  7.0  NEUTROABS 4.4 3.6  --   --   --   --   --   HGB 9.4* 8.8*  < > 7.6* 7.7* 7.6* 8.9*  HCT 28.7* 26.9*  < > 23.0* 23.3* 23.1* 27.2*  MCV 90.8 92.4  < > 91.3 91.4  --  91.3  PLT 197 178  < > 142* 141*  --  156  < > = values in this interval not displayed. Cardiac Enzymes: No results for input(s): CKTOTAL, CKMB, CKMBINDEX, TROPONINI in the last 8760 hours. BNP: Invalid input(s): POCBNP CBG: No results for input(s): GLUCAP in the last 8760 hours.  Radiological Exams: Dg Chest 1 View  Result Date: 06/04/2016 CLINICAL DATA:  Fall, right hip pain, right femur fracture, initial encounter. EXAM: CHEST 1 VIEW COMPARISON:  06/20/2014. FINDINGS: Trachea is midline. Heart is enlarged. Devitalized wires overlie the heart. Mild patchy opacification in the lateral right midlung zone is new. Calcified granuloma in the left midlung zone. Lungs are otherwise clear. No pleural fluid. IMPRESSION: Mild patchy opacification in the lateral right midlung zone may be due to contusion and/or rib fractures. Electronically Signed   By: Lorin Picket M.D.   On: 06/04/2016 19:06   Ct Head Wo Contrast  Result Date: 06/04/2016 CLINICAL DATA:  Patient status post fall. No reported loss of consciousness. Head neck pain. EXAM: CT HEAD WITHOUT CONTRAST CT CERVICAL SPINE WITHOUT CONTRAST TECHNIQUE: Multidetector CT imaging of the head and cervical spine was performed following the standard protocol without intravenous contrast. Multiplanar CT image reconstructions of the cervical spine were also generated. COMPARISON:  Brain CT 03/21/2016. FINDINGS: CT HEAD FINDINGS Brain: Ventricles and sulci are prominent compatible with atrophy. Periventricular and subcortical white matter hypodensity compatible with chronic microvascular ischemic changes. Additionally encephalomalacia is demonstrated throughout the right cerebral hemisphere  compatible with remote infarct. Old right cerebellar hemisphere infarct. No evidence for acute cortically based infarct, intracranial hemorrhage, mass lesion or mass-effect. Old left basal ganglia lacunar infarct. Vascular: Internal carotid arterial calcifications Skull: Intact. Sinuses/Orbits: Paranasal sinuses are well aerated. Mastoid air cells are unremarkable. Other: None. CT CERVICAL SPINE FINDINGS Alignment: Normal anatomic alignment. Skull base and vertebrae: Irregular lucency through the right aspect of the C1 ring (  image 17; series 7). Soft tissues and spinal canal: Unremarkable. Disc levels: Multilevel degenerative disc disease most pronounced C3-4, C4-5 and C5-6. No acute cervical spine fracture. Upper chest: No consolidative pulmonary opacities. There is a 1.9 cm nodule within the thyroid (image 85; series 6). Other: None. IMPRESSION: Irregular lucency through the posterior right aspect of the C1 ring likely sequelae of remote trauma or potentially secondary to a lytic lesion in the setting of metastatic disease, felt to be less likely. No acute intracranial process. No definite acute cervical spine fracture. Cortical atrophy and old cortically based infarcts. Electronically Signed   By: Lovey Newcomer M.D.   On: 06/04/2016 18:53   Ct Cervical Spine Wo Contrast  Result Date: 06/04/2016 CLINICAL DATA:  Patient status post fall. No reported loss of consciousness. Head neck pain. EXAM: CT HEAD WITHOUT CONTRAST CT CERVICAL SPINE WITHOUT CONTRAST TECHNIQUE: Multidetector CT imaging of the head and cervical spine was performed following the standard protocol without intravenous contrast. Multiplanar CT image reconstructions of the cervical spine were also generated. COMPARISON:  Brain CT 03/21/2016. FINDINGS: CT HEAD FINDINGS Brain: Ventricles and sulci are prominent compatible with atrophy. Periventricular and subcortical white matter hypodensity compatible with chronic microvascular ischemic changes.  Additionally encephalomalacia is demonstrated throughout the right cerebral hemisphere compatible with remote infarct. Old right cerebellar hemisphere infarct. No evidence for acute cortically based infarct, intracranial hemorrhage, mass lesion or mass-effect. Old left basal ganglia lacunar infarct. Vascular: Internal carotid arterial calcifications Skull: Intact. Sinuses/Orbits: Paranasal sinuses are well aerated. Mastoid air cells are unremarkable. Other: None. CT CERVICAL SPINE FINDINGS Alignment: Normal anatomic alignment. Skull base and vertebrae: Irregular lucency through the right aspect of the C1 ring (image 17; series 7). Soft tissues and spinal canal: Unremarkable. Disc levels: Multilevel degenerative disc disease most pronounced C3-4, C4-5 and C5-6. No acute cervical spine fracture. Upper chest: No consolidative pulmonary opacities. There is a 1.9 cm nodule within the thyroid (image 85; series 6). Other: None. IMPRESSION: Irregular lucency through the posterior right aspect of the C1 ring likely sequelae of remote trauma or potentially secondary to a lytic lesion in the setting of metastatic disease, felt to be less likely. No acute intracranial process. No definite acute cervical spine fracture. Cortical atrophy and old cortically based infarcts. Electronically Signed   By: Lovey Newcomer M.D.   On: 06/04/2016 18:53   Dg Chest Port 1 View  Result Date: 06/09/2016 CLINICAL DATA:  Cough. EXAM: PORTABLE CHEST 1 VIEW COMPARISON:  06/04/2016 FINDINGS: The patient is mildly rotated to the left. Cardiac silhouette remains enlarged. Sequelae of prior CABG are again identified. Abandoned wires overlie the heart. Calcified granuloma in the left mid lung is unchanged. There is mild pulmonary vascular congestion with increasing interstitial densities bilaterally. Mild patchy opacity is again seen in the right mid lung. No sizable pleural effusion or pneumothorax is identified. Degenerative changes are noted about  the right shoulder. IMPRESSION: Cardiomegaly with increasing interstitial densities which may reflect mild edema. Electronically Signed   By: Logan Bores M.D.   On: 06/09/2016 13:14   Dg C-arm 1-60 Min  Result Date: 06/05/2016 CLINICAL DATA:  Right femoral intra medullary nail. EXAM: RIGHT FEMUR 2 VIEWS; DG C-ARM 61-120 MIN COMPARISON:  Right hip 06/04/2016 FINDINGS: Intraoperative fluoroscopy is obtained for surgical control purposes. Fluoroscopy time is recorded at 66 seconds. 9 spot fluoroscopic images are obtained. Spot fluoroscopic images obtained demonstrate internal reduction of comminuted inter trochanteric fractures of the right hip and fixation with long femoral  intra medullary rod and compression bolt. Fracture fragments appear to be in near anatomic alignment and position. Surgical hardware appears intact. Vascular calcifications in the soft tissues. IMPRESSION: Intraoperative fluoroscopy for surgical control purposes demonstrating internal fixation of inter trochanteric right proximal femoral fractures using intra medullary rod and compression screw. Electronically Signed   By: Lucienne Capers M.D.   On: 06/05/2016 22:43   Dg Hip Unilat With Pelvis 2-3 Views Right  Result Date: 06/04/2016 CLINICAL DATA:  Fall onto right side, right hip pain, initial encounter. EXAM: DG HIP (WITH OR WITHOUT PELVIS) 2-3V RIGHT COMPARISON:  The the FINDINGS: There is a comminuted intertrochanteric fracture of the right femur with varus angulation. No dislocation. Left hip arthroplasty. Surgical clips in the anatomic pelvis. IMPRESSION: Intertrochanteric right femur fracture. Electronically Signed   By: Lorin Picket M.D.   On: 06/04/2016 19:04   Dg Femur, Min 2 Views Right  Result Date: 06/05/2016 CLINICAL DATA:  Right femoral intra medullary nail. EXAM: RIGHT FEMUR 2 VIEWS; DG C-ARM 61-120 MIN COMPARISON:  Right hip 06/04/2016 FINDINGS: Intraoperative fluoroscopy is obtained for surgical control  purposes. Fluoroscopy time is recorded at 66 seconds. 9 spot fluoroscopic images are obtained. Spot fluoroscopic images obtained demonstrate internal reduction of comminuted inter trochanteric fractures of the right hip and fixation with long femoral intra medullary rod and compression bolt. Fracture fragments appear to be in near anatomic alignment and position. Surgical hardware appears intact. Vascular calcifications in the soft tissues. IMPRESSION: Intraoperative fluoroscopy for surgical control purposes demonstrating internal fixation of inter trochanteric right proximal femoral fractures using intra medullary rod and compression screw. Electronically Signed   By: Lucienne Capers M.D.   On: 06/05/2016 22:43    Assessment/Plan  Generalized weakness Post recent fall with right hip fracture. Here for rehabilitation. Will have him work with physical therapy and occupational therapy to help regain his strength and balance. Fall precautions to be taken.  Right hip fracture Status post IM nail. Will have him work with physical therapy and occupational therapy team. Will need to strengthen his gait and balance. Will need follow-up with orthopedics. Continue Tylenol thousand milligrams every 8 hours as needed for pain and Norco 5-3 25 mg 1 tablet every 6 hours as needed for pain. Continue Coumadin for DVT prophylaxis.  Blood loss anemia Postoperative, monitor CBC. Status post blood transfusion in the hospital  Alzheimer's dementia Provide supportive care. Continue Aricept daily. Fall precautions to be taken. Pressure ulcer prophylaxis to be provided. Provide assistance with feeding.  Hypertension Monitor blood pressure reading. Continue amlodipine and BiDil current regimen  Atrial fibrillation Rate controlled. Continue Coumadin for anticoagulation. Monitor INR.  BPH Continue Flomax for now. Has Foley catheter in place. Monitor. Next  GERD Continue ranitidine  CKD stage III Monitor  BMP  Chronic depression Continue his home regimen Cymbalta. Monitor   Goals of care: short term rehabilitation   Labs/tests ordered:  Family/ staff Communication: reviewed care plan with patient and nursing supervisor    Blanchie Serve, MD Internal Medicine [place], [place] [address] Cell Phone (Monday-Friday 8 am - 5 pm): [phone] On Call: [phone] and follow prompts after 5 pm and on weekends Office Phone: [phone] Office Fax: [phone]

## 2016-06-20 DIAGNOSIS — M79604 Pain in right leg: Secondary | ICD-10-CM | POA: Diagnosis not present

## 2016-06-20 DIAGNOSIS — R2681 Unsteadiness on feet: Secondary | ICD-10-CM | POA: Diagnosis not present

## 2016-06-20 DIAGNOSIS — L89150 Pressure ulcer of sacral region, unstageable: Secondary | ICD-10-CM | POA: Diagnosis not present

## 2016-06-20 DIAGNOSIS — M80051D Age-related osteoporosis with current pathological fracture, right femur, subsequent encounter for fracture with routine healing: Secondary | ICD-10-CM | POA: Diagnosis not present

## 2016-06-20 DIAGNOSIS — M6281 Muscle weakness (generalized): Secondary | ICD-10-CM | POA: Diagnosis not present

## 2016-06-20 DIAGNOSIS — Z5189 Encounter for other specified aftercare: Secondary | ICD-10-CM | POA: Diagnosis not present

## 2016-06-21 DIAGNOSIS — Z5189 Encounter for other specified aftercare: Secondary | ICD-10-CM | POA: Diagnosis not present

## 2016-06-21 DIAGNOSIS — M79604 Pain in right leg: Secondary | ICD-10-CM | POA: Diagnosis not present

## 2016-06-21 DIAGNOSIS — M6281 Muscle weakness (generalized): Secondary | ICD-10-CM | POA: Diagnosis not present

## 2016-06-21 DIAGNOSIS — L89150 Pressure ulcer of sacral region, unstageable: Secondary | ICD-10-CM | POA: Diagnosis not present

## 2016-06-21 DIAGNOSIS — R2681 Unsteadiness on feet: Secondary | ICD-10-CM | POA: Diagnosis not present

## 2016-06-21 DIAGNOSIS — M80051D Age-related osteoporosis with current pathological fracture, right femur, subsequent encounter for fracture with routine healing: Secondary | ICD-10-CM | POA: Diagnosis not present

## 2016-06-22 ENCOUNTER — Ambulatory Visit (INDEPENDENT_AMBULATORY_CARE_PROVIDER_SITE_OTHER): Payer: Medicare Other | Admitting: Orthopaedic Surgery

## 2016-06-22 ENCOUNTER — Ambulatory Visit (INDEPENDENT_AMBULATORY_CARE_PROVIDER_SITE_OTHER): Payer: No Typology Code available for payment source

## 2016-06-22 DIAGNOSIS — S72001A Fracture of unspecified part of neck of right femur, initial encounter for closed fracture: Secondary | ICD-10-CM

## 2016-06-22 DIAGNOSIS — M25551 Pain in right hip: Secondary | ICD-10-CM | POA: Diagnosis not present

## 2016-06-22 DIAGNOSIS — M79604 Pain in right leg: Secondary | ICD-10-CM | POA: Diagnosis not present

## 2016-06-22 DIAGNOSIS — L89150 Pressure ulcer of sacral region, unstageable: Secondary | ICD-10-CM | POA: Diagnosis not present

## 2016-06-22 DIAGNOSIS — M6281 Muscle weakness (generalized): Secondary | ICD-10-CM | POA: Diagnosis not present

## 2016-06-22 DIAGNOSIS — Z5189 Encounter for other specified aftercare: Secondary | ICD-10-CM | POA: Diagnosis not present

## 2016-06-22 DIAGNOSIS — M80051D Age-related osteoporosis with current pathological fracture, right femur, subsequent encounter for fracture with routine healing: Secondary | ICD-10-CM | POA: Diagnosis not present

## 2016-06-22 DIAGNOSIS — R2681 Unsteadiness on feet: Secondary | ICD-10-CM | POA: Diagnosis not present

## 2016-06-22 LAB — BASIC METABOLIC PANEL
BUN: 37 mg/dL — AB (ref 4–21)
CREATININE: 1.6 mg/dL — AB (ref 0.6–1.3)
GLUCOSE: 96 mg/dL
POTASSIUM: 4.2 mmol/L (ref 3.4–5.3)
Sodium: 144 mmol/L (ref 137–147)

## 2016-06-22 NOTE — Progress Notes (Signed)
Asian is a elderly 82 year old gentleman who we saw 17 days ago with an acute right hip intertrochanteric fracture. He was taken to the operating room for open reduction internal fixation of this fracture with intramedullary rod and hip screw construct. He's convalescing in a nursing facility. His weightbearing has been limited just due to him being such a fall risk. He gets into a wheelchair mainly. He says he is doing well.  On examination his stable show some skin irritation so they've been removed and Steri-Strips applied. There is no evidence infection. He tolerates me putting his hip through range of motion gently. X-rays of his pelvis and left hip as well as right hip show an intact hemiarthroplasty on the left from an old trauma. His rod and hip screw on the right side are in good position on AP view. On lateral view I do feel the hip screw is in a slight anterior position which could lead to cut-out. Otherwise see no, getting features and is a minimal minimal ambulator.  At this standpoint he'll mainly just continue minimal limited weightbearing with transfers to wheelchair. We'll see him back in 4 weeks with a repeat AP and lateral of his right hip only.

## 2016-06-26 DIAGNOSIS — Z5189 Encounter for other specified aftercare: Secondary | ICD-10-CM | POA: Diagnosis not present

## 2016-06-26 DIAGNOSIS — M80051D Age-related osteoporosis with current pathological fracture, right femur, subsequent encounter for fracture with routine healing: Secondary | ICD-10-CM | POA: Diagnosis not present

## 2016-06-26 DIAGNOSIS — M79604 Pain in right leg: Secondary | ICD-10-CM | POA: Diagnosis not present

## 2016-06-26 DIAGNOSIS — R2681 Unsteadiness on feet: Secondary | ICD-10-CM | POA: Diagnosis not present

## 2016-06-26 DIAGNOSIS — L89150 Pressure ulcer of sacral region, unstageable: Secondary | ICD-10-CM | POA: Diagnosis not present

## 2016-06-26 DIAGNOSIS — M6281 Muscle weakness (generalized): Secondary | ICD-10-CM | POA: Diagnosis not present

## 2016-06-27 DIAGNOSIS — Z5189 Encounter for other specified aftercare: Secondary | ICD-10-CM | POA: Diagnosis not present

## 2016-06-27 DIAGNOSIS — M79604 Pain in right leg: Secondary | ICD-10-CM | POA: Diagnosis not present

## 2016-06-27 DIAGNOSIS — R2681 Unsteadiness on feet: Secondary | ICD-10-CM | POA: Diagnosis not present

## 2016-06-27 DIAGNOSIS — M80051D Age-related osteoporosis with current pathological fracture, right femur, subsequent encounter for fracture with routine healing: Secondary | ICD-10-CM | POA: Diagnosis not present

## 2016-06-27 DIAGNOSIS — L89150 Pressure ulcer of sacral region, unstageable: Secondary | ICD-10-CM | POA: Diagnosis not present

## 2016-06-27 DIAGNOSIS — M6281 Muscle weakness (generalized): Secondary | ICD-10-CM | POA: Diagnosis not present

## 2016-06-29 ENCOUNTER — Other Ambulatory Visit (HOSPITAL_COMMUNITY): Payer: Self-pay | Admitting: Internal Medicine

## 2016-06-29 DIAGNOSIS — R1319 Other dysphagia: Secondary | ICD-10-CM

## 2016-06-30 ENCOUNTER — Ambulatory Visit (HOSPITAL_COMMUNITY)
Admission: RE | Admit: 2016-06-30 | Discharge: 2016-06-30 | Disposition: A | Discharge status: Home / Self Care | Payer: No Typology Code available for payment source | Source: Ambulatory Visit | Attending: Internal Medicine | Admitting: Internal Medicine

## 2016-06-30 DIAGNOSIS — F0280 Dementia in other diseases classified elsewhere without behavioral disturbance: Secondary | ICD-10-CM | POA: Diagnosis not present

## 2016-06-30 DIAGNOSIS — Z8546 Personal history of malignant neoplasm of prostate: Secondary | ICD-10-CM | POA: Insufficient documentation

## 2016-06-30 DIAGNOSIS — Z8673 Personal history of transient ischemic attack (TIA), and cerebral infarction without residual deficits: Secondary | ICD-10-CM | POA: Insufficient documentation

## 2016-06-30 DIAGNOSIS — I2510 Atherosclerotic heart disease of native coronary artery without angina pectoris: Secondary | ICD-10-CM | POA: Diagnosis not present

## 2016-06-30 DIAGNOSIS — G309 Alzheimer's disease, unspecified: Secondary | ICD-10-CM | POA: Diagnosis not present

## 2016-06-30 DIAGNOSIS — I482 Chronic atrial fibrillation: Secondary | ICD-10-CM | POA: Diagnosis not present

## 2016-06-30 DIAGNOSIS — R1310 Dysphagia, unspecified: Secondary | ICD-10-CM | POA: Insufficient documentation

## 2016-06-30 DIAGNOSIS — R1319 Other dysphagia: Secondary | ICD-10-CM | POA: Diagnosis not present

## 2016-06-30 DIAGNOSIS — I4891 Unspecified atrial fibrillation: Secondary | ICD-10-CM | POA: Insufficient documentation

## 2016-06-30 DIAGNOSIS — R634 Abnormal weight loss: Secondary | ICD-10-CM | POA: Diagnosis not present

## 2016-07-06 ENCOUNTER — Non-Acute Institutional Stay (SKILLED_NURSING_FACILITY): Payer: Medicare Other | Admitting: Internal Medicine

## 2016-07-06 ENCOUNTER — Encounter: Payer: Self-pay | Admitting: Internal Medicine

## 2016-07-06 DIAGNOSIS — F0280 Dementia in other diseases classified elsewhere without behavioral disturbance: Secondary | ICD-10-CM | POA: Diagnosis not present

## 2016-07-06 DIAGNOSIS — R634 Abnormal weight loss: Secondary | ICD-10-CM

## 2016-07-06 DIAGNOSIS — E43 Unspecified severe protein-calorie malnutrition: Secondary | ICD-10-CM | POA: Diagnosis not present

## 2016-07-06 DIAGNOSIS — G301 Alzheimer's disease with late onset: Secondary | ICD-10-CM | POA: Diagnosis not present

## 2016-07-06 NOTE — Progress Notes (Signed)
LOCATION: [place]  PCP: Melinda Crutch, MD   Code Status: Full Code  Goals of care: Advanced Directive information Advanced Directives 09/04/2014  Does Patient Have a Medical Advance Directive? Yes  Type of Advance Directive [place] in Chart? Yes  Would patient like information on creating a medical advance directive? -       Extended Emergency Contact Information Primary Emergency Contact: [facility] Address: [address] [place], [place] [address] [place] Mobile Phone: [phone] Relation: Daughter Secondary Emergency Contact: Marcum,Daniel Address: [address] [place], [place] [address] [place] Phone: [phone] Relation: Son   No Known Allergies  Chief Complaint  Patient presents with  . Acute Visit    Weight loss      HPI:  Patient is a 82 y.o. male seen today for acute visit for weight loss. Per RD there has been concerning weight loss. On chart review, he has had significant weight loss over last few weeks with ongoing weight loss. His appetite has been poor per nursing with him eating lest than 255 of his meals. He has advanced dementia and COPD. He is currently on nutritional supplements.   Review of Systems:  Constitutional: Negative for fever, chills.  HENT: Negative for headache, congestion, nasal discharge. Has hearing loss.  Respiratory: Negative for cough, shortness of breath  Cardiovascular: Negative for chest pain, palpitation  Gastrointestinal: Negative for heartburn, nausea, vomiting, abdominal pain.  Genitourinary: Negative for dysuria Musculoskeletal: Negative for fall in the facility.  Skin: Negative for itching, rash.  Neurological: Negative for dizziness. Psychiatric/Behavioral: Negative for depression   Past Medical History:  Diagnosis Date  . Alzheimer's disease, focal onset   . Atrial fibrillation ([place])     . CAD (coronary artery disease)   . Chronic atrial fibrillation ([place])   . High blood pressure   . Low back pain   . Memory loss   . Prostate cancer ([place])   . Recurrent major depression ([place])   . Stroke [facility])    Past Surgical History:  Procedure Laterality Date  . CATARACT EXTRACTION    . CORONARY ARTERY BYPASS GRAFT    . FEMUR IM NAIL Right 06/05/2016   Procedure: INTRAMEDULLARY (IM) NAIL FEMORAL;  Surgeon: Mcarthur Rossetti, MD;  Location: [facility];  Service: Orthopedics;  Laterality: Right;  . HIP ARTHROPLASTY Left 06/22/2014   Procedure: LEFT HEMIARTHROPLASTY HIP;  Surgeon: Meredith Pel, MD;  Location: [facility];  Service: Orthopedics;  Laterality: Left;  . PROSTATECTOMY     Social History:   reports that he quit smoking about 60 years ago. His smoking use included Cigarettes. He has a 1.00 pack-year smoking history. He has never used smokeless tobacco. He reports that he does not drink alcohol or use drugs.  Family History  Problem Relation Age of Onset  . Cancer Father   . High blood pressure      Medications: Allergies as of 07/06/2016   No Known Allergies     Medication List       Accurate as of 07/06/16  3:16 PM. Always use your most recent med list.          acetaminophen 500 MG tablet Commonly known as:  TYLENOL Take 1,000 mg by mouth every 8 (eight) hours as needed (pain). Maximum 3  tablets in 24 hours   albuterol (2.5 MG/3ML) 0.083% nebulizer solution Commonly known as:  PROVENTIL Take 2.5 mg by nebulization every 6 (six) hours as needed for wheezing.   amLODipine 10 MG tablet Commonly known as:  NORVASC Take 1 tablet (10 mg total) by mouth daily.   brimonidine-timolol 0.2-0.5 % ophthalmic solution Commonly known as:  COMBIGAN Place 1 drop into both eyes every 12 (twelve) hours.   carvedilol 12.5 MG tablet Commonly known as:  COREG Take 12.5 mg by mouth 2 (two) times daily with a meal.   CENTRATEX 106-1 MG Caps Take 1 capsule by mouth  daily.   diphenhydrAMINE 12.5 MG/5ML syrup Commonly known as:  BENYLIN Take 12.5 mg by mouth 3 (three) times daily as needed for itching.   docusate sodium 100 MG capsule Commonly known as:  COLACE Take 1 capsule (100 mg total) by mouth daily as needed for mild constipation.   donepezil 10 MG tablet Commonly known as:  ARICEPT Take 10 mg by mouth at bedtime.   DULoxetine 30 MG capsule Commonly known as:  CYMBALTA Take 30 mg by mouth daily.   feeding supplement (PRO-STAT SUGAR FREE 64) Liqd Take 30 mLs by mouth 2 (two) times daily.   fluticasone 50 MCG/ACT nasal spray Commonly known as:  FLONASE Place 2 sprays into both nostrils at bedtime.   HYDROcodone-acetaminophen 5-325 MG tablet Commonly known as:  NORCO/VICODIN Take 1 tablet by mouth every 6 (six) hours as needed for severe pain.   isosorbide-hydrALAZINE 20-37.5 MG tablet Commonly known as:  BIDIL Take 1 tablet by mouth 2 (two) times daily.   latanoprost 0.005 % ophthalmic solution Commonly known as:  XALATAN Place 1 drop into both eyes at bedtime.   loperamide 2 MG capsule Commonly known as:  IMODIUM Take 2 mg by mouth every 6 (six) hours as needed for diarrhea or loose stools.   MYRBETRIQ 25 MG Tb24 tablet Generic drug:  mirabegron ER Take 25 mg by mouth daily.   NUTRITIONAL SUPPLEMENT Liqd Take 120 mLs by mouth 3 (three) times daily.   ondansetron 4 MG tablet Commonly known as:  ZOFRAN Take 4 mg by mouth every 8 (eight) hours as needed for nausea or vomiting. Maximum 3 tablets in 25 hours   polyethylene glycol packet Commonly known as:  MIRALAX / GLYCOLAX Take 17 g by mouth daily as needed. Mix in 8 oz liquid and drink   ranitidine 150 MG tablet Commonly known as:  ZANTAC Take 150 mg by mouth at bedtime.   tamsulosin 0.4 MG Caps capsule Commonly known as:  FLOMAX Take 0.4 mg by mouth every evening. 5pm   warfarin 2.5 MG tablet Commonly known as:  COUMADIN Take 2.5 mg by mouth one time only at  6 PM.       Immunizations: Immunization History  Administered Date(s) Administered  . PPD Test 06/10/2016     Physical Exam:  Vitals:   07/06/16 1507  BP: (!) 141/83  Pulse: 80  Resp: 18  Temp: 98.6 F (37 C)  TempSrc: Oral  SpO2: 95%  Weight: 121 lb 6.4 oz (55.1 kg)  Height: 5\' 8"  (1.727 m)   Body mass index is 18.46 kg/m.  General- elderly frail male, in no acute distress, muscle wasting with atrophy noted Head- normocephalic, atraumatic Throat- moist mucus membrane Eyes- PERRLA, EOMI, no pallor, no icterus, no discharge, normal conjunctiva, normal sclera Neck- no cervical lymphadenopathy Cardiovascular- normal s1,s2, no murmur Respiratory- bilateral clear to auscultation, no wheeze, no  rhonchi, no crackles, no use of accessory muscles Abdomen- bowel sounds present, soft, non tender Musculoskeletal- able to move all 4 extremities, on wheelchair  Neurological- alert and oriented to person only Skin- warm and dry Psychiatry- normal mood and affect    Labs reviewed: Basic Metabolic Panel:  Recent Labs  06/09/16 0503 06/09/16 1048 06/09/16 1633 06/13/16 06/22/16  NA 145 140 139 142 144  K 4.7 4.5 4.3 4.2 4.2  CL 111 106 105  --   --   CO2 23 29 27   --   --   GLUCOSE 120* 119* 137*  --   --   BUN 46* 42* 42* 36* 37*  CREATININE 1.65* 1.57* 1.54* 1.4* 1.6*  CALCIUM 9.2 9.0 8.8*  --   --    CBC:  Recent Labs  06/04/16 1748 06/05/16 0547  06/07/16 0438 06/08/16 0323 06/08/16 1512 06/09/16 0503 06/13/16  WBC 5.9 5.1  < > 9.8 8.9  --  7.0 6.1  NEUTROABS 4.4 3.6  --   --   --   --   --   --   HGB 9.4* 8.8*  < > 7.6* 7.7* 7.6* 8.9* 9.6*  HCT 28.7* 26.9*  < > 23.0* 23.3* 23.1* 27.2* 30*  MCV 90.8 92.4  < > 91.3 91.4  --  91.3  --   PLT 197 178  < > 142* 141*  --  156 230  < > = values in this interval not displayed.   Radiological Exams: Dg Op Swallowing Func-medicare/speech Path  Result Date: 06/30/2016 Objective Swallowing Evaluation: Type of  Study: MBS-Modified Barium Swallow Study Patient Details Name: Cortlandt Kosky MRN: [id number] Date of Birth: 1919-04-17 Today's Date: 06/30/2016 Time: SLP Start Time (ACUTE ONLY): 1216-SLP Stop Time (ACUTE ONLY): 1254 SLP Time Calculation (min) (ACUTE ONLY): 38 min Past Medical History: Past Medical History: Diagnosis Date . Alzheimer's disease, focal onset  . Atrial fibrillation ([place])  . CAD (coronary artery disease)  . Chronic atrial fibrillation ([place])  . High blood pressure  . Low back pain  . Memory loss  . Prostate cancer ([place])  . Recurrent major depression ([place])  . Stroke [facility])  Past Surgical History: Past Surgical History: Procedure Laterality Date . CATARACT EXTRACTION   . CORONARY ARTERY BYPASS GRAFT   . FEMUR IM NAIL Right 06/05/2016  Procedure: INTRAMEDULLARY (IM) NAIL FEMORAL;  Surgeon: Mcarthur Rossetti, MD;  Location: [facility];  Service: Orthopedics;  Laterality: Right; . HIP ARTHROPLASTY Left 06/22/2014  Procedure: LEFT HEMIARTHROPLASTY HIP;  Surgeon: Meredith Pel, MD;  [facility];  Service: Orthopedics;  Laterality: Left; . PROSTATECTOMY   HPI: Pt is a 82 year old male arriving for an outpatient MBS after recent hospitalization for hip fx. pt has a history of esophageal dysphagia and mild oropahryngeal dysphagia. MBS in 2016 (recommended after pt sensed aspriation on esophagram) showed premature spillage, delayed swallow no penetration or aspiration.  No Data Recorded Assessment / Plan / Recommendation CHL IP CLINICAL IMPRESSIONS 06/30/2016 Therapy Diagnosis Suspected primary esophageal dysphagia;Moderate oral phase dysphagia;Moderate pharyngeal phase dysphagia Clinical Impression Pt demonstrates a moderate oral dysphagia with lingual pumping of boluses and significant premature spillage as well as oral residual. This impacts pharyngeal swallow as premature spillage pools in pyriforms and is aspirated before/during the swallow as larynx elevates. Oral residuals also spill to the valleculae post  swallow and contribute to mild nectar penetrate. A throat clear/cough with a second swallow aids in clearance of nectar penetrate and residue. Overall, strength  of pharyngeal swallow is adequate, but timing and head/trunk support are very poor making sensed aspiration events probable during meals. Recommend a short term diet modification as pt rehabs from hip fx, though long term thickened liquids would not be recommended for a pt of advanced age with a significant esophageal phase dysphagia that is likely to aspirate reflux/backflow from esophagus as well. Recommend SLP at SNF/HH for diet management and use of cough/threat clear/second swallow as compensatory strategies.  Impact on safety and function Moderate aspiration risk   CHL IP TREATMENT RECOMMENDATION 06/30/2016 Treatment Recommendations Defer treatment plan to f/u with SLP   No flowsheet data found. CHL IP DIET RECOMMENDATION 06/30/2016 SLP Diet Recommendations Dysphagia 3 (Mech soft) solids;Nectar thick liquid Liquid Administration via Cup;Straw Medication Administration Crushed with puree Compensations Clear throat intermittently;Slow rate;Small sips/bites Postural Changes Seated upright at 90 degrees;Remain semi-upright after after feeds/meals (Comment)   No flowsheet data found.  CHL IP FOLLOW UP RECOMMENDATIONS 09/04/2014 Follow up Recommendations None   CHL IP FREQUENCY AND DURATION 09/07/2014 Speech Therapy Frequency (ACUTE ONLY) min 2x/week Treatment Duration 1 week      CHL IP ORAL PHASE 06/30/2016 Oral Phase Impaired Oral - Pudding Teaspoon -- Oral - Pudding Cup -- Oral - Honey Teaspoon -- Oral - Honey Cup -- Oral - Nectar Teaspoon Lingual pumping;Reduced posterior propulsion;Lingual/palatal residue;Delayed oral transit;Decreased bolus cohesion;Premature spillage Oral - Nectar Cup Lingual pumping;Reduced posterior propulsion;Lingual/palatal residue;Delayed oral transit;Decreased bolus cohesion;Premature spillage Oral - Nectar Straw Lingual  pumping;Reduced posterior propulsion;Lingual/palatal residue;Delayed oral transit;Decreased bolus cohesion;Premature spillage Oral - Thin Teaspoon Lingual pumping;Reduced posterior propulsion;Lingual/palatal residue;Delayed oral transit;Decreased bolus cohesion;Premature spillage Oral - Thin Cup Lingual pumping;Reduced posterior propulsion;Lingual/palatal residue;Delayed oral transit;Decreased bolus cohesion;Premature spillage Oral - Thin Straw Lingual pumping;Reduced posterior propulsion;Lingual/palatal residue;Delayed oral transit;Decreased bolus cohesion;Premature spillage Oral - Puree Lingual pumping;Reduced posterior propulsion;Lingual/palatal residue;Delayed oral transit;Decreased bolus cohesion Oral - Mech Soft -- Oral - Regular Lingual pumping;Reduced posterior propulsion;Lingual/palatal residue;Delayed oral transit;Decreased bolus cohesion;Premature spillage Oral - Multi-Consistency -- Oral - Pill -- Oral Phase - Comment --  CHL IP PHARYNGEAL PHASE 06/30/2016 Pharyngeal Phase Impaired Pharyngeal- Pudding Teaspoon -- Pharyngeal -- Pharyngeal- Pudding Cup -- Pharyngeal -- Pharyngeal- Honey Teaspoon -- Pharyngeal -- Pharyngeal- Honey Cup -- Pharyngeal -- Pharyngeal- Nectar Teaspoon Delayed swallow initiation-pyriform sinuses;Pharyngeal residue - valleculae;Penetration/Aspiration before swallow;Penetration/Aspiration during swallow Pharyngeal Material enters airway, remains ABOVE vocal cords and not ejected out Pharyngeal- Nectar Cup Delayed swallow initiation-pyriform sinuses;Pharyngeal residue - valleculae;Penetration/Aspiration before swallow;Penetration/Aspiration during swallow Pharyngeal Material enters airway, remains ABOVE vocal cords and not ejected out Pharyngeal- Nectar Straw Delayed swallow initiation-pyriform sinuses;Pharyngeal residue - valleculae;Penetration/Aspiration before swallow;Penetration/Aspiration during swallow Pharyngeal Material enters airway, remains ABOVE vocal cords and not ejected  out Pharyngeal- Thin Teaspoon Delayed swallow initiation-pyriform sinuses;Pharyngeal residue - valleculae;Penetration/Aspiration before swallow;Penetration/Aspiration during swallow;Trace aspiration Pharyngeal Material enters airway, passes BELOW cords without attempt by patient to eject out (silent aspiration) Pharyngeal- Thin Cup Delayed swallow initiation-pyriform sinuses;Pharyngeal residue - valleculae;Penetration/Aspiration before swallow;Penetration/Aspiration during swallow;Significant aspiration (Amount) Pharyngeal Material enters airway, passes BELOW cords and not ejected out despite cough attempt by patient Pharyngeal- Thin Straw Delayed swallow initiation-pyriform sinuses;Pharyngeal residue - valleculae;Penetration/Aspiration before swallow;Penetration/Aspiration during swallow;Trace aspiration Pharyngeal Material enters airway, passes BELOW cords and not ejected out despite cough attempt by patient Pharyngeal- Puree Delayed swallow initiation-vallecula;Pharyngeal residue - valleculae Pharyngeal -- Pharyngeal- Mechanical Soft -- Pharyngeal -- Pharyngeal- Regular Delayed swallow initiation-vallecula;Pharyngeal residue - valleculae Pharyngeal -- Pharyngeal- Multi-consistency -- Pharyngeal -- Pharyngeal- Pill -- Pharyngeal -- Pharyngeal Comment --  CHL IP CERVICAL ESOPHAGEAL PHASE  06/30/2016 Cervical Esophageal Phase Impaired Pudding Teaspoon -- Pudding Cup -- Honey Teaspoon -- Honey Cup -- Nectar Teaspoon -- Nectar Cup -- Nectar Straw -- Thin Teaspoon -- Thin Cup -- Thin Straw -- Puree -- Mechanical Soft -- Regular -- Multi-consistency -- Pill -- Cervical Esophageal Comment CP bar, dilated proximal esophagus CHL IP GO 06/30/2016 Functional Assessment Tool Used clinical judgement Functional Limitations Swallowing Swallow Current Status [id number]) CK Swallow Goal Status [id number]) CK Swallow Discharge Status [id number]) CK Motor Speech Current Status [id number]) (None) Motor Speech Goal Status [id number]) (None) Motor Speech  Goal Status [id number]) (None) Spoken Language Comprehension Current Status [id number]) (None) Spoken Language Comprehension Goal Status [id number]) (None) Spoken Language Comprehension Discharge Status [id number]) (None) Spoken Language Expression Current Status [id number]) (None) Spoken Language Expression Goal Status [id number]) (None) Spoken Language Expression Discharge Status [phone]) (None) Attention Current Status [id number]) (None) Attention Goal Status [id number]) (None) Attention Discharge Status [id number]) (None) Memory Current Status AE:130515) (None) Memory Goal Status GI:[id number]) (None) Memory Discharge Status [id number]) (None) Voice Current Status [id number]) (None) Voice Goal Status [id number]) (None) Voice Discharge Status [id number]) (None) Other Speech-Language Pathology Functional Limitation [phone]) (None) Other Speech-Language Pathology Functional Limitation Goal Status [id number]) (None) Other Speech-Language Pathology Functional Limitation Discharge Status [phone]) (None) DeBlois, Katherene Ponto 06/30/2016, 2:44 PM            CLINICAL DATA:  Dysphagia. EXAM: MODIFIED BARIUM SWALLOW TECHNIQUE: Different consistencies of barium were administered orally to the patient by the Speech Pathologist. Imaging of the pharynx was performed in the lateral projection. FLUOROSCOPY TIME:  Fluoroscopy Time:  3 minutes 42 second Radiation Exposure Index (if provided by the fluoroscopic device): Number of Acquired Spot Images: 0 COMPARISON:  09/06/2014 FINDINGS: Thin liquid- patient aspirated on the initial swallow. There was coughing. There was delayed swallowing trigger with retention. Additional swallows with tea spoon showed no aspiration. The patient aspirated with straw sips. Nectar thick liquid- delayed swallowing trigger with retention. No penetration Honey- not administered Pure- within normal limits Cracker-not administered Pure with cracker- not administered Barium tablet -  not administered IMPRESSION: Aspiration with thin barium. Delayed swallowing trigger  with retention. Swallowing function was improved with thicker consistencies. Please refer to the Speech Pathologists report for complete details and recommendations. Electronically Signed   By: Franchot Gallo M.D.   On: 06/30/2016 14:37   Dg Chest Port 1 View  Result Date: 06/09/2016 CLINICAL DATA:  Cough. EXAM: PORTABLE CHEST 1 VIEW COMPARISON:  06/04/2016 FINDINGS: The patient is mildly rotated to the left. Cardiac silhouette remains enlarged. Sequelae of prior CABG are again identified. Abandoned wires overlie the heart. Calcified granuloma in the left mid lung is unchanged. There is mild pulmonary vascular congestion with increasing interstitial densities bilaterally. Mild patchy opacity is again seen in the right mid lung. No sizable pleural effusion or pneumothorax is identified. Degenerative changes are noted about the right shoulder. IMPRESSION: Cardiomegaly with increasing interstitial densities which may reflect mild edema. Electronically Signed   By: Logan Bores M.D.   On: 06/09/2016 13:14   Xr Hip Unilat W Or W/o Pelvis 2-3 Views Right  Result Date: 06/22/2016 An AP pelvis and lateral of his right hip show an intact intramedullary hip screw and rod with a trochanteric fracture. There are no, getting features. There is no evidence of healing yet.   Assessment/Plan  Weight loss Ongoing significant weight loss. Reviewed his med list. Wean him off and discontinue his aricept as this can cause weight loss. Also add remeron 7.5 mg daily  x 1 week and then increase it to 15 mg daily to help stimulate appetite and mood and monitor. Weekly weight check. RD on board.   Severe protein calorie malnutrition Continue nutritional supplement. Add appetite stimulant as above and monitor. decline anticipated with his advanced dementia  Alzheimer's dementia Advanced, discontinue his aricept after weighing it's benefit vs risk at present. Provide supportive care.    Family/ staff Communication:  reviewed care plan with patient and nursing supervisor    Blanchie Serve, MD Internal Medicine [place], [place] [address] Cell Phone (Monday-Friday 8 am - 5 pm): [phone] On Call: [phone] and follow prompts after 5 pm and on weekends Office Phone: [phone] Office Fax: [phone]

## 2016-07-12 DIAGNOSIS — M6281 Muscle weakness (generalized): Secondary | ICD-10-CM | POA: Diagnosis not present

## 2016-07-12 DIAGNOSIS — L89150 Pressure ulcer of sacral region, unstageable: Secondary | ICD-10-CM | POA: Diagnosis not present

## 2016-07-12 DIAGNOSIS — Z5189 Encounter for other specified aftercare: Secondary | ICD-10-CM | POA: Diagnosis not present

## 2016-07-12 DIAGNOSIS — M80051D Age-related osteoporosis with current pathological fracture, right femur, subsequent encounter for fracture with routine healing: Secondary | ICD-10-CM | POA: Diagnosis not present

## 2016-07-12 DIAGNOSIS — M79604 Pain in right leg: Secondary | ICD-10-CM | POA: Diagnosis not present

## 2016-07-12 DIAGNOSIS — R2681 Unsteadiness on feet: Secondary | ICD-10-CM | POA: Diagnosis not present

## 2016-07-17 DIAGNOSIS — L89150 Pressure ulcer of sacral region, unstageable: Secondary | ICD-10-CM | POA: Diagnosis not present

## 2016-07-17 DIAGNOSIS — M6281 Muscle weakness (generalized): Secondary | ICD-10-CM | POA: Diagnosis not present

## 2016-07-17 DIAGNOSIS — M80051D Age-related osteoporosis with current pathological fracture, right femur, subsequent encounter for fracture with routine healing: Secondary | ICD-10-CM | POA: Diagnosis not present

## 2016-07-17 DIAGNOSIS — Z5189 Encounter for other specified aftercare: Secondary | ICD-10-CM | POA: Diagnosis not present

## 2016-07-17 DIAGNOSIS — R2681 Unsteadiness on feet: Secondary | ICD-10-CM | POA: Diagnosis not present

## 2016-07-17 DIAGNOSIS — M79604 Pain in right leg: Secondary | ICD-10-CM | POA: Diagnosis not present

## 2016-07-19 ENCOUNTER — Encounter: Payer: Self-pay | Admitting: Adult Health

## 2016-07-19 ENCOUNTER — Non-Acute Institutional Stay (SKILLED_NURSING_FACILITY): Payer: Medicare Other | Admitting: Adult Health

## 2016-07-19 DIAGNOSIS — G309 Alzheimer's disease, unspecified: Secondary | ICD-10-CM | POA: Diagnosis not present

## 2016-07-19 DIAGNOSIS — D62 Acute posthemorrhagic anemia: Secondary | ICD-10-CM | POA: Diagnosis not present

## 2016-07-19 DIAGNOSIS — N3281 Overactive bladder: Secondary | ICD-10-CM

## 2016-07-19 DIAGNOSIS — I10 Essential (primary) hypertension: Secondary | ICD-10-CM

## 2016-07-19 DIAGNOSIS — G301 Alzheimer's disease with late onset: Secondary | ICD-10-CM

## 2016-07-19 DIAGNOSIS — E43 Unspecified severe protein-calorie malnutrition: Secondary | ICD-10-CM

## 2016-07-19 DIAGNOSIS — F0280 Dementia in other diseases classified elsewhere without behavioral disturbance: Secondary | ICD-10-CM

## 2016-07-19 DIAGNOSIS — N183 Chronic kidney disease, stage 3 unspecified: Secondary | ICD-10-CM

## 2016-07-19 DIAGNOSIS — L89153 Pressure ulcer of sacral region, stage 3: Secondary | ICD-10-CM

## 2016-07-19 DIAGNOSIS — K219 Gastro-esophageal reflux disease without esophagitis: Secondary | ICD-10-CM

## 2016-07-19 DIAGNOSIS — S72001S Fracture of unspecified part of neck of right femur, sequela: Secondary | ICD-10-CM

## 2016-07-19 DIAGNOSIS — I4819 Other persistent atrial fibrillation: Secondary | ICD-10-CM

## 2016-07-19 DIAGNOSIS — H409 Unspecified glaucoma: Secondary | ICD-10-CM

## 2016-07-19 DIAGNOSIS — N400 Enlarged prostate without lower urinary tract symptoms: Secondary | ICD-10-CM

## 2016-07-19 DIAGNOSIS — I481 Persistent atrial fibrillation: Secondary | ICD-10-CM | POA: Diagnosis not present

## 2016-07-19 DIAGNOSIS — F329 Major depressive disorder, single episode, unspecified: Secondary | ICD-10-CM

## 2016-07-19 DIAGNOSIS — K5900 Constipation, unspecified: Secondary | ICD-10-CM

## 2016-07-19 LAB — BASIC METABOLIC PANEL
BUN: 39 mg/dL — AB (ref 4–21)
Creatinine: 1.8 mg/dL — AB (ref 0.6–1.3)
GLUCOSE: 86 mg/dL
Potassium: 3.7 mmol/L (ref 3.4–5.3)
Sodium: 145 mmol/L (ref 137–147)

## 2016-07-19 NOTE — Progress Notes (Signed)
DATE:    07/19/16   MRN:  [id number]  BIRTHDAY: 1919-02-17  Facility:  Nursing Home Location:  [place] Room Number: 1205-P  LEVEL OF CARE:  SNF (31)  Contact Information    Name Relation Home Work [place] Daughter   [phone]   Jaclyn Shaggy   [phone]   Judithann Sheen   [phone]       Code Status History    Date Active Date Inactive Code Status Order ID Comments User Context   06/04/2016 10:51 PM 06/09/2016  9:00 PM Full Code [id number]  Norval Morton, MD Inpatient   09/04/2014 12:15 AM 09/08/2014  7:03 PM Full Code [id number]  Ivor Costa, MD ED   06/22/2014  9:49 PM 06/24/2014  9:11 PM Full Code [id number]  Meredith Pel, MD Inpatient   06/20/2014 10:53 PM 06/22/2014  9:49 PM Full Code [id number]  Jani Gravel, MD ED       Chief Complaint  Patient presents with  . Medical Management of Chronic Issues    HISTORY OF PRESENT ILLNESS:  This is a 83 year old male who is being seen for a routine visit. OT was recently discontinued He has advanced dementia and Donepezil was recently discontinued. Remeron was recently increased due to poor appetite and depression. He was seen in his room and complained of constipation.  He has PMH of hypertension, CVA, chronic atrial fibrillation and Alzheimer's dementia. He has been admitted to [facility] on 06/09/16 from [facility] hospitalization 06/04/16 to 06/09/16. He had a fall and sustained a fractured right hip. Dr. Ninfa Linden suggested patient to be given vitamin K and did surgery on 11/27. He had postop anemia and had blood transfusion of 2 units packed RBC.   PAST MEDICAL HISTORY:  Past Medical History:  Diagnosis Date  . Alzheimer's disease, focal onset   . Atrial fibrillation ([place])   . CAD (coronary artery disease)   . Chronic atrial fibrillation ([place])   . High blood pressure   . Low back pain   . Memory loss   . Prostate cancer ([place])   .  Recurrent major depression ([place])   . Stroke [facility])      CURRENT MEDICATIONS: Reviewed  Patient's Medications  New Prescriptions   No medications on file  Previous Medications   ACETAMINOPHEN (TYLENOL) 500 MG TABLET    Take 1,000 mg by mouth every 8 (eight) hours as needed (pain). Maximum 3 tablets in 24 hours   ALBUTEROL (PROVENTIL) (2.5 MG/3ML) 0.083% NEBULIZER SOLUTION    Take 2.5 mg by nebulization every 6 (six) hours as needed for wheezing.    AMINO ACIDS-PROTEIN HYDROLYS (FEEDING SUPPLEMENT, PRO-STAT SUGAR FREE 64,) LIQD    Take 30 mLs by mouth 2 (two) times daily.   AMLODIPINE (NORVASC) 10 MG TABLET    Take 1 tablet (10 mg total) by mouth daily.   BRIMONIDINE-TIMOLOL (COMBIGAN) 0.2-0.5 % OPHTHALMIC SOLUTION    Place 1 drop into both eyes every 12 (twelve) hours.   CARVEDILOL (COREG) 12.5 MG TABLET    Take 12.5 mg by mouth 2 (two) times daily with a meal.    DIPHENHYDRAMINE (BENYLIN) 12.5 MG/5ML SYRUP    Take 12.5 mg by mouth 3 (three) times daily as needed for itching.   DOCUSATE SODIUM (COLACE) 100 MG CAPSULE    Take 1 capsule (100 mg total) by mouth daily as needed for mild constipation.   DULOXETINE (CYMBALTA) 30 MG CAPSULE  Take 30 mg by mouth daily.    FE FUM-FA-B CMP-C-ZN-MG-MN-CU (CENTRATEX) 106-1 MG CAPS    Take 1 capsule by mouth daily.   FLUTICASONE (FLONASE) 50 MCG/ACT NASAL SPRAY    Place 2 sprays into both nostrils at bedtime.   HYDROCODONE-ACETAMINOPHEN (NORCO/VICODIN) 5-325 MG TABLET    Take 1 tablet by mouth every 6 (six) hours as needed for severe pain.   ISOSORBIDE-HYDRALAZINE (BIDIL) 20-37.5 MG PER TABLET    Take 1 tablet by mouth 2 (two) times daily.   LATANOPROST (XALATAN) 0.005 % OPHTHALMIC SOLUTION    Place 1 drop into both eyes at bedtime.   LOPERAMIDE (IMODIUM) 2 MG CAPSULE    Take 2 mg by mouth every 6 (six) hours as needed for diarrhea or loose stools.   MIRABEGRON ER (MYRBETRIQ) 25 MG TB24 TABLET    Take 25 mg by mouth daily.    MIRTAZAPINE (REMERON) 15 MG  TABLET    Take 15 mg by mouth daily.   NUTRITIONAL SUPPLEMENT LIQD    Take 120 mLs by mouth 3 (three) times daily. [employer]   ONDANSETRON (ZOFRAN) 4 MG TABLET    Take 4 mg by mouth every 8 (eight) hours as needed for nausea or vomiting. Maximum 3 tablets in 25 hours   POLYETHYLENE GLYCOL (MIRALAX / GLYCOLAX) PACKET    Take 17 g by mouth daily as needed. Mix in 8 oz liquid and drink   RANITIDINE (ZANTAC) 150 MG TABLET    Take 150 mg by mouth at bedtime.    TAMSULOSIN (FLOMAX) 0.4 MG CAPS CAPSULE    Take 0.4 mg by mouth every evening. 5pm    WARFARIN (COUMADIN) 3 MG TABLET    Take 1.5 mg by mouth daily. Take 1/2 of a 3 mg tablet to = 1.5 mg  Modified Medications   No medications on file  Discontinued Medications   DONEPEZIL (ARICEPT) 10 MG TABLET    Take 10 mg by mouth at bedtime.   WARFARIN (COUMADIN) 2.5 MG TABLET    Take 2.5 mg by mouth one time only at 6 PM.     No Known Allergies   REVIEW OF SYSTEMS:  GENERAL: no change in appetite, no fatigue, no weight changes, no fever, chills or weakness EYES: Denies change in vision, dry eyes, eye pain, itching or discharge EARS: Denies change in hearing, ringing in ears, or earache NOSE: Denies nasal congestion or epistaxis MOUTH and THROAT: Denies oral discomfort, gingival pain or bleeding, pain from teeth or hoarseness   RESPIRATORY: no cough, SOB, DOE, wheezing, hemoptysis CARDIAC: no chest pain, edema or palpitations GI: no abdominal pain, constipation, heart burn, nausea or vomiting, +diarrhea,  GU: Denies dysuria, frequency, hematuria, incontinence, or discharge PSYCHIATRIC: Denies feeling of depression or anxiety. No report of hallucinations, insomnia, paranoia, or agitation   PHYSICAL EXAMINATION  GENERAL APPEARANCE: Well nourished. In no acute distress. Normal body habitus SKIN:  Right hip surgical incision is healed, sacrum has stage 3 pressure ulcer HEAD: Normal in size and contour. No evidence of trauma EYES: Lids open and  close normally. No blepharitis, entropion or ectropion. PERRL. Conjunctivae are clear and sclerae are white. Lenses are without opacity EARS: Pinnae are normal. Patient hears normal voice tunes of the examiner MOUTH and THROAT: Lips are without lesions. Oral mucosa is moist and without lesions. Tongue is normal in shape, size, and color and without lesions NECK: supple, trachea midline, no neck masses, no thyroid tenderness, no thyromegaly LYMPHATICS: no LAN in the neck,  no supraclavicular LAN RESPIRATORY: breathing is even & unlabored, BS CTAB CARDIAC: RRR, + murmur,no extra heart sounds, no edema GI: abdomen soft, normal BS, no masses, no tenderness, no hepatomegaly, no splenomegaly GU:  Has foley catheter draining to urine leg bag EXTREMITIES:  Able to move 4 extremities PSYCHIATRIC: Alert to person, disoriented to time and place. Affect and behavior are appropriate:      LABS/RADIOLOGY: Labs reviewed: Basic Metabolic Panel:  Recent Labs  06/09/16 0503 06/09/16 1048 06/09/16 1633 06/13/16 06/22/16 07/19/16  NA 145 140 139 142 144 145  K 4.7 4.5 4.3 4.2 4.2 3.7  CL 111 106 105  --   --   --   CO2 23 29 27   --   --   --   GLUCOSE 120* 119* 137*  --   --   --   BUN 46* 42* 42* 36* 37* 39*  CREATININE 1.65* 1.57* 1.54* 1.4* 1.6* 1.8*  CALCIUM 9.2 9.0 8.8*  --   --   --     CBC:  Recent Labs  06/04/16 1748 06/05/16 0547  06/07/16 0438 06/08/16 0323 06/08/16 1512 06/09/16 0503 06/13/16  WBC 5.9 5.1  < > 9.8 8.9  --  7.0 6.1  NEUTROABS 4.4 3.6  --   --   --   --   --   --   HGB 9.4* 8.8*  < > 7.6* 7.7* 7.6* 8.9* 9.6*  HCT 28.7* 26.9*  < > 23.0* 23.3* 23.1* 27.2* 30*  MCV 90.8 92.4  < > 91.3 91.4  --  91.3  --   PLT 197 178  < > 142* 141*  --  156 230  < > = values in this interval not displayed.     Dg Op Swallowing Func-medicare/speech Path  Result Date: 06/30/2016 Objective Swallowing Evaluation: Type of Study: MBS-Modified Barium Swallow Study Patient Details  Name: Kurt Thomas MRN: [id number] Date of Birth: 1918-10-06 Today's Date: 06/30/2016 Time: SLP Start Time (ACUTE ONLY): 1216-SLP Stop Time (ACUTE ONLY): 1254 SLP Time Calculation (min) (ACUTE ONLY): 38 min Past Medical History: Past Medical History: Diagnosis Date . Alzheimer's disease, focal onset  . Atrial fibrillation ([place])  . CAD (coronary artery disease)  . Chronic atrial fibrillation ([place])  . High blood pressure  . Low back pain  . Memory loss  . Prostate cancer ([place])  . Recurrent major depression ([place])  . Stroke [facility])  Past Surgical History: Past Surgical History: Procedure Laterality Date . CATARACT EXTRACTION   . CORONARY ARTERY BYPASS GRAFT   . FEMUR IM NAIL Right 06/05/2016  Procedure: INTRAMEDULLARY (IM) NAIL FEMORAL;  Surgeon: Mcarthur Rossetti, MD;  Location: [facility];  Service: Orthopedics;  Laterality: Right; . HIP ARTHROPLASTY Left 06/22/2014  Procedure: LEFT HEMIARTHROPLASTY HIP;  Surgeon: Meredith Pel, MD;  Location: [facility];  Service: Orthopedics;  Laterality: Left; . PROSTATECTOMY   HPI: Pt is a 83 year old male arriving for an outpatient MBS after recent hospitalization for hip fx. pt has a history of esophageal dysphagia and mild oropahryngeal dysphagia. MBS in 2016 (recommended after pt sensed aspriation on esophagram) showed premature spillage, delayed swallow no penetration or aspiration.  No Data Recorded Assessment / Plan / Recommendation CHL IP CLINICAL IMPRESSIONS 06/30/2016 Therapy Diagnosis Suspected primary esophageal dysphagia;Moderate oral phase dysphagia;Moderate pharyngeal phase dysphagia Clinical Impression Pt demonstrates a moderate oral dysphagia with lingual pumping of boluses and significant premature spillage as well as oral residual. This impacts pharyngeal swallow as premature spillage pools  in pyriforms and is aspirated before/during the swallow as larynx elevates. Oral residuals also spill to the valleculae post swallow and contribute to mild nectar penetrate. A  throat clear/cough with a second swallow aids in clearance of nectar penetrate and residue. Overall, strength of pharyngeal swallow is adequate, but timing and head/trunk support are very poor making sensed aspiration events probable during meals. Recommend a short term diet modification as pt rehabs from hip fx, though long term thickened liquids would not be recommended for a pt of advanced age with a significant esophageal phase dysphagia that is likely to aspirate reflux/backflow from esophagus as well. Recommend SLP at SNF/HH for diet management and use of cough/threat clear/second swallow as compensatory strategies.  Impact on safety and function Moderate aspiration risk   CHL IP TREATMENT RECOMMENDATION 06/30/2016 Treatment Recommendations Defer treatment plan to f/u with SLP   No flowsheet data found. CHL IP DIET RECOMMENDATION 06/30/2016 SLP Diet Recommendations Dysphagia 3 (Mech soft) solids;Nectar thick liquid Liquid Administration via Cup;Straw Medication Administration Crushed with puree Compensations Clear throat intermittently;Slow rate;Small sips/bites Postural Changes Seated upright at 90 degrees;Remain semi-upright after after feeds/meals (Comment)   No flowsheet data found.  CHL IP FOLLOW UP RECOMMENDATIONS 09/04/2014 Follow up Recommendations None   CHL IP FREQUENCY AND DURATION 09/07/2014 Speech Therapy Frequency (ACUTE ONLY) min 2x/week Treatment Duration 1 week      CHL IP ORAL PHASE 06/30/2016 Oral Phase Impaired Oral - Pudding Teaspoon -- Oral - Pudding Cup -- Oral - Honey Teaspoon -- Oral - Honey Cup -- Oral - Nectar Teaspoon Lingual pumping;Reduced posterior propulsion;Lingual/palatal residue;Delayed oral transit;Decreased bolus cohesion;Premature spillage Oral - Nectar Cup Lingual pumping;Reduced posterior propulsion;Lingual/palatal residue;Delayed oral transit;Decreased bolus cohesion;Premature spillage Oral - Nectar Straw Lingual pumping;Reduced posterior propulsion;Lingual/palatal  residue;Delayed oral transit;Decreased bolus cohesion;Premature spillage Oral - Thin Teaspoon Lingual pumping;Reduced posterior propulsion;Lingual/palatal residue;Delayed oral transit;Decreased bolus cohesion;Premature spillage Oral - Thin Cup Lingual pumping;Reduced posterior propulsion;Lingual/palatal residue;Delayed oral transit;Decreased bolus cohesion;Premature spillage Oral - Thin Straw Lingual pumping;Reduced posterior propulsion;Lingual/palatal residue;Delayed oral transit;Decreased bolus cohesion;Premature spillage Oral - Puree Lingual pumping;Reduced posterior propulsion;Lingual/palatal residue;Delayed oral transit;Decreased bolus cohesion Oral - Mech Soft -- Oral - Regular Lingual pumping;Reduced posterior propulsion;Lingual/palatal residue;Delayed oral transit;Decreased bolus cohesion;Premature spillage Oral - Multi-Consistency -- Oral - Pill -- Oral Phase - Comment --  CHL IP PHARYNGEAL PHASE 06/30/2016 Pharyngeal Phase Impaired Pharyngeal- Pudding Teaspoon -- Pharyngeal -- Pharyngeal- Pudding Cup -- Pharyngeal -- Pharyngeal- Honey Teaspoon -- Pharyngeal -- Pharyngeal- Honey Cup -- Pharyngeal -- Pharyngeal- Nectar Teaspoon Delayed swallow initiation-pyriform sinuses;Pharyngeal residue - valleculae;Penetration/Aspiration before swallow;Penetration/Aspiration during swallow Pharyngeal Material enters airway, remains ABOVE vocal cords and not ejected out Pharyngeal- Nectar Cup Delayed swallow initiation-pyriform sinuses;Pharyngeal residue - valleculae;Penetration/Aspiration before swallow;Penetration/Aspiration during swallow Pharyngeal Material enters airway, remains ABOVE vocal cords and not ejected out Pharyngeal- Nectar Straw Delayed swallow initiation-pyriform sinuses;Pharyngeal residue - valleculae;Penetration/Aspiration before swallow;Penetration/Aspiration during swallow Pharyngeal Material enters airway, remains ABOVE vocal cords and not ejected out Pharyngeal- Thin Teaspoon Delayed swallow  initiation-pyriform sinuses;Pharyngeal residue - valleculae;Penetration/Aspiration before swallow;Penetration/Aspiration during swallow;Trace aspiration Pharyngeal Material enters airway, passes BELOW cords without attempt by patient to eject out (silent aspiration) Pharyngeal- Thin Cup Delayed swallow initiation-pyriform sinuses;Pharyngeal residue - valleculae;Penetration/Aspiration before swallow;Penetration/Aspiration during swallow;Significant aspiration (Amount) Pharyngeal Material enters airway, passes BELOW cords and not ejected out despite cough attempt by patient Pharyngeal- Thin Straw Delayed swallow initiation-pyriform sinuses;Pharyngeal residue - valleculae;Penetration/Aspiration before swallow;Penetration/Aspiration during swallow;Trace aspiration Pharyngeal Material enters airway, passes BELOW cords and not ejected out despite cough attempt by patient Pharyngeal-  Puree Delayed swallow initiation-vallecula;Pharyngeal residue - valleculae Pharyngeal -- Pharyngeal- Mechanical Soft -- Pharyngeal -- Pharyngeal- Regular Delayed swallow initiation-vallecula;Pharyngeal residue - valleculae Pharyngeal -- Pharyngeal- Multi-consistency -- Pharyngeal -- Pharyngeal- Pill -- Pharyngeal -- Pharyngeal Comment --  CHL IP CERVICAL ESOPHAGEAL PHASE 06/30/2016 Cervical Esophageal Phase Impaired Pudding Teaspoon -- Pudding Cup -- Honey Teaspoon -- Honey Cup -- Nectar Teaspoon -- Nectar Cup -- Nectar Straw -- Thin Teaspoon -- Thin Cup -- Thin Straw -- Puree -- Mechanical Soft -- Regular -- Multi-consistency -- Pill -- Cervical Esophageal Comment CP bar, dilated proximal esophagus CHL IP GO 06/30/2016 Functional Assessment Tool Used clinical judgement Functional Limitations Swallowing Swallow Current Status [id number]) CK Swallow Goal Status [id number]) CK Swallow Discharge Status [id number]) CK Motor Speech Current Status [id number]) (None) Motor Speech Goal Status [id number]) (None) Motor Speech Goal Status [id number]) (None) Spoken Language  Comprehension Current Status [id number]) (None) Spoken Language Comprehension Goal Status [id number]) (None) Spoken Language Comprehension Discharge Status [id number]) (None) Spoken Language Expression Current Status [id number]) (None) Spoken Language Expression Goal Status [id number]) (None) Spoken Language Expression Discharge Status [phone]) (None) Attention Current Status [id number]) (None) Attention Goal Status [id number]) (None) Attention Discharge Status [id number]) (None) Memory Current Status AE:[id number]) (None) Memory Goal Status [id number]) (None) Memory Discharge Status [id number]) (None) Voice Current Status [id number]) (None) Voice Goal Status [id number]) (None) Voice Discharge Status [id number]) (None) Other Speech-Language Pathology Functional Limitation [id number]) (None) Other Speech-Language Pathology Functional Limitation Goal Status [id number]) (None) Other Speech-Language Pathology Functional Limitation Discharge Status [phone]) (None) DeBlois, Katherene Ponto 06/30/2016, 2:44 PM            CLINICAL DATA:  Dysphagia. EXAM: MODIFIED BARIUM SWALLOW TECHNIQUE: Different consistencies of barium were administered orally to the patient by the Speech Pathologist. Imaging of the pharynx was performed in the lateral projection. FLUOROSCOPY TIME:  Fluoroscopy Time:  3 minutes 42 second Radiation Exposure Index (if provided by the fluoroscopic device): Number of Acquired Spot Images: 0 COMPARISON:  09/06/2014 FINDINGS: Thin liquid- patient aspirated on the initial swallow. There was coughing. There was delayed swallowing trigger with retention. Additional swallows with tea spoon showed no aspiration. The patient aspirated with straw sips. Nectar thick liquid- delayed swallowing trigger with retention. No penetration Honey- not administered Pure- within normal limits Cracker-not administered Pure with cracker- not administered Barium tablet -  not administered IMPRESSION: Aspiration with thin barium. Delayed swallowing trigger with retention. Swallowing function was  improved with thicker consistencies. Please refer to the Speech Pathologists report for complete details and recommendations. Electronically Signed   By: Franchot Gallo M.D.   On: 06/30/2016 14:37    ASSESSMENT/PLAN:  Fractured right hip S/P IM nail -  follow-up with orthopedics ; continue acetaminophen 500 mg 2 tabs = 1000 mg by mouth every 8 hours when necessary and Norco 5/325 mg 1 tab by mouth every 6 hours when necessary for pain  Hypertension - continue amlodipine 10 mg 1 tab by mouth daily, carvedilol 12.5 mg 1 tab by mouth twice a day and BiDil 20-37.5 mg 1 tab by mouth twice a day  Glaucoma - continue Combigan 0.2-0.5% 1 drop to both eyes daily   Constipation - start senna S2 tabs by mouth twice a day and continue Miralax 17 gm PO BID to PRN  Alzheimer's dementia  - Aricept was recently discontinued; continue supportive care; fall precautions   Poor appetite - recently started on Remeron  Depression - continue Cymbalta 30 mg 1 tab by mouth daily  Overactive bladder - continue Myrbetriq 25 mg  1 tab by mouth daily  BPH - continue Flomax 0.4 mg 1 capsule by mouth every evening  CKD, stage III - GFR 37; check BMP Lab Results  Component Value Date   CREATININE 1.8 (A) 07/19/2016   GERD - continue ranitidine 150 mg 1 tab by mouth daily at bedtime  Pressure ulcer on sacrum, stage 3 - continue wound treatment; keep skin clean and dry; on gel overlay mattress for pressure reduction  Severe protein calorie malnutrition - continue prostat 30 mL twice a day and med Pass 120 mL PO 3 times a day  Atrial fibrillation - rate controlled; continue Coumadin and carvedilol 12.5 mg 1 tab by mouth twice a day  Anemia, acute blood loss - recheck CBC on 07/24/16 Lab Results  Component Value Date   HGB 9.6 (A) 06/13/2016       Goals of care:  Short-term rehabilitation    Izabelle Daus C. [place]  -  NP [employer] [phone]

## 2016-07-24 ENCOUNTER — Ambulatory Visit (INDEPENDENT_AMBULATORY_CARE_PROVIDER_SITE_OTHER): Payer: Medicare Other | Admitting: Orthopaedic Surgery

## 2016-07-24 ENCOUNTER — Emergency Department (HOSPITAL_COMMUNITY): Payer: Medicare Other

## 2016-07-24 ENCOUNTER — Inpatient Hospital Stay (HOSPITAL_COMMUNITY)
Admission: EM | Admit: 2016-07-24 | Discharge: 2016-07-28 | DRG: 194 | Disposition: A | Discharge status: Skilled Nursing Facility | Payer: Medicare Other | Attending: Internal Medicine | Admitting: Internal Medicine

## 2016-07-24 ENCOUNTER — Encounter (HOSPITAL_COMMUNITY): Payer: Self-pay | Admitting: Emergency Medicine

## 2016-07-24 DIAGNOSIS — J189 Pneumonia, unspecified organism: Secondary | ICD-10-CM | POA: Diagnosis not present

## 2016-07-24 DIAGNOSIS — J690 Pneumonitis due to inhalation of food and vomit: Secondary | ICD-10-CM | POA: Diagnosis present

## 2016-07-24 DIAGNOSIS — Y95 Nosocomial condition: Secondary | ICD-10-CM | POA: Diagnosis present

## 2016-07-24 DIAGNOSIS — N183 Chronic kidney disease, stage 3 unspecified: Secondary | ICD-10-CM | POA: Diagnosis present

## 2016-07-24 DIAGNOSIS — F339 Major depressive disorder, recurrent, unspecified: Secondary | ICD-10-CM | POA: Diagnosis present

## 2016-07-24 DIAGNOSIS — L89152 Pressure ulcer of sacral region, stage 2: Secondary | ICD-10-CM | POA: Diagnosis present

## 2016-07-24 DIAGNOSIS — I482 Chronic atrial fibrillation: Secondary | ICD-10-CM | POA: Diagnosis present

## 2016-07-24 DIAGNOSIS — Z951 Presence of aortocoronary bypass graft: Secondary | ICD-10-CM

## 2016-07-24 DIAGNOSIS — Z8673 Personal history of transient ischemic attack (TIA), and cerebral infarction without residual deficits: Secondary | ICD-10-CM

## 2016-07-24 DIAGNOSIS — Z87891 Personal history of nicotine dependence: Secondary | ICD-10-CM | POA: Diagnosis not present

## 2016-07-24 DIAGNOSIS — E860 Dehydration: Secondary | ICD-10-CM | POA: Diagnosis present

## 2016-07-24 DIAGNOSIS — Z96642 Presence of left artificial hip joint: Secondary | ICD-10-CM | POA: Diagnosis present

## 2016-07-24 DIAGNOSIS — R197 Diarrhea, unspecified: Secondary | ICD-10-CM | POA: Diagnosis present

## 2016-07-24 DIAGNOSIS — Z79899 Other long term (current) drug therapy: Secondary | ICD-10-CM

## 2016-07-24 DIAGNOSIS — I481 Persistent atrial fibrillation: Secondary | ICD-10-CM

## 2016-07-24 DIAGNOSIS — R918 Other nonspecific abnormal finding of lung field: Secondary | ICD-10-CM | POA: Diagnosis not present

## 2016-07-24 DIAGNOSIS — R791 Abnormal coagulation profile: Secondary | ICD-10-CM | POA: Diagnosis present

## 2016-07-24 DIAGNOSIS — I129 Hypertensive chronic kidney disease with stage 1 through stage 4 chronic kidney disease, or unspecified chronic kidney disease: Secondary | ICD-10-CM | POA: Diagnosis present

## 2016-07-24 DIAGNOSIS — I2510 Atherosclerotic heart disease of native coronary artery without angina pectoris: Secondary | ICD-10-CM | POA: Diagnosis present

## 2016-07-24 DIAGNOSIS — Z8546 Personal history of malignant neoplasm of prostate: Secondary | ICD-10-CM

## 2016-07-24 DIAGNOSIS — I10 Essential (primary) hypertension: Secondary | ICD-10-CM | POA: Diagnosis present

## 2016-07-24 DIAGNOSIS — N179 Acute kidney failure, unspecified: Secondary | ICD-10-CM | POA: Diagnosis present

## 2016-07-24 DIAGNOSIS — D649 Anemia, unspecified: Secondary | ICD-10-CM | POA: Diagnosis present

## 2016-07-24 DIAGNOSIS — I959 Hypotension, unspecified: Secondary | ICD-10-CM | POA: Diagnosis present

## 2016-07-24 DIAGNOSIS — Z7901 Long term (current) use of anticoagulants: Secondary | ICD-10-CM

## 2016-07-24 DIAGNOSIS — I4891 Unspecified atrial fibrillation: Secondary | ICD-10-CM | POA: Diagnosis present

## 2016-07-24 DIAGNOSIS — G309 Alzheimer's disease, unspecified: Secondary | ICD-10-CM | POA: Diagnosis present

## 2016-07-24 DIAGNOSIS — F0280 Dementia in other diseases classified elsewhere without behavioral disturbance: Secondary | ICD-10-CM | POA: Diagnosis present

## 2016-07-24 LAB — COMPREHENSIVE METABOLIC PANEL
ALT: 12 U/L — AB (ref 17–63)
AST: 14 U/L — AB (ref 15–41)
Albumin: 3 g/dL — ABNORMAL LOW (ref 3.5–5.0)
Alkaline Phosphatase: 99 U/L (ref 38–126)
Anion gap: 7 (ref 5–15)
BILIRUBIN TOTAL: 0.6 mg/dL (ref 0.3–1.2)
BUN: 46 mg/dL — AB (ref 6–20)
CALCIUM: 9 mg/dL (ref 8.9–10.3)
CO2: 26 mmol/L (ref 22–32)
Chloride: 109 mmol/L (ref 101–111)
Creatinine, Ser: 2.16 mg/dL — ABNORMAL HIGH (ref 0.61–1.24)
GFR calc Af Amer: 28 mL/min — ABNORMAL LOW (ref >60)
GFR, EST NON AFRICAN AMERICAN: 24 mL/min — AB (ref >60)
Glucose, Bld: 142 mg/dL — ABNORMAL HIGH (ref 65–99)
Potassium: 3.5 mmol/L (ref 3.5–5.1)
Sodium: 142 mmol/L (ref 135–145)
TOTAL PROTEIN: 6.2 g/dL — AB (ref 6.5–8.1)

## 2016-07-24 LAB — CBC AND DIFFERENTIAL
HCT: 33 % — AB (ref 41–53)
Hemoglobin: 10.6 g/dL — AB (ref 13.5–17.5)
NEUTROS ABS: 20 /uL
PLATELETS: 209 10*3/uL (ref 150–399)
WBC: 23.2 10*3/mL

## 2016-07-24 LAB — I-STAT CG4 LACTIC ACID, ED: Lactic Acid, Venous: 1.14 mmol/L (ref 0.5–1.9)

## 2016-07-24 LAB — CBC
HCT: 32.1 % — ABNORMAL LOW (ref 39.0–52.0)
Hemoglobin: 10.4 g/dL — ABNORMAL LOW (ref 13.0–17.0)
MCH: 30 pg (ref 26.0–34.0)
MCHC: 32.4 g/dL (ref 30.0–36.0)
MCV: 92.5 fL (ref 78.0–100.0)
PLATELETS: 182 10*3/uL (ref 150–400)
RBC: 3.47 MIL/uL — ABNORMAL LOW (ref 4.22–5.81)
RDW: 16 % — AB (ref 11.5–15.5)
WBC: 19.8 10*3/uL — AB (ref 4.0–10.5)

## 2016-07-24 LAB — PROTIME-INR
INR: 1.61
PROTHROMBIN TIME: 19.4 s — AB (ref 11.4–15.2)

## 2016-07-24 LAB — LIPASE, BLOOD: Lipase: 21 U/L (ref 11–51)

## 2016-07-24 MED ORDER — ALBUTEROL SULFATE (2.5 MG/3ML) 0.083% IN NEBU: 2.5000 mg | INHALATION_SOLUTION | RESPIRATORY_TRACT | Status: DC | PRN

## 2016-07-24 MED ORDER — MIRTAZAPINE 30 MG PO TABS
15.0000 mg | ORAL_TABLET | Freq: Every day | ORAL | Status: DC
  Administered 2016-07-25 – 2016-07-27 (×4): 15 mg via ORAL
  Filled 2016-07-24: qty 1
  Filled 2016-07-24: qty 0.5
  Filled 2016-07-24: qty 1
  Filled 2016-07-24 (×3): qty 0.5
  Filled 2016-07-24: qty 1

## 2016-07-24 MED ORDER — TIMOLOL MALEATE 0.5 % OP SOLN
1.0000 [drp] | Freq: Two times a day (BID) | OPHTHALMIC | Status: DC
  Administered 2016-07-25 – 2016-07-28 (×8): 1 [drp] via OPHTHALMIC
  Filled 2016-07-24: qty 5

## 2016-07-24 MED ORDER — FAMOTIDINE 20 MG PO TABS
20.0000 mg | ORAL_TABLET | Freq: Two times a day (BID) | ORAL | Status: DC
  Administered 2016-07-25 (×2): 20 mg via ORAL
  Filled 2016-07-24 (×2): qty 1

## 2016-07-24 MED ORDER — ONDANSETRON HCL 4 MG PO TABS: 4.0000 mg | ORAL_TABLET | Freq: Four times a day (QID) | ORAL | Status: DC | PRN

## 2016-07-24 MED ORDER — VANCOMYCIN HCL IN DEXTROSE 1-5 GM/200ML-% IV SOLN
1000.0000 mg | Freq: Once | INTRAVENOUS | Status: AC
  Administered 2016-07-24: 1000 mg via INTRAVENOUS
  Filled 2016-07-24: qty 200

## 2016-07-24 MED ORDER — WARFARIN - PHARMACIST DOSING INPATIENT: Freq: Every day | Status: DC

## 2016-07-24 MED ORDER — DEXTROSE 5 % IV SOLN
1.0000 g | INTRAVENOUS | Status: DC
  Administered 2016-07-25 – 2016-07-28 (×4): 1 g via INTRAVENOUS
  Filled 2016-07-24 (×4): qty 1

## 2016-07-24 MED ORDER — ACETAMINOPHEN 325 MG PO TABS: 650.0000 mg | ORAL_TABLET | Freq: Four times a day (QID) | ORAL | Status: DC | PRN

## 2016-07-24 MED ORDER — ENSURE ENLIVE PO LIQD
120.0000 mL | Freq: Three times a day (TID) | ORAL | Status: DC
  Administered 2016-07-25 – 2016-07-28 (×9): 120 mL via ORAL

## 2016-07-24 MED ORDER — WARFARIN SODIUM 3 MG PO TABS
3.0000 mg | ORAL_TABLET | Freq: Once | ORAL | Status: AC
  Administered 2016-07-25: 3 mg via ORAL
  Filled 2016-07-24: qty 1

## 2016-07-24 MED ORDER — CENTRATEX 106-1 MG PO CAPS: 1.0000 | ORAL_CAPSULE | Freq: Every day | ORAL | Status: DC

## 2016-07-24 MED ORDER — SODIUM CHLORIDE 0.9 % IV BOLUS (SEPSIS)
500.0000 mL | Freq: Once | INTRAVENOUS | Status: AC
  Administered 2016-07-25: 500 mL via INTRAVENOUS

## 2016-07-24 MED ORDER — SODIUM CHLORIDE 0.9 % IV BOLUS (SEPSIS)
1000.0000 mL | Freq: Once | INTRAVENOUS | Status: AC
  Administered 2016-07-24: 1000 mL via INTRAVENOUS

## 2016-07-24 MED ORDER — COLLAGEN HYDROLYSATE (BOVINE) POWD: 1.0000 "application " | Status: DC | PRN

## 2016-07-24 MED ORDER — BRIMONIDINE TARTRATE 0.2 % OP SOLN
1.0000 [drp] | Freq: Two times a day (BID) | OPHTHALMIC | Status: DC
  Administered 2016-07-25 – 2016-07-28 (×8): 1 [drp] via OPHTHALMIC
  Filled 2016-07-24: qty 5

## 2016-07-24 MED ORDER — CARVEDILOL 12.5 MG PO TABS
12.5000 mg | ORAL_TABLET | Freq: Two times a day (BID) | ORAL | Status: DC
  Administered 2016-07-25 – 2016-07-28 (×8): 12.5 mg via ORAL
  Filled 2016-07-24 (×8): qty 1

## 2016-07-24 MED ORDER — ONDANSETRON HCL 4 MG/2ML IJ SOLN: 4.0000 mg | Freq: Four times a day (QID) | INTRAMUSCULAR | Status: DC | PRN

## 2016-07-24 MED ORDER — SODIUM CHLORIDE 0.9 % IV SOLN: INTRAVENOUS | Status: AC

## 2016-07-24 MED ORDER — DIPHENHYDRAMINE HCL 12.5 MG/5ML PO ELIX
12.5000 mg | ORAL_SOLUTION | Freq: Three times a day (TID) | ORAL | Status: DC | PRN
  Administered 2016-07-28: 12.5 mg via ORAL
  Filled 2016-07-24: qty 5

## 2016-07-24 MED ORDER — PIPERACILLIN-TAZOBACTAM 3.375 G IVPB
3.3750 g | Freq: Once | INTRAVENOUS | Status: AC
  Administered 2016-07-24: 3.375 g via INTRAVENOUS
  Filled 2016-07-24: qty 50

## 2016-07-24 MED ORDER — LATANOPROST 0.005 % OP SOLN
1.0000 [drp] | Freq: Every day | OPHTHALMIC | Status: DC
  Administered 2016-07-25 – 2016-07-27 (×4): 1 [drp] via OPHTHALMIC
  Filled 2016-07-24: qty 2.5

## 2016-07-24 MED ORDER — HYDROCODONE-ACETAMINOPHEN 5-325 MG PO TABS: 1.0000 | ORAL_TABLET | Freq: Four times a day (QID) | ORAL | Status: DC | PRN

## 2016-07-24 MED ORDER — FLUTICASONE PROPIONATE 50 MCG/ACT NA SUSP
2.0000 | Freq: Every day | NASAL | Status: DC
  Administered 2016-07-25 – 2016-07-27 (×4): 2 via NASAL
  Filled 2016-07-24: qty 16

## 2016-07-24 MED ORDER — BRIMONIDINE TARTRATE-TIMOLOL 0.2-0.5 % OP SOLN
1.0000 [drp] | Freq: Two times a day (BID) | OPHTHALMIC | Status: DC
  Filled 2016-07-24: qty 5

## 2016-07-24 MED ORDER — ISOSORB DINITRATE-HYDRALAZINE 20-37.5 MG PO TABS
1.0000 | ORAL_TABLET | Freq: Two times a day (BID) | ORAL | Status: DC
  Administered 2016-07-25 – 2016-07-28 (×8): 1 via ORAL
  Filled 2016-07-24 (×8): qty 1

## 2016-07-24 MED ORDER — SODIUM CHLORIDE 0.9 % IV SOLN
INTRAVENOUS | Status: DC
  Administered 2016-07-24: 22:00:00 via INTRAVENOUS

## 2016-07-24 MED ORDER — VANCOMYCIN HCL IN DEXTROSE 750-5 MG/150ML-% IV SOLN: 750.0000 mg | INTRAVENOUS | Status: DC

## 2016-07-24 MED ORDER — ENSURE ENLIVE PO LIQD
237.0000 mL | Freq: Every day | ORAL | Status: DC
  Administered 2016-07-25 – 2016-07-27 (×3): 237 mL via ORAL

## 2016-07-24 MED ORDER — TAMSULOSIN HCL 0.4 MG PO CAPS
0.4000 mg | ORAL_CAPSULE | Freq: Every evening | ORAL | Status: DC
  Administered 2016-07-25 – 2016-07-27 (×4): 0.4 mg via ORAL
  Filled 2016-07-24 (×4): qty 1

## 2016-07-24 MED ORDER — FE FUMARATE-B12-VIT C-FA-IFC PO CAPS
1.0000 | ORAL_CAPSULE | Freq: Every day | ORAL | Status: DC
  Administered 2016-07-25 – 2016-07-28 (×4): 1 via ORAL
  Filled 2016-07-24 (×4): qty 1

## 2016-07-24 MED ORDER — PRO-STAT SUGAR FREE PO LIQD
30.0000 mL | Freq: Two times a day (BID) | ORAL | Status: DC
  Administered 2016-07-25 – 2016-07-28 (×8): 30 mL via ORAL
  Filled 2016-07-24 (×8): qty 30

## 2016-07-24 MED ORDER — MIRABEGRON ER 25 MG PO TB24
25.0000 mg | ORAL_TABLET | Freq: Every day | ORAL | Status: DC
  Administered 2016-07-25 – 2016-07-28 (×4): 25 mg via ORAL
  Filled 2016-07-24 (×4): qty 1

## 2016-07-24 MED ORDER — DULOXETINE HCL 30 MG PO CPEP
30.0000 mg | ORAL_CAPSULE | Freq: Every evening | ORAL | Status: DC
  Administered 2016-07-25 – 2016-07-27 (×4): 30 mg via ORAL
  Filled 2016-07-24 (×4): qty 1

## 2016-07-24 NOTE — ED Triage Notes (Signed)
Pt from [facility]. Per facility, they ran labs and note that pt had a high wbc count of 23.2. The facility has had a high occurrence of residents with stomach bug complaints, so the house doctor suggested pt come be evaluated at the ED. Pt had diarrhea yesterday and today 3 times per facility. Per facility pt is at baseline for mental status. Pt is hypotensive at time of assessment, for which EMS has started 1 liter of NS. Pt denies any complaints or discomfort.

## 2016-07-24 NOTE — ED Notes (Signed)
Unable to obtain urine sample by I&O cath; MD made aware

## 2016-07-24 NOTE — Progress Notes (Signed)
Pharmacy Antibiotic Note  Kurt Thomas is a 83 y.o. male admitted on 07/24/2016 with pneumonia.  In the ED patient received Vancomycin 1gm and Zosyn 3.375gm IV x 1 dose each.  Upon admission, Pharmacy has been consulted for Vancomycin and Cefepime dosing.  Plan:  Vancomycin 750mg  IV q48h  Vancomycin trough: 15-20 mcg/ml  Cefepime 1gm IV q24h  F/U renal function, clinical course, culture sensitivities  Weight: 118 lb (53.5 kg)  Temp (24hrs), Avg:97.5 F (36.4 C), Min:97.5 F (36.4 C), Max:97.5 F (36.4 C)   Recent Labs Lab 07/19/16 07/24/16 1914 07/24/16 2021  WBC  --  19.8*  --   CREATININE 1.8* 2.16*  --   LATICACIDVEN  --   --  1.14    Estimated Creatinine Clearance: 15.5 mL/min (by C-G formula based on SCr of 2.16 mg/dL (H)).    No Known Allergies  Antimicrobials this admission: 1/15 Zosyn x 1 1/15 Vanc >>   1/16 Cefepime >>  Dose adjustments this admission:    Microbiology results: 1/15 BCx: sent 1/15 UCx: sent  1/15 CDiff: sent   Thank you for allowing pharmacy to be a part of this patient's care.  Everette Rank, PharmD 07/24/2016 11:40 PM

## 2016-07-24 NOTE — ED Notes (Signed)
Attempted to do an in and out cath but was unsuccessful

## 2016-07-24 NOTE — ED Provider Notes (Signed)
[facility] DEPT Provider Note   CSN: [id number] Arrival date & time: 07/24/16  1807     History   Chief Complaint Chief Complaint  Patient presents with  . Diarrhea  . high wbc count    HPI Kurt Thomas is a 83 y.o. male.  83 year old male who presents from nursing home facility with leukocytosis up to 23.2 thousand. Patient does have a history of Alzheimer's and has no complaints. He is with his son-in-law who states that patient is at his baseline. There does appear to be what sounds like nor virus going through the nursing home. Patient has had some diarrhea but no vomiting. Was found be hypotensive at the nursing home and EMS was called and patient given 1 L of IV saline and transported here.      Past Medical History:  Diagnosis Date  . Alzheimer's disease, focal onset   . Atrial fibrillation ([place])   . CAD (coronary artery disease)   . Chronic atrial fibrillation ([place])   . High blood pressure   . Low back pain   . Memory loss   . Prostate cancer ([place])   . Recurrent major depression ([place])   . Stroke [facility])     Patient Active Problem List   Diagnosis Date Noted  . Pressure injury of skin 06/08/2016  . Chronic anticoagulation 06/05/2016  . BPH (benign prostatic hyperplasia) 06/05/2016  . Fractured hip, right, closed, initial encounter ([place]) 06/04/2016  . Protein-calorie malnutrition, severe ([place]) 09/05/2014  . PVC's (premature ventricular contractions)   . CKD (chronic kidney disease) stage 3, GFR 30-59 ml/min 09/03/2014  . Acute encephalopathy 09/03/2014  . Vomiting 09/03/2014  . Difficulty urinating 09/03/2014  . CAD (coronary artery disease)   . Essential hypertension 06/22/2014  . Atrial fibrillation ([place]) 06/21/2014  . Hip fracture ([place]) 06/20/2014  . Renal insufficiency 06/20/2014  . Closed left hip fracture ([place])   . Lumbar stenosis 05/07/2014  . Low back pain 03/31/2014  . Gait difficulty 03/31/2014  . High blood pressure     Past Surgical  History:  Procedure Laterality Date  . CATARACT EXTRACTION    . CORONARY ARTERY BYPASS GRAFT    . FEMUR IM NAIL Right 06/05/2016   Procedure: INTRAMEDULLARY (IM) NAIL FEMORAL;  Surgeon: Mcarthur Rossetti, MD;  Location: [facility];  Service: Orthopedics;  Laterality: Right;  . HIP ARTHROPLASTY Left 06/22/2014   Procedure: LEFT HEMIARTHROPLASTY HIP;  Surgeon: Meredith Pel, MD;  Location: [facility];  Service: Orthopedics;  Laterality: Left;  . PROSTATECTOMY         Home Medications    Prior to Admission medications   Medication Sig Start Date End Date Taking? Authorizing Provider  acetaminophen (TYLENOL) 500 MG tablet Take 1,000 mg by mouth every 8 (eight) hours as needed (pain). Maximum 3 tablets in 24 hours    Historical Provider, MD  albuterol (PROVENTIL) (2.5 MG/3ML) 0.083% nebulizer solution Take 2.5 mg by nebulization every 6 (six) hours as needed for wheezing.     Historical Provider, MD  Amino Acids-Protein Hydrolys (FEEDING SUPPLEMENT, PRO-STAT SUGAR FREE 64,) LIQD Take 30 mLs by mouth 2 (two) times daily.    Historical Provider, MD  amLODipine (NORVASC) 10 MG tablet Take 1 tablet (10 mg total) by mouth daily. 09/08/14   Oswald Hillock, MD  brimonidine-timolol (COMBIGAN) 0.2-0.5 % ophthalmic solution Place 1 drop into both eyes every 12 (twelve) hours.    Historical Provider, MD  carvedilol (COREG) 12.5 MG tablet Take 12.5 mg by  mouth 2 (two) times daily with a meal.     Historical Provider, MD  diphenhydrAMINE (BENYLIN) 12.5 MG/5ML syrup Take 12.5 mg by mouth 3 (three) times daily as needed for itching.    Historical Provider, MD  docusate sodium (COLACE) 100 MG capsule Take 1 capsule (100 mg total) by mouth daily as needed for mild constipation. 09/08/14   Oswald Hillock, MD  DULoxetine (CYMBALTA) 30 MG capsule Take 30 mg by mouth daily.     Historical Provider, MD  Fe Fum-FA-B Cmp-C-Zn-Mg-Mn-Cu (CENTRATEX) 106-1 MG CAPS Take 1 capsule by mouth daily.    Historical Provider, MD    fluticasone (FLONASE) 50 MCG/ACT nasal spray Place 2 sprays into both nostrils at bedtime.    Historical Provider, MD  HYDROcodone-acetaminophen (NORCO/VICODIN) 5-325 MG tablet Take 1 tablet by mouth every 6 (six) hours as needed for severe pain. 06/06/16   Mcarthur Rossetti, MD  isosorbide-hydrALAZINE (BIDIL) 20-37.5 MG per tablet Take 1 tablet by mouth 2 (two) times daily. 06/24/14   Barton Dubois, MD  latanoprost (XALATAN) 0.005 % ophthalmic solution Place 1 drop into both eyes at bedtime.    Historical Provider, MD  loperamide (IMODIUM) 2 MG capsule Take 2 mg by mouth every 6 (six) hours as needed for diarrhea or loose stools.    Historical Provider, MD  mirabegron ER (MYRBETRIQ) 25 MG TB24 tablet Take 25 mg by mouth daily.     Historical Provider, MD  mirtazapine (REMERON) 15 MG tablet Take 15 mg by mouth daily.    Historical Provider, MD  NUTRITIONAL SUPPLEMENT LIQD Take 120 mLs by mouth 3 (three) times daily. MedPass    Historical Provider, MD  ondansetron (ZOFRAN) 4 MG tablet Take 4 mg by mouth every 8 (eight) hours as needed for nausea or vomiting. Maximum 3 tablets in 25 hours    Historical Provider, MD  polyethylene glycol (MIRALAX / GLYCOLAX) packet Take 17 g by mouth daily as needed. Mix in 8 oz liquid and drink    Historical Provider, MD  ranitidine (ZANTAC) 150 MG tablet Take 150 mg by mouth at bedtime.     Historical Provider, MD  tamsulosin (FLOMAX) 0.4 MG CAPS capsule Take 0.4 mg by mouth every evening. 5pm     Historical Provider, MD  warfarin (COUMADIN) 3 MG tablet Take 1.5 mg by mouth daily. Take 1/2 of a 3 mg tablet to = 1.5 mg    Historical Provider, MD    Family History Family History  Problem Relation Age of Onset  . Cancer Father   . High blood pressure      Social History Social History  Substance Use Topics  . Smoking status: Former Smoker    Packs/day: 1.00    Years: 1.00    Types: Cigarettes    Quit date: 07/10/1956  . Smokeless tobacco: Never Used  .  Alcohol use No     Allergies   Patient has no known allergies.   Review of Systems Review of Systems  Unable to perform ROS: Dementia     Physical Exam Updated Vital Signs BP 127/60   Pulse 82   Temp 97.5 F (36.4 C) (Oral)   Resp 18   SpO2 100%   Physical Exam  Constitutional: He is oriented to person, place, and time. He appears well-developed and well-nourished.  Non-toxic appearance. No distress.  HENT:  Head: Normocephalic and atraumatic.  Eyes: Conjunctivae, EOM and lids are normal. Pupils are equal, round, and reactive to light.  Neck:  Normal range of motion. Neck supple. No tracheal deviation present. No thyroid mass present.  Cardiovascular: Normal rate, regular rhythm and normal heart sounds.  Exam reveals no gallop.   No murmur heard. Pulmonary/Chest: Effort normal and breath sounds normal. No stridor. No respiratory distress. He has no decreased breath sounds. He has no wheezes. He has no rhonchi. He has no rales.  Abdominal: Soft. Normal appearance and bowel sounds are normal. He exhibits no distension. There is no tenderness. There is no rebound and no CVA tenderness.  Musculoskeletal: Normal range of motion. He exhibits no edema or tenderness.  Neurological: He is alert and oriented to person, place, and time. He has normal strength. No cranial nerve deficit or sensory deficit. GCS eye subscore is 4. GCS verbal subscore is 5. GCS motor subscore is 6.  Skin: Skin is warm and dry. No abrasion and no rash noted.  Psychiatric: His speech is normal and behavior is normal. His affect is blunt.  Nursing note and vitals reviewed.    ED Treatments / Results  Labs (all labs ordered are listed, but only abnormal results are displayed) Labs Reviewed  LIPASE, BLOOD  COMPREHENSIVE METABOLIC PANEL  CBC  URINALYSIS, ROUTINE W REFLEX MICROSCOPIC    EKG  EKG Interpretation None       Radiology No results found.  Procedures Procedures (including critical care  time)  Medications Ordered in ED Medications - No data to display   Initial Impression / Assessment and Plan / ED Course  I have reviewed the triage vital signs and the nursing notes.  Pertinent labs & imaging results that were available during my care of the patient were reviewed by me and considered in my medical decision making (see chart for details).  Clinical Course   Patient started on IV antibiotics for percent of pneumonia. Will be admitted for observation  Final Clinical Impressions(s) / ED Diagnoses   Final diagnoses:  None    New Prescriptions New Prescriptions   No medications on file     Lacretia Leigh, MD 07/24/16 2232

## 2016-07-24 NOTE — Progress Notes (Signed)
ANTICOAGULATION CONSULT NOTE - Initial Consult  Pharmacy Consult for Warfarin Indication: atrial fibrillation  No Known Allergies  Patient Measurements: Height: 5\' 6"  (167.6 cm) Weight: 119 lb 7.8 oz (54.2 kg) IBW/kg (Calculated) : 63.8  Vital Signs: Temp: 97.8 F (36.6 C) (01/15 2349) Temp Source: Oral (01/15 2349) BP: 120/87 (01/15 2349) Pulse Rate: 79 (01/15 2349)  Labs:  Recent Labs  07/24/16 1914 07/24/16 2009  HGB 10.4*  --   HCT 32.1*  --   PLT 182  --   LABPROT  --  19.4*  INR  --  1.61  CREATININE 2.16*  --     Estimated Creatinine Clearance: 15.7 mL/min (by C-G formula based on SCr of 2.16 mg/dL (H)).   Medical History: Past Medical History:  Diagnosis Date  . Alzheimer's disease, focal onset   . Atrial fibrillation ([place])   . CAD (coronary artery disease)   . Chronic atrial fibrillation ([place])   . High blood pressure   . Low back pain   . Memory loss   . Prostate cancer ([place])   . Recurrent major depression ([place])   . Stroke [facility])      Assessment:   83 yr male admitted with pneumonia and diarrhea  PMH significant for AFib (on warfarin), HTN and CKD  Pharmacy consulted to dose warfarin  PTA on warfarin 2mg  daily - Last dose on 1/14 @20 :26  INR upon admission = 1.61  Goal of Therapy:  INR 2-3   Plan:   Warfarin 3mg  po x 1 dose tonight  Check daily PT/INR   Mert Dietrick, Toribio [place]bour, PharmD 07/24/2016,11:54 PM

## 2016-07-24 NOTE — H&P (Signed)
History and Physical    Kurt Thomas [id number] DOB: 1919/05/12 DOA: 07/24/2016  PCP: Melinda Crutch, MD   Patient coming from: Nursing Home  Chief Complaint: Diarrhea, leukocytosis, cough  HPI: Kurt Thomas is a 83 y.o. male with medical history significant for Alzheimer dementia, chronic atrial fibrillation on warfarin, hypertension, and chronic kidney disease stage III who presents from his nursing facility for evaluation of diarrhea and leukocytosis. Patient was noted to have non-bloody diarrhea at the nursing home yesterday and this has persisted today. He was evaluated at his facility with basic blood work and a leukocytosis to 23,200 was reported. He has not had documented fever at the nursing home and has not complained of abdominal pain. There has reportedly been multiple other residents with similar symptoms. Patient is also noted a productive cough, but denies chest pain, palpitations, dyspnea, or swelling. He has dementia, and per his son, is confused at baseline, but talkative and cooperative. He has remained in his usual state that aside from the non-bloody diarrhea and has not voiced any specific complaints. Given the marked leukocytosis and concern for infectious diarrhea, EMS was called out for transport to the hospital. He was reportedly found to be hypotensive upon EMS arrival and 1 L of normal saline was given en route.   ED Course: Upon arrival to the ED, patient is found to be afebrile, saturating well on room air, with blood pressure 106/48, and vitals otherwise stable. Chest x-ray features increased opacity at the left base concerning for pneumonia. Chemistry panel reveals a BUN of 46 and serum creatinine of 2.16, up from an apparent baseline of 1.5. CBC is notable for a leukocytosis to 19,800 and a stable normocytic anemia with hemoglobin 10.4. Lactic acid is reassuring at 1.14 and INR is subtherapeutic at 1.61. Blood cultures were obtained, 1 L of normal saline was given, and the  patient was started on Zosyn and vancomycin in the ED. Urine has not been collected and in and out catheterization was unsuccessful in the ED. GI pathogen panel is also pending. Patient has remained hemodynamically stable following the IV fluids and he is not in acute respiratory distress. Given his age and comorbidity, he will be observed in the hospital for initial treatment of HCAP and further evaluation and management of diarrhea.   Review of Systems:  Unable to obtain complete ROS secondary to patient's clinical condition with dementia.  Past Medical History:  Diagnosis Date  . Alzheimer's disease, focal onset   . Atrial fibrillation ([place])   . CAD (coronary artery disease)   . Chronic atrial fibrillation ([place])   . High blood pressure   . Low back pain   . Memory loss   . Prostate cancer ([place])   . Recurrent major depression ([place])   . Stroke [facility])     Past Surgical History:  Procedure Laterality Date  . CATARACT EXTRACTION    . CORONARY ARTERY BYPASS GRAFT    . FEMUR IM NAIL Right 06/05/2016   Procedure: INTRAMEDULLARY (IM) NAIL FEMORAL;  Surgeon: Mcarthur Rossetti, MD;  Location: [facility];  Service: Orthopedics;  Laterality: Right;  . HIP ARTHROPLASTY Left 06/22/2014   Procedure: LEFT HEMIARTHROPLASTY HIP;  Surgeon: Meredith Pel, MD;  Location: [facility];  Service: Orthopedics;  Laterality: Left;  . PROSTATECTOMY       reports that he quit smoking about 60 years ago. His smoking use included Cigarettes. He has a 1.00 pack-year smoking history. He has never used smokeless tobacco. He reports  that he does not drink alcohol or use drugs.  No Known Allergies  Family History  Problem Relation Age of Onset  . Cancer Father   . High blood pressure       Prior to Admission medications   Medication Sig Start Date End Date Taking? Authorizing Provider  acetaminophen (TYLENOL) 500 MG tablet Take 1,000 mg by mouth every 8 (eight) hours as needed (pain). Maximum 3 tablets in 24  hours   Yes Historical Provider, MD  albuterol (PROVENTIL) (2.5 MG/3ML) 0.083% nebulizer solution Take 2.5 mg by nebulization every 6 (six) hours as needed for wheezing.    Yes Historical Provider, MD  Amino Acids-Protein Hydrolys (FEEDING SUPPLEMENT, PRO-STAT SUGAR FREE 64,) LIQD Take 30 mLs by mouth 2 (two) times daily.   Yes Historical Provider, MD  amLODipine (NORVASC) 10 MG tablet Take 1 tablet (10 mg total) by mouth daily. Patient taking differently: Take 10 mg by mouth daily with breakfast.  09/08/14  Yes Oswald Hillock, MD  brimonidine-timolol (COMBIGAN) 0.2-0.5 % ophthalmic solution Place 1 drop into both eyes every 12 (twelve) hours.   Yes Historical Provider, MD  carvedilol (COREG) 12.5 MG tablet Take 12.5 mg by mouth 2 (two) times daily with a meal.    Yes Historical Provider, MD  Collagen Hydrolysate, Bovine, POWD Apply 1 application topically as needed (for wound care).   Yes Historical Provider, MD  diphenhydrAMINE (BENYLIN) 12.5 MG/5ML syrup Take 12.5 mg by mouth 3 (three) times daily as needed for itching.   Yes Historical Provider, MD  docusate sodium (COLACE) 100 MG capsule Take 1 capsule (100 mg total) by mouth daily as needed for mild constipation. 09/08/14  Yes Oswald Hillock, MD  DULoxetine (CYMBALTA) 30 MG capsule Take 30 mg by mouth every evening.    Yes Historical Provider, MD  Fe Fum-FA-B Cmp-C-Zn-Mg-Mn-Cu (CENTRATEX) 106-1 MG CAPS Take 1 capsule by mouth daily with breakfast.    Yes Historical Provider, MD  fluticasone (FLONASE) 50 MCG/ACT nasal spray Place 2 sprays into both nostrils at bedtime.   Yes Historical Provider, MD  HYDROcodone-acetaminophen (NORCO/VICODIN) 5-325 MG tablet Take 1 tablet by mouth every 6 (six) hours as needed for severe pain. 06/06/16  Yes Mcarthur Rossetti, MD  isosorbide-hydrALAZINE (BIDIL) 20-37.5 MG per tablet Take 1 tablet by mouth 2 (two) times daily. 06/24/14  Yes Barton Dubois, MD  latanoprost (XALATAN) 0.005 % ophthalmic solution Place 1  drop into both eyes at bedtime.   Yes Historical Provider, MD  loperamide (IMODIUM) 2 MG capsule Take 2 mg by mouth every 6 (six) hours as needed for diarrhea or loose stools.   Yes Historical Provider, MD  mirabegron ER (MYRBETRIQ) 25 MG TB24 tablet Take 25 mg by mouth daily with breakfast.    Yes Historical Provider, MD  mirtazapine (REMERON) 15 MG tablet Take 15 mg by mouth every evening.    Yes Historical Provider, MD  neomycin-bacitracin-polymyxin (NEOSPORIN) ointment Apply 1 application topically as needed for wound care. apply to eye   Yes Historical Provider, MD  NUTRITIONAL SUPPLEMENT LIQD Take 1 each by mouth daily with lunch. *Magic Cup*   Yes Historical Provider, MD  Nutritional Supplements (NUTRITIONAL DRINK) LIQD Take 120 mLs by mouth 3 (three) times daily between meals. *Med Pass*   Yes Historical Provider, MD  ondansetron (ZOFRAN) 4 MG tablet Take 4 mg by mouth every 8 (eight) hours as needed for nausea or vomiting.    Yes Historical Provider, MD  polyethylene glycol (MIRALAX / GLYCOLAX)  packet Take 17 g by mouth daily as needed. Mix in 8 oz liquid and drink   Yes Historical Provider, MD  ranitidine (ZANTAC) 150 MG tablet Take 150 mg by mouth at bedtime.    Yes Historical Provider, MD  senna-docusate (SENOKOT-S) 8.6-50 MG tablet Take 2 tablets by mouth 2 (two) times daily.   Yes Historical Provider, MD  SILVER HYDROGEL GEL Apply 1 application topically as needed (for wound care).   Yes Historical Provider, MD  tamsulosin (FLOMAX) 0.4 MG CAPS capsule Take 0.4 mg by mouth every evening. 5pm    Yes Historical Provider, MD  warfarin (COUMADIN) 2 MG tablet Take 2 mg by mouth at bedtime.   Yes Historical Provider, MD    Physical Exam: Vitals:   07/24/16 2200 07/24/16 2230 07/24/16 2300 07/24/16 2326  BP: 135/73 131/60 130/62   Pulse:      Resp: 21 16 26    Temp:      TempSrc:      SpO2:    94%  Weight:          Constitutional: No acute respiratory distress, calm, comfortable,  cachectic  Eyes: PERTLA, lids and conjunctivae normal ENMT: Mucous membranes are moist. Posterior pharynx clear of any exudate or lesions.   Neck: normal, supple, no masses, no thyromegaly Respiratory: Coarse rhonchi over left base and mid-lung. Productive cough observed. No wheeze. Normal respiratory effort.   Cardiovascular: Rate ~80 and irregular with grade 4 holosystolic murmur at upper sternal borders. No extremity edema. No significant JVD. Abdomen: No distension, no tenderness, no masses palpated. Bowel sounds active.  Musculoskeletal: no clubbing / cyanosis. No joint deformity upper and lower extremities. Normal muscle tone.  Skin: Skin tear left arm. Poor turgor. Skin otherwise warm, dry, well-perfused. Neurologic: No gross facial asymmetry, PERRL, moves all extremities equally, grip strength 5/5 bilaterally.  Psychiatric:  Alert and oriented to person and place; not oriented to month, year or situation. Very pleasant and cooperative.     Labs on Admission: I have personally reviewed following labs and imaging studies  CBC:  Recent Labs Lab 07/24/16 1914  WBC 19.8*  HGB 10.4*  HCT 32.1*  MCV 92.5  PLT [id number]   Basic Metabolic Panel:  Recent Labs Lab 07/19/16 07/24/16 1914  NA 145 142  K 3.7 3.5  CL  --  109  CO2  --  26  GLUCOSE  --  142*  BUN 39* 46*  CREATININE 1.8* 2.16*  CALCIUM  --  9.0   GFR: Estimated Creatinine Clearance: 15.5 mL/min (by C-G formula based on SCr of 2.16 mg/dL (H)). Liver Function Tests:  Recent Labs Lab 07/24/16 1914  AST 14*  ALT 12*  ALKPHOS 99  BILITOT 0.6  PROT 6.2*  ALBUMIN 3.0*    Recent Labs Lab 07/24/16 1914  LIPASE 21   No results for input(s): AMMONIA in the last 168 hours. Coagulation Profile:  Recent Labs Lab 07/24/16 2009  INR 1.61   Cardiac Enzymes: No results for input(s): CKTOTAL, CKMB, CKMBINDEX, TROPONINI in the last 168 hours. BNP (last 3 results) No results for input(s): PROBNP in the last 8760  hours. HbA1C: No results for input(s): HGBA1C in the last 72 hours. CBG: No results for input(s): GLUCAP in the last 168 hours. Lipid Profile: No results for input(s): CHOL, HDL, LDLCALC, TRIG, CHOLHDL, LDLDIRECT in the last 72 hours. Thyroid Function Tests: No results for input(s): TSH, T4TOTAL, FREET4, T3FREE, THYROIDAB in the last 72 hours. Anemia Panel: No results for  input(s): VITAMINB12, FOLATE, FERRITIN, TIBC, IRON, RETICCTPCT in the last 72 hours. Urine analysis:    Component Value Date/Time   COLORURINE YELLOW 09/03/2014 2025   APPEARANCEUR CLEAR 09/03/2014 2025   LABSPEC 1.016 09/03/2014 2025   PHURINE 6.5 09/03/2014 2025   GLUCOSEU NEGATIVE 09/03/2014 2025   HGBUR NEGATIVE 09/03/2014 2025   BILIRUBINUR NEGATIVE 09/03/2014 2025   KETONESUR NEGATIVE 09/03/2014 2025   PROTEINUR 30 (A) 09/03/2014 2025   UROBILINOGEN 0.2 09/03/2014 2025   NITRITE NEGATIVE 09/03/2014 2025   LEUKOCYTESUR NEGATIVE 09/03/2014 2025   Sepsis Labs: @LABRCNTIP (procalcitonin:4,lacticidven:4) )No results found for this or any previous visit (from the past 240 hour(s)).   Radiological Exams on Admission: Dg Chest 2 View  Result Date: 07/24/2016 CLINICAL DATA:  Elevated white blood cell count. EXAM: CHEST  2 VIEW COMPARISON:  Chest x-ray dated 06/09/2016. FINDINGS: Mild cardiomegaly is stable. Median sternotomy wires appear intact and stable in alignment. Lungs are at least mildly hyperexpanded suggesting COPD. Coarse interstitial markings noted bilaterally, not significantly changed compared to previous exams suggesting chronic scarring/fibrosis. Slightly more confluent opacity is now seen at the left lung base. No pleural effusion or pneumothorax seen. IMPRESSION: 1. Increased opacity at the left lung base which could represent pneumonia, atelectasis or mild edema. If any associated fever, would favor pneumonia. 2. Hyperexpanded lungs suggesting some degree of COPD/emphysema. 3. Stable cardiomegaly.  No  evidence of active CHF. Electronically Signed   By: Franki Cabot M.D.   On: 07/24/2016 21:07    EKG: Not performed, will obtain as appropriate.   Assessment/Plan  1. HCAP - Presents from nursing home for evaluation of leukocytosis to 23,200 at the facility with non-bloody diarrhea  - WBC is 19,800 here with productive cough and left-sided rhonchi noted and corresponding infiltrate on CXR  - Blood cultures obtained in ED, sputum culture and strep pneumo urine antigen ordered  - Given recent hospitalization and residence at nursing home, will start empiric treatment with vancomycin and cefepime  - Supportive care with prn supplemental O2, antipyretic, analgesic, mucolytic   2. Diarrhea  - Pt presents with 2 days of non-bloody diarrhea; there have been other cases of acute diarrhea at his nursing home in recent days - GI panel and C diff PCR pending; will maintain enteric precautions pending results  - Replace volume and electrolytes as needed; given 1 liter NS en route and another in ED   3. Acute kidney injury superimposed on CKD stage III  - SCr is 2.16 on admission, up from apparent baseline of 1.5 - Likely a prerenal azotemia in setting of diarrhea and PNA  - He was given 1 L NS en route and another in ED; continue gentile IVF hydration overnight  - Avoid nephrotoxins where possible, renally-dose medications  - Repeat chem panel in am   4. Atrial fibrillation, chronic  - CHADS-VASc at least 4 (age x2, HTN, CAD)  - Continue warfarin; INR 1.61 on admission; warfarin dose recently lowered at his nursing home for successive INR's >3  - Continue metoprolol as tolerated  5. Hypertension  - BP was soft initially in ED, improved/normalized with IVF  - Norvasc held on admission d/t soft BP; continue Coreg and Bidil as tolerated   6. Normocytic anemia  - Hgb is 10.4 on admission and stable relative to recent priors and with no s/s of bleeding  - He suffered post-operative anemia after  hip replacement 6 wks ago and received 2 units pRBCs at that time   DVT  prophylaxis: warfarin  Code Status: Full  Family Communication: Discussed with patient Disposition Plan: Observe on med-surg Consults called: None Admission status: Observation    Vianne Bulls, MD [facility] Pager [phone]  If 7PM-7AM, please contact night-coverage [email or web address] Password [facility]  07/24/2016, 11:31 PM

## 2016-07-24 NOTE — ED Notes (Signed)
Bed: WA09 Expected date:  Expected time:  Means of arrival:  Comments: EMS 

## 2016-07-25 ENCOUNTER — Encounter (HOSPITAL_COMMUNITY): Payer: Self-pay

## 2016-07-25 DIAGNOSIS — N183 Chronic kidney disease, stage 3 (moderate): Secondary | ICD-10-CM | POA: Diagnosis not present

## 2016-07-25 DIAGNOSIS — F339 Major depressive disorder, recurrent, unspecified: Secondary | ICD-10-CM | POA: Diagnosis present

## 2016-07-25 DIAGNOSIS — G309 Alzheimer's disease, unspecified: Secondary | ICD-10-CM | POA: Diagnosis present

## 2016-07-25 DIAGNOSIS — R2689 Other abnormalities of gait and mobility: Secondary | ICD-10-CM | POA: Diagnosis not present

## 2016-07-25 DIAGNOSIS — L89152 Pressure ulcer of sacral region, stage 2: Secondary | ICD-10-CM | POA: Diagnosis present

## 2016-07-25 DIAGNOSIS — I482 Chronic atrial fibrillation: Secondary | ICD-10-CM | POA: Diagnosis not present

## 2016-07-25 DIAGNOSIS — R531 Weakness: Secondary | ICD-10-CM | POA: Diagnosis not present

## 2016-07-25 DIAGNOSIS — Z951 Presence of aortocoronary bypass graft: Secondary | ICD-10-CM | POA: Diagnosis not present

## 2016-07-25 DIAGNOSIS — M6281 Muscle weakness (generalized): Secondary | ICD-10-CM | POA: Diagnosis not present

## 2016-07-25 DIAGNOSIS — Z7901 Long term (current) use of anticoagulants: Secondary | ICD-10-CM | POA: Diagnosis not present

## 2016-07-25 DIAGNOSIS — F0280 Dementia in other diseases classified elsewhere without behavioral disturbance: Secondary | ICD-10-CM | POA: Diagnosis present

## 2016-07-25 DIAGNOSIS — D649 Anemia, unspecified: Secondary | ICD-10-CM | POA: Diagnosis present

## 2016-07-25 DIAGNOSIS — R2681 Unsteadiness on feet: Secondary | ICD-10-CM | POA: Diagnosis not present

## 2016-07-25 DIAGNOSIS — Z8546 Personal history of malignant neoplasm of prostate: Secondary | ICD-10-CM | POA: Diagnosis not present

## 2016-07-25 DIAGNOSIS — R791 Abnormal coagulation profile: Secondary | ICD-10-CM

## 2016-07-25 DIAGNOSIS — Z79899 Other long term (current) drug therapy: Secondary | ICD-10-CM | POA: Diagnosis not present

## 2016-07-25 DIAGNOSIS — J690 Pneumonitis due to inhalation of food and vomit: Secondary | ICD-10-CM | POA: Diagnosis present

## 2016-07-25 DIAGNOSIS — Y95 Nosocomial condition: Secondary | ICD-10-CM | POA: Diagnosis present

## 2016-07-25 DIAGNOSIS — Z96642 Presence of left artificial hip joint: Secondary | ICD-10-CM | POA: Diagnosis present

## 2016-07-25 DIAGNOSIS — Z8673 Personal history of transient ischemic attack (TIA), and cerebral infarction without residual deficits: Secondary | ICD-10-CM | POA: Diagnosis not present

## 2016-07-25 DIAGNOSIS — R1312 Dysphagia, oropharyngeal phase: Secondary | ICD-10-CM | POA: Diagnosis not present

## 2016-07-25 DIAGNOSIS — R197 Diarrhea, unspecified: Secondary | ICD-10-CM | POA: Diagnosis not present

## 2016-07-25 DIAGNOSIS — J189 Pneumonia, unspecified organism: Secondary | ICD-10-CM | POA: Diagnosis not present

## 2016-07-25 DIAGNOSIS — R488 Other symbolic dysfunctions: Secondary | ICD-10-CM | POA: Diagnosis not present

## 2016-07-25 DIAGNOSIS — I2510 Atherosclerotic heart disease of native coronary artery without angina pectoris: Secondary | ICD-10-CM | POA: Diagnosis present

## 2016-07-25 DIAGNOSIS — N179 Acute kidney failure, unspecified: Secondary | ICD-10-CM | POA: Diagnosis not present

## 2016-07-25 DIAGNOSIS — I129 Hypertensive chronic kidney disease with stage 1 through stage 4 chronic kidney disease, or unspecified chronic kidney disease: Secondary | ICD-10-CM | POA: Diagnosis present

## 2016-07-25 DIAGNOSIS — E860 Dehydration: Secondary | ICD-10-CM | POA: Diagnosis present

## 2016-07-25 DIAGNOSIS — I959 Hypotension, unspecified: Secondary | ICD-10-CM | POA: Diagnosis present

## 2016-07-25 DIAGNOSIS — M6289 Other specified disorders of muscle: Secondary | ICD-10-CM | POA: Diagnosis not present

## 2016-07-25 LAB — BASIC METABOLIC PANEL
Anion gap: 5 (ref 5–15)
BUN: 42 mg/dL — ABNORMAL HIGH (ref 6–20)
CHLORIDE: 113 mmol/L — AB (ref 101–111)
CO2: 24 mmol/L (ref 22–32)
Calcium: 8.5 mg/dL — ABNORMAL LOW (ref 8.9–10.3)
Creatinine, Ser: 1.7 mg/dL — ABNORMAL HIGH (ref 0.61–1.24)
GFR calc Af Amer: 38 mL/min — ABNORMAL LOW (ref >60)
GFR calc non Af Amer: 32 mL/min — ABNORMAL LOW (ref >60)
GLUCOSE: 105 mg/dL — AB (ref 65–99)
POTASSIUM: 3.5 mmol/L (ref 3.5–5.1)
Sodium: 142 mmol/L (ref 135–145)

## 2016-07-25 LAB — URINALYSIS, ROUTINE W REFLEX MICROSCOPIC
Bilirubin Urine: NEGATIVE
Glucose, UA: NEGATIVE mg/dL
KETONES UR: NEGATIVE mg/dL
Nitrite: NEGATIVE
PH: 5 (ref 5.0–8.0)
Protein, ur: NEGATIVE mg/dL
Specific Gravity, Urine: 1.015 (ref 1.005–1.030)

## 2016-07-25 LAB — FOLATE: FOLATE: 19.7 ng/mL (ref >5.9)

## 2016-07-25 LAB — IRON AND TIBC
IRON: 18 ug/dL — AB (ref 45–182)
SATURATION RATIOS: 11 % — AB (ref 17.9–39.5)
TIBC: 161 ug/dL — AB (ref 250–450)
UIBC: 143 ug/dL

## 2016-07-25 LAB — CBC
HCT: 26.7 % — ABNORMAL LOW (ref 39.0–52.0)
Hemoglobin: 8.7 g/dL — ABNORMAL LOW (ref 13.0–17.0)
MCH: 30.9 pg (ref 26.0–34.0)
MCHC: 32.6 g/dL (ref 30.0–36.0)
MCV: 94.7 fL (ref 78.0–100.0)
Platelets: 154 10*3/uL (ref 150–400)
RBC: 2.82 MIL/uL — ABNORMAL LOW (ref 4.22–5.81)
RDW: 16.3 % — AB (ref 11.5–15.5)
WBC: 13.6 10*3/uL — ABNORMAL HIGH (ref 4.0–10.5)

## 2016-07-25 LAB — FERRITIN: Ferritin: 173 ng/mL (ref 24–336)

## 2016-07-25 LAB — MAGNESIUM: Magnesium: 1.7 mg/dL (ref 1.7–2.4)

## 2016-07-25 LAB — RETICULOCYTES
RBC.: 2.81 MIL/uL — AB (ref 4.22–5.81)
Retic Count, Absolute: 25.3 10*3/uL (ref 19.0–186.0)
Retic Ct Pct: 0.9 % (ref 0.4–3.1)

## 2016-07-25 LAB — VITAMIN B12: VITAMIN B 12: 549 pg/mL (ref 180–914)

## 2016-07-25 LAB — GLUCOSE, CAPILLARY: Glucose-Capillary: 136 mg/dL — ABNORMAL HIGH (ref 65–99)

## 2016-07-25 LAB — PROTIME-INR
INR: 1.74
Prothrombin Time: 20.5 seconds — ABNORMAL HIGH (ref 11.4–15.2)

## 2016-07-25 LAB — MRSA PCR SCREENING: MRSA by PCR: POSITIVE — AB

## 2016-07-25 LAB — STREP PNEUMONIAE URINARY ANTIGEN: Strep Pneumo Urinary Antigen: NEGATIVE

## 2016-07-25 LAB — PROCALCITONIN: Procalcitonin: 0.4 ng/mL

## 2016-07-25 LAB — LACTIC ACID, PLASMA: Lactic Acid, Venous: 1.1 mmol/L (ref 0.5–1.9)

## 2016-07-25 MED ORDER — POTASSIUM CHLORIDE 20 MEQ PO PACK
40.0000 meq | PACK | Freq: Once | ORAL | Status: DC
  Filled 2016-07-25: qty 2

## 2016-07-25 MED ORDER — SODIUM CHLORIDE 0.9 % IV SOLN
INTRAVENOUS | Status: AC
  Administered 2016-07-25 – 2016-07-26 (×3): via INTRAVENOUS
  Administered 2016-07-26: 100 mL/h via INTRAVENOUS
  Administered 2016-07-27: 10:00:00 via INTRAVENOUS

## 2016-07-25 MED ORDER — IPRATROPIUM-ALBUTEROL 0.5-2.5 (3) MG/3ML IN SOLN
3.0000 mL | Freq: Four times a day (QID) | RESPIRATORY_TRACT | Status: DC
  Administered 2016-07-25: 3 mL via RESPIRATORY_TRACT

## 2016-07-25 MED ORDER — POTASSIUM CHLORIDE CRYS ER 20 MEQ PO TBCR
40.0000 meq | EXTENDED_RELEASE_TABLET | Freq: Once | ORAL | Status: AC
  Administered 2016-07-25: 40 meq via ORAL
  Filled 2016-07-25: qty 2

## 2016-07-25 MED ORDER — FAMOTIDINE 20 MG PO TABS
20.0000 mg | ORAL_TABLET | Freq: Every day | ORAL | Status: DC
  Administered 2016-07-26 – 2016-07-28 (×3): 20 mg via ORAL
  Filled 2016-07-25 (×3): qty 1

## 2016-07-25 MED ORDER — IPRATROPIUM-ALBUTEROL 0.5-2.5 (3) MG/3ML IN SOLN
3.0000 mL | Freq: Three times a day (TID) | RESPIRATORY_TRACT | Status: DC
  Administered 2016-07-26 – 2016-07-27 (×5): 3 mL via RESPIRATORY_TRACT
  Filled 2016-07-25 (×5): qty 3

## 2016-07-25 MED ORDER — MUPIROCIN 2 % EX OINT
1.0000 "application " | TOPICAL_OINTMENT | Freq: Two times a day (BID) | CUTANEOUS | Status: DC
  Administered 2016-07-25 – 2016-07-28 (×6): 1 via NASAL
  Filled 2016-07-25 (×2): qty 22

## 2016-07-25 MED ORDER — CHLORHEXIDINE GLUCONATE CLOTH 2 % EX PADS
6.0000 | MEDICATED_PAD | Freq: Every day | CUTANEOUS | Status: DC
  Administered 2016-07-26 – 2016-07-28 (×3): 6 via TOPICAL

## 2016-07-25 MED ORDER — WARFARIN SODIUM 2 MG PO TABS
2.0000 mg | ORAL_TABLET | Freq: Once | ORAL | Status: AC
  Administered 2016-07-25: 2 mg via ORAL
  Filled 2016-07-25 (×2): qty 1

## 2016-07-25 MED ORDER — BUDESONIDE 0.25 MG/2ML IN SUSP
0.2500 mg | Freq: Two times a day (BID) | RESPIRATORY_TRACT | Status: DC
  Administered 2016-07-26 – 2016-07-28 (×5): 0.25 mg via RESPIRATORY_TRACT
  Filled 2016-07-25 (×4): qty 2

## 2016-07-25 NOTE — Progress Notes (Signed)
Daughter came to visit with patient today and requested that pt be discharged to [facility] if at all possible. [place]

## 2016-07-25 NOTE — Progress Notes (Signed)
ANTICOAGULATION CONSULT NOTE - Follow Up  Pharmacy Consult for Warfarin Indication: atrial fibrillation  No Known Allergies  Patient Measurements: Height: 5\' 6"  (167.6 cm) Weight: 124 lb 1.9 oz (56.3 kg) IBW/kg (Calculated) : 63.8  Vital Signs: Temp: 98.3 F (36.8 C) (01/16 0524) Temp Source: Oral (01/16 0524) BP: 109/74 (01/16 0801) Pulse Rate: 86 (01/16 0801)  Labs:  Recent Labs  07/24/16 1914 07/24/16 2009 07/25/16 0501  HGB 10.4*  --  8.7*  HCT 32.1*  --  26.7*  PLT 182  --  154  LABPROT  --  19.4* 20.5*  INR  --  1.61 1.74  CREATININE 2.16*  --  1.70*    Estimated Creatinine Clearance: 20.7 mL/min (by C-G formula based on SCr of 1.7 mg/dL (H)).   Medical History: Past Medical History:  Diagnosis Date  . Alzheimer's disease, focal onset   . Atrial fibrillation ([place])   . CAD (coronary artery disease)   . Chronic atrial fibrillation ([place])   . High blood pressure   . Low back pain   . Memory loss   . Prostate cancer ([place])   . Recurrent major depression ([place])   . Stroke [facility])      Assessment: 83 yr male admitted with pneumonia and diarrhea. PMH significant for AFib (on warfarin), HTN and CKD. Pharmacy consulted to dose warfarin. PTA on warfarin 2mg  daily - Last dose on 1/14, INR upon admission = 1.61. Of note, warfarin dose recently lowered at his nursing home for successive INR's >3.   Today, 07/25/2016  INR 1.74  Hgb decreased 8.7, Plts low at 154  No bleeding reported  Regular diet + Ensure TID ordered  No drug interactions other than broad-spectrum antibiotics noted  Goal of Therapy:  INR 2-3   Plan:   Warfarin 2mg  po x 1 dose tonight  Check daily PT/INR   Peggyann Juba, PharmD, BCPS Pager: [phone] 07/25/2016,9:51 AM

## 2016-07-25 NOTE — Progress Notes (Signed)
PROGRESS NOTE    Kurt Thomas  [id number] DOB: December 20, 1918 DOA: 07/24/2016 PCP: Melinda Crutch, MD    Brief Narrative:  Patient is a 83 year old gentleman history of Alzheimer's dementia, chronic age of fibrillation on Coumadin, hypertension, chronic kidney disease stage III presented from nursing home for evaluation for diarrhea and leukocytosis. Patient noted to have a probable pneumonia on chest x-ray and noted to be hypotensive on admission. Patient placed on empiric IV antibiotics of vancomycin and cefepime. Patient pancultured.   Assessment & Plan:   Principal Problem:   HCAP (healthcare-associated pneumonia) Active Problems:   Atrial fibrillation ([place])   Essential hypertension   CKD (chronic kidney disease) stage 3, GFR 30-59 ml/min   CAD (coronary artery disease)   AKI (acute kidney injury) ([place])   Diarrhea   Normocytic anemia   Subtherapeutic international normalized ratio (INR)  #1 healthcare associated pneumonia Per chest x-ray. Patient afebrile. Patient with a leukocytosis that is trending down. MRSA PCR positive. Blood cultures pending. Continue empiric IV vancomycin and IV cefepime. IV fluids. Will add duo nebs and Pulmicort. Follow.  #2 dehydration IV fluids.  #3 subtherapeutic INR Coumadin per pharmacy.  #4 diarrhea Her nursing no further loose stools during this hospitalization. Stool studies pending. GI panel is C. difficile PCR pending. Continue enteric precautions. IV fluids. Supportive care.  #5 acute on chronic kidney disease stage III Creatinine was 2.16 on admission for a baseline of a proximal 1.5. Likely secondary to prerenal resulting in the setting of diarrhea and pneumonia. Creatinine trending down. Continue IV fluids. Follow.  #6 anemia Patient with no overt bleeding. Check an anemia panel. Follow H&H.  #7 chronic atrial fibrillation [id number] SCORE 4 Continue Coreg for rate control. INR is subtherapeutic. Coumadin per pharmacy.  #8  hypertension Blood pressure borderline. IV fluids. Continue Coreg and BiDil. Continue to hold other antihypertensive medications.    DVT prophylaxis: SCDs Code Status: Full Family Communication: Updated patient. No family at bedside. Disposition Plan: Once diarrhea resolved and on oral antibiotics, with resolution of dehydration.   Consultants:   None  Procedures:   Chest x-ray 07/24/2016    Antimicrobials:   IV cefepime 07/25/2016  IV vancomycin 07/24/2016   Subjective: Patient sleeping easily arousable. Patient denies any chest pain. No shortness of breath. No further loose stools per nursing.  Objective: Vitals:   07/24/16 2326 07/24/16 2349 07/25/16 0524 07/25/16 0801  BP:  120/87 (!) 98/55 109/74  Pulse:  79 78 86  Resp:  18 16   Temp:  97.8 F (36.6 C) 98.3 F (36.8 C)   TempSrc:  Oral Oral   SpO2: 94% 97% 96%   Weight:  54.2 kg (119 lb 7.8 oz) 56.3 kg (124 lb 1.9 oz)   Height:  5\' 6"  (1.676 m)      Intake/Output Summary (Last 24 hours) at 07/25/16 1338 Last data filed at 07/25/16 1000  Gross per 24 hour  Intake              275 ml  Output                0 ml  Net              275 ml   Filed Weights   07/24/16 1957 07/24/16 2349 07/25/16 0524  Weight: 53.5 kg (118 lb) 54.2 kg (119 lb 7.8 oz) 56.3 kg (124 lb 1.9 oz)    Examination:  General exam: Appears calm and comfortable. Extremely dry mucous membranes  Respiratory  system: Coarse breath sounds on the left. Minimal to mild expiratory wheezing. Respiratory effort normal. Cardiovascular system: S1 & S2 heard, RRR. No JVD, murmurs, rubs, gallops or clicks. No pedal edema. Gastrointestinal system: Abdomen is nondistended, soft and nontender. No organomegaly or masses felt. Normal bowel sounds heard. Central nervous system: Alert and oriented. No focal neurological deficits. Extremities: Symmetric 5 x 5 power. Skin: No rashes, lesions or ulcers Psychiatry: Judgement and insight appear normal. Mood  & affect appropriate.     Data Reviewed: I have personally reviewed following labs and imaging studies  CBC:  Recent Labs Lab 07/24/16 1914 07/25/16 0501  WBC 19.8* 13.6*  HGB 10.4* 8.7*  HCT 32.1* 26.7*  MCV 92.5 94.7  PLT 182 [id number]   Basic Metabolic Panel:  Recent Labs Lab 07/19/16 07/24/16 1914 07/25/16 0501  NA 145 142 142  K 3.7 3.5 3.5  CL  --  109 113*  CO2  --  26 24  GLUCOSE  --  142* 105*  BUN 39* 46* 42*  CREATININE 1.8* 2.16* 1.70*  CALCIUM  --  9.0 8.5*  MG  --   --  1.7   GFR: Estimated Creatinine Clearance: 20.7 mL/min (by C-G formula based on SCr of 1.7 mg/dL (H)). Liver Function Tests:  Recent Labs Lab 07/24/16 1914  AST 14*  ALT 12*  ALKPHOS 99  BILITOT 0.6  PROT 6.2*  ALBUMIN 3.0*    Recent Labs Lab 07/24/16 1914  LIPASE 21   No results for input(s): AMMONIA in the last 168 hours. Coagulation Profile:  Recent Labs Lab 07/24/16 2009 07/25/16 0501  INR 1.61 1.74   Cardiac Enzymes: No results for input(s): CKTOTAL, CKMB, CKMBINDEX, TROPONINI in the last 168 hours. BNP (last 3 results) No results for input(s): PROBNP in the last 8760 hours. HbA1C: No results for input(s): HGBA1C in the last 72 hours. CBG:  Recent Labs Lab 07/25/16 0823  GLUCAP 136*   Lipid Profile: No results for input(s): CHOL, HDL, LDLCALC, TRIG, CHOLHDL, LDLDIRECT in the last 72 hours. Thyroid Function Tests: No results for input(s): TSH, T4TOTAL, FREET4, T3FREE, THYROIDAB in the last 72 hours. Anemia Panel:  Recent Labs  07/25/16 0501 07/25/16 0944  VITAMINB12  --  549  FOLATE  --  19.7  FERRITIN  --  173  TIBC  --  161*  IRON  --  18*  RETICCTPCT 0.9  --    Sepsis Labs:  Recent Labs Lab 07/24/16 2021 07/24/16 2355  PROCALCITON  --  0.40  LATICACIDVEN 1.14 1.1    No results found for this or any previous visit (from the past 240 hour(s)).       Radiology Studies: Dg Chest 2 View  Result Date: 07/24/2016 CLINICAL DATA:   Elevated white blood cell count. EXAM: CHEST  2 VIEW COMPARISON:  Chest x-ray dated 06/09/2016. FINDINGS: Mild cardiomegaly is stable. Median sternotomy wires appear intact and stable in alignment. Lungs are at least mildly hyperexpanded suggesting COPD. Coarse interstitial markings noted bilaterally, not significantly changed compared to previous exams suggesting chronic scarring/fibrosis. Slightly more confluent opacity is now seen at the left lung base. No pleural effusion or pneumothorax seen. IMPRESSION: 1. Increased opacity at the left lung base which could represent pneumonia, atelectasis or mild edema. If any associated fever, would favor pneumonia. 2. Hyperexpanded lungs suggesting some degree of COPD/emphysema. 3. Stable cardiomegaly.  No evidence of active CHF. Electronically Signed   By: Franki Cabot M.D.   On: 07/24/2016 21:07  Scheduled Meds: . brimonidine  1 drop Both Eyes BID   And  . timolol  1 drop Both Eyes BID  . carvedilol  12.5 mg Oral BID WC  . ceFEPime (MAXIPIME) IV  1 g Intravenous Q24H  . DULoxetine  30 mg Oral QPM  . [START ON 07/26/2016] famotidine  20 mg Oral Daily  . feeding supplement (ENSURE ENLIVE)  120 mL Oral TID BM  . feeding supplement (ENSURE ENLIVE)  237 mL Oral Q lunch  . feeding supplement (PRO-STAT SUGAR FREE 64)  30 mL Oral BID  . ferrous Q000111Q C-folic acid  1 capsule Oral Q breakfast  . fluticasone  2 spray Each Nare QHS  . isosorbide-hydrALAZINE  1 tablet Oral BID  . latanoprost  1 drop Both Eyes QHS  . mirabegron ER  25 mg Oral Q breakfast  . mirtazapine  15 mg Oral QHS  . tamsulosin  0.4 mg Oral QPM  . [START ON 07/26/2016] vancomycin  750 mg Intravenous Q48H  . warfarin  2 mg Oral ONCE-1800  . Warfarin - Pharmacist Dosing Inpatient   Does not apply q1800   Continuous Infusions:   LOS: 0 days    Time spent: 58 minutes    Broden Holt, MD [facility] Pager [phone]  If 7PM-7AM, please contact  night-coverage [email or web address] Password [email or web address] 07/25/2016, 1:38 PM

## 2016-07-26 DIAGNOSIS — R197 Diarrhea, unspecified: Secondary | ICD-10-CM

## 2016-07-26 DIAGNOSIS — I482 Chronic atrial fibrillation: Secondary | ICD-10-CM

## 2016-07-26 DIAGNOSIS — J189 Pneumonia, unspecified organism: Principal | ICD-10-CM

## 2016-07-26 DIAGNOSIS — N179 Acute kidney failure, unspecified: Secondary | ICD-10-CM

## 2016-07-26 DIAGNOSIS — I10 Essential (primary) hypertension: Secondary | ICD-10-CM

## 2016-07-26 LAB — URINE CULTURE: Culture: <10000 — AB

## 2016-07-26 LAB — CBC
HEMATOCRIT: 26.9 % — AB (ref 39.0–52.0)
Hemoglobin: 8.8 g/dL — ABNORMAL LOW (ref 13.0–17.0)
MCH: 31 pg (ref 26.0–34.0)
MCHC: 32.7 g/dL (ref 30.0–36.0)
MCV: 94.7 fL (ref 78.0–100.0)
Platelets: 156 10*3/uL (ref 150–400)
RBC: 2.84 MIL/uL — ABNORMAL LOW (ref 4.22–5.81)
RDW: 16.2 % — AB (ref 11.5–15.5)
WBC: 8.6 10*3/uL (ref 4.0–10.5)

## 2016-07-26 LAB — BASIC METABOLIC PANEL
Anion gap: 6 (ref 5–15)
BUN: 34 mg/dL — AB (ref 6–20)
CALCIUM: 8.8 mg/dL — AB (ref 8.9–10.3)
CO2: 21 mmol/L — ABNORMAL LOW (ref 22–32)
CREATININE: 1.47 mg/dL — AB (ref 0.61–1.24)
Chloride: 114 mmol/L — ABNORMAL HIGH (ref 101–111)
GFR calc non Af Amer: 39 mL/min — ABNORMAL LOW (ref >60)
GFR, EST AFRICAN AMERICAN: 45 mL/min — AB (ref >60)
Glucose, Bld: 95 mg/dL (ref 65–99)
Potassium: 3.7 mmol/L (ref 3.5–5.1)
SODIUM: 141 mmol/L (ref 135–145)

## 2016-07-26 LAB — PROTIME-INR
INR: 1.74
PROTHROMBIN TIME: 20.6 s — AB (ref 11.4–15.2)

## 2016-07-26 LAB — MAGNESIUM: Magnesium: 1.7 mg/dL (ref 1.7–2.4)

## 2016-07-26 LAB — GLUCOSE, CAPILLARY: GLUCOSE-CAPILLARY: 89 mg/dL (ref 65–99)

## 2016-07-26 MED ORDER — WARFARIN SODIUM 3 MG PO TABS
3.0000 mg | ORAL_TABLET | Freq: Once | ORAL | Status: AC
  Administered 2016-07-26: 3 mg via ORAL
  Filled 2016-07-26: qty 1

## 2016-07-26 MED ORDER — VANCOMYCIN HCL IN DEXTROSE 750-5 MG/150ML-% IV SOLN
750.0000 mg | INTRAVENOUS | Status: DC
  Administered 2016-07-26 – 2016-07-27 (×2): 750 mg via INTRAVENOUS
  Filled 2016-07-26 (×3): qty 150

## 2016-07-26 NOTE — Progress Notes (Signed)
PROGRESS NOTE    Kurt Thomas  [id number] DOB: 05-30-1919 DOA: 07/24/2016 PCP: Melinda Crutch, MD    Brief Narrative:  Kurt Thomas is a 83 y.o. male with medical history significant for Alzheimer dementia, chronic atrial fibrillation on warfarin, hypertension, and chronic kidney disease stage III who presents from his nursing facility for evaluation of diarrhea and leukocytosis, associated with productive cough. He waas also found to be hypotensive, admitted for left sided health care associated pneumonia.   Assessment & Plan:   Principal Problem:   HCAP (healthcare-associated pneumonia) Active Problems:   Atrial fibrillation ([place])   Essential hypertension   CKD (chronic kidney disease) stage 3, GFR 30-59 ml/min   CAD (coronary artery disease)   AKI (acute kidney injury) ([place])   Diarrhea   Normocytic anemia   Subtherapeutic international normalized ratio (INR)   PNA (pneumonia)   Left sided HCAP: Admitted for IV antibiotics.  Urine for legionella pending.  On RA with good oxygen sats.  Duo nebs as needed.  Blood cultures done and pending. Afebrile today and leukocytosis resolved.    Anemia , normocytic, anemia panel shows low iron levels.  Add iron supplements on discharge.    Acute renal failure on stage 3 ckd : suspect from dehydration and poor po intake.  Gentle hydration with IV fluids and repeat renal parameters in am.    Diarrhea: resolved spontaneously.    Chronic atrial fibrillation: rate controlled. INR sub therapeutic.   Hypertension:  Well controlled.      DVT prophylaxis: coumadin. Code Status: (Full) Family Communication: none at bedside.  Disposition Plan: pending further eval.    Consultants:  None.   Procedures: none.   Antimicrobials: vancomycin and cefepime from 1/16   Subjective: No new complaints. Reports no nausea or vomiting. No BM yet.  Objective: Vitals:   07/25/16 2130 07/25/16 2151 07/26/16 0505 07/26/16 0925  BP: (!) 107/55   132/76   Pulse: 67  84   Resp: 18  18   Temp: 97.5 F (36.4 C)  97.9 F (36.6 C)   TempSrc: Oral  Oral   SpO2: 100% 100% 97% 91%  Weight:   56.1 kg (123 lb 10.9 oz)   Height:        Intake/Output Summary (Last 24 hours) at 07/26/16 1309 Last data filed at 07/26/16 1041  Gross per 24 hour  Intake          2058.33 ml  Output              900 ml  Net          1158.33 ml   Filed Weights   07/24/16 2349 07/25/16 0524 07/26/16 0505  Weight: 54.2 kg (119 lb 7.8 oz) 56.3 kg (124 lb 1.9 oz) 56.1 kg (123 lb 10.9 oz)    Examination:  General exam: Appears calm and comfortable  Respiratory system: scattered wheezing, good air entry .  Cardiovascular system: S1 & S2 heard, irregular.  No JVD, murmurs, rubs, gallops or clicks. No pedal edema. Gastrointestinal system: Abdomen is nondistended, soft and nontender. No organomegaly or masses felt. Normal bowel sounds heard. Central nervous system: Alert and oriented. No focal neurological deficits. Extremities: Symmetric 5 x 5 power. Skin: No rashes, lesions or ulcers Psychiatry: Judgement and insight appear normal. Mood & affect appropriate.     Data Reviewed: I have personally reviewed following labs and imaging studies  CBC:  Recent Labs Lab 07/24/16 1914 07/25/16 0501 07/26/16 0502  WBC 19.8* 13.6* 8.6  HGB 10.4* 8.7* 8.8*  HCT 32.1* 26.7* 26.9*  MCV 92.5 94.7 94.7  PLT 182 154 [id number]   Basic Metabolic Panel:  Recent Labs Lab 07/24/16 1914 07/25/16 0501 07/26/16 0502  NA 142 142 141  K 3.5 3.5 3.7  CL 109 113* 114*  CO2 26 24 21*  GLUCOSE 142* 105* 95  BUN 46* 42* 34*  CREATININE 2.16* 1.70* 1.47*  CALCIUM 9.0 8.5* 8.8*  MG  --  1.7 1.7   GFR: Estimated Creatinine Clearance: 23.9 mL/min (by C-G formula based on SCr of 1.47 mg/dL (H)). Liver Function Tests:  Recent Labs Lab 07/24/16 1914  AST 14*  ALT 12*  ALKPHOS 99  BILITOT 0.6  PROT 6.2*  ALBUMIN 3.0*    Recent Labs Lab 07/24/16 1914  LIPASE 21     No results for input(s): AMMONIA in the last 168 hours. Coagulation Profile:  Recent Labs Lab 07/24/16 2009 07/25/16 0501 07/26/16 0502  INR 1.61 1.74 1.74   Cardiac Enzymes: No results for input(s): CKTOTAL, CKMB, CKMBINDEX, TROPONINI in the last 168 hours. BNP (last 3 results) No results for input(s): PROBNP in the last 8760 hours. HbA1C: No results for input(s): HGBA1C in the last 72 hours. CBG:  Recent Labs Lab 07/25/16 0823 07/26/16 0800  GLUCAP 136* 89   Lipid Profile: No results for input(s): CHOL, HDL, LDLCALC, TRIG, CHOLHDL, LDLDIRECT in the last 72 hours. Thyroid Function Tests: No results for input(s): TSH, T4TOTAL, FREET4, T3FREE, THYROIDAB in the last 72 hours. Anemia Panel:  Recent Labs  07/25/16 0501 07/25/16 0944  VITAMINB12  --  549  FOLATE  --  19.7  FERRITIN  --  173  TIBC  --  161*  IRON  --  18*  RETICCTPCT 0.9  --    Sepsis Labs:  Recent Labs Lab 07/24/16 2021 07/24/16 2355  PROCALCITON  --  0.40  LATICACIDVEN 1.14 1.1    Recent Results (from the past 240 hour(s))  Blood Culture (routine x 2)     Status: None (Preliminary result)   Collection Time: 07/24/16  7:22 PM  Result Value Ref Range Status   Specimen Description BLOOD RIGHT ANTECUBITAL  Final   Special Requests BOTTLES DRAWN AEROBIC AND ANAEROBIC 5 CC  Final   Culture   Final    NO GROWTH < 24 HOURS Performed at [facility], [place] [address] [place], [place] [address]    Report Status PENDING  Incomplete  Blood Culture (routine x 2)     Status: None (Preliminary result)   Collection Time: 07/24/16  8:32 PM  Result Value Ref Range Status   Specimen Description BLOOD RIGHT FOREARM  Final   Special Requests BOTTLES DRAWN AEROBIC AND ANAEROBIC 5ML  Final   Culture   Final    NO GROWTH < 24 HOURS Performed at [facility], [place] [address] [place], [place] [address]    Report Status PENDING  Incomplete  MRSA PCR Screening     Status: Abnormal   Collection  Time: 07/25/16  9:44 AM  Result Value Ref Range Status   MRSA by PCR POSITIVE (A) NEGATIVE Final    Comment:        The GeneXpert MRSA Assay (FDA approved for NASAL specimens only), is one component of a comprehensive MRSA colonization surveillance program. It is not intended to diagnose MRSA infection nor to guide or monitor treatment for MRSA infections. RESULT CALLED TO, READ BACK BY AND VERIFIED WITH: MCCLEAN,C. RN AT  1430 07/25/16 BY THOMPSON,N.          Radiology Studies: Dg Chest 2 View  Result Date: 07/24/2016 CLINICAL DATA:  Elevated white blood cell count. EXAM: CHEST  2 VIEW COMPARISON:  Chest x-ray dated 06/09/2016. FINDINGS: Mild cardiomegaly is stable. Median sternotomy wires appear intact and stable in alignment. Lungs are at least mildly hyperexpanded suggesting COPD. Coarse interstitial markings noted bilaterally, not significantly changed compared to previous exams suggesting chronic scarring/fibrosis. Slightly more confluent opacity is now seen at the left lung base. No pleural effusion or pneumothorax seen. IMPRESSION: 1. Increased opacity at the left lung base which could represent pneumonia, atelectasis or mild edema. If any associated fever, would favor pneumonia. 2. Hyperexpanded lungs suggesting some degree of COPD/emphysema. 3. Stable cardiomegaly.  No evidence of active CHF. Electronically Signed   By: Franki Cabot M.D.   On: 07/24/2016 21:07        Scheduled Meds: . brimonidine  1 drop Both Eyes BID   And  . timolol  1 drop Both Eyes BID  . budesonide (PULMICORT) nebulizer solution  0.25 mg Nebulization BID  . carvedilol  12.5 mg Oral BID WC  . ceFEPime (MAXIPIME) IV  1 g Intravenous Q24H  . Chlorhexidine Gluconate Cloth  6 each Topical Q0600  . DULoxetine  30 mg Oral QPM  . famotidine  20 mg Oral Daily  . feeding supplement (ENSURE ENLIVE)  120 mL Oral TID BM  . feeding supplement (ENSURE ENLIVE)  237 mL Oral Q lunch  . feeding supplement  (PRO-STAT SUGAR FREE 64)  30 mL Oral BID  . ferrous Q000111Q C-folic acid  1 capsule Oral Q breakfast  . fluticasone  2 spray Each Nare QHS  . ipratropium-albuterol  3 mL Nebulization TID  . isosorbide-hydrALAZINE  1 tablet Oral BID  . latanoprost  1 drop Both Eyes QHS  . mirabegron ER  25 mg Oral Q breakfast  . mirtazapine  15 mg Oral QHS  . mupirocin ointment  1 application Nasal BID  . tamsulosin  0.4 mg Oral QPM  . vancomycin  750 mg Intravenous Q24H  . warfarin  3 mg Oral ONCE-1800  . Warfarin - Pharmacist Dosing Inpatient   Does not apply q1800   Continuous Infusions: . sodium chloride 100 mL/hr (07/26/16 1126)     LOS: 1 day    Time spent: 30 minutes.     Hosie Poisson, MD [facility] Pager [phone]   If 7PM-7AM, please contact night-coverage [email or web address] Password [email or web address] 07/26/2016, 1:09 PM

## 2016-07-26 NOTE — Care Management Note (Signed)
Case Management Note  Patient Details  Name: Kurt Thomas MRN: [id number] Date of Birth: Oct 14, 1918  Subjective/Objective:                    Action/Plan: Anticipate discharge to facility when medically ready. No further CM needs but will be available should additional discharge needs arise.   Expected Discharge Date:  07/27/16               Expected Discharge Plan:  Skilled Nursing Facility  In-House Referral:  Clinical Social Work  Discharge planning Services  CM Consult  Post Acute Care Choice:  NA Choice offered to:  NA  DME Arranged:  N/A DME Agency:  NA  HH Arranged:  NA HH Agency:  NA  Status of Service:  Completed, signed off  If discussed at [employer] of Stay Meetings, dates discussed:    Additional Comments:  Delrae Sawyers, RN 07/26/2016, 12:09 PM

## 2016-07-26 NOTE — Progress Notes (Signed)
Pharmacy Antibiotic Note  Kurt Thomas is a 83 y.o. male admitted on 07/24/2016 with pneumonia.  In the ED patient received Vancomycin 1gm and Zosyn 3.375gm IV x 1 dose each.  Upon admission, Pharmacy has been consulted for Vancomycin and Cefepime dosing.  Today, 07/26/2016 Day #2 full abx - vancomycin 750mg  q48h / cefepime 1g q24h Afebrile WBC improved to normal AKI - SCr improved to baseline ~1.5, CrCl 24 ml/min  Plan:  Increase Vancomycin 750mg  IV q24h following 1g load on 1/15 at 22:00  Check trough at steady state, goal 15-20 mcg/ml  Continue Cefepime 1gm IV q24h  F/U renal function, clinical course, culture sensitivities  Height: 5\' 6"  (167.6 cm) Weight: 123 lb 10.9 oz (56.1 kg) IBW/kg (Calculated) : 63.8  Temp (24hrs), Avg:97.8 F (36.6 C), Min:97.5 F (36.4 C), Max:97.9 F (36.6 C)   Recent Labs Lab 07/24/16 1914 07/24/16 2021 07/24/16 2355 07/25/16 0501 07/26/16 0502  WBC 19.8*  --   --  13.6* 8.6  CREATININE 2.16*  --   --  1.70* 1.47*  LATICACIDVEN  --  1.14 1.1  --   --     Estimated Creatinine Clearance: 23.9 mL/min (by C-G formula based on SCr of 1.47 mg/dL (H)).    No Known Allergies  Antimicrobials this admission:  1/15 Zosyn x 1 1/15 Vanc >> 1/16 Cefepime >>    Dose adjustments this admission:  1/17 increase vancomycin from 750mg  q48h to q24h   Microbiology results:  1/15 BCx: ngtd 1/15 UCx: sent  1/15 Sputum Cx: sent 1/15 CDiff: sent 1/16 GI panel: sent 1/16 MRSA PCR: POSITIVE   Thank you for allowing pharmacy to be a part of this patient's care.  Peggyann Juba, PharmD, BCPS Pager: [phone] 07/26/2016 11:16 AM

## 2016-07-26 NOTE — Progress Notes (Signed)
ANTICOAGULATION CONSULT NOTE - Follow Up  Pharmacy Consult for Warfarin Indication: atrial fibrillation  No Known Allergies  Patient Measurements: Height: 5\' 6"  (167.6 cm) Weight: 123 lb 10.9 oz (56.1 kg) IBW/kg (Calculated) : 63.8  Vital Signs: Temp: 97.9 F (36.6 C) (01/17 0505) Temp Source: Oral (01/17 0505) BP: 132/76 (01/17 0505) Pulse Rate: 84 (01/17 0505)  Labs:  Recent Labs  07/24/16 1914 07/24/16 2009 07/25/16 0501 07/26/16 0502  HGB 10.4*  --  8.7* 8.8*  HCT 32.1*  --  26.7* 26.9*  PLT 182  --  154 156  LABPROT  --  19.4* 20.5* 20.6*  INR  --  1.61 1.74 1.74  CREATININE 2.16*  --  1.70* 1.47*    Estimated Creatinine Clearance: 23.9 mL/min (by C-G formula based on SCr of 1.47 mg/dL (H)).   Medical History: Past Medical History:  Diagnosis Date  . Alzheimer's disease, focal onset   . Atrial fibrillation ([place])   . CAD (coronary artery disease)   . Chronic atrial fibrillation ([place])   . High blood pressure   . Low back pain   . Memory loss   . Prostate cancer ([place])   . Recurrent major depression ([place])   . Stroke [facility])      Assessment: 83 yr male admitted with pneumonia and diarrhea. PMH significant for AFib (on warfarin), HTN and CKD. Pharmacy consulted to dose warfarin. PTA on warfarin 2mg  daily - Last dose on 1/14, INR upon admission = 1.61. Of note, warfarin dose recently lowered at his nursing home for successive INR's >3.   Today, 07/26/2016  INR 1.74 again today  Hgb low 8.7, Plts low at 154 (unchanged from yesterday)  No bleeding reported  Regular diet + Ensure TID ordered  No drug interactions other than broad-spectrum antibiotics noted  Goal of Therapy:  INR 2-3   Plan:   Warfarin 3mg  po x 1 dose tonight  Check daily PT/INR   Peggyann Juba, PharmD, BCPS Pager: [phone] 07/26/2016,11:20 AM

## 2016-07-27 LAB — PROTIME-INR
INR: 1.95
Prothrombin Time: 22.5 seconds — ABNORMAL HIGH (ref 11.4–15.2)

## 2016-07-27 LAB — GLUCOSE, CAPILLARY: Glucose-Capillary: 115 mg/dL — ABNORMAL HIGH (ref 65–99)

## 2016-07-27 MED ORDER — WARFARIN SODIUM 2 MG PO TABS
2.0000 mg | ORAL_TABLET | Freq: Once | ORAL | Status: AC
  Administered 2016-07-27: 2 mg via ORAL
  Filled 2016-07-27: qty 1

## 2016-07-27 MED ORDER — IPRATROPIUM-ALBUTEROL 0.5-2.5 (3) MG/3ML IN SOLN
3.0000 mL | Freq: Two times a day (BID) | RESPIRATORY_TRACT | Status: DC
  Administered 2016-07-28: 3 mL via RESPIRATORY_TRACT
  Filled 2016-07-27: qty 3

## 2016-07-27 NOTE — Progress Notes (Signed)
PROGRESS NOTE    Kurt Thomas  [id number] DOB: December 26, 1918 DOA: 07/24/2016 PCP: Melinda Crutch, MD    Brief Narrative:  Kurt Thomas is a 83 y.o. male with medical history significant for Alzheimer dementia, chronic atrial fibrillation on warfarin, hypertension, and chronic kidney disease stage III who presents from his nursing facility for evaluation of diarrhea and leukocytosis, associated with productive cough. He waas also found to be hypotensive, admitted for left sided health care associated pneumonia.   Assessment & Plan:   Principal Problem:   HCAP (healthcare-associated pneumonia) Active Problems:   Atrial fibrillation ([place])   Essential hypertension   CKD (chronic kidney disease) stage 3, GFR 30-59 ml/min   CAD (coronary artery disease)   AKI (acute kidney injury) ([place])   Diarrhea   Normocytic anemia   Subtherapeutic international normalized ratio (INR)   PNA (pneumonia)   Left sided HCAP: Admitted for IV antibiotics. Clinical improvement.  Urine for legionella pending. Urine for strep antigen is negative.  On RA with good oxygen sats.  Duo nebs as needed.  Blood cultures done and pending and negative. Afebrile today and leukocytosis resolved.    Anemia , normocytic, anemia panel shows low iron levels.  Add iron supplements on discharge.    Acute renal failure on stage 3 ckd : suspect from dehydration and poor po intake.  Gentle hydration with IV fluids and repeat renal parameters in am show improvement. .    Diarrhea: resolved spontaneously.    Chronic atrial fibrillation: rate controlled. INR sub therapeutic, resume anticoagulation.   Hypertension:  Well controlled.        DVT prophylaxis: coumadin. Code Status: (Full) Family Communication: none at bedside.  Disposition Plan: snf o n discharge.    Consultants:  None.   Procedures: none.   Antimicrobials: vancomycin and cefepime from 1/16   Subjective: No new complaints. Reports no nausea or  vomiting. No BM yet.  Comfortable.  Objective: Vitals:   07/27/16 0600 07/27/16 0759 07/27/16 1318 07/27/16 1507  BP: (!) 158/87  119/75   Pulse: 91  90   Resp: 18  18   Temp: 98 F (36.7 C)  97.4 F (36.3 C)   TempSrc: Oral  Axillary   SpO2: 95% 95% 98% 98%  Weight:      Height:        Intake/Output Summary (Last 24 hours) at 07/27/16 1721 Last data filed at 07/27/16 1400  Gross per 24 hour  Intake             2760 ml  Output              650 ml  Net             2110 ml   Filed Weights   07/25/16 0524 07/26/16 0505 07/27/16 0500  Weight: 56.3 kg (124 lb 1.9 oz) 56.1 kg (123 lb 10.9 oz) 58.9 kg (129 lb 13.6 oz)    Examination:  General exam: Appears calm and comfortable  Respiratory system: no wheezing. , good air entry .  Cardiovascular system: S1 & S2 heard, irregular.  No JVD, murmurs, rubs, gallops or clicks. No pedal edema. Gastrointestinal system: Abdomen is nondistended, soft and nontender. No organomegaly or masses felt. Normal bowel sounds heard. Central nervous system: Alert and oriented. No focal neurological deficits. Extremities: Symmetric 5 x 5 power. Skin: No rashes, lesions or ulcers      Data Reviewed: I have personally reviewed following labs and imaging studies  CBC:  Recent  Labs Lab 07/24/16 1914 07/25/16 0501 07/26/16 0502  WBC 19.8* 13.6* 8.6  HGB 10.4* 8.7* 8.8*  HCT 32.1* 26.7* 26.9*  MCV 92.5 94.7 94.7  PLT 182 154 [id number]   Basic Metabolic Panel:  Recent Labs Lab 07/24/16 1914 07/25/16 0501 07/26/16 0502  NA 142 142 141  K 3.5 3.5 3.7  CL 109 113* 114*  CO2 26 24 21*  GLUCOSE 142* 105* 95  BUN 46* 42* 34*  CREATININE 2.16* 1.70* 1.47*  CALCIUM 9.0 8.5* 8.8*  MG  --  1.7 1.7   GFR: Estimated Creatinine Clearance: 25 mL/min (by C-G formula based on SCr of 1.47 mg/dL (H)). Liver Function Tests:  Recent Labs Lab 07/24/16 1914  AST 14*  ALT 12*  ALKPHOS 99  BILITOT 0.6  PROT 6.2*  ALBUMIN 3.0*    Recent  Labs Lab 07/24/16 1914  LIPASE 21   No results for input(s): AMMONIA in the last 168 hours. Coagulation Profile:  Recent Labs Lab 07/24/16 2009 07/25/16 0501 07/26/16 0502 07/27/16 0454  INR 1.61 1.74 1.74 1.95   Cardiac Enzymes: No results for input(s): CKTOTAL, CKMB, CKMBINDEX, TROPONINI in the last 168 hours. BNP (last 3 results) No results for input(s): PROBNP in the last 8760 hours. HbA1C: No results for input(s): HGBA1C in the last 72 hours. CBG:  Recent Labs Lab 07/25/16 0823 07/26/16 0800 07/27/16 0817  GLUCAP 136* 89 115*   Lipid Profile: No results for input(s): CHOL, HDL, LDLCALC, TRIG, CHOLHDL, LDLDIRECT in the last 72 hours. Thyroid Function Tests: No results for input(s): TSH, T4TOTAL, FREET4, T3FREE, THYROIDAB in the last 72 hours. Anemia Panel:  Recent Labs  07/25/16 0501 07/25/16 0944  VITAMINB12  --  549  FOLATE  --  19.7  FERRITIN  --  173  TIBC  --  161*  IRON  --  18*  RETICCTPCT 0.9  --    Sepsis Labs:  Recent Labs Lab 07/24/16 2021 07/24/16 2355  PROCALCITON  --  0.40  LATICACIDVEN 1.14 1.1    Recent Results (from the past 240 hour(s))  Urine culture     Status: Abnormal   Collection Time: 07/24/16  3:54 PM  Result Value Ref Range Status   Specimen Description URINE, CLEAN CATCH  Final   Special Requests NONE  Final   Culture (A)  Final    <10,000 COLONIES/mL INSIGNIFICANT GROWTH Performed at [facility], [place] [address] [place], [place] [address]    Report Status 07/26/2016 FINAL  Final  Blood Culture (routine x 2)     Status: None (Preliminary result)   Collection Time: 07/24/16  7:22 PM  Result Value Ref Range Status   Specimen Description BLOOD RIGHT ANTECUBITAL  Final   Special Requests BOTTLES DRAWN AEROBIC AND ANAEROBIC 5 CC  Final   Culture   Final    NO GROWTH 3 DAYS Performed at [facility], [place] [address] [place], [place] [address]    Report Status PENDING  Incomplete  Blood Culture  (routine x 2)     Status: None (Preliminary result)   Collection Time: 07/24/16  8:32 PM  Result Value Ref Range Status   Specimen Description BLOOD RIGHT FOREARM  Final   Special Requests BOTTLES DRAWN AEROBIC AND ANAEROBIC 5ML  Final   Culture   Final    NO GROWTH 3 DAYS Performed at [facility], [place] [address], [place], [place] [address]    Report Status PENDING  Incomplete  MRSA  PCR Screening     Status: Abnormal   Collection Time: 07/25/16  9:44 AM  Result Value Ref Range Status   MRSA by PCR POSITIVE (A) NEGATIVE Final    Comment:        The GeneXpert MRSA Assay (FDA approved for NASAL specimens only), is one component of a comprehensive MRSA colonization surveillance program. It is not intended to diagnose MRSA infection nor to guide or monitor treatment for MRSA infections. RESULT CALLED TO, READ BACK BY AND VERIFIED WITH: MCCLEAN,C. RN AT 1430 07/25/16 BY THOMPSON,N.          Radiology Studies: No results found.      Scheduled Meds: . brimonidine  1 drop Both Eyes BID   And  . timolol  1 drop Both Eyes BID  . budesonide (PULMICORT) nebulizer solution  0.25 mg Nebulization BID  . carvedilol  12.5 mg Oral BID WC  . ceFEPime (MAXIPIME) IV  1 g Intravenous Q24H  . Chlorhexidine Gluconate Cloth  6 each Topical Q0600  . DULoxetine  30 mg Oral QPM  . famotidine  20 mg Oral Daily  . feeding supplement (ENSURE ENLIVE)  120 mL Oral TID BM  . feeding supplement (ENSURE ENLIVE)  237 mL Oral Q lunch  . feeding supplement (PRO-STAT SUGAR FREE 64)  30 mL Oral BID  . ferrous Q000111Q C-folic acid  1 capsule Oral Q breakfast  . fluticasone  2 spray Each Nare QHS  . ipratropium-albuterol  3 mL Nebulization TID  . isosorbide-hydrALAZINE  1 tablet Oral BID  . latanoprost  1 drop Both Eyes QHS  . mirabegron ER  25 mg Oral Q breakfast  . mirtazapine  15 mg Oral QHS  . mupirocin ointment  1 application Nasal BID  . tamsulosin  0.4 mg Oral QPM  .  vancomycin  750 mg Intravenous Q24H  . warfarin  2 mg Oral ONCE-1800  . Warfarin - Pharmacist Dosing Inpatient   Does not apply q1800   Continuous Infusions:    LOS: 2 days    Time spent: 30 minutes.     Hosie Poisson, MD [facility] Pager [phone]   If 7PM-7AM, please contact night-coverage [email or web address] Password [facility] 07/27/2016, 5:21 PM

## 2016-07-27 NOTE — Clinical Social Work Note (Signed)
MSW received returned call from [place], [place] at [facility] reporting that patient cannot return to ALF until pressure ulcer is completely healed.   MSW informed pt's dtr, Debbie. Pt's dtr is disappointed that patient cannot return to [facility] however is agreeable to send updated information to [facility] for review. New SNF list also emailed to pt's dtr for review.   No further concerns reported at this time. MSW remains available as needed.   Glendon Axe, MSW [phone] 07/27/2016 3:56 PM

## 2016-07-27 NOTE — NC FL2 (Signed)
[place] LEVEL OF CARE SCREENING TOOL     IDENTIFICATION  Patient Name: Kurt Thomas Birthdate: [birth year] Sex: male Admission Date (Current Location): 07/24/2016  [facility] and [place] Number:  Herbalist and Address:  [facility],  [place] [address] [place]      Provider Number: [id number]  Attending Physician Name and Address:  Hosie Poisson, MD  Relative Name and Phone Number:       Current Level of Care: Hospital Recommended Level of Care: SNF Prior Approval Number:    Date Approved/Denied:   PASRR Number:   [id number]   Discharge Plan: SNF   Current Diagnoses: Patient Active Problem List   Diagnosis Date Noted  . PNA (pneumonia) 07/25/2016  . HCAP (healthcare-associated pneumonia) 07/24/2016  . AKI (acute kidney injury) ([place]) 07/24/2016  . Diarrhea 07/24/2016  . Normocytic anemia 07/24/2016  . Subtherapeutic international normalized ratio (INR) 07/24/2016  . Pressure injury of skin 06/08/2016  . Chronic anticoagulation 06/05/2016  . BPH (benign prostatic hyperplasia) 06/05/2016  . Fractured hip, right, closed, initial encounter ([place]) 06/04/2016  . Protein-calorie malnutrition, severe ([place]) 09/05/2014  . PVC's (premature ventricular contractions)   . CKD (chronic kidney disease) stage 3, GFR 30-59 ml/min 09/03/2014  . Acute encephalopathy 09/03/2014  . Vomiting 09/03/2014  . Difficulty urinating 09/03/2014  . CAD (coronary artery disease)   . Essential hypertension 06/22/2014  . Atrial fibrillation ([place]) 06/21/2014  . Hip fracture ([place]) 06/20/2014  . Renal insufficiency 06/20/2014  . Closed left hip fracture ([place])   . Lumbar stenosis 05/07/2014  . Low back pain 03/31/2014  . Gait difficulty 03/31/2014  . High blood pressure     Orientation RESPIRATION BLADDER Height & Weight     Self  Normal Incontinent Weight: 129 lb 13.6 oz (58.9 kg) Height:  5\' 6"  (167.6 cm)  BEHAVIORAL SYMPTOMS/MOOD NEUROLOGICAL BOWEL  NUTRITION STATUS   (none )  (none ) Continent Diet (Regular )  AMBULATORY STATUS COMMUNICATION OF NEEDS Skin   Supervision Verbally PU Stage and Appropriate Care   PU Stage 2 Dressing: No Dressing                   Personal Care Assistance Level of Assistance  Dressing, Bathing, Feeding Bathing Assistance: Limited assistance Feeding assistance: Independent Dressing Assistance: Limited assistance     Functional Limitations Info  Speech, Hearing, Sight Sight Info: Adequate Hearing Info: Adequate Speech Info: Adequate    SPECIAL CARE FACTORS FREQUENCY                       Contractures      Additional Factors Info  Code Status, Allergies Code Status Info: FULL CODE  Allergies Info: N/A           Current Medications (07/27/2016):  This is the current hospital active medication list Current Facility-Administered Medications  Medication Dose Route Frequency Provider Last Rate Last Dose  . acetaminophen (TYLENOL) tablet 650 mg  650 mg Oral Q6H PRN Ilene Qua Opyd, MD      . albuterol (PROVENTIL) (2.5 MG/3ML) 0.083% nebulizer solution 2.5 mg  2.5 mg Nebulization Q4H PRN Ilene Qua Opyd, MD      . brimonidine (ALPHAGAN) 0.2 % ophthalmic solution 1 drop  1 drop Both Eyes BID Vianne Bulls, MD   1 drop at 07/27/16 1009   And  . timolol (TIMOPTIC) 0.5 % ophthalmic solution 1 drop  1 drop Both Eyes BID Ilene Qua  Opyd, MD   1 drop at 07/27/16 1009  . budesonide (PULMICORT) nebulizer solution 0.25 mg  0.25 mg Nebulization BID Eugenie Filler, MD   0.25 mg at 07/27/16 0758  . carvedilol (COREG) tablet 12.5 mg  12.5 mg Oral BID WC Ilene Qua Opyd, MD   12.5 mg at 07/27/16 0859  . ceFEPIme (MAXIPIME) 1 g in dextrose 5 % 50 mL IVPB  1 g Intravenous Q24H Leann T Poindexter, RPH   1 g at 07/27/16 0607  . Chlorhexidine Gluconate Cloth 2 % PADS 6 each  6 each Topical Q0600 Eugenie Filler, MD   6 each at 07/27/16 0600  . diphenhydrAMINE (BENADRYL) 12.5 MG/5ML elixir 12.5 mg  12.5  mg Oral TID PRN Vianne Bulls, MD      . DULoxetine (CYMBALTA) DR capsule 30 mg  30 mg Oral QPM Vianne Bulls, MD   30 mg at 07/26/16 1740  . famotidine (PEPCID) tablet 20 mg  20 mg Oral Daily Eugenie Filler, MD   20 mg at 07/27/16 1010  . feeding supplement (ENSURE ENLIVE) (ENSURE ENLIVE) liquid 120 mL  120 mL Oral TID BM Ilene Qua Opyd, MD   120 mL at 07/27/16 1000  . feeding supplement (ENSURE ENLIVE) (ENSURE ENLIVE) liquid 237 mL  237 mL Oral Q lunch Ilene Qua Opyd, MD   237 mL at 07/27/16 1008  . feeding supplement (PRO-STAT SUGAR FREE 64) liquid 30 mL  30 mL Oral BID Vianne Bulls, MD   30 mL at 07/27/16 1009  . ferrous Q000111Q C-folic acid (TRINSICON / FOLTRIN) capsule 1 capsule  1 capsule Oral Q breakfast Vianne Bulls, MD   1 capsule at 07/27/16 0859  . fluticasone (FLONASE) 50 MCG/ACT nasal spray 2 spray  2 spray Each Nare QHS Vianne Bulls, MD   2 spray at 07/26/16 2252  . HYDROcodone-acetaminophen (NORCO/VICODIN) 5-325 MG per tablet 1-2 tablet  1-2 tablet Oral Q6H PRN Ilene Qua Opyd, MD      . ipratropium-albuterol (DUONEB) 0.5-2.5 (3) MG/3ML nebulizer solution 3 mL  3 mL Nebulization TID Eugenie Filler, MD   3 mL at 07/27/16 0758  . isosorbide-hydrALAZINE (BIDIL) 20-37.5 MG per tablet 1 tablet  1 tablet Oral BID Vianne Bulls, MD   1 tablet at 07/27/16 1010  . latanoprost (XALATAN) 0.005 % ophthalmic solution 1 drop  1 drop Both Eyes QHS Vianne Bulls, MD   1 drop at 07/26/16 2254  . mirabegron ER (MYRBETRIQ) tablet 25 mg  25 mg Oral Q breakfast Vianne Bulls, MD   25 mg at 07/27/16 0859  . mirtazapine (REMERON) tablet 15 mg  15 mg Oral QHS Vianne Bulls, MD   15 mg at 07/26/16 2255  . mupirocin ointment (BACTROBAN) 2 % 1 application  1 application Nasal BID Eugenie Filler, MD   1 application at 0000000 1010  . ondansetron (ZOFRAN) tablet 4 mg  4 mg Oral Q6H PRN Vianne Bulls, MD       Or  . ondansetron (ZOFRAN) injection 4 mg  4 mg Intravenous Q6H PRN  Vianne Bulls, MD      . tamsulosin (FLOMAX) capsule 0.4 mg  0.4 mg Oral QPM Vianne Bulls, MD   0.4 mg at 07/26/16 1739  . vancomycin (VANCOCIN) IVPB 750 mg/150 ml premix  750 mg Intravenous Q24H Emiliano Dyer, RPH   750 mg at 07/27/16 1140  . warfarin (COUMADIN) tablet 2 mg  2 mg Oral ONCE-1800 Emiliano Dyer, [facility]      . Warfarin - Pharmacist Dosing Inpatient   Does not apply [place], [facility]         Discharge Medications: Please see discharge summary for a list of discharge medications.  Relevant Imaging Results:  Relevant Lab Results:   Additional Information SSN [id number]  Glendon Axe A

## 2016-07-27 NOTE — Clinical Social Work Note (Signed)
Clinical Social Work Assessment  Patient Details  Name: Kurt Thomas MRN: [id number] Date of Birth: [birth year]  Date of referral:  07/27/16               Reason for consult:  Discharge Planning                Permission sought to share information with:  Family Supports, Customer service manager, Case Optician, dispensing granted to share information::  Yes, Verbal Permission Granted  Name::      Psychologist, forensic )  Agency::   ([place] )  Relationship::   (Daughter )  Contact Information:   [phone])  Housing/Transportation Living arrangements for the past 2 months:  [place], [place] of Information:  Adult Children Patient Interpreter Needed:  None Criminal Activity/Legal Involvement Pertinent to Current Situation/Hospitalization:  No - Comment as needed Significant Relationships:  Adult Children Lives with:  Facility Resident Do you feel safe going back to the place where you live?  Yes Need for family participation in patient care:  No (Coment)  Care giving concerns:  Patient admitted from SNF, [facility] and [place] however initially from [place] and plans to return once stable.   Social Worker assessment / plan:  MSW spoke with patient's dtr, Debbie at length in reference to discharge planning. MSW introduced MSW role. Patient's dtr reported that patient has been at [facility] resident of [facility] for the month and family wishes for patient to return to ALF once medically stable. Patient's dtr has been holding room at [facility] for pt's return. MSW contacted Lannette Donath, RN at facility however unable to leave message. FL-2 completed and faxed via [place]. MSW awaiting returned phone call from [place]. No further concerns expressed by dtr at this time. MSW will continue to follow pt and family for continued support and to facilitate pt's dc needs once medically stable.    Employment status:  Retired Forensic scientist:  Medicare PT Recommendations:  Not assessed at this time [place] / Referral to community resources:  Other (Comment Required) (return to ALF at dc )  Patient/Family's Response to care:  Patient disoriented. Patient's dtr, Jackelyn Poling wishes for patient to return to [facility]F once medically stable. Patient's dtr supportive and strongly involved in pt's care. Patient's dtr appreciated sw intervention.   Patient/Family's Understanding of and Emotional Response to Diagnosis, Current Treatment, and Prognosis: Patient's dtr knowledgeable of medical condition and continued interventions.   Emotional Assessment Appearance:  Appears stated age Attitude/Demeanor/Rapport:  Unable to Assess Affect (typically observed):  Unable to Assess Orientation:  Oriented to Self Alcohol / Substance use:  Not Applicable Psych involvement (Current and /or in the community):  No (Comment)  Discharge Needs  Concerns to be addressed:  Care Coordination Readmission within the last 30 days:  No Current discharge risk:  Cognitively Impaired Barriers to Discharge:  Continued Medical Work up   [employer], MSW [phone] 07/27/2016 2:53 PM

## 2016-07-27 NOTE — Progress Notes (Signed)
ANTICOAGULATION CONSULT NOTE - Follow Up  Pharmacy Consult for Warfarin Indication: atrial fibrillation  No Known Allergies  Patient Measurements: Height: 5\' 6"  (167.6 cm) Weight: 129 lb 13.6 oz (58.9 kg) IBW/kg (Calculated) : 63.8  Vital Signs: Temp: 98 F (36.7 C) (01/18 0600) Temp Source: Oral (01/18 0600) BP: 158/87 (01/18 0600) Pulse Rate: 91 (01/18 0600)  Labs:  Recent Labs  07/24/16 1914  07/25/16 0501 07/26/16 0502 07/27/16 0454  HGB 10.4*  --  8.7* 8.8*  --   HCT 32.1*  --  26.7* 26.9*  --   PLT 182  --  154 156  --   LABPROT  --   < > 20.5* 20.6* 22.5*  INR  --   < > 1.74 1.74 1.95  CREATININE 2.16*  --  1.70* 1.47*  --   < > = values in this interval not displayed.  Estimated Creatinine Clearance: 25 mL/min (by C-G formula based on SCr of 1.47 mg/dL (H)).   Medical History: Past Medical History:  Diagnosis Date  . Alzheimer's disease, focal onset   . Atrial fibrillation ([place])   . CAD (coronary artery disease)   . Chronic atrial fibrillation ([place])   . High blood pressure   . Low back pain   . Memory loss   . Prostate cancer ([place])   . Recurrent major depression ([place])   . Stroke [facility])      Assessment: 83 yr male admitted with pneumonia and diarrhea. PMH significant for AFib (on warfarin), HTN and CKD. Pharmacy consulted to dose warfarin. PTA on warfarin 2mg  daily - Last dose on 1/14, INR upon admission = 1.61. Of note, warfarin dose recently lowered at his nursing home for successive INR's >3.   Today, 07/27/2016  INR almost therapeutic (1.95) today  Hgb low 8.7, Plts low at 154 (1/17)  No bleeding reported  Regular diet + Ensure TID ordered  No drug interactions other than broad-spectrum antibiotics noted  Goal of Therapy:  INR 2-3   Plan:   Warfarin 2mg  (home dose) po x 1 dose tonight  Check daily PT/INR   Peggyann Juba, PharmD, BCPS Pager: [phone] 07/27/2016,7:27 AM

## 2016-07-28 DIAGNOSIS — R531 Weakness: Secondary | ICD-10-CM | POA: Diagnosis not present

## 2016-07-28 DIAGNOSIS — I481 Persistent atrial fibrillation: Secondary | ICD-10-CM | POA: Diagnosis not present

## 2016-07-28 DIAGNOSIS — F329 Major depressive disorder, single episode, unspecified: Secondary | ICD-10-CM | POA: Diagnosis not present

## 2016-07-28 DIAGNOSIS — R488 Other symbolic dysfunctions: Secondary | ICD-10-CM | POA: Diagnosis not present

## 2016-07-28 DIAGNOSIS — D638 Anemia in other chronic diseases classified elsewhere: Secondary | ICD-10-CM | POA: Diagnosis not present

## 2016-07-28 DIAGNOSIS — I4891 Unspecified atrial fibrillation: Secondary | ICD-10-CM | POA: Diagnosis not present

## 2016-07-28 DIAGNOSIS — M6289 Other specified disorders of muscle: Secondary | ICD-10-CM | POA: Diagnosis not present

## 2016-07-28 DIAGNOSIS — L89152 Pressure ulcer of sacral region, stage 2: Secondary | ICD-10-CM | POA: Diagnosis not present

## 2016-07-28 DIAGNOSIS — J189 Pneumonia, unspecified organism: Secondary | ICD-10-CM | POA: Diagnosis not present

## 2016-07-28 DIAGNOSIS — R2681 Unsteadiness on feet: Secondary | ICD-10-CM | POA: Diagnosis not present

## 2016-07-28 DIAGNOSIS — R634 Abnormal weight loss: Secondary | ICD-10-CM | POA: Diagnosis not present

## 2016-07-28 DIAGNOSIS — M24569 Contracture, unspecified knee: Secondary | ICD-10-CM | POA: Diagnosis not present

## 2016-07-28 DIAGNOSIS — M6281 Muscle weakness (generalized): Secondary | ICD-10-CM | POA: Diagnosis not present

## 2016-07-28 DIAGNOSIS — E611 Iron deficiency: Secondary | ICD-10-CM | POA: Diagnosis not present

## 2016-07-28 DIAGNOSIS — L89102 Pressure ulcer of unspecified part of back, stage 2: Secondary | ICD-10-CM | POA: Diagnosis not present

## 2016-07-28 DIAGNOSIS — D509 Iron deficiency anemia, unspecified: Secondary | ICD-10-CM | POA: Diagnosis not present

## 2016-07-28 DIAGNOSIS — N179 Acute kidney failure, unspecified: Secondary | ICD-10-CM | POA: Diagnosis not present

## 2016-07-28 DIAGNOSIS — R2689 Other abnormalities of gait and mobility: Secondary | ICD-10-CM | POA: Diagnosis not present

## 2016-07-28 DIAGNOSIS — I482 Chronic atrial fibrillation: Secondary | ICD-10-CM | POA: Diagnosis not present

## 2016-07-28 DIAGNOSIS — K5900 Constipation, unspecified: Secondary | ICD-10-CM | POA: Diagnosis not present

## 2016-07-28 DIAGNOSIS — D72829 Elevated white blood cell count, unspecified: Secondary | ICD-10-CM | POA: Diagnosis not present

## 2016-07-28 DIAGNOSIS — L89153 Pressure ulcer of sacral region, stage 3: Secondary | ICD-10-CM | POA: Diagnosis not present

## 2016-07-28 DIAGNOSIS — R5381 Other malaise: Secondary | ICD-10-CM | POA: Diagnosis not present

## 2016-07-28 DIAGNOSIS — I10 Essential (primary) hypertension: Secondary | ICD-10-CM | POA: Diagnosis not present

## 2016-07-28 DIAGNOSIS — R1312 Dysphagia, oropharyngeal phase: Secondary | ICD-10-CM | POA: Diagnosis not present

## 2016-07-28 DIAGNOSIS — K219 Gastro-esophageal reflux disease without esophagitis: Secondary | ICD-10-CM | POA: Diagnosis not present

## 2016-07-28 DIAGNOSIS — N3281 Overactive bladder: Secondary | ICD-10-CM | POA: Diagnosis not present

## 2016-07-28 DIAGNOSIS — H409 Unspecified glaucoma: Secondary | ICD-10-CM | POA: Diagnosis not present

## 2016-07-28 DIAGNOSIS — Z7901 Long term (current) use of anticoagulants: Secondary | ICD-10-CM | POA: Diagnosis not present

## 2016-07-28 DIAGNOSIS — J309 Allergic rhinitis, unspecified: Secondary | ICD-10-CM | POA: Diagnosis not present

## 2016-07-28 DIAGNOSIS — N183 Chronic kidney disease, stage 3 (moderate): Secondary | ICD-10-CM | POA: Diagnosis not present

## 2016-07-28 LAB — GLUCOSE, CAPILLARY: Glucose-Capillary: 114 mg/dL — ABNORMAL HIGH (ref 65–99)

## 2016-07-28 LAB — BASIC METABOLIC PANEL
ANION GAP: 7 (ref 5–15)
BUN: 24 mg/dL — AB (ref 6–20)
CALCIUM: 9.6 mg/dL (ref 8.9–10.3)
CO2: 23 mmol/L (ref 22–32)
Chloride: 111 mmol/L (ref 101–111)
Creatinine, Ser: 1.3 mg/dL — ABNORMAL HIGH (ref 0.61–1.24)
GFR calc Af Amer: 52 mL/min — ABNORMAL LOW (ref >60)
GFR calc non Af Amer: 45 mL/min — ABNORMAL LOW (ref >60)
GLUCOSE: 102 mg/dL — AB (ref 65–99)
POTASSIUM: 4.1 mmol/L (ref 3.5–5.1)
Sodium: 141 mmol/L (ref 135–145)

## 2016-07-28 LAB — PROTIME-INR
INR: 1.84
Prothrombin Time: 21.5 seconds — ABNORMAL HIGH (ref 11.4–15.2)

## 2016-07-28 MED ORDER — LEVOFLOXACIN 500 MG PO TABS: 500.0000 mg | ORAL_TABLET | Freq: Every day | ORAL | 0 refills | Status: DC

## 2016-07-28 MED ORDER — HYDROCODONE-ACETAMINOPHEN 5-325 MG PO TABS: 1.0000 | ORAL_TABLET | Freq: Four times a day (QID) | ORAL | 0 refills | Status: DC | PRN

## 2016-07-28 MED ORDER — AMOXICILLIN-POT CLAVULANATE 875-125 MG PO TABS: 1.0000 | ORAL_TABLET | Freq: Two times a day (BID) | ORAL | 0 refills | Status: DC

## 2016-07-28 MED ORDER — WARFARIN SODIUM 4 MG PO TABS
4.0000 mg | ORAL_TABLET | Freq: Once | ORAL | Status: AC
  Administered 2016-07-28: 4 mg via ORAL
  Filled 2016-07-28: qty 1

## 2016-07-28 MED ORDER — MUPIROCIN 2 % EX OINT: 1.0000 "application " | TOPICAL_OINTMENT | Freq: Two times a day (BID) | CUTANEOUS | 0 refills | Status: DC

## 2016-07-28 MED ORDER — IPRATROPIUM-ALBUTEROL 0.5-2.5 (3) MG/3ML IN SOLN: 3.0000 mL | Freq: Four times a day (QID) | RESPIRATORY_TRACT | 0 refills | Status: AC | PRN

## 2016-07-28 NOTE — Discharge Summary (Signed)
Physician Discharge Summary  Kurt Thomas [id number] DOB: 1919-07-10 DOA: 07/24/2016  PCP: Melinda Crutch, MD  Admit date: 07/24/2016 Discharge date: 07/28/2016  Admitted From: SNF Disposition: SNF  Recommendations for Outpatient Follow-up:  1. Follow up with PCP in 1-2 weeks 2. Please obtain BMP/CBC in one week     Discharge Condition:STABLE.  CODE STATUS:FULL CODE.  Diet recommendation: REGULAR  Brief/Interim Summary: Ausitn Many a 83 y.o.malewith medical history significant for Alzheimer dementia, chronic atrial fibrillation on warfarin, hypertension, and chronic kidney disease stage III who presents from his nursing facility for evaluation of diarrhea and leukocytosis, associated with productive cough. He waas also found to be hypotensive, admitted for left sided health care associated pneumonia.   Discharge Diagnoses:  Principal Problem:   HCAP (healthcare-associated pneumonia) Active Problems:   Atrial fibrillation ([place])   Essential hypertension   CKD (chronic kidney disease) stage 3, GFR 30-59 ml/min   CAD (coronary artery disease)   AKI (acute kidney injury) ([place])   Diarrhea   Normocytic anemia   Subtherapeutic international normalized ratio (INR)   PNA (pneumonia) stage 2 pressure ulcer on coccyx.   Left sided HCAP: Admitted for IV antibiotics. Clinical improvement.  . Urine for strep antigen is negative.  On RA with good oxygen sats.  Duo nebs as needed.  Blood cultures done and pending and negative. Afebrile today and leukocytosis resolved.  Recommend another 2 days of po antibiotics on discharge to complete the course.    Anemia , normocytic, anemia panel shows low iron levels.  Add iron supplements on discharge.    Acute renal failure on stage 3 ckd : suspect from dehydration and poor po intake.  Gentle hydration with IV fluids and repeat renal parameters in am show improvement. .    Diarrhea: resolved spontaneously.    Chronic atrial  fibrillation: rate controlled. INR sub therapeutic, resume anticoagulation.   Hypertension:  Resume home meds on discharge.   Stage 2 pressure ulcer on coccyx:  Dressing applied.  Frequent re positioning.     Discharge Instructions  Discharge Instructions    Diet general    Complete by:  As directed    Discharge instructions    Complete by:  As directed    Please follow up with PCP in 1 to 2 weeks and get a repeat CXR for resolution of the pneumonia.     Allergies as of 07/28/2016   No Known Allergies     Medication List    TAKE these medications   acetaminophen 500 MG tablet Commonly known as:  TYLENOL Take 1,000 mg by mouth every 8 (eight) hours as needed (pain). Maximum 3 tablets in 24 hours   albuterol (2.5 MG/3ML) 0.083% nebulizer solution Commonly known as:  PROVENTIL Take 2.5 mg by nebulization every 6 (six) hours as needed for wheezing.   amLODipine 10 MG tablet Commonly known as:  NORVASC Take 1 tablet (10 mg total) by mouth daily. What changed:  when to take this   amoxicillin-clavulanate 875-125 MG tablet Commonly known as:  AUGMENTIN Take 1 tablet by mouth 2 (two) times daily. Start taking on:  07/29/2016   brimonidine-timolol 0.2-0.5 % ophthalmic solution Commonly known as:  COMBIGAN Place 1 drop into both eyes every 12 (twelve) hours.   carvedilol 12.5 MG tablet Commonly known as:  COREG Take 12.5 mg by mouth 2 (two) times daily with a meal.   CENTRATEX 106-1 MG Caps Take 1 capsule by mouth daily with breakfast.   Collagen Hydrolysate (Bovine) Powd  Apply 1 application topically as needed (for wound care).   diphenhydrAMINE 12.5 MG/5ML syrup Commonly known as:  BENYLIN Take 12.5 mg by mouth 3 (three) times daily as needed for itching.   docusate sodium 100 MG capsule Commonly known as:  COLACE Take 1 capsule (100 mg total) by mouth daily as needed for mild constipation.   DULoxetine 30 MG capsule Commonly known as:  CYMBALTA Take 30  mg by mouth every evening.   feeding supplement (PRO-STAT SUGAR FREE 64) Liqd Take 30 mLs by mouth 2 (two) times daily.   fluticasone 50 MCG/ACT nasal spray Commonly known as:  FLONASE Place 2 sprays into both nostrils at bedtime.   HYDROcodone-acetaminophen 5-325 MG tablet Commonly known as:  NORCO/VICODIN Take 1 tablet by mouth every 6 (six) hours as needed for severe pain.   ipratropium-albuterol 0.5-2.5 (3) MG/3ML Soln Commonly known as:  DUONEB Take 3 mLs by nebulization every 6 (six) hours as needed.   isosorbide-hydrALAZINE 20-37.5 MG tablet Commonly known as:  BIDIL Take 1 tablet by mouth 2 (two) times daily.   latanoprost 0.005 % ophthalmic solution Commonly known as:  XALATAN Place 1 drop into both eyes at bedtime.   loperamide 2 MG capsule Commonly known as:  IMODIUM Take 2 mg by mouth every 6 (six) hours as needed for diarrhea or loose stools.   mirtazapine 15 MG tablet Commonly known as:  REMERON Take 15 mg by mouth every evening.   mupirocin ointment 2 % Commonly known as:  BACTROBAN Place 1 application into the nose 2 (two) times daily.   MYRBETRIQ 25 MG Tb24 tablet Generic drug:  mirabegron ER Take 25 mg by mouth daily with breakfast.   neomycin-bacitracin-polymyxin ointment Commonly known as:  NEOSPORIN Apply 1 application topically as needed for wound care. apply to eye   NUTRITIONAL SUPPLEMENT Liqd Take 1 each by mouth daily with lunch. *Magic Cup*   NUTRITIONAL DRINK Liqd Take 120 mLs by mouth 3 (three) times daily between meals. *Med Pass*   ondansetron 4 MG tablet Commonly known as:  ZOFRAN Take 4 mg by mouth every 8 (eight) hours as needed for nausea or vomiting.   polyethylene glycol packet Commonly known as:  MIRALAX / GLYCOLAX Take 17 g by mouth daily as needed. Mix in 8 oz liquid and drink   ranitidine 150 MG tablet Commonly known as:  ZANTAC Take 150 mg by mouth at bedtime.   senna-docusate 8.6-50 MG tablet Commonly known as:   Senokot-S Take 2 tablets by mouth 2 (two) times daily.   SILVER HYDROGEL Gel Apply 1 application topically as needed (for wound care).   tamsulosin 0.4 MG Caps capsule Commonly known as:  FLOMAX Take 0.4 mg by mouth every evening. 5pm   warfarin 2 MG tablet Commonly known as:  COUMADIN Take 2 mg by mouth at bedtime.      Follow-up Information    Melinda Crutch, MD. Schedule an appointment as soon as possible for a visit in 1 week(s).   Specialty:  Family Medicine Contact information: [place] [address] [phone]          No Known Allergies  Consultations:  None.     Procedures/Studies: Dg Chest 2 View  Result Date: 07/24/2016 CLINICAL DATA:  Elevated white blood cell count. EXAM: CHEST  2 VIEW COMPARISON:  Chest x-ray dated 06/09/2016. FINDINGS: Mild cardiomegaly is stable. Median sternotomy wires appear intact and stable in alignment. Lungs are at least mildly hyperexpanded suggesting COPD.  Coarse interstitial markings noted bilaterally, not significantly changed compared to previous exams suggesting chronic scarring/fibrosis. Slightly more confluent opacity is now seen at the left lung base. No pleural effusion or pneumothorax seen. IMPRESSION: 1. Increased opacity at the left lung base which could represent pneumonia, atelectasis or mild edema. If any associated fever, would favor pneumonia. 2. Hyperexpanded lungs suggesting some degree of COPD/emphysema. 3. Stable cardiomegaly.  No evidence of active CHF. Electronically Signed   By: Franki Cabot M.D.   On: 07/24/2016 21:07   Dg Op Swallowing Func-medicare/speech Path  Result Date: 06/30/2016 Objective Swallowing Evaluation: Type of Study: MBS-Modified Barium Swallow Study Patient Details Name: Quintarious Lutes MRN: [id number] Date of Birth: [birth year] Today's Date: 06/30/2016 Time: SLP Start Time (ACUTE ONLY): 1216-SLP Stop Time (ACUTE ONLY): 1254 SLP Time Calculation (min) (ACUTE ONLY): 38 min Past Medical  History: Past Medical History: Diagnosis Date . Alzheimer's disease, focal onset  . Atrial fibrillation ([place])  . CAD (coronary artery disease)  . Chronic atrial fibrillation ([place])  . High blood pressure  . Low back pain  . Memory loss  . Prostate cancer ([place])  . Recurrent major depression ([place])  . Stroke [facility])  Past Surgical History: Past Surgical History: Procedure Laterality Date . CATARACT EXTRACTION   . CORONARY ARTERY BYPASS GRAFT   . FEMUR IM NAIL Right 06/05/2016  Procedure: INTRAMEDULLARY (IM) NAIL FEMORAL;  Surgeon: Mcarthur Rossetti, MD;  Location: [facility];  Service: Orthopedics;  Laterality: Right; . HIP ARTHROPLASTY Left 06/22/2014  Procedure: LEFT HEMIARTHROPLASTY HIP;  Surgeon: Meredith Pel, MD;  Location: [facility];  Service: Orthopedics;  Laterality: Left; . PROSTATECTOMY   HPI: Pt is a 83 year old male arriving for an outpatient MBS after recent hospitalization for hip fx. pt has a history of esophageal dysphagia and mild oropahryngeal dysphagia. MBS in 2016 (recommended after pt sensed aspriation on esophagram) showed premature spillage, delayed swallow no penetration or aspiration.  No Data Recorded Assessment / Plan / Recommendation CHL IP CLINICAL IMPRESSIONS 06/30/2016 Therapy Diagnosis Suspected primary esophageal dysphagia;Moderate oral phase dysphagia;Moderate pharyngeal phase dysphagia Clinical Impression Pt demonstrates a moderate oral dysphagia with lingual pumping of boluses and significant premature spillage as well as oral residual. This impacts pharyngeal swallow as premature spillage pools in pyriforms and is aspirated before/during the swallow as larynx elevates. Oral residuals also spill to the valleculae post swallow and contribute to mild nectar penetrate. A throat clear/cough with a second swallow aids in clearance of nectar penetrate and residue. Overall, strength of pharyngeal swallow is adequate, but timing and head/trunk support are very poor making sensed aspiration  events probable during meals. Recommend a short term diet modification as pt rehabs from hip fx, though long term thickened liquids would not be recommended for a pt of advanced age with a significant esophageal phase dysphagia that is likely to aspirate reflux/backflow from esophagus as well. Recommend SLP at SNF/HH for diet management and use of cough/threat clear/second swallow as compensatory strategies.  Impact on safety and function Moderate aspiration risk   CHL IP TREATMENT RECOMMENDATION 06/30/2016 Treatment Recommendations Defer treatment plan to f/u with SLP   No flowsheet data found. CHL IP DIET RECOMMENDATION 06/30/2016 SLP Diet Recommendations Dysphagia 3 (Mech soft) solids;Nectar thick liquid Liquid Administration via Cup;Straw Medication Administration Crushed with puree Compensations Clear throat intermittently;Slow rate;Small sips/bites Postural Changes Seated upright at 90 degrees;Remain semi-upright after after feeds/meals (Comment)   No flowsheet data found.  CHL IP FOLLOW UP RECOMMENDATIONS 09/04/2014 Follow up Recommendations  None   CHL IP FREQUENCY AND DURATION 09/07/2014 Speech Therapy Frequency (ACUTE ONLY) min 2x/week Treatment Duration 1 week      CHL IP ORAL PHASE 06/30/2016 Oral Phase Impaired Oral - Pudding Teaspoon -- Oral - Pudding Cup -- Oral - Honey Teaspoon -- Oral - Honey Cup -- Oral - Nectar Teaspoon Lingual pumping;Reduced posterior propulsion;Lingual/palatal residue;Delayed oral transit;Decreased bolus cohesion;Premature spillage Oral - Nectar Cup Lingual pumping;Reduced posterior propulsion;Lingual/palatal residue;Delayed oral transit;Decreased bolus cohesion;Premature spillage Oral - Nectar Straw Lingual pumping;Reduced posterior propulsion;Lingual/palatal residue;Delayed oral transit;Decreased bolus cohesion;Premature spillage Oral - Thin Teaspoon Lingual pumping;Reduced posterior propulsion;Lingual/palatal residue;Delayed oral transit;Decreased bolus cohesion;Premature  spillage Oral - Thin Cup Lingual pumping;Reduced posterior propulsion;Lingual/palatal residue;Delayed oral transit;Decreased bolus cohesion;Premature spillage Oral - Thin Straw Lingual pumping;Reduced posterior propulsion;Lingual/palatal residue;Delayed oral transit;Decreased bolus cohesion;Premature spillage Oral - Puree Lingual pumping;Reduced posterior propulsion;Lingual/palatal residue;Delayed oral transit;Decreased bolus cohesion Oral - Mech Soft -- Oral - Regular Lingual pumping;Reduced posterior propulsion;Lingual/palatal residue;Delayed oral transit;Decreased bolus cohesion;Premature spillage Oral - Multi-Consistency -- Oral - Pill -- Oral Phase - Comment --  CHL IP PHARYNGEAL PHASE 06/30/2016 Pharyngeal Phase Impaired Pharyngeal- Pudding Teaspoon -- Pharyngeal -- Pharyngeal- Pudding Cup -- Pharyngeal -- Pharyngeal- Honey Teaspoon -- Pharyngeal -- Pharyngeal- Honey Cup -- Pharyngeal -- Pharyngeal- Nectar Teaspoon Delayed swallow initiation-pyriform sinuses;Pharyngeal residue - valleculae;Penetration/Aspiration before swallow;Penetration/Aspiration during swallow Pharyngeal Material enters airway, remains ABOVE vocal cords and not ejected out Pharyngeal- Nectar Cup Delayed swallow initiation-pyriform sinuses;Pharyngeal residue - valleculae;Penetration/Aspiration before swallow;Penetration/Aspiration during swallow Pharyngeal Material enters airway, remains ABOVE vocal cords and not ejected out Pharyngeal- Nectar Straw Delayed swallow initiation-pyriform sinuses;Pharyngeal residue - valleculae;Penetration/Aspiration before swallow;Penetration/Aspiration during swallow Pharyngeal Material enters airway, remains ABOVE vocal cords and not ejected out Pharyngeal- Thin Teaspoon Delayed swallow initiation-pyriform sinuses;Pharyngeal residue - valleculae;Penetration/Aspiration before swallow;Penetration/Aspiration during swallow;Trace aspiration Pharyngeal Material enters airway, passes BELOW cords without attempt by  patient to eject out (silent aspiration) Pharyngeal- Thin Cup Delayed swallow initiation-pyriform sinuses;Pharyngeal residue - valleculae;Penetration/Aspiration before swallow;Penetration/Aspiration during swallow;Significant aspiration (Amount) Pharyngeal Material enters airway, passes BELOW cords and not ejected out despite cough attempt by patient Pharyngeal- Thin Straw Delayed swallow initiation-pyriform sinuses;Pharyngeal residue - valleculae;Penetration/Aspiration before swallow;Penetration/Aspiration during swallow;Trace aspiration Pharyngeal Material enters airway, passes BELOW cords and not ejected out despite cough attempt by patient Pharyngeal- Puree Delayed swallow initiation-vallecula;Pharyngeal residue - valleculae Pharyngeal -- Pharyngeal- Mechanical Soft -- Pharyngeal -- Pharyngeal- Regular Delayed swallow initiation-vallecula;Pharyngeal residue - valleculae Pharyngeal -- Pharyngeal- Multi-consistency -- Pharyngeal -- Pharyngeal- Pill -- Pharyngeal -- Pharyngeal Comment --  CHL IP CERVICAL ESOPHAGEAL PHASE 06/30/2016 Cervical Esophageal Phase Impaired Pudding Teaspoon -- Pudding Cup -- Honey Teaspoon -- Honey Cup -- Nectar Teaspoon -- Nectar Cup -- Nectar Straw -- Thin Teaspoon -- Thin Cup -- Thin Straw -- Puree -- Mechanical Soft -- Regular -- Multi-consistency -- Pill -- Cervical Esophageal Comment CP bar, dilated proximal esophagus CHL IP GO 06/30/2016 Functional Assessment Tool Used clinical judgement Functional Limitations Swallowing Swallow Current Status [id number]) CK Swallow Goal Status [id number]) CK Swallow Discharge Status [id number]) CK Motor Speech Current Status [id number]) (None) Motor Speech Goal Status [id number]) (None) Motor Speech Goal Status [id number]) (None) Spoken Language Comprehension Current Status [id number]) (None) Spoken Language Comprehension Goal Status [id number]) (None) Spoken Language Comprehension Discharge Status [id number]) (None) Spoken Language Expression Current Status [id number]) (None) Spoken  Language Expression Goal Status [id number]) (None) Spoken Language Expression Discharge Status [id number]) (None) Attention Current Status [id number]) (None) Attention Goal Status [id number]) (None) Attention Discharge Status [id number]) (None) Memory Current Status [id number]) (None) Memory Goal Status [id number]) (None)  Memory Discharge Status [phone]) (None) Voice Current Status [phone]) (None) Voice Goal Status [id number]) (None) Voice Discharge Status [phone]) (None) Other Speech-Language Pathology Functional Limitation [phone]) (None) Other Speech-Language Pathology Functional Limitation Goal Status [id number]) (None) Other Speech-Language Pathology Functional Limitation Discharge Status [phone]) (None) DeBlois, Katherene Ponto 06/30/2016, 2:44 PM            CLINICAL DATA:  Dysphagia. EXAM: MODIFIED BARIUM SWALLOW TECHNIQUE: Different consistencies of barium were administered orally to the patient by the Speech Pathologist. Imaging of the pharynx was performed in the lateral projection. FLUOROSCOPY TIME:  Fluoroscopy Time:  3 minutes 42 second Radiation Exposure Index (if provided by the fluoroscopic device): Number of Acquired Spot Images: 0 COMPARISON:  09/06/2014 FINDINGS: Thin liquid- patient aspirated on the initial swallow. There was coughing. There was delayed swallowing trigger with retention. Additional swallows with tea spoon showed no aspiration. The patient aspirated with straw sips. Nectar thick liquid- delayed swallowing trigger with retention. No penetration Honey- not administered Pure- within normal limits Cracker-not administered Pure with cracker- not administered Barium tablet -  not administered IMPRESSION: Aspiration with thin barium. Delayed swallowing trigger with retention. Swallowing function was improved with thicker consistencies. Please refer to the Speech Pathologists report for complete details and recommendations. Electronically Signed   By: Franchot Gallo M.D.   On: 06/30/2016 14:37      Subjective:  No new  complaints. Discharge Exam: Vitals:   07/27/16 2049 07/28/16 0549  BP: (!) 155/98 (!) 162/95  Pulse: 96 95  Resp: 18   Temp: 97.8 F (36.6 C) 97.9 F (36.6 C)   Vitals:   07/27/16 2026 07/27/16 2049 07/28/16 0549 07/28/16 1117  BP:  (!) 155/98 (!) 162/95   Pulse:  96 95   Resp:  18    Temp:  97.8 F (36.6 C) 97.9 F (36.6 C)   TempSrc:  Oral Oral   SpO2: 97% 97% 97% 97%  Weight:   56.8 kg (125 lb 3.5 oz)   Height:        General: Pt is alert, awake, not in acute distress Cardiovascular: RRR, S1/S2 +, no rubs, no gallops Respiratory: CTA bilaterally, no wheezing, no rhonchi Abdominal: Soft, NT, ND, bowel sounds + Extremities: no edema, no cyanosis    The results of significant diagnostics from this hospitalization (including imaging, microbiology, ancillary and laboratory) are listed below for reference.     Microbiology: Recent Results (from the past 240 hour(s))  Urine culture     Status: Abnormal   Collection Time: 07/24/16  3:54 PM  Result Value Ref Range Status   Specimen Description URINE, CLEAN CATCH  Final   Special Requests NONE  Final   Culture (A)  Final    <10,000 COLONIES/mL INSIGNIFICANT GROWTH Performed at [facility], [address] [place], [place] [address]    Report Status 07/26/2016 FINAL  Final  Blood Culture (routine x 2)     Status: None (Preliminary result)   Collection Time: 07/24/16  7:22 PM  Result Value Ref Range Status   Specimen Description BLOOD RIGHT ANTECUBITAL  Final   Special Requests BOTTLES DRAWN AEROBIC AND ANAEROBIC 5 CC  Final   Culture   Final    NO GROWTH 3 DAYS Performed at [facility], [place] [address], [place], [place] [address]    Report Status PENDING  Incomplete  Blood Culture (routine x 2)     Status: None (Preliminary result)   Collection Time: 07/24/16  8:32 PM  Result Value Ref Range Status   Specimen Description BLOOD RIGHT FOREARM  Final   Special Requests BOTTLES DRAWN AEROBIC AND  ANAEROBIC 5ML  Final   Culture   Final    NO GROWTH 3 DAYS Performed at [facility], [address], [place], [place] [address]    Report Status PENDING  Incomplete  MRSA PCR Screening     Status: Abnormal   Collection Time: 07/25/16  9:44 AM  Result Value Ref Range Status   MRSA by PCR POSITIVE (A) NEGATIVE Final    Comment:        The GeneXpert MRSA Assay (FDA approved for NASAL specimens only), is one component of a comprehensive MRSA colonization surveillance program. It is not intended to diagnose MRSA infection nor to guide or monitor treatment for MRSA infections. RESULT CALLED TO, READ BACK BY AND VERIFIED WITH: MCCLEAN,C. RN AT 1430 07/25/16 BY THOMPSON,N.      Labs: BNP (last 3 results) No results for input(s): BNP in the last 8760 hours. Basic Metabolic Panel:  Recent Labs Lab 07/24/16 1914 07/25/16 0501 07/26/16 0502 07/28/16 0449  NA 142 142 141 141  K 3.5 3.5 3.7 4.1  CL 109 113* 114* 111  CO2 26 24 21* 23  GLUCOSE 142* 105* 95 102*  BUN 46* 42* 34* 24*  CREATININE 2.16* 1.70* 1.47* 1.30*  CALCIUM 9.0 8.5* 8.8* 9.6  MG  --  1.7 1.7  --    Liver Function Tests:  Recent Labs Lab 07/24/16 1914  AST 14*  ALT 12*  ALKPHOS 99  BILITOT 0.6  PROT 6.2*  ALBUMIN 3.0*    Recent Labs Lab 07/24/16 1914  LIPASE 21   No results for input(s): AMMONIA in the last 168 hours. CBC:  Recent Labs Lab 07/24/16 1914 07/25/16 0501 07/26/16 0502  WBC 19.8* 13.6* 8.6  HGB 10.4* 8.7* 8.8*  HCT 32.1* 26.7* 26.9*  MCV 92.5 94.7 94.7  PLT 182 154 156   Cardiac Enzymes: No results for input(s): CKTOTAL, CKMB, CKMBINDEX, TROPONINI in the last 168 hours. BNP: Invalid input(s): POCBNP CBG:  Recent Labs Lab 07/25/16 0823 07/26/16 0800 07/27/16 0817 07/28/16 0717  GLUCAP 136* 89 115* 114*   D-Dimer No results for input(s): DDIMER in the last 72 hours. Hgb A1c No results for input(s): HGBA1C in the last 72 hours. Lipid Profile No results  for input(s): CHOL, HDL, LDLCALC, TRIG, CHOLHDL, LDLDIRECT in the last 72 hours. Thyroid function studies No results for input(s): TSH, T4TOTAL, T3FREE, THYROIDAB in the last 72 hours.  Invalid input(s): FREET3 Anemia work up No results for input(s): VITAMINB12, FOLATE, FERRITIN, TIBC, IRON, RETICCTPCT in the last 72 hours. Urinalysis    Component Value Date/Time   COLORURINE YELLOW 07/24/2016 1554   APPEARANCEUR HAZY (A) 07/24/2016 1554   LABSPEC 1.015 07/24/2016 1554   PHURINE 5.0 07/24/2016 1554   GLUCOSEU NEGATIVE 07/24/2016 1554   HGBUR MODERATE (A) 07/24/2016 1554   BILIRUBINUR NEGATIVE 07/24/2016 1554   KETONESUR NEGATIVE 07/24/2016 1554   PROTEINUR NEGATIVE 07/24/2016 1554   UROBILINOGEN 0.2 09/03/2014 2025   NITRITE NEGATIVE 07/24/2016 1554   LEUKOCYTESUR LARGE (A) 07/24/2016 1554   Sepsis Labs Invalid input(s): PROCALCITONIN,  WBC,  LACTICIDVEN Microbiology Recent Results (from the past 240 hour(s))  Urine culture     Status: Abnormal   Collection Time: 07/24/16  3:54 PM  Result Value Ref Range Status   Specimen Description URINE, CLEAN CATCH  Final   Special Requests NONE  Final  Culture (A)  Final    <10,000 COLONIES/mL INSIGNIFICANT GROWTH Performed at [facility] [address], [place], [place] [address]    Report Status 07/26/2016 FINAL  Final  Blood Culture (routine x 2)     Status: None (Preliminary result)   Collection Time: 07/24/16  7:22 PM  Result Value Ref Range Status   Specimen Description BLOOD RIGHT ANTECUBITAL  Final   Special Requests BOTTLES DRAWN AEROBIC AND ANAEROBIC 5 CC  Final   Culture   Final    NO GROWTH 3 DAYS Performed at [facility], [place] [address], [place], [place] [address]    Report Status PENDING  Incomplete  Blood Culture (routine x 2)     Status: None (Preliminary result)   Collection Time: 07/24/16  8:32 PM  Result Value Ref Range Status   Specimen Description BLOOD RIGHT FOREARM  Final   Special  Requests BOTTLES DRAWN AEROBIC AND ANAEROBIC 5ML  Final   Culture   Final    NO GROWTH 3 DAYS Performed at [facility], [place] [address], [place], [place] [address]    Report Status PENDING  Incomplete  MRSA PCR Screening     Status: Abnormal   Collection Time: 07/25/16  9:44 AM  Result Value Ref Range Status   MRSA by PCR POSITIVE (A) NEGATIVE Final    Comment:        The GeneXpert MRSA Assay (FDA approved for NASAL specimens only), is one component of a comprehensive MRSA colonization surveillance program. It is not intended to diagnose MRSA infection nor to guide or monitor treatment for MRSA infections. RESULT CALLED TO, READ BACK BY AND VERIFIED WITH: MCCLEAN,C. RN AT 1430 07/25/16 BY THOMPSON,N.      Time coordinating discharge: Over 30 minutes  SIGNED:   Hosie Poisson, MD  [facility] 07/28/2016, 11:21 AM Pager   If 7PM-7AM, please contact night-coverage [email or web address] Password [email or web address]

## 2016-07-28 NOTE — Progress Notes (Signed)
Report called to [place].  Questions answered.  Transport to be called by Social Work

## 2016-07-28 NOTE — Clinical Social Work Note (Signed)
Medical Social Worker facilitated patient discharge including contacting patient family and facility to confirm patient discharge plans.  Clinical information faxed to facility and family agreeable with plan.  MSW arranged ambulance transport via [place] to [facility] and [place].  RN to call report prior to discharge.  Medical Social Worker will sign off for now as social work intervention is no longer needed. Please consult [place] again if new need arises.  Glendon Axe, MSW [phone] 07/28/2016 2:01 PM

## 2016-07-28 NOTE — Progress Notes (Signed)
ANTICOAGULATION CONSULT NOTE - Follow Up  Pharmacy Consult for Warfarin Indication: atrial fibrillation  No Known Allergies  Patient Measurements: Height: 5\' 6"  (167.6 cm) Weight: 125 lb 3.5 oz (56.8 kg) IBW/kg (Calculated) : 63.8  Vital Signs: Temp: 97.9 F (36.6 C) (01/19 0549) Temp Source: Oral (01/19 0549) BP: 162/95 (01/19 0549) Pulse Rate: 95 (01/19 0549)  Labs:  Recent Labs  07/26/16 0502 07/27/16 0454 07/28/16 0449  HGB 8.8*  --   --   HCT 26.9*  --   --   PLT 156  --   --   LABPROT 20.6* 22.5* 21.5*  INR 1.74 1.95 1.84  CREATININE 1.47*  --  1.30*    Estimated Creatinine Clearance: 27.3 mL/min (by C-G formula based on SCr of 1.3 mg/dL (H)).   Medical History: Past Medical History:  Diagnosis Date  . Alzheimer's disease, focal onset   . Atrial fibrillation ([place])   . CAD (coronary artery disease)   . Chronic atrial fibrillation ([place])   . High blood pressure   . Low back pain   . Memory loss   . Prostate cancer ([place])   . Recurrent major depression ([place])   . Stroke [facility])      Assessment: 83 yr male admitted with pneumonia and diarrhea. PMH significant for AFib (on warfarin), HTN and CKD. Pharmacy consulted to dose warfarin. PTA on warfarin 2mg  daily - Last dose on 1/14, INR upon admission = 1.61. Of note, warfarin dose recently lowered at his nursing home for successive INR's >3.   Today, 07/28/2016  INR remains subtherapeutic (1.84) today  Hgb low 8.8, Plts low at 156 (1/17)  No bleeding reported  Regular diet + Ensure TID ordered  No drug interactions other than broad-spectrum antibiotics noted  Goal of Therapy:  INR 2-3   Plan:   Warfarin 4mg  po x 1 dose at 10AM today  For discharge, recommend higher dose that PTA - 3mg  daily since INR subtherapeutic on admission  Check daily PT/INR   Peggyann Juba, PharmD, BCPS Pager: [phone] 07/28/2016,8:08 AM

## 2016-07-29 LAB — CULTURE, BLOOD (ROUTINE X 2)
CULTURE: NO GROWTH
Culture: NO GROWTH

## 2016-07-31 ENCOUNTER — Non-Acute Institutional Stay (SKILLED_NURSING_FACILITY): Payer: Medicare Other | Admitting: Adult Health

## 2016-07-31 ENCOUNTER — Encounter: Payer: Self-pay | Admitting: Adult Health

## 2016-07-31 DIAGNOSIS — I481 Persistent atrial fibrillation: Secondary | ICD-10-CM | POA: Diagnosis not present

## 2016-07-31 DIAGNOSIS — Z7901 Long term (current) use of anticoagulants: Secondary | ICD-10-CM

## 2016-07-31 DIAGNOSIS — N3281 Overactive bladder: Secondary | ICD-10-CM

## 2016-07-31 DIAGNOSIS — N183 Chronic kidney disease, stage 3 unspecified: Secondary | ICD-10-CM

## 2016-07-31 DIAGNOSIS — L89152 Pressure ulcer of sacral region, stage 2: Secondary | ICD-10-CM | POA: Diagnosis not present

## 2016-07-31 DIAGNOSIS — D638 Anemia in other chronic diseases classified elsewhere: Secondary | ICD-10-CM

## 2016-07-31 DIAGNOSIS — J189 Pneumonia, unspecified organism: Secondary | ICD-10-CM | POA: Diagnosis not present

## 2016-07-31 DIAGNOSIS — H409 Unspecified glaucoma: Secondary | ICD-10-CM

## 2016-07-31 DIAGNOSIS — I10 Essential (primary) hypertension: Secondary | ICD-10-CM | POA: Diagnosis not present

## 2016-07-31 DIAGNOSIS — R5381 Other malaise: Secondary | ICD-10-CM | POA: Diagnosis not present

## 2016-07-31 DIAGNOSIS — J309 Allergic rhinitis, unspecified: Secondary | ICD-10-CM

## 2016-07-31 DIAGNOSIS — I4819 Other persistent atrial fibrillation: Secondary | ICD-10-CM

## 2016-07-31 DIAGNOSIS — K5900 Constipation, unspecified: Secondary | ICD-10-CM

## 2016-07-31 DIAGNOSIS — F329 Major depressive disorder, single episode, unspecified: Secondary | ICD-10-CM

## 2016-07-31 DIAGNOSIS — N400 Enlarged prostate without lower urinary tract symptoms: Secondary | ICD-10-CM

## 2016-07-31 DIAGNOSIS — K219 Gastro-esophageal reflux disease without esophagitis: Secondary | ICD-10-CM

## 2016-07-31 NOTE — Progress Notes (Signed)
DATE:  07/31/2016   MRN:  [id number]  BIRTHDAY: April 22, 1919  Facility:  Nursing Home Location:  [place] Room Number: [id number]  LEVEL OF CARE:  SNF (31)  Contact Information    Name Relation Home Work [place] Daughter   [phone]   Jaclyn Shaggy   [phone]   Judithann Sheen   [phone]       Code Status History    Date Active Date Inactive Code Status Order ID Comments User Context   07/24/2016 11:29 PM 07/28/2016  5:55 PM Full Code [id number]  Vianne Bulls, MD ED   06/04/2016 10:51 PM 06/09/2016  9:00 PM Full Code [id number]  Norval Morton, MD Inpatient   09/04/2014 12:15 AM 09/08/2014  7:03 PM Full Code [id number]  Ivor Costa, MD ED   06/22/2014  9:49 PM 06/24/2014  9:11 PM Full Code [id number]  Meredith Pel, MD Inpatient   06/20/2014 10:53 PM 06/22/2014  9:49 PM Full Code [id number]  Jani Gravel, MD ED       Chief Complaint  Patient presents with  . Hospitalization Follow-up    HISTORY OF PRESENT ILLNESS:  This is a 27-YO male seen for hospital follow-up.  He was readmitted to [facility] on 07/28/2016 following an admission at [facility] 07/24/2016-07/28/2016 for  health care associated pneumonia. Blood cultures were negative. He was started on IV antibiotics then changed to oral antibiotics.     He was seen in the room and did not verbalize any concerns.   PAST MEDICAL HISTORY:  Past Medical History:  Diagnosis Date  . Alzheimer's disease, focal onset   . Atrial fibrillation ([place])   . CAD (coronary artery disease)   . Chronic atrial fibrillation ([place])   . High blood pressure   . Low back pain   . Memory loss   . Prostate cancer ([place])   . Recurrent major depression ([place])   . Stroke [facility])      CURRENT MEDICATIONS: Reviewed  Patient's Medications  New Prescriptions   No medications on file  Previous Medications   ACETAMINOPHEN (TYLENOL) 500 MG TABLET    Take 1,000 mg by mouth  every 8 (eight) hours as needed (pain). Maximum 3 tablets in 24 hours   ALBUTEROL (PROVENTIL) (2.5 MG/3ML) 0.083% NEBULIZER SOLUTION    Take 2.5 mg by nebulization every 6 (six) hours as needed for wheezing.    AMINO ACIDS-PROTEIN HYDROLYS (FEEDING SUPPLEMENT, PRO-STAT SUGAR FREE 64,) LIQD    Take 30 mLs by mouth 2 (two) times daily.   AMLODIPINE (NORVASC) 10 MG TABLET    Take 1 tablet (10 mg total) by mouth daily.   AMOXICILLIN-CLAVULANATE (AUGMENTIN) 875-125 MG TABLET    Take 1 tablet by mouth 2 (two) times daily.   BRIMONIDINE-TIMOLOL (COMBIGAN) 0.2-0.5 % OPHTHALMIC SOLUTION    Place 1 drop into both eyes every 12 (twelve) hours.   CARVEDILOL (COREG) 12.5 MG TABLET    Take 12.5 mg by mouth 2 (two) times daily with a meal.    DIPHENHYDRAMINE (BENYLIN) 12.5 MG/5ML SYRUP    Take 12.5 mg by mouth 3 (three) times daily as needed for itching.   DOCUSATE SODIUM (COLACE) 100 MG CAPSULE    Take 1 capsule (100 mg total) by mouth daily as needed for mild constipation.   DULOXETINE (CYMBALTA) 30 MG CAPSULE    Take 30 mg by mouth every evening.    FE FUM-FA-B CMP-C-ZN-MG-MN-CU (CENTRATEX) 106-1 MG  CAPS    Take 1 capsule by mouth daily with breakfast.    FLUTICASONE (FLONASE) 50 MCG/ACT NASAL SPRAY    Place 2 sprays into both nostrils at bedtime.   HYDROCODONE-ACETAMINOPHEN (NORCO/VICODIN) 5-325 MG TABLET    Take 1 tablet by mouth every 6 (six) hours as needed for severe pain.   IPRATROPIUM-ALBUTEROL (DUONEB) 0.5-2.5 (3) MG/3ML SOLN    Take 3 mLs by nebulization every 6 (six) hours as needed.   ISOSORBIDE-HYDRALAZINE (BIDIL) 20-37.5 MG PER TABLET    Take 1 tablet by mouth 2 (two) times daily.   LATANOPROST (XALATAN) 0.005 % OPHTHALMIC SOLUTION    Place 1 drop into both eyes at bedtime.   LOPERAMIDE (IMODIUM) 2 MG CAPSULE    Take 2 mg by mouth every 6 (six) hours as needed for diarrhea or loose stools.   MIRABEGRON ER (MYRBETRIQ) 25 MG TB24 TABLET    Take 25 mg by mouth daily with breakfast.    MIRTAZAPINE  (REMERON) 15 MG TABLET    Take 15 mg by mouth every evening.    MUPIROCIN OINTMENT (BACTROBAN) 2 %    Place 1 application into the nose 2 (two) times daily.   NEOMYCIN-BACITRACIN-POLYMYXIN (NEOSPORIN) OINTMENT    Apply 1 application topically as needed for wound care. apply to eye   NUTRITIONAL SUPPLEMENT LIQD    Take 1 each by mouth daily with lunch. *Magic Cup*   NUTRITIONAL SUPPLEMENTS (NUTRITIONAL DRINK) LIQD    Take 120 mLs by mouth 3 (three) times daily between meals. *Med Pass*   ONDANSETRON (ZOFRAN) 4 MG TABLET    Take 4 mg by mouth every 8 (eight) hours as needed for nausea or vomiting.    POLYETHYLENE GLYCOL (MIRALAX / GLYCOLAX) PACKET    Take 17 g by mouth daily as needed. Mix in 8 oz liquid and drink   RANITIDINE (ZANTAC) 150 MG TABLET    Take 150 mg by mouth at bedtime.    SENNA-DOCUSATE (SENOKOT-S) 8.6-50 MG TABLET    Take 2 tablets by mouth 2 (two) times daily.   TAMSULOSIN (FLOMAX) 0.4 MG CAPS CAPSULE    Take 0.4 mg by mouth every evening. 5pm    WARFARIN (COUMADIN) 2 MG TABLET    Take 2 mg by mouth at bedtime.  Modified Medications   No medications on file  Discontinued Medications   COLLAGEN HYDROLYSATE, BOVINE, POWD    Apply 1 application topically as needed (for wound care).   SILVER HYDROGEL GEL    Apply 1 application topically as needed (for wound care).     No Known Allergies   REVIEW OF SYSTEMS:  GENERAL: no change in appetite, no fatigue, no weight changes, no fever, chills or weakness EYES: Denies change in vision, dry eyes, eye pain, itching or discharge EARS: Denies change in hearing, ringing in ears, or earache NOSE: Denies nasal congestion or epistaxis MOUTH and THROAT: Denies oral discomfort, gingival pain or bleeding, pain from teeth or hoarseness   RESPIRATORY: no cough, SOB, DOE, wheezing, hemoptysis CARDIAC: no chest pain, edema or palpitations GI: no abdominal pain, diarrhea, constipation, heart burn, nausea or vomiting GU: Denies dysuria,  frequency, hematuria, incontinence, or discharge PSYCHIATRIC: Denies feeling of depression or anxiety. No report of hallucinations, insomnia, paranoia, or agitation    PHYSICAL EXAMINATION  GENERAL APPEARANCE:  In no acute distress. Normal body habitus SKIN:  Has sacral pressure ulcer, stage 2 HEAD: Normal in size and contour. No evidence of trauma EYES: Lids open and close normally. No  blepharitis, entropion or ectropion. PERRL. Conjunctivae are clear and sclerae are white. Lenses are without opacity EARS: Pinnae are normal.  MOUTH and THROAT: Lips are without lesions. Oral mucosa is moist and without lesions. Tongue is normal in shape, size, and color and without lesions NECK: supple, trachea midline, no neck masses, no thyroid tenderness, no thyromegaly LYMPHATICS: no LAN in the neck, no supraclavicular LAN RESPIRATORY: breathing is even & unlabored, BS CTAB CARDIAC: RRR, + murmur,no extra heart sounds, no edema GI: abdomen soft, normal BS, no masses, no tenderness, no hepatomegaly, no splenomegaly EXTREMITIES:  Able to move X 4 extremities PSYCHIATRIC: Alert to self, disoriented to time and place. Affect and behavior are appropriate  LABS/RADIOLOGY: Labs reviewed: Basic Metabolic Panel:  Recent Labs  07/25/16 0501 07/26/16 0502 07/28/16 0449  NA 142 141 141  K 3.5 3.7 4.1  CL 113* 114* 111  CO2 24 21* 23  GLUCOSE 105* 95 102*  BUN 42* 34* 24*  CREATININE 1.70* 1.47* 1.30*  CALCIUM 8.5* 8.8* 9.6  MG 1.7 1.7  --    Liver Function Tests:  Recent Labs  07/24/16 1914  AST 14*  ALT 12*  ALKPHOS 99  BILITOT 0.6  PROT 6.2*  ALBUMIN 3.0*    Recent Labs  07/24/16 1914  LIPASE 21   CBC:  Recent Labs  06/04/16 1748 06/05/16 0547  07/24/16 07/24/16 1914 07/25/16 0501 07/26/16 0502  WBC 5.9 5.1  < > 23.2 19.8* 13.6* 8.6  NEUTROABS 4.4 3.6  --  20  --   --   --   HGB 9.4* 8.8*  < > 10.6* 10.4* 8.7* 8.8*  HCT 28.7* 26.9*  < > 33* 32.1* 26.7* 26.9*  MCV 90.8  92.4  < >  --  92.5 94.7 94.7  PLT 197 178  < > 209 182 154 156  < > = values in this interval not displayed.  CBG:  Recent Labs  07/26/16 0800 07/27/16 0817 07/28/16 0717  GLUCAP 89 115* 114*    Dg Chest 2 View  Result Date: 07/24/2016 CLINICAL DATA:  Elevated white blood cell count. EXAM: CHEST  2 VIEW COMPARISON:  Chest x-ray dated 06/09/2016. FINDINGS: Mild cardiomegaly is stable. Median sternotomy wires appear intact and stable in alignment. Lungs are at least mildly hyperexpanded suggesting COPD. Coarse interstitial markings noted bilaterally, not significantly changed compared to previous exams suggesting chronic scarring/fibrosis. Slightly more confluent opacity is now seen at the left lung base. No pleural effusion or pneumothorax seen. IMPRESSION: 1. Increased opacity at the left lung base which could represent pneumonia, atelectasis or mild edema. If any associated fever, would favor pneumonia. 2. Hyperexpanded lungs suggesting some degree of COPD/emphysema. 3. Stable cardiomegaly.  No evidence of active CHF. Electronically Signed   By: Franki Cabot M.D.   On: 07/24/2016 21:07    ASSESSMENT/PLAN:  Physical deconditioning -  for rehabilitation, PT and OT, for therapeutic strengthening exercises; fall precautions  HCAP - he was started on IV antibiotics then changed to oral antibiotics, Augmentin was completed 07/29/16 and continue Duoneb PRN  Anemia of chronic disease - hgb 8.8, iron level 18 ; start FeSO4 325 mg 1 tab PO BID; re-check CBC on 08/04/16  Chronic renal failure, stage 3 - creatinine 1.3 ; was given IV fluids in the hospital; check BMP on 08/04/16  Persistent atrial fibrillation -  Rate-controlled; continue carvedilol 12.5 mg 1 tab by mouth twice a day and Coumadin  Hypertension - continue Bidil 1 tab PO BID,  amlodipine 10 mg 1 tab by mouth daily and carvedilol 12.5 mg 1 tab by mouth twice a day  Pressure ulcer on coccyx, stage 2 - continue wound treatment daily  and start decubivite 1 tab PO daily  Constipation - continue MiraLAX 17 g by mouth daily when necessary, senna S 2 tabs by mouth twice a day and Colace 100 mg 1 cap PO PRN  Glaucoma - no complaints of eye pain; Combigan 0.2% -0.5% 1 drop into both eyes every 12 hours, Xalatan 0.005% instill 1 drop each eye daily at bedtime  Overactive bladder - continue Myrbetriq ER 25 mg 1 tab by mouth daily  Allergic rhinitis - continue fluticasone 50 g 2 sprays into both nostrils daily at bedtime  GERD - continue ranitidine 150 mg 1 tab by mouth daily at bedtime   Depression - mood is stable; continue Remeron 15 mg 1 tab by mouth daily at bedtime and Cymbalta 30 mg 1 capsule by mouth daily   BPH - continue Flomax 0.4 mg 1 capsule by mouth daily   Long-term use of anticoagulant - INR 2.7, therapeutic; continue Coumadin 2 mg 1 tab by mouth daily, check INR on 08/04/16     Goals of care:  Short-term rehabilitation    Suzzette Gasparro C. [place] - NP [employer] [phone]

## 2016-08-01 ENCOUNTER — Non-Acute Institutional Stay (SKILLED_NURSING_FACILITY): Payer: Medicare Other | Admitting: Internal Medicine

## 2016-08-01 ENCOUNTER — Encounter: Payer: Self-pay | Admitting: Internal Medicine

## 2016-08-01 DIAGNOSIS — I482 Chronic atrial fibrillation, unspecified: Secondary | ICD-10-CM

## 2016-08-01 DIAGNOSIS — R5381 Other malaise: Secondary | ICD-10-CM | POA: Diagnosis not present

## 2016-08-01 DIAGNOSIS — N183 Chronic kidney disease, stage 3 unspecified: Secondary | ICD-10-CM

## 2016-08-01 DIAGNOSIS — L89152 Pressure ulcer of sacral region, stage 2: Secondary | ICD-10-CM | POA: Diagnosis not present

## 2016-08-01 DIAGNOSIS — J189 Pneumonia, unspecified organism: Secondary | ICD-10-CM | POA: Diagnosis not present

## 2016-08-01 DIAGNOSIS — E611 Iron deficiency: Secondary | ICD-10-CM

## 2016-08-01 NOTE — Progress Notes (Signed)
LOCATION: [place]  PCP: Melinda Crutch, MD   Code Status: Full Code  Goals of care: Advanced Directive information Advanced Directives 07/25/2016  Does Patient Have a Medical Advance Directive? No  Type of Advance Directive -  Copy of [place] in Chart? -  Would patient like information on creating a medical advance directive? No - Patient declined       Extended Emergency Contact Information Primary Emergency Contact: [facility] Address: [address] [place], [place] [address] [place] Mobile Phone: [phone] Relation: Daughter Secondary Emergency Contact: Marcum,Daniel Address: [address] [place], [place] [address] [place] Phone: [phone] Relation: Son   No Known Allergies  Chief Complaint  Patient presents with  . Readmit To SNF    Readmission Visit      HPI:  Patient is a 83 y.o. male seen today for short term rehabilitation post hospital re-admission from 07/24/2016-19th of January 2018 with left-sided healthcare associated pneumonia. He was started on IV antibiotic and later switched to oral antibiotic. He is seen in his room today.He has medical history of atrial fibrillation, hypertension, chronic kidney disease stage III, Alzheimer's dementia among others.  Review of Systems:  Constitutional: Negative for fever, chills, diaphoresis.  HENT: Negative for headache, congestion, nasal discharge, , difficulty swallowing.   Eyes: Negative for eye pain, blurred vision, double vision and discharge.  Respiratory: Negative for cough, shortness of breath and wheezing.   Cardiovascular: Negative for chest pain, palpitations, leg swelling.  Gastrointestinal: Negative for heartburn, nausea, vomiting, abdominal pain, loss of appetite Genitourinary: Negative for dysuria Musculoskeletal: Negative for back pain, fall.  Skin: Negative for itching, rash.  Neurological: Negative for  dizziness. Psychiatric/Behavioral: Negative for depression   Past Medical History:  Diagnosis Date  . Alzheimer's disease, focal onset   . Atrial fibrillation ([place])   . CAD (coronary artery disease)   . Chronic atrial fibrillation ([place])   . High blood pressure   . Low back pain   . Memory loss   . Prostate cancer ([place])   . Recurrent major depression ([place])   . Stroke [facility])    Past Surgical History:  Procedure Laterality Date  . CATARACT EXTRACTION    . CORONARY ARTERY BYPASS GRAFT    . FEMUR IM NAIL Right 06/05/2016   Procedure: INTRAMEDULLARY (IM) NAIL FEMORAL;  Surgeon: Mcarthur Rossetti, MD;  Location: [facility];  Service: Orthopedics;  Laterality: Right;  . HIP ARTHROPLASTY Left 06/22/2014   Procedure: LEFT HEMIARTHROPLASTY HIP;  Surgeon: Meredith Pel, MD;  Location: [facility];  Service: Orthopedics;  Laterality: Left;  . PROSTATECTOMY     Social History:   reports that he quit smoking about 60 years ago. His smoking use included Cigarettes. He has a 1.00 pack-year smoking history. He has never used smokeless tobacco. He reports that he does not drink alcohol or use drugs.  Family History  Problem Relation Age of Onset  . Cancer Father   . High blood pressure      Medications: Allergies as of 08/01/2016   No Known Allergies     Medication List       Accurate as of 08/01/16  3:03 PM. Always use your most recent med list.          albuterol (2.5 MG/3ML) 0.083% nebulizer solution Commonly known as:  PROVENTIL  Take 2.5 mg by nebulization every 6 (six) hours as needed for wheezing.   amLODipine 10 MG tablet Commonly known as:  NORVASC Take 1 tablet (10 mg total) by mouth daily.   brimonidine-timolol 0.2-0.5 % ophthalmic solution Commonly known as:  COMBIGAN Place 1 drop into both eyes every 12 (twelve) hours.   carvedilol 12.5 MG tablet Commonly known as:  COREG Take 12.5 mg by mouth 2 (two) times daily with a meal.   DECUBI-VITE PO Take 1 tablet by  mouth daily.   diphenhydrAMINE 12.5 MG/5ML syrup Commonly known as:  BENYLIN Take 12.5 mg by mouth 3 (three) times daily as needed for itching.   docusate sodium 100 MG capsule Commonly known as:  COLACE Take 1 capsule (100 mg total) by mouth daily as needed for mild constipation.   DULoxetine 30 MG capsule Commonly known as:  CYMBALTA Take 30 mg by mouth every evening.   feeding supplement (PRO-STAT SUGAR FREE 64) Liqd Take 30 mLs by mouth 2 (two) times daily.   ferrous sulfate 325 (65 FE) MG tablet Take 325 mg by mouth 2 (two) times daily.   fluticasone 50 MCG/ACT nasal spray Commonly known as:  FLONASE Place 2 sprays into both nostrils at bedtime.   HYDROcodone-acetaminophen 5-325 MG tablet Commonly known as:  NORCO/VICODIN Take 1 tablet by mouth every 6 (six) hours as needed for severe pain.   ipratropium-albuterol 0.5-2.5 (3) MG/3ML Soln Commonly known as:  DUONEB Take 3 mLs by nebulization every 6 (six) hours as needed.   isosorbide-hydrALAZINE 20-37.5 MG tablet Commonly known as:  BIDIL Take 1 tablet by mouth 2 (two) times daily.   latanoprost 0.005 % ophthalmic solution Commonly known as:  XALATAN Place 1 drop into both eyes at bedtime.   loperamide 2 MG capsule Commonly known as:  IMODIUM Take 2 mg by mouth every 6 (six) hours as needed for diarrhea or loose stools.   mirtazapine 15 MG tablet Commonly known as:  REMERON Take 15 mg by mouth every evening.   mupirocin ointment 2 % Commonly known as:  BACTROBAN Place 1 application into the nose 2 (two) times daily.   MYRBETRIQ 25 MG Tb24 tablet Generic drug:  mirabegron ER Take 25 mg by mouth daily with breakfast.   neomycin-bacitracin-polymyxin ointment Commonly known as:  NEOSPORIN Apply 1 application topically as needed for wound care. apply to eye   NUTRITIONAL SUPPLEMENT Liqd Take 1 each by mouth daily with lunch. *Magic Cup*   NUTRITIONAL DRINK Liqd Take 120 mLs by mouth 3 (three) times  daily between meals. *Med Pass*   ondansetron 4 MG tablet Commonly known as:  ZOFRAN Take 4 mg by mouth every 8 (eight) hours as needed for nausea or vomiting.   polyethylene glycol packet Commonly known as:  MIRALAX / GLYCOLAX Take 17 g by mouth daily as needed. Mix in 8 oz liquid and drink   ranitidine 150 MG tablet Commonly known as:  ZANTAC Take 150 mg by mouth at bedtime.   senna-docusate 8.6-50 MG tablet Commonly known as:  Senokot-S Take 2 tablets by mouth 2 (two) times daily.   tamsulosin 0.4 MG Caps capsule Commonly known as:  FLOMAX Take 0.4 mg by mouth every evening. 5pm   warfarin 2 MG tablet Commonly known as:  COUMADIN Take 2 mg by mouth at bedtime.       Immunizations: Immunization History  Administered Date(s) Administered  . PPD Test 06/10/2016, 06/24/2016     Physical Exam: Vitals:   08/01/16  1455  BP: (!) 155/88  Pulse: 81  Resp: 18  Temp: 98.1 F (36.7 C)  TempSrc: Oral  SpO2: 99%  Weight: 122 lb (55.3 kg)  Height: 5\' 8"  (1.727 m)   Body mass index is 18.55 kg/m.  General- elderly Frail male, in no acute distress Head- normocephalic, atraumatic Throat- moist mucus membrane Eyes- PERRLA, EOMI, no pallor, no icterus Neck- no cervical lymphadenopathy Cardiovascular- irregular heart rate, no murmur Respiratory- bilateral clear to auscultation, no wheeze, no rhonchi, no crackles, no use of accessory muscles Abdomen- bowel sounds present, soft, non tender, no guarding or rigidity Musculoskeletal- able to move all 4 extremities, on wheelchair, generalized weakness, no leg edema Neurological- alert and oriented to self only Skin- warm and dry Psychiatry- normal mood and affect    Labs reviewed: Basic Metabolic Panel:  Recent Labs  07/25/16 0501 07/26/16 0502 07/28/16 0449  NA 142 141 141  K 3.5 3.7 4.1  CL 113* 114* 111  CO2 24 21* 23  GLUCOSE 105* 95 102*  BUN 42* 34* 24*  CREATININE 1.70* 1.47* 1.30*  CALCIUM 8.5* 8.8*  9.6  MG 1.7 1.7  --    Liver Function Tests:  Recent Labs  07/24/16 1914  AST 14*  ALT 12*  ALKPHOS 99  BILITOT 0.6  PROT 6.2*  ALBUMIN 3.0*    Recent Labs  07/24/16 1914  LIPASE 21   No results for input(s): AMMONIA in the last 8760 hours. CBC:  Recent Labs  06/04/16 1748 06/05/16 0547  07/24/16 07/24/16 1914 07/25/16 0501 07/26/16 0502  WBC 5.9 5.1  < > 23.2 19.8* 13.6* 8.6  NEUTROABS 4.4 3.6  --  20  --   --   --   HGB 9.4* 8.8*  < > 10.6* 10.4* 8.7* 8.8*  HCT 28.7* 26.9*  < > 33* 32.1* 26.7* 26.9*  MCV 90.8 92.4  < >  --  92.5 94.7 94.7  PLT 197 178  < > 209 182 154 156  < > = values in this interval not displayed. Cardiac Enzymes: No results for input(s): CKTOTAL, CKMB, CKMBINDEX, TROPONINI in the last 8760 hours. BNP: Invalid input(s): POCBNP CBG:  Recent Labs  07/26/16 0800 07/27/16 0817 07/28/16 0717  GLUCAP 89 115* 114*    Radiological Exams: Dg Chest 2 View  Result Date: 07/24/2016 CLINICAL DATA:  Elevated white blood cell count. EXAM: CHEST  2 VIEW COMPARISON:  Chest x-ray dated 06/09/2016. FINDINGS: Mild cardiomegaly is stable. Median sternotomy wires appear intact and stable in alignment. Lungs are at least mildly hyperexpanded suggesting COPD. Coarse interstitial markings noted bilaterally, not significantly changed compared to previous exams suggesting chronic scarring/fibrosis. Slightly more confluent opacity is now seen at the left lung base. No pleural effusion or pneumothorax seen. IMPRESSION: 1. Increased opacity at the left lung base which could represent pneumonia, atelectasis or mild edema. If any associated fever, would favor pneumonia. 2. Hyperexpanded lungs suggesting some degree of COPD/emphysema. 3. Stable cardiomegaly.  No evidence of active CHF. Electronically Signed   By: Franki Cabot M.D.   On: 07/24/2016 21:07    Assessment/Plan  Physical deconditioning With generalized weakness.Will have patient work with PT/OT as  tolerated to regain strength and restore function.  Fall precautions are in place.  Healthcare acquired pneumonia Completed antibiotic treatment on 07/30/2016. Currently breathing has been stable. Continue to nap on a needed basis.  Iron deficiency anemia Started on iron supplements, monitor CBC periodically  Chronic kidney disease stage III Had acute on  chronic kidney disease in the hospital and required IV fluids. Maintain hydration. Monitor BMP  Chronic atrial fibrillation Controlled rate. Continue Coumadin and carvedilol  Stage II pressure ulcer coccyx Continue to keep be wide and wound care. Pressure ulcer prophylaxis to be taken.    Goals of care: short term rehabilitation   Labs/tests ordered: cbc, bmp  Family/ staff Communication: reviewed care plan with patient and nursing supervisor    Blanchie Serve, MD Internal Medicine [place], [place] [address] Cell Phone (Monday-Friday 8 am - 5 pm): [phone] On Call: [phone] and follow prompts after 5 pm and on weekends Office Phone: [phone] Office Fax: [phone]

## 2016-08-04 LAB — BASIC METABOLIC PANEL
BUN: 17 mg/dL (ref 4–21)
Creatinine: 1.3 mg/dL (ref 0.6–1.3)
Glucose: 86 mg/dL
Potassium: 4.3 mmol/L (ref 3.4–5.3)
Sodium: 144 mmol/L (ref 137–147)

## 2016-08-04 LAB — CBC AND DIFFERENTIAL
HEMATOCRIT: 30 % — AB (ref 41–53)
HEMOGLOBIN: 9.4 g/dL — AB (ref 13.5–17.5)
PLATELETS: 197 10*3/uL (ref 150–399)
WBC: 6.6 10*3/mL

## 2016-08-16 ENCOUNTER — Ambulatory Visit (INDEPENDENT_AMBULATORY_CARE_PROVIDER_SITE_OTHER): Payer: Medicare Other | Admitting: Orthopaedic Surgery

## 2016-08-18 DIAGNOSIS — L89102 Pressure ulcer of unspecified part of back, stage 2: Secondary | ICD-10-CM | POA: Diagnosis not present

## 2016-08-18 LAB — BASIC METABOLIC PANEL
BUN: 55 mg/dL — AB (ref 4–21)
Creatinine: 1.9 mg/dL — AB (ref 0.6–1.3)
Glucose: 63 mg/dL
POTASSIUM: 4.2 mmol/L (ref 3.4–5.3)
SODIUM: 146 mmol/L (ref 137–147)

## 2016-08-18 LAB — CBC AND DIFFERENTIAL
HEMATOCRIT: 30 % — AB (ref 41–53)
HEMOGLOBIN: 9.4 g/dL — AB (ref 13.5–17.5)
Platelets: 134 10*3/uL — AB (ref 150–399)
WBC: 16.2 10^3/mL

## 2016-08-21 ENCOUNTER — Non-Acute Institutional Stay (SKILLED_NURSING_FACILITY): Payer: Medicare Other | Admitting: Adult Health

## 2016-08-21 ENCOUNTER — Encounter: Payer: Self-pay | Admitting: Adult Health

## 2016-08-21 DIAGNOSIS — Z7901 Long term (current) use of anticoagulants: Secondary | ICD-10-CM

## 2016-08-21 DIAGNOSIS — N183 Chronic kidney disease, stage 3 (moderate): Secondary | ICD-10-CM | POA: Diagnosis not present

## 2016-08-21 DIAGNOSIS — N179 Acute kidney failure, unspecified: Secondary | ICD-10-CM | POA: Diagnosis not present

## 2016-08-21 NOTE — Progress Notes (Signed)
Patient ID: Kurt Thomas, male   DOB: 06/02/1919, 83 y.o.   MRN: [id number]    DATE:   08/21/2016   MRN:  [id number]  BIRTHDAY: 08-Apr-1919  Facility:  Nursing Home Location:  [place] Room Number: [id number]  LEVEL OF CARE:  SNF (31)  Contact Information    Name Relation Home Work [place] Daughter   [phone]   Jaclyn Shaggy   [phone]   Judithann Sheen   [phone]       Code Status History    Date Active Date Inactive Code Status Order ID Comments User Context   07/24/2016 11:29 PM 07/28/2016  5:55 PM Full Code [id number]  Vianne Bulls, MD ED   06/04/2016 10:51 PM 06/09/2016  9:00 PM Full Code [id number]  Norval Morton, MD Inpatient   09/04/2014 12:15 AM 09/08/2014  7:03 PM Full Code [id number]  Ivor Costa, MD ED   06/22/2014  9:49 PM 06/24/2014  9:11 PM Full Code [id number]  Meredith Pel, MD Inpatient   06/20/2014 10:53 PM 06/22/2014  9:49 PM Full Code [id number]  Jani Gravel, MD ED       Chief Complaint  Patient presents with  . Acute Visit    Acute on CKD    HISTORY OF PRESENT ILLNESS:  This is a 82-YO male seen for an acute visit. He was noted to have creatinine of 1.87 and BUN 55. Latest INR 1.6, subtherapeutic. He takes Coumadin for his atrial fibrillation.  He was readmitted to [facility] on 07/28/2016 following an admission at [facility] 07/24/2016-07/28/2016 for  health care associated pneumonia. Blood cultures were negative. He was started on IV antibiotics then changed to oral antibiotics.      PAST MEDICAL HISTORY:  Past Medical History:  Diagnosis Date  . Alzheimer's disease, focal onset   . Atrial fibrillation ([place])   . CAD (coronary artery disease)   . Chronic atrial fibrillation ([place])   . High blood pressure   . Low back pain   . Memory loss   . Prostate cancer ([place])   . Recurrent major depression ([place])   . Stroke [facility])      CURRENT MEDICATIONS: Reviewed  Patient's  Medications  New Prescriptions   No medications on file  Previous Medications   ALBUTEROL (PROVENTIL) (2.5 MG/3ML) 0.083% NEBULIZER SOLUTION    Take 2.5 mg by nebulization every 6 (six) hours as needed for wheezing.    AMINO ACIDS-PROTEIN HYDROLYS (FEEDING SUPPLEMENT, PRO-STAT SUGAR FREE 64,) LIQD    Take 30 mLs by mouth 2 (two) times daily.   AMLODIPINE (NORVASC) 10 MG TABLET    Take 1 tablet (10 mg total) by mouth daily.   BRIMONIDINE-TIMOLOL (COMBIGAN) 0.2-0.5 % OPHTHALMIC SOLUTION    Place 1 drop into both eyes every 12 (twelve) hours.   CARVEDILOL (COREG) 12.5 MG TABLET    Take 12.5 mg by mouth 2 (two) times daily with a meal.    DIPHENHYDRAMINE (BENYLIN) 12.5 MG/5ML SYRUP    Take 12.5 mg by mouth 3 (three) times daily as needed for itching.   DOCUSATE SODIUM (COLACE) 100 MG CAPSULE    Take 1 capsule (100 mg total) by mouth daily as needed for mild constipation.   DULOXETINE (CYMBALTA) 30 MG CAPSULE    Take 30 mg by mouth every evening.    FERROUS SULFATE 325 (65 FE) MG TABLET    Take 325 mg by mouth 2 (two) times daily.  FLUTICASONE (FLONASE) 50 MCG/ACT NASAL SPRAY    Place 2 sprays into both nostrils at bedtime.   HYDROCODONE-ACETAMINOPHEN (NORCO/VICODIN) 5-325 MG TABLET    Take 1 tablet by mouth every 6 (six) hours as needed for severe pain.   IPRATROPIUM-ALBUTEROL (DUONEB) 0.5-2.5 (3) MG/3ML SOLN    Take 3 mLs by nebulization every 6 (six) hours as needed.   ISOSORBIDE-HYDRALAZINE (BIDIL) 20-37.5 MG PER TABLET    Take 1 tablet by mouth 2 (two) times daily.   LATANOPROST (XALATAN) 0.005 % OPHTHALMIC SOLUTION    Place 1 drop into both eyes at bedtime.   LOPERAMIDE (IMODIUM) 2 MG CAPSULE    Take 2-4 mg by mouth as needed for diarrhea or loose stools. Give 4 mg initially, followed by 2 mg after each episode of diarrhea per facility protocol.  May take 2 mg q6h prn.  Do not exceed 16 mg/qd   MIRABEGRON ER (MYRBETRIQ) 25 MG TB24 TABLET    Take 25 mg by mouth daily with breakfast.     MIRTAZAPINE (REMERON) 15 MG TABLET    Take 15 mg by mouth at bedtime.    MULTIPLE VITAMINS-MINERALS (DECUBI-VITE PO)    Take 1 tablet by mouth daily.   NEOMYCIN-BACITRACIN-POLYMYXIN (NEOSPORIN) OINTMENT    Apply 1 application topically as needed for wound care. apply to eye wound   NUTRITIONAL SUPPLEMENT LIQD    Take 1 each by mouth daily with lunch. *Magic Cup*   NUTRITIONAL SUPPLEMENTS (NUTRITIONAL DRINK) LIQD    Take 120 mLs by mouth 3 (three) times daily between meals. *Med Pass*   ONDANSETRON (ZOFRAN) 4 MG TABLET    Take 4 mg by mouth every 8 (eight) hours as needed for nausea or vomiting.    POLYETHYLENE GLYCOL (MIRALAX / GLYCOLAX) PACKET    Take 17 g by mouth daily as needed. Mix in 8 oz liquid and drink   RANITIDINE (ZANTAC) 150 MG TABLET    Take 150 mg by mouth at bedtime.    SENNA-DOCUSATE (SENOKOT-S) 8.6-50 MG TABLET    Take 2 tablets by mouth 2 (two) times daily.    TAMSULOSIN (FLOMAX) 0.4 MG CAPS CAPSULE    Take 0.4 mg by mouth every evening. 5pm    WARFARIN (COUMADIN) 5 MG TABLET    Take 5 mg by mouth daily.  Modified Medications   No medications on file  Discontinued Medications   MUPIROCIN OINTMENT (BACTROBAN) 2 %    Place 1 application into the nose 2 (two) times daily.   WARFARIN (COUMADIN) 2 MG TABLET    Take 2 mg by mouth at bedtime.   WARFARIN (COUMADIN) 6 MG TABLET    Take 6 mg by mouth daily. Take one dose of 7.5 08/21/16, then 6 mg qd     No Known Allergies   REVIEW OF SYSTEMS:  GENERAL: no change in appetite, no fatigue, no weight changes, no fever, chills or weakness EYES: Denies change in vision, dry eyes, eye pain, itching or discharge EARS: Denies change in hearing, ringing in ears, or earache NOSE: Denies nasal congestion or epistaxis MOUTH and THROAT: Denies oral discomfort, gingival pain or bleeding, pain from teeth or hoarseness   RESPIRATORY: no cough, SOB, DOE, wheezing, hemoptysis CARDIAC: no chest pain, edema or palpitations GI: no abdominal pain,  diarrhea, constipation, heart burn, nausea or vomiting GU: Denies dysuria, frequency, hematuria, incontinence, or discharge PSYCHIATRIC: Denies feeling of depression or anxiety. No report of hallucinations, insomnia, paranoia, or agitation    PHYSICAL EXAMINATION  GENERAL  APPEARANCE:  In no acute distress. Normal body habitus SKIN:  Has sacral pressure ulcer, stage 2 HEAD: Normal in size and contour. No evidence of trauma EYES: Lids open and close normally. No blepharitis, entropion or ectropion. PERRL. Conjunctivae are clear and sclerae are white. Lenses are without opacity EARS: Pinnae are normal.  MOUTH and THROAT: Lips are without lesions. Oral mucosa is moist and without lesions. Tongue is normal in shape, size, and color and without lesions NECK: supple, trachea midline, no neck masses, no thyroid tenderness, no thyromegaly LYMPHATICS: no LAN in the neck, no supraclavicular LAN RESPIRATORY: breathing is even & unlabored, BS CTAB CARDIAC: RRR, + murmur,no extra heart sounds, no edema GI: abdomen soft, normal BS, no masses, no tenderness, no hepatomegaly, no splenomegaly EXTREMITIES:  Able to move X 4 extremities PSYCHIATRIC: Alert to self, disoriented to time and place. Affect and behavior are appropriate  LABS/RADIOLOGY: Labs reviewed: Basic Metabolic Panel:  Recent Labs  07/25/16 0501 07/26/16 0502 07/28/16 0449 08/04/16 08/18/16  NA 142 141 141 144 146  K 3.5 3.7 4.1 4.3 4.2  CL 113* 114* 111  --   --   CO2 24 21* 23  --   --   GLUCOSE 105* 95 102*  --   --   BUN 42* 34* 24* 17 55*  CREATININE 1.70* 1.47* 1.30* 1.3 1.9*  CALCIUM 8.5* 8.8* 9.6  --   --   MG 1.7 1.7  --   --   --    Liver Function Tests:  Recent Labs  07/24/16 1914  AST 14*  ALT 12*  ALKPHOS 99  BILITOT 0.6  PROT 6.2*  ALBUMIN 3.0*    Recent Labs  07/24/16 1914  LIPASE 21   CBC:  Recent Labs  06/04/16 1748 06/05/16 0547  07/24/16 07/24/16 1914 07/25/16 0501 07/26/16 0502  08/04/16 08/18/16  WBC 5.9 5.1  < > 23.2 19.8* 13.6* 8.6 6.6 16.2  NEUTROABS 4.4 3.6  --  20  --   --   --   --   --   HGB 9.4* 8.8*  < > 10.6* 10.4* 8.7* 8.8* 9.4* 9.4*  HCT 28.7* 26.9*  < > 33* 32.1* 26.7* 26.9* 30* 30*  MCV 90.8 92.4  < >  --  92.5 94.7 94.7  --   --   PLT 197 178  < > 209 182 154 156 197 134*  < > = values in this interval not displayed.  CBG:  Recent Labs  07/26/16 0800 07/27/16 0817 07/28/16 0717  GLUCAP 89 115* 114*     ASSESSMENT/PLAN:  Acute on chronic kidney disease stage 3- creatinine 1.87;  Start D5 1/2 NS @ 100 cc/hour X 1L via IV; CBC and BMP on 08/20/16  Long-term use of anticoagulant - INR 1.6, subtherapeutic; give Coumadin 7.5 mg PO X 1 then Coumadin 6 mg PO Q D; INR 08/24/16     Monina C. [place] - NP [employer] [phone]

## 2016-08-25 DIAGNOSIS — R1312 Dysphagia, oropharyngeal phase: Secondary | ICD-10-CM | POA: Diagnosis not present

## 2016-08-25 DIAGNOSIS — L89153 Pressure ulcer of sacral region, stage 3: Secondary | ICD-10-CM | POA: Diagnosis not present

## 2016-08-25 DIAGNOSIS — M6281 Muscle weakness (generalized): Secondary | ICD-10-CM | POA: Diagnosis not present

## 2016-08-25 DIAGNOSIS — N183 Chronic kidney disease, stage 3 (moderate): Secondary | ICD-10-CM | POA: Diagnosis not present

## 2016-09-01 DIAGNOSIS — L89153 Pressure ulcer of sacral region, stage 3: Secondary | ICD-10-CM | POA: Diagnosis not present

## 2016-09-01 DIAGNOSIS — R1312 Dysphagia, oropharyngeal phase: Secondary | ICD-10-CM | POA: Diagnosis not present

## 2016-09-01 DIAGNOSIS — N183 Chronic kidney disease, stage 3 (moderate): Secondary | ICD-10-CM | POA: Diagnosis not present

## 2016-09-01 DIAGNOSIS — M6281 Muscle weakness (generalized): Secondary | ICD-10-CM | POA: Diagnosis not present

## 2016-09-08 ENCOUNTER — Non-Acute Institutional Stay (SKILLED_NURSING_FACILITY): Payer: Medicare Other | Admitting: Adult Health

## 2016-09-08 ENCOUNTER — Encounter (HOSPITAL_BASED_OUTPATIENT_CLINIC_OR_DEPARTMENT_OTHER): Payer: Medicare Other | Attending: Internal Medicine

## 2016-09-08 ENCOUNTER — Encounter: Payer: Self-pay | Admitting: Adult Health

## 2016-09-08 DIAGNOSIS — N183 Chronic kidney disease, stage 3 unspecified: Secondary | ICD-10-CM

## 2016-09-08 DIAGNOSIS — J189 Pneumonia, unspecified organism: Secondary | ICD-10-CM

## 2016-09-08 DIAGNOSIS — N400 Enlarged prostate without lower urinary tract symptoms: Secondary | ICD-10-CM

## 2016-09-08 DIAGNOSIS — H409 Unspecified glaucoma: Secondary | ICD-10-CM

## 2016-09-08 DIAGNOSIS — I10 Essential (primary) hypertension: Secondary | ICD-10-CM

## 2016-09-08 DIAGNOSIS — K5900 Constipation, unspecified: Secondary | ICD-10-CM | POA: Diagnosis not present

## 2016-09-08 DIAGNOSIS — F329 Major depressive disorder, single episode, unspecified: Secondary | ICD-10-CM

## 2016-09-08 DIAGNOSIS — J309 Allergic rhinitis, unspecified: Secondary | ICD-10-CM

## 2016-09-08 DIAGNOSIS — K219 Gastro-esophageal reflux disease without esophagitis: Secondary | ICD-10-CM

## 2016-09-08 DIAGNOSIS — I4819 Other persistent atrial fibrillation: Secondary | ICD-10-CM

## 2016-09-08 DIAGNOSIS — D638 Anemia in other chronic diseases classified elsewhere: Secondary | ICD-10-CM

## 2016-09-08 DIAGNOSIS — R5381 Other malaise: Secondary | ICD-10-CM

## 2016-09-08 DIAGNOSIS — I481 Persistent atrial fibrillation: Secondary | ICD-10-CM | POA: Diagnosis not present

## 2016-09-08 DIAGNOSIS — N3281 Overactive bladder: Secondary | ICD-10-CM

## 2016-09-08 DIAGNOSIS — L89152 Pressure ulcer of sacral region, stage 2: Secondary | ICD-10-CM

## 2016-09-08 NOTE — Progress Notes (Signed)
Patient ID: Kurt Thomas, male   DOB: 1918-08-11, 83 y.o.   MRN: [id number]    DATE:    09/08/16  MRN:  [id number]  BIRTHDAY: 05-17-1919  Facility:  Nursing Home Location:  [place] Room Number: [id number]  LEVEL OF CARE:  SNF (31)  Contact Information    Name Relation Home Work [place] Daughter   [phone]   Jaclyn Shaggy   [phone]   Judithann Sheen   [phone]       Code Status History    Date Active Date Inactive Code Status Order ID Comments User Context   07/24/2016 11:29 PM 07/28/2016  5:55 PM Full Code [id number]  Vianne Bulls, MD ED   06/04/2016 10:51 PM 06/09/2016  9:00 PM Full Code [id number]  Norval Morton, MD Inpatient   09/04/2014 12:15 AM 09/08/2014  7:03 PM Full Code [id number]  Ivor Costa, MD ED   06/22/2014  9:49 PM 06/24/2014  9:11 PM Full Code [id number]  Meredith Pel, MD Inpatient   06/20/2014 10:53 PM 06/22/2014  9:49 PM Full Code [id number]  Jani Gravel, MD ED       Chief Complaint  Patient presents with  . Discharge Note    HISTORY OF PRESENT ILLNESS:  This is a 55-YO male who is for discharge to ALF with Home health PT, OT, CNA and Nursing.   He was readmitted to [facility] on 07/28/2016 following an admission at [facility] 07/24/2016-07/28/2016 for  health care associated pneumonia. Blood cultures were negative. He was started on IV antibiotics then changed to oral antibiotics.     Patient was admitted to this facility for short-term rehabilitation after the patient's recent hospitalization.  Patient has completed SNF rehabilitation and therapy has cleared the patient for discharge.   PAST MEDICAL HISTORY:  Past Medical History:  Diagnosis Date  . Alzheimer's disease, focal onset   . Atrial fibrillation ([place])   . CAD (coronary artery disease)   . Chronic atrial fibrillation ([place])   . High blood pressure   . Low back pain   . Memory loss   . Prostate cancer ([place])   .  Recurrent major depression ([place])   . Stroke [facility])      CURRENT MEDICATIONS: Reviewed  Patient's Medications  New Prescriptions   No medications on file  Previous Medications   ALBUTEROL (PROVENTIL) (2.5 MG/3ML) 0.083% NEBULIZER SOLUTION    Take 2.5 mg by nebulization every 6 (six) hours as needed for wheezing.    AMINO ACIDS-PROTEIN HYDROLYS (FEEDING SUPPLEMENT, PRO-STAT SUGAR FREE 64,) LIQD    Take 30 mLs by mouth 2 (two) times daily.   AMLODIPINE (NORVASC) 10 MG TABLET    Take 1 tablet (10 mg total) by mouth daily.   BRIMONIDINE-TIMOLOL (COMBIGAN) 0.2-0.5 % OPHTHALMIC SOLUTION    Place 1 drop into both eyes every 12 (twelve) hours.   CARVEDILOL (COREG) 12.5 MG TABLET    Take 12.5 mg by mouth 2 (two) times daily with a meal.    DIPHENHYDRAMINE (BENYLIN) 12.5 MG/5ML SYRUP    Take 12.5 mg by mouth 3 (three) times daily as needed for itching.   DOCUSATE SODIUM (COLACE) 100 MG CAPSULE    Take 1 capsule (100 mg total) by mouth daily as needed for mild constipation.   DULOXETINE (CYMBALTA) 30 MG CAPSULE    Take 30 mg by mouth every evening.    FERROUS SULFATE 325 (65 FE) MG TABLET  Take 325 mg by mouth 2 (two) times daily.   FLUTICASONE (FLONASE) 50 MCG/ACT NASAL SPRAY    Place 2 sprays into both nostrils at bedtime.   HYDROCODONE-ACETAMINOPHEN (NORCO/VICODIN) 5-325 MG TABLET    Take 1 tablet by mouth every 6 (six) hours as needed for severe pain.   IPRATROPIUM-ALBUTEROL (DUONEB) 0.5-2.5 (3) MG/3ML SOLN    Take 3 mLs by nebulization every 6 (six) hours as needed.   ISOSORBIDE-HYDRALAZINE (BIDIL) 20-37.5 MG PER TABLET    Take 1 tablet by mouth 2 (two) times daily.   LATANOPROST (XALATAN) 0.005 % OPHTHALMIC SOLUTION    Place 1 drop into both eyes at bedtime.   LOPERAMIDE (IMODIUM) 2 MG CAPSULE    Take 2-4 mg by mouth as needed for diarrhea or loose stools. Give 4 mg initially, followed by 2 mg after each episode of diarrhea per facility protocol.  May take 2 mg q6h prn.  Do not exceed 16 mg/qd    MIRABEGRON ER (MYRBETRIQ) 25 MG TB24 TABLET    Take 25 mg by mouth daily with breakfast.    MIRTAZAPINE (REMERON) 15 MG TABLET    Take 15 mg by mouth at bedtime.    MULTIPLE VITAMINS-MINERALS (DECUBI-VITE PO)    Take 1 tablet by mouth daily.   NEOMYCIN-BACITRACIN-POLYMYXIN (NEOSPORIN) OINTMENT    Apply 1 application topically as needed for wound care. apply to eye wound   NUTRITIONAL SUPPLEMENT LIQD    Take 1 each by mouth daily with lunch. *Magic Cup*   NUTRITIONAL SUPPLEMENTS (NUTRITIONAL DRINK) LIQD    Take 120 mLs by mouth 3 (three) times daily between meals. *Med Pass*   ONDANSETRON (ZOFRAN) 4 MG TABLET    Take 4 mg by mouth every 8 (eight) hours as needed for nausea or vomiting.    POLYETHYLENE GLYCOL (MIRALAX / GLYCOLAX) PACKET    Take 17 g by mouth daily as needed. Mix in 8 oz liquid and drink   RANITIDINE (ZANTAC) 150 MG TABLET    Take 150 mg by mouth at bedtime.    SENNA-DOCUSATE (SENOKOT-S) 8.6-50 MG TABLET    Take 2 tablets by mouth 2 (two) times daily.    TAMSULOSIN (FLOMAX) 0.4 MG CAPS CAPSULE    Take 0.4 mg by mouth every evening. 5pm    WARFARIN (COUMADIN) 5 MG TABLET    Take 5 mg by mouth daily.  Modified Medications   No medications on file  Discontinued Medications   WARFARIN (COUMADIN) 6 MG TABLET    Take 6 mg by mouth daily. Take one dose of 7.5 08/21/16, then 6 mg qd     No Known Allergies   REVIEW OF SYSTEMS:  GENERAL: no change in appetite, no fatigue, no weight changes, no fever, chills or weakness EYES: Denies change in vision, dry eyes, eye pain, itching or discharge EARS: Denies change in hearing, ringing in ears, or earache NOSE: Denies nasal congestion or epistaxis MOUTH and THROAT: Denies oral discomfort, gingival pain or bleeding, pain from teeth or hoarseness   RESPIRATORY: no cough, SOB, DOE, wheezing, hemoptysis CARDIAC: no chest pain, edema or palpitations GI: no abdominal pain, diarrhea, constipation, heart burn, nausea or vomiting GU: Denies  dysuria, frequency, hematuria, incontinence, or discharge PSYCHIATRIC: Denies feeling of depression or anxiety. No report of hallucinations, insomnia, paranoia, or agitation    PHYSICAL EXAMINATION  GENERAL APPEARANCE:  In no acute distress. Normal body habitus SKIN:  Has sacral pressure ulcer, stage 2 HEAD: Normal in size and contour. No evidence of  trauma EYES: Lids open and close normally. No blepharitis, entropion or ectropion. PERRL. Conjunctivae are clear and sclerae are white. Lenses are without opacity EARS: Pinnae are normal.  MOUTH and THROAT: Lips are without lesions. Oral mucosa is moist and without lesions. Tongue is normal in shape, size, and color and without lesions NECK: supple, trachea midline, no neck masses, no thyroid tenderness, no thyromegaly LYMPHATICS: no LAN in the neck, no supraclavicular LAN RESPIRATORY: breathing is even & unlabored, BS CTAB CARDIAC: RRR, + murmur,no extra heart sounds, no edema GI: abdomen soft, normal BS, no masses, no tenderness, no hepatomegaly, no splenomegaly EXTREMITIES:  Able to move X 4 extremities PSYCHIATRIC: Alert to self, disoriented to time and place. Affect and behavior are appropriate  LABS/RADIOLOGY: Labs reviewed:  08/21/16  WBC 5.5 hemoglobin 9.7 hematocrit 30.7 MCV 99 platelet 147 sodium 143 K4.6  glucose 86 BUN 42 creatinine 1.54 calcium 8.9 GFR [id number] Basic Metabolic Panel:  Recent Labs  07/25/16 0501 07/26/16 0502 07/28/16 0449 08/04/16 08/18/16  NA 142 141 141 144 146  K 3.5 3.7 4.1 4.3 4.2  CL 113* 114* 111  --   --   CO2 24 21* 23  --   --   GLUCOSE 105* 95 102*  --   --   BUN 42* 34* 24* 17 55*  CREATININE 1.70* 1.47* 1.30* 1.3 1.9*  CALCIUM 8.5* 8.8* 9.6  --   --   MG 1.7 1.7  --   --   --    Liver Function Tests:  Recent Labs  07/24/16 1914  AST 14*  ALT 12*  ALKPHOS 99  BILITOT 0.6  PROT 6.2*  ALBUMIN 3.0*    Recent Labs  07/24/16 1914  LIPASE 21   CBC:  Recent Labs  06/04/16 1748  06/05/16 0547  07/24/16 07/24/16 1914 07/25/16 0501 07/26/16 0502 08/04/16 08/18/16  WBC 5.9 5.1  < > 23.2 19.8* 13.6* 8.6 6.6 16.2  NEUTROABS 4.4 3.6  --  20  --   --   --   --   --   HGB 9.4* 8.8*  < > 10.6* 10.4* 8.7* 8.8* 9.4* 9.4*  HCT 28.7* 26.9*  < > 33* 32.1* 26.7* 26.9* 30* 30*  MCV 90.8 92.4  < >  --  92.5 94.7 94.7  --   --   PLT 197 178  < > 209 182 154 156 197 134*  < > = values in this interval not displayed.  CBG:  Recent Labs  07/26/16 0800 07/27/16 0817 07/28/16 0717  GLUCAP 89 115* 114*     ASSESSMENT/PLAN:  Physical deconditioning -  for Home health, PT and OT, for therapeutic strengthening exercises; fall precautions  HCAP - he was started on IV antibiotics then changed to oral antibiotics, Augmentin was completed 07/29/16 and continue Duoneb PRN; resolved  Anemia of chronic disease - hgb 9.7, improved from 8.8, iron level 18 ; continue FeSO4 325 mg 1 tab PO BID  Chronic renal failure, stage 3 - creatinine 1.54, improved; was recently given IV fluids, encourage oral fluids   Persistent atrial fibrillation -  Rate-controlled; continue carvedilol 12.5 mg 1 tab by mouth twice a day and Coumadin  Hypertension - well-controlled; continue Bidil 1 tab PO BID, amlodipine 10 mg 1 tab by mouth daily and carvedilol 12.5 mg 1 tab by mouth twice a day  Pressure ulcer on coccyx, stage 2 - continue wound treatment daily and decubivite 1 tab PO daily  Constipation - continue MiraLAX  17 g by mouth daily when necessary, senna S 2 tabs by mouth twice a day and Colace 100 mg 1 cap PO PRN  Glaucoma - no complaints of eye pain; Combigan 0.2% -0.5% 1 drop into both eyes every 12 hours, Xalatan 0.005% instill 1 drop each eye daily at bedtime  Overactive bladder - continue Myrbetriq ER 25 mg 1 tab by mouth daily  Allergic rhinitis - continue fluticasone 50 g 2 sprays into both nostrils daily at bedtime  GERD - continue ranitidine 150 mg 1 tab by mouth daily at bedtime    Depression - mood is stable; continue Remeron 15 mg 1 tab by mouth daily at bedtime and Cymbalta 30 mg 1 capsule by mouth daily   BPH - continue Flomax 0.4 mg 1 capsule by mouth daily       I have filled out patient's discharge paperwork and written prescriptions.  Patient will receive home health PT, OT, Nursing and CNA.  DME provided:  None  Total discharge time: Greater than 30 minutes Greater than 50% was spent in counseling and coordination of care with the patient.   Discharge time involved coordination of the discharge process with social worker, nursing staff and therapy department. Medical justification for home health services verified.    Edy Mcbane C. [place] - NP [employer] [phone]

## 2016-09-11 DIAGNOSIS — I4891 Unspecified atrial fibrillation: Secondary | ICD-10-CM | POA: Diagnosis not present

## 2016-09-11 DIAGNOSIS — M24569 Contracture, unspecified knee: Secondary | ICD-10-CM | POA: Diagnosis not present

## 2016-09-11 DIAGNOSIS — Z7901 Long term (current) use of anticoagulants: Secondary | ICD-10-CM | POA: Diagnosis not present

## 2016-09-11 DIAGNOSIS — R634 Abnormal weight loss: Secondary | ICD-10-CM | POA: Diagnosis not present

## 2016-09-11 DIAGNOSIS — R531 Weakness: Secondary | ICD-10-CM | POA: Diagnosis not present

## 2016-09-12 DIAGNOSIS — I129 Hypertensive chronic kidney disease with stage 1 through stage 4 chronic kidney disease, or unspecified chronic kidney disease: Secondary | ICD-10-CM | POA: Diagnosis not present

## 2016-09-12 DIAGNOSIS — I480 Paroxysmal atrial fibrillation: Secondary | ICD-10-CM | POA: Diagnosis not present

## 2016-09-12 DIAGNOSIS — F0280 Dementia in other diseases classified elsewhere without behavioral disturbance: Secondary | ICD-10-CM | POA: Diagnosis not present

## 2016-09-12 DIAGNOSIS — Z8701 Personal history of pneumonia (recurrent): Secondary | ICD-10-CM | POA: Diagnosis not present

## 2016-09-12 DIAGNOSIS — S72141D Displaced intertrochanteric fracture of right femur, subsequent encounter for closed fracture with routine healing: Secondary | ICD-10-CM | POA: Diagnosis not present

## 2016-09-12 DIAGNOSIS — G309 Alzheimer's disease, unspecified: Secondary | ICD-10-CM | POA: Diagnosis not present

## 2016-09-13 DIAGNOSIS — F0280 Dementia in other diseases classified elsewhere without behavioral disturbance: Secondary | ICD-10-CM | POA: Diagnosis not present

## 2016-09-13 DIAGNOSIS — Z8701 Personal history of pneumonia (recurrent): Secondary | ICD-10-CM | POA: Diagnosis not present

## 2016-09-13 DIAGNOSIS — G309 Alzheimer's disease, unspecified: Secondary | ICD-10-CM | POA: Diagnosis not present

## 2016-09-13 DIAGNOSIS — I480 Paroxysmal atrial fibrillation: Secondary | ICD-10-CM | POA: Diagnosis not present

## 2016-09-13 DIAGNOSIS — I129 Hypertensive chronic kidney disease with stage 1 through stage 4 chronic kidney disease, or unspecified chronic kidney disease: Secondary | ICD-10-CM | POA: Diagnosis not present

## 2016-09-13 DIAGNOSIS — S72141D Displaced intertrochanteric fracture of right femur, subsequent encounter for closed fracture with routine healing: Secondary | ICD-10-CM | POA: Diagnosis not present

## 2016-09-14 DIAGNOSIS — I129 Hypertensive chronic kidney disease with stage 1 through stage 4 chronic kidney disease, or unspecified chronic kidney disease: Secondary | ICD-10-CM | POA: Diagnosis not present

## 2016-09-14 DIAGNOSIS — S72141D Displaced intertrochanteric fracture of right femur, subsequent encounter for closed fracture with routine healing: Secondary | ICD-10-CM | POA: Diagnosis not present

## 2016-09-14 DIAGNOSIS — G309 Alzheimer's disease, unspecified: Secondary | ICD-10-CM | POA: Diagnosis not present

## 2016-09-14 DIAGNOSIS — Z8701 Personal history of pneumonia (recurrent): Secondary | ICD-10-CM | POA: Diagnosis not present

## 2016-09-14 DIAGNOSIS — F0280 Dementia in other diseases classified elsewhere without behavioral disturbance: Secondary | ICD-10-CM | POA: Diagnosis not present

## 2016-09-14 DIAGNOSIS — I480 Paroxysmal atrial fibrillation: Secondary | ICD-10-CM | POA: Diagnosis not present

## 2016-09-15 ENCOUNTER — Other Ambulatory Visit: Payer: Self-pay | Admitting: Family Medicine

## 2016-09-15 DIAGNOSIS — S72141D Displaced intertrochanteric fracture of right femur, subsequent encounter for closed fracture with routine healing: Secondary | ICD-10-CM | POA: Diagnosis not present

## 2016-09-15 DIAGNOSIS — I129 Hypertensive chronic kidney disease with stage 1 through stage 4 chronic kidney disease, or unspecified chronic kidney disease: Secondary | ICD-10-CM | POA: Diagnosis not present

## 2016-09-15 DIAGNOSIS — G309 Alzheimer's disease, unspecified: Secondary | ICD-10-CM | POA: Diagnosis not present

## 2016-09-15 DIAGNOSIS — F0280 Dementia in other diseases classified elsewhere without behavioral disturbance: Secondary | ICD-10-CM | POA: Diagnosis not present

## 2016-09-15 DIAGNOSIS — I480 Paroxysmal atrial fibrillation: Secondary | ICD-10-CM | POA: Diagnosis not present

## 2016-09-15 DIAGNOSIS — R634 Abnormal weight loss: Secondary | ICD-10-CM

## 2016-09-15 DIAGNOSIS — Z8701 Personal history of pneumonia (recurrent): Secondary | ICD-10-CM | POA: Diagnosis not present

## 2016-09-18 DIAGNOSIS — Z8701 Personal history of pneumonia (recurrent): Secondary | ICD-10-CM | POA: Diagnosis not present

## 2016-09-18 DIAGNOSIS — I480 Paroxysmal atrial fibrillation: Secondary | ICD-10-CM | POA: Diagnosis not present

## 2016-09-18 DIAGNOSIS — F0280 Dementia in other diseases classified elsewhere without behavioral disturbance: Secondary | ICD-10-CM | POA: Diagnosis not present

## 2016-09-18 DIAGNOSIS — I129 Hypertensive chronic kidney disease with stage 1 through stage 4 chronic kidney disease, or unspecified chronic kidney disease: Secondary | ICD-10-CM | POA: Diagnosis not present

## 2016-09-18 DIAGNOSIS — G309 Alzheimer's disease, unspecified: Secondary | ICD-10-CM | POA: Diagnosis not present

## 2016-09-18 DIAGNOSIS — S72141D Displaced intertrochanteric fracture of right femur, subsequent encounter for closed fracture with routine healing: Secondary | ICD-10-CM | POA: Diagnosis not present

## 2016-09-19 DIAGNOSIS — Z8701 Personal history of pneumonia (recurrent): Secondary | ICD-10-CM | POA: Diagnosis not present

## 2016-09-19 DIAGNOSIS — F0280 Dementia in other diseases classified elsewhere without behavioral disturbance: Secondary | ICD-10-CM | POA: Diagnosis not present

## 2016-09-19 DIAGNOSIS — I480 Paroxysmal atrial fibrillation: Secondary | ICD-10-CM | POA: Diagnosis not present

## 2016-09-19 DIAGNOSIS — I129 Hypertensive chronic kidney disease with stage 1 through stage 4 chronic kidney disease, or unspecified chronic kidney disease: Secondary | ICD-10-CM | POA: Diagnosis not present

## 2016-09-19 DIAGNOSIS — G309 Alzheimer's disease, unspecified: Secondary | ICD-10-CM | POA: Diagnosis not present

## 2016-09-19 DIAGNOSIS — S72141D Displaced intertrochanteric fracture of right femur, subsequent encounter for closed fracture with routine healing: Secondary | ICD-10-CM | POA: Diagnosis not present

## 2016-09-20 DIAGNOSIS — I480 Paroxysmal atrial fibrillation: Secondary | ICD-10-CM | POA: Diagnosis not present

## 2016-09-20 DIAGNOSIS — G309 Alzheimer's disease, unspecified: Secondary | ICD-10-CM | POA: Diagnosis not present

## 2016-09-20 DIAGNOSIS — S72141D Displaced intertrochanteric fracture of right femur, subsequent encounter for closed fracture with routine healing: Secondary | ICD-10-CM | POA: Diagnosis not present

## 2016-09-20 DIAGNOSIS — Z8701 Personal history of pneumonia (recurrent): Secondary | ICD-10-CM | POA: Diagnosis not present

## 2016-09-20 DIAGNOSIS — F0280 Dementia in other diseases classified elsewhere without behavioral disturbance: Secondary | ICD-10-CM | POA: Diagnosis not present

## 2016-09-20 DIAGNOSIS — I129 Hypertensive chronic kidney disease with stage 1 through stage 4 chronic kidney disease, or unspecified chronic kidney disease: Secondary | ICD-10-CM | POA: Diagnosis not present

## 2016-09-21 ENCOUNTER — Encounter (INDEPENDENT_AMBULATORY_CARE_PROVIDER_SITE_OTHER): Payer: Self-pay | Admitting: Orthopaedic Surgery

## 2016-09-21 ENCOUNTER — Ambulatory Visit (INDEPENDENT_AMBULATORY_CARE_PROVIDER_SITE_OTHER): Payer: Medicare Other

## 2016-09-21 ENCOUNTER — Ambulatory Visit (INDEPENDENT_AMBULATORY_CARE_PROVIDER_SITE_OTHER): Payer: Medicare Other | Admitting: Orthopaedic Surgery

## 2016-09-21 DIAGNOSIS — Z09 Encounter for follow-up examination after completed treatment for conditions other than malignant neoplasm: Secondary | ICD-10-CM

## 2016-09-21 DIAGNOSIS — H43813 Vitreous degeneration, bilateral: Secondary | ICD-10-CM | POA: Diagnosis not present

## 2016-09-21 DIAGNOSIS — I129 Hypertensive chronic kidney disease with stage 1 through stage 4 chronic kidney disease, or unspecified chronic kidney disease: Secondary | ICD-10-CM | POA: Diagnosis not present

## 2016-09-21 DIAGNOSIS — Z8701 Personal history of pneumonia (recurrent): Secondary | ICD-10-CM | POA: Diagnosis not present

## 2016-09-21 DIAGNOSIS — F0280 Dementia in other diseases classified elsewhere without behavioral disturbance: Secondary | ICD-10-CM | POA: Diagnosis not present

## 2016-09-21 DIAGNOSIS — G309 Alzheimer's disease, unspecified: Secondary | ICD-10-CM | POA: Diagnosis not present

## 2016-09-21 DIAGNOSIS — H34832 Tributary (branch) retinal vein occlusion, left eye, with macular edema: Secondary | ICD-10-CM | POA: Diagnosis not present

## 2016-09-21 DIAGNOSIS — S72141D Displaced intertrochanteric fracture of right femur, subsequent encounter for closed fracture with routine healing: Secondary | ICD-10-CM | POA: Diagnosis not present

## 2016-09-21 DIAGNOSIS — S72001A Fracture of unspecified part of neck of right femur, initial encounter for closed fracture: Secondary | ICD-10-CM

## 2016-09-21 DIAGNOSIS — I480 Paroxysmal atrial fibrillation: Secondary | ICD-10-CM | POA: Diagnosis not present

## 2016-09-21 NOTE — Progress Notes (Signed)
The patient is a 83 year old who is getting close to 4 months out from open reduction and fixation of the intertrochanteric hip fracture. He is a part-time mobility. Postoperatively in the skilled nursing facility had a bedsore needed to get this to heal. He's gone downhill in terms of a lot of weight loss and immobility. He's had a previous left hip hemiarthroplasty.  On examination his left hip is doing well the right hip has pain of the trochanteric area at the prominence of the hardware as well as pain with rotation of the hip. X-rays show no evidence of hardware failure. There is definitely be collapse of the fracture. There is been interval healing but he'll also be maybe a small area this not healed yet.  At this point we'll send her to physical therapy at his new facility to work on balance, gait training, coordination and bilateral lower extremity strengthening. I'll see him back in 6 weeks to see how is doing overall.

## 2016-09-22 DIAGNOSIS — Z8701 Personal history of pneumonia (recurrent): Secondary | ICD-10-CM | POA: Diagnosis not present

## 2016-09-22 DIAGNOSIS — I129 Hypertensive chronic kidney disease with stage 1 through stage 4 chronic kidney disease, or unspecified chronic kidney disease: Secondary | ICD-10-CM | POA: Diagnosis not present

## 2016-09-22 DIAGNOSIS — I480 Paroxysmal atrial fibrillation: Secondary | ICD-10-CM | POA: Diagnosis not present

## 2016-09-22 DIAGNOSIS — S72141D Displaced intertrochanteric fracture of right femur, subsequent encounter for closed fracture with routine healing: Secondary | ICD-10-CM | POA: Diagnosis not present

## 2016-09-22 DIAGNOSIS — F0280 Dementia in other diseases classified elsewhere without behavioral disturbance: Secondary | ICD-10-CM | POA: Diagnosis not present

## 2016-09-22 DIAGNOSIS — G309 Alzheimer's disease, unspecified: Secondary | ICD-10-CM | POA: Diagnosis not present

## 2016-09-25 DIAGNOSIS — F0280 Dementia in other diseases classified elsewhere without behavioral disturbance: Secondary | ICD-10-CM | POA: Diagnosis not present

## 2016-09-25 DIAGNOSIS — I129 Hypertensive chronic kidney disease with stage 1 through stage 4 chronic kidney disease, or unspecified chronic kidney disease: Secondary | ICD-10-CM | POA: Diagnosis not present

## 2016-09-25 DIAGNOSIS — G309 Alzheimer's disease, unspecified: Secondary | ICD-10-CM | POA: Diagnosis not present

## 2016-09-25 DIAGNOSIS — S72141D Displaced intertrochanteric fracture of right femur, subsequent encounter for closed fracture with routine healing: Secondary | ICD-10-CM | POA: Diagnosis not present

## 2016-09-25 DIAGNOSIS — I480 Paroxysmal atrial fibrillation: Secondary | ICD-10-CM | POA: Diagnosis not present

## 2016-09-25 DIAGNOSIS — I4891 Unspecified atrial fibrillation: Secondary | ICD-10-CM | POA: Diagnosis not present

## 2016-09-25 DIAGNOSIS — Z7901 Long term (current) use of anticoagulants: Secondary | ICD-10-CM | POA: Diagnosis not present

## 2016-09-25 DIAGNOSIS — F0390 Unspecified dementia without behavioral disturbance: Secondary | ICD-10-CM | POA: Diagnosis not present

## 2016-09-25 DIAGNOSIS — Z8701 Personal history of pneumonia (recurrent): Secondary | ICD-10-CM | POA: Diagnosis not present

## 2016-09-27 DIAGNOSIS — I480 Paroxysmal atrial fibrillation: Secondary | ICD-10-CM | POA: Diagnosis not present

## 2016-09-27 DIAGNOSIS — G309 Alzheimer's disease, unspecified: Secondary | ICD-10-CM | POA: Diagnosis not present

## 2016-09-27 DIAGNOSIS — F0280 Dementia in other diseases classified elsewhere without behavioral disturbance: Secondary | ICD-10-CM | POA: Diagnosis not present

## 2016-09-27 DIAGNOSIS — Z8701 Personal history of pneumonia (recurrent): Secondary | ICD-10-CM | POA: Diagnosis not present

## 2016-09-27 DIAGNOSIS — I129 Hypertensive chronic kidney disease with stage 1 through stage 4 chronic kidney disease, or unspecified chronic kidney disease: Secondary | ICD-10-CM | POA: Diagnosis not present

## 2016-09-27 DIAGNOSIS — S72141D Displaced intertrochanteric fracture of right femur, subsequent encounter for closed fracture with routine healing: Secondary | ICD-10-CM | POA: Diagnosis not present

## 2016-09-28 DIAGNOSIS — G309 Alzheimer's disease, unspecified: Secondary | ICD-10-CM | POA: Diagnosis not present

## 2016-09-28 DIAGNOSIS — F0280 Dementia in other diseases classified elsewhere without behavioral disturbance: Secondary | ICD-10-CM | POA: Diagnosis not present

## 2016-09-28 DIAGNOSIS — Z8701 Personal history of pneumonia (recurrent): Secondary | ICD-10-CM | POA: Diagnosis not present

## 2016-09-28 DIAGNOSIS — S72141D Displaced intertrochanteric fracture of right femur, subsequent encounter for closed fracture with routine healing: Secondary | ICD-10-CM | POA: Diagnosis not present

## 2016-09-28 DIAGNOSIS — I129 Hypertensive chronic kidney disease with stage 1 through stage 4 chronic kidney disease, or unspecified chronic kidney disease: Secondary | ICD-10-CM | POA: Diagnosis not present

## 2016-09-28 DIAGNOSIS — I480 Paroxysmal atrial fibrillation: Secondary | ICD-10-CM | POA: Diagnosis not present

## 2016-09-29 DIAGNOSIS — I480 Paroxysmal atrial fibrillation: Secondary | ICD-10-CM | POA: Diagnosis not present

## 2016-09-29 DIAGNOSIS — I129 Hypertensive chronic kidney disease with stage 1 through stage 4 chronic kidney disease, or unspecified chronic kidney disease: Secondary | ICD-10-CM | POA: Diagnosis not present

## 2016-09-29 DIAGNOSIS — Z8701 Personal history of pneumonia (recurrent): Secondary | ICD-10-CM | POA: Diagnosis not present

## 2016-09-29 DIAGNOSIS — G309 Alzheimer's disease, unspecified: Secondary | ICD-10-CM | POA: Diagnosis not present

## 2016-09-29 DIAGNOSIS — F0280 Dementia in other diseases classified elsewhere without behavioral disturbance: Secondary | ICD-10-CM | POA: Diagnosis not present

## 2016-09-29 DIAGNOSIS — S72141D Displaced intertrochanteric fracture of right femur, subsequent encounter for closed fracture with routine healing: Secondary | ICD-10-CM | POA: Diagnosis not present

## 2016-10-02 DIAGNOSIS — F0280 Dementia in other diseases classified elsewhere without behavioral disturbance: Secondary | ICD-10-CM | POA: Diagnosis not present

## 2016-10-02 DIAGNOSIS — I480 Paroxysmal atrial fibrillation: Secondary | ICD-10-CM | POA: Diagnosis not present

## 2016-10-02 DIAGNOSIS — G309 Alzheimer's disease, unspecified: Secondary | ICD-10-CM | POA: Diagnosis not present

## 2016-10-02 DIAGNOSIS — I129 Hypertensive chronic kidney disease with stage 1 through stage 4 chronic kidney disease, or unspecified chronic kidney disease: Secondary | ICD-10-CM | POA: Diagnosis not present

## 2016-10-02 DIAGNOSIS — Z8701 Personal history of pneumonia (recurrent): Secondary | ICD-10-CM | POA: Diagnosis not present

## 2016-10-02 DIAGNOSIS — S72141D Displaced intertrochanteric fracture of right femur, subsequent encounter for closed fracture with routine healing: Secondary | ICD-10-CM | POA: Diagnosis not present

## 2016-10-03 DIAGNOSIS — F0280 Dementia in other diseases classified elsewhere without behavioral disturbance: Secondary | ICD-10-CM | POA: Diagnosis not present

## 2016-10-03 DIAGNOSIS — G309 Alzheimer's disease, unspecified: Secondary | ICD-10-CM | POA: Diagnosis not present

## 2016-10-03 DIAGNOSIS — I129 Hypertensive chronic kidney disease with stage 1 through stage 4 chronic kidney disease, or unspecified chronic kidney disease: Secondary | ICD-10-CM | POA: Diagnosis not present

## 2016-10-03 DIAGNOSIS — Z8701 Personal history of pneumonia (recurrent): Secondary | ICD-10-CM | POA: Diagnosis not present

## 2016-10-03 DIAGNOSIS — S72141D Displaced intertrochanteric fracture of right femur, subsequent encounter for closed fracture with routine healing: Secondary | ICD-10-CM | POA: Diagnosis not present

## 2016-10-03 DIAGNOSIS — I480 Paroxysmal atrial fibrillation: Secondary | ICD-10-CM | POA: Diagnosis not present

## 2016-10-04 DIAGNOSIS — G309 Alzheimer's disease, unspecified: Secondary | ICD-10-CM | POA: Diagnosis not present

## 2016-10-04 DIAGNOSIS — F0280 Dementia in other diseases classified elsewhere without behavioral disturbance: Secondary | ICD-10-CM | POA: Diagnosis not present

## 2016-10-04 DIAGNOSIS — I480 Paroxysmal atrial fibrillation: Secondary | ICD-10-CM | POA: Diagnosis not present

## 2016-10-04 DIAGNOSIS — S72141D Displaced intertrochanteric fracture of right femur, subsequent encounter for closed fracture with routine healing: Secondary | ICD-10-CM | POA: Diagnosis not present

## 2016-10-04 DIAGNOSIS — Z8701 Personal history of pneumonia (recurrent): Secondary | ICD-10-CM | POA: Diagnosis not present

## 2016-10-04 DIAGNOSIS — I129 Hypertensive chronic kidney disease with stage 1 through stage 4 chronic kidney disease, or unspecified chronic kidney disease: Secondary | ICD-10-CM | POA: Diagnosis not present

## 2016-10-05 DIAGNOSIS — Z8701 Personal history of pneumonia (recurrent): Secondary | ICD-10-CM | POA: Diagnosis not present

## 2016-10-05 DIAGNOSIS — G309 Alzheimer's disease, unspecified: Secondary | ICD-10-CM | POA: Diagnosis not present

## 2016-10-05 DIAGNOSIS — F0280 Dementia in other diseases classified elsewhere without behavioral disturbance: Secondary | ICD-10-CM | POA: Diagnosis not present

## 2016-10-05 DIAGNOSIS — I480 Paroxysmal atrial fibrillation: Secondary | ICD-10-CM | POA: Diagnosis not present

## 2016-10-05 DIAGNOSIS — S72141D Displaced intertrochanteric fracture of right femur, subsequent encounter for closed fracture with routine healing: Secondary | ICD-10-CM | POA: Diagnosis not present

## 2016-10-05 DIAGNOSIS — I129 Hypertensive chronic kidney disease with stage 1 through stage 4 chronic kidney disease, or unspecified chronic kidney disease: Secondary | ICD-10-CM | POA: Diagnosis not present

## 2016-10-06 DIAGNOSIS — F0280 Dementia in other diseases classified elsewhere without behavioral disturbance: Secondary | ICD-10-CM | POA: Diagnosis not present

## 2016-10-06 DIAGNOSIS — I480 Paroxysmal atrial fibrillation: Secondary | ICD-10-CM | POA: Diagnosis not present

## 2016-10-06 DIAGNOSIS — G309 Alzheimer's disease, unspecified: Secondary | ICD-10-CM | POA: Diagnosis not present

## 2016-10-06 DIAGNOSIS — I129 Hypertensive chronic kidney disease with stage 1 through stage 4 chronic kidney disease, or unspecified chronic kidney disease: Secondary | ICD-10-CM | POA: Diagnosis not present

## 2016-10-06 DIAGNOSIS — S72141D Displaced intertrochanteric fracture of right femur, subsequent encounter for closed fracture with routine healing: Secondary | ICD-10-CM | POA: Diagnosis not present

## 2016-10-06 DIAGNOSIS — Z8701 Personal history of pneumonia (recurrent): Secondary | ICD-10-CM | POA: Diagnosis not present

## 2016-10-09 DIAGNOSIS — Z8701 Personal history of pneumonia (recurrent): Secondary | ICD-10-CM | POA: Diagnosis not present

## 2016-10-09 DIAGNOSIS — I129 Hypertensive chronic kidney disease with stage 1 through stage 4 chronic kidney disease, or unspecified chronic kidney disease: Secondary | ICD-10-CM | POA: Diagnosis not present

## 2016-10-09 DIAGNOSIS — I480 Paroxysmal atrial fibrillation: Secondary | ICD-10-CM | POA: Diagnosis not present

## 2016-10-09 DIAGNOSIS — S72141D Displaced intertrochanteric fracture of right femur, subsequent encounter for closed fracture with routine healing: Secondary | ICD-10-CM | POA: Diagnosis not present

## 2016-10-09 DIAGNOSIS — Z7901 Long term (current) use of anticoagulants: Secondary | ICD-10-CM | POA: Diagnosis not present

## 2016-10-09 DIAGNOSIS — G309 Alzheimer's disease, unspecified: Secondary | ICD-10-CM | POA: Diagnosis not present

## 2016-10-09 DIAGNOSIS — F0280 Dementia in other diseases classified elsewhere without behavioral disturbance: Secondary | ICD-10-CM | POA: Diagnosis not present

## 2016-10-10 DIAGNOSIS — I129 Hypertensive chronic kidney disease with stage 1 through stage 4 chronic kidney disease, or unspecified chronic kidney disease: Secondary | ICD-10-CM | POA: Diagnosis not present

## 2016-10-10 DIAGNOSIS — F0280 Dementia in other diseases classified elsewhere without behavioral disturbance: Secondary | ICD-10-CM | POA: Diagnosis not present

## 2016-10-10 DIAGNOSIS — Z8701 Personal history of pneumonia (recurrent): Secondary | ICD-10-CM | POA: Diagnosis not present

## 2016-10-10 DIAGNOSIS — G309 Alzheimer's disease, unspecified: Secondary | ICD-10-CM | POA: Diagnosis not present

## 2016-10-10 DIAGNOSIS — S72141D Displaced intertrochanteric fracture of right femur, subsequent encounter for closed fracture with routine healing: Secondary | ICD-10-CM | POA: Diagnosis not present

## 2016-10-10 DIAGNOSIS — I480 Paroxysmal atrial fibrillation: Secondary | ICD-10-CM | POA: Diagnosis not present

## 2016-10-12 DIAGNOSIS — I129 Hypertensive chronic kidney disease with stage 1 through stage 4 chronic kidney disease, or unspecified chronic kidney disease: Secondary | ICD-10-CM | POA: Diagnosis not present

## 2016-10-12 DIAGNOSIS — I480 Paroxysmal atrial fibrillation: Secondary | ICD-10-CM | POA: Diagnosis not present

## 2016-10-12 DIAGNOSIS — Z8701 Personal history of pneumonia (recurrent): Secondary | ICD-10-CM | POA: Diagnosis not present

## 2016-10-12 DIAGNOSIS — S72141D Displaced intertrochanteric fracture of right femur, subsequent encounter for closed fracture with routine healing: Secondary | ICD-10-CM | POA: Diagnosis not present

## 2016-10-12 DIAGNOSIS — F0280 Dementia in other diseases classified elsewhere without behavioral disturbance: Secondary | ICD-10-CM | POA: Diagnosis not present

## 2016-10-12 DIAGNOSIS — G309 Alzheimer's disease, unspecified: Secondary | ICD-10-CM | POA: Diagnosis not present

## 2016-10-13 DIAGNOSIS — Z8701 Personal history of pneumonia (recurrent): Secondary | ICD-10-CM | POA: Diagnosis not present

## 2016-10-13 DIAGNOSIS — I129 Hypertensive chronic kidney disease with stage 1 through stage 4 chronic kidney disease, or unspecified chronic kidney disease: Secondary | ICD-10-CM | POA: Diagnosis not present

## 2016-10-13 DIAGNOSIS — S72141D Displaced intertrochanteric fracture of right femur, subsequent encounter for closed fracture with routine healing: Secondary | ICD-10-CM | POA: Diagnosis not present

## 2016-10-13 DIAGNOSIS — I480 Paroxysmal atrial fibrillation: Secondary | ICD-10-CM | POA: Diagnosis not present

## 2016-10-13 DIAGNOSIS — F0280 Dementia in other diseases classified elsewhere without behavioral disturbance: Secondary | ICD-10-CM | POA: Diagnosis not present

## 2016-10-13 DIAGNOSIS — G309 Alzheimer's disease, unspecified: Secondary | ICD-10-CM | POA: Diagnosis not present

## 2016-10-16 DIAGNOSIS — Z8701 Personal history of pneumonia (recurrent): Secondary | ICD-10-CM | POA: Diagnosis not present

## 2016-10-16 DIAGNOSIS — F0280 Dementia in other diseases classified elsewhere without behavioral disturbance: Secondary | ICD-10-CM | POA: Diagnosis not present

## 2016-10-16 DIAGNOSIS — G309 Alzheimer's disease, unspecified: Secondary | ICD-10-CM | POA: Diagnosis not present

## 2016-10-16 DIAGNOSIS — S72141D Displaced intertrochanteric fracture of right femur, subsequent encounter for closed fracture with routine healing: Secondary | ICD-10-CM | POA: Diagnosis not present

## 2016-10-16 DIAGNOSIS — I129 Hypertensive chronic kidney disease with stage 1 through stage 4 chronic kidney disease, or unspecified chronic kidney disease: Secondary | ICD-10-CM | POA: Diagnosis not present

## 2016-10-16 DIAGNOSIS — I480 Paroxysmal atrial fibrillation: Secondary | ICD-10-CM | POA: Diagnosis not present

## 2016-10-17 DIAGNOSIS — I480 Paroxysmal atrial fibrillation: Secondary | ICD-10-CM | POA: Diagnosis not present

## 2016-10-17 DIAGNOSIS — Z8701 Personal history of pneumonia (recurrent): Secondary | ICD-10-CM | POA: Diagnosis not present

## 2016-10-17 DIAGNOSIS — G309 Alzheimer's disease, unspecified: Secondary | ICD-10-CM | POA: Diagnosis not present

## 2016-10-17 DIAGNOSIS — I129 Hypertensive chronic kidney disease with stage 1 through stage 4 chronic kidney disease, or unspecified chronic kidney disease: Secondary | ICD-10-CM | POA: Diagnosis not present

## 2016-10-17 DIAGNOSIS — F0280 Dementia in other diseases classified elsewhere without behavioral disturbance: Secondary | ICD-10-CM | POA: Diagnosis not present

## 2016-10-17 DIAGNOSIS — S72141D Displaced intertrochanteric fracture of right femur, subsequent encounter for closed fracture with routine healing: Secondary | ICD-10-CM | POA: Diagnosis not present

## 2016-10-18 DIAGNOSIS — G309 Alzheimer's disease, unspecified: Secondary | ICD-10-CM | POA: Diagnosis not present

## 2016-10-18 DIAGNOSIS — I480 Paroxysmal atrial fibrillation: Secondary | ICD-10-CM | POA: Diagnosis not present

## 2016-10-18 DIAGNOSIS — Z8701 Personal history of pneumonia (recurrent): Secondary | ICD-10-CM | POA: Diagnosis not present

## 2016-10-18 DIAGNOSIS — S72141D Displaced intertrochanteric fracture of right femur, subsequent encounter for closed fracture with routine healing: Secondary | ICD-10-CM | POA: Diagnosis not present

## 2016-10-18 DIAGNOSIS — I129 Hypertensive chronic kidney disease with stage 1 through stage 4 chronic kidney disease, or unspecified chronic kidney disease: Secondary | ICD-10-CM | POA: Diagnosis not present

## 2016-10-18 DIAGNOSIS — F0280 Dementia in other diseases classified elsewhere without behavioral disturbance: Secondary | ICD-10-CM | POA: Diagnosis not present

## 2016-10-19 DIAGNOSIS — Z8701 Personal history of pneumonia (recurrent): Secondary | ICD-10-CM | POA: Diagnosis not present

## 2016-10-19 DIAGNOSIS — I480 Paroxysmal atrial fibrillation: Secondary | ICD-10-CM | POA: Diagnosis not present

## 2016-10-19 DIAGNOSIS — S72141D Displaced intertrochanteric fracture of right femur, subsequent encounter for closed fracture with routine healing: Secondary | ICD-10-CM | POA: Diagnosis not present

## 2016-10-19 DIAGNOSIS — G309 Alzheimer's disease, unspecified: Secondary | ICD-10-CM | POA: Diagnosis not present

## 2016-10-19 DIAGNOSIS — I129 Hypertensive chronic kidney disease with stage 1 through stage 4 chronic kidney disease, or unspecified chronic kidney disease: Secondary | ICD-10-CM | POA: Diagnosis not present

## 2016-10-19 DIAGNOSIS — F0280 Dementia in other diseases classified elsewhere without behavioral disturbance: Secondary | ICD-10-CM | POA: Diagnosis not present

## 2016-10-23 ENCOUNTER — Other Ambulatory Visit: Payer: Medicare Other

## 2016-10-24 DIAGNOSIS — F0280 Dementia in other diseases classified elsewhere without behavioral disturbance: Secondary | ICD-10-CM | POA: Diagnosis not present

## 2016-10-24 DIAGNOSIS — I480 Paroxysmal atrial fibrillation: Secondary | ICD-10-CM | POA: Diagnosis not present

## 2016-10-24 DIAGNOSIS — Z8701 Personal history of pneumonia (recurrent): Secondary | ICD-10-CM | POA: Diagnosis not present

## 2016-10-24 DIAGNOSIS — G309 Alzheimer's disease, unspecified: Secondary | ICD-10-CM | POA: Diagnosis not present

## 2016-10-24 DIAGNOSIS — S72141D Displaced intertrochanteric fracture of right femur, subsequent encounter for closed fracture with routine healing: Secondary | ICD-10-CM | POA: Diagnosis not present

## 2016-10-24 DIAGNOSIS — I129 Hypertensive chronic kidney disease with stage 1 through stage 4 chronic kidney disease, or unspecified chronic kidney disease: Secondary | ICD-10-CM | POA: Diagnosis not present

## 2016-10-26 DIAGNOSIS — Z8701 Personal history of pneumonia (recurrent): Secondary | ICD-10-CM | POA: Diagnosis not present

## 2016-10-26 DIAGNOSIS — G309 Alzheimer's disease, unspecified: Secondary | ICD-10-CM | POA: Diagnosis not present

## 2016-10-26 DIAGNOSIS — I480 Paroxysmal atrial fibrillation: Secondary | ICD-10-CM | POA: Diagnosis not present

## 2016-10-26 DIAGNOSIS — S72141D Displaced intertrochanteric fracture of right femur, subsequent encounter for closed fracture with routine healing: Secondary | ICD-10-CM | POA: Diagnosis not present

## 2016-10-26 DIAGNOSIS — I129 Hypertensive chronic kidney disease with stage 1 through stage 4 chronic kidney disease, or unspecified chronic kidney disease: Secondary | ICD-10-CM | POA: Diagnosis not present

## 2016-10-26 DIAGNOSIS — F0280 Dementia in other diseases classified elsewhere without behavioral disturbance: Secondary | ICD-10-CM | POA: Diagnosis not present

## 2016-10-30 DIAGNOSIS — I480 Paroxysmal atrial fibrillation: Secondary | ICD-10-CM | POA: Diagnosis not present

## 2016-10-30 DIAGNOSIS — G309 Alzheimer's disease, unspecified: Secondary | ICD-10-CM | POA: Diagnosis not present

## 2016-10-30 DIAGNOSIS — S72141D Displaced intertrochanteric fracture of right femur, subsequent encounter for closed fracture with routine healing: Secondary | ICD-10-CM | POA: Diagnosis not present

## 2016-10-30 DIAGNOSIS — I129 Hypertensive chronic kidney disease with stage 1 through stage 4 chronic kidney disease, or unspecified chronic kidney disease: Secondary | ICD-10-CM | POA: Diagnosis not present

## 2016-10-30 DIAGNOSIS — Z8701 Personal history of pneumonia (recurrent): Secondary | ICD-10-CM | POA: Diagnosis not present

## 2016-10-30 DIAGNOSIS — F0280 Dementia in other diseases classified elsewhere without behavioral disturbance: Secondary | ICD-10-CM | POA: Diagnosis not present

## 2016-10-31 DIAGNOSIS — Z8701 Personal history of pneumonia (recurrent): Secondary | ICD-10-CM | POA: Diagnosis not present

## 2016-10-31 DIAGNOSIS — G309 Alzheimer's disease, unspecified: Secondary | ICD-10-CM | POA: Diagnosis not present

## 2016-10-31 DIAGNOSIS — F0280 Dementia in other diseases classified elsewhere without behavioral disturbance: Secondary | ICD-10-CM | POA: Diagnosis not present

## 2016-10-31 DIAGNOSIS — I480 Paroxysmal atrial fibrillation: Secondary | ICD-10-CM | POA: Diagnosis not present

## 2016-10-31 DIAGNOSIS — I129 Hypertensive chronic kidney disease with stage 1 through stage 4 chronic kidney disease, or unspecified chronic kidney disease: Secondary | ICD-10-CM | POA: Diagnosis not present

## 2016-10-31 DIAGNOSIS — S72141D Displaced intertrochanteric fracture of right femur, subsequent encounter for closed fracture with routine healing: Secondary | ICD-10-CM | POA: Diagnosis not present

## 2016-11-01 DIAGNOSIS — F0280 Dementia in other diseases classified elsewhere without behavioral disturbance: Secondary | ICD-10-CM | POA: Diagnosis not present

## 2016-11-01 DIAGNOSIS — G309 Alzheimer's disease, unspecified: Secondary | ICD-10-CM | POA: Diagnosis not present

## 2016-11-01 DIAGNOSIS — Z8701 Personal history of pneumonia (recurrent): Secondary | ICD-10-CM | POA: Diagnosis not present

## 2016-11-01 DIAGNOSIS — I480 Paroxysmal atrial fibrillation: Secondary | ICD-10-CM | POA: Diagnosis not present

## 2016-11-01 DIAGNOSIS — S72141D Displaced intertrochanteric fracture of right femur, subsequent encounter for closed fracture with routine healing: Secondary | ICD-10-CM | POA: Diagnosis not present

## 2016-11-01 DIAGNOSIS — I129 Hypertensive chronic kidney disease with stage 1 through stage 4 chronic kidney disease, or unspecified chronic kidney disease: Secondary | ICD-10-CM | POA: Diagnosis not present

## 2016-11-02 ENCOUNTER — Ambulatory Visit (INDEPENDENT_AMBULATORY_CARE_PROVIDER_SITE_OTHER): Payer: Medicare Other | Admitting: Orthopaedic Surgery

## 2016-11-02 DIAGNOSIS — F0280 Dementia in other diseases classified elsewhere without behavioral disturbance: Secondary | ICD-10-CM | POA: Diagnosis not present

## 2016-11-02 DIAGNOSIS — Z8701 Personal history of pneumonia (recurrent): Secondary | ICD-10-CM | POA: Diagnosis not present

## 2016-11-02 DIAGNOSIS — G309 Alzheimer's disease, unspecified: Secondary | ICD-10-CM | POA: Diagnosis not present

## 2016-11-02 DIAGNOSIS — I480 Paroxysmal atrial fibrillation: Secondary | ICD-10-CM | POA: Diagnosis not present

## 2016-11-02 DIAGNOSIS — I129 Hypertensive chronic kidney disease with stage 1 through stage 4 chronic kidney disease, or unspecified chronic kidney disease: Secondary | ICD-10-CM | POA: Diagnosis not present

## 2016-11-02 DIAGNOSIS — S72141D Displaced intertrochanteric fracture of right femur, subsequent encounter for closed fracture with routine healing: Secondary | ICD-10-CM | POA: Diagnosis not present

## 2016-11-06 ENCOUNTER — Other Ambulatory Visit: Payer: Medicare Other

## 2016-11-07 ENCOUNTER — Ambulatory Visit
Admission: RE | Admit: 2016-11-07 | Discharge: 2016-11-07 | Disposition: A | Discharge status: Home / Self Care | Payer: Medicare Other | Source: Ambulatory Visit | Attending: Family Medicine | Admitting: Family Medicine

## 2016-11-07 DIAGNOSIS — R634 Abnormal weight loss: Secondary | ICD-10-CM

## 2016-11-07 MED ORDER — IOPAMIDOL (ISOVUE-300) INJECTION 61%
80.0000 mL | Freq: Once | INTRAVENOUS | Status: AC | PRN
  Administered 2016-11-07: 80 mL via INTRAVENOUS

## 2016-11-07 MED ORDER — IOPAMIDOL (ISOVUE-300) INJECTION 61%: 80.0000 mL | Freq: Once | INTRAVENOUS | Status: DC | PRN

## 2016-11-08 ENCOUNTER — Encounter (HOSPITAL_COMMUNITY): Payer: Self-pay | Admitting: Internal Medicine

## 2016-11-08 ENCOUNTER — Emergency Department (HOSPITAL_COMMUNITY): Payer: Medicare Other

## 2016-11-08 ENCOUNTER — Inpatient Hospital Stay (HOSPITAL_COMMUNITY)
Admission: EM | Admit: 2016-11-08 | Discharge: 2016-11-13 | DRG: 871 | Disposition: A | Discharge status: Home / Self Care | Payer: Medicare Other | Attending: Internal Medicine | Admitting: Internal Medicine

## 2016-11-08 DIAGNOSIS — Z8546 Personal history of malignant neoplasm of prostate: Secondary | ICD-10-CM | POA: Diagnosis not present

## 2016-11-08 DIAGNOSIS — Z87891 Personal history of nicotine dependence: Secondary | ICD-10-CM | POA: Diagnosis not present

## 2016-11-08 DIAGNOSIS — Z8673 Personal history of transient ischemic attack (TIA), and cerebral infarction without residual deficits: Secondary | ICD-10-CM

## 2016-11-08 DIAGNOSIS — F339 Major depressive disorder, recurrent, unspecified: Secondary | ICD-10-CM | POA: Diagnosis present

## 2016-11-08 DIAGNOSIS — N183 Chronic kidney disease, stage 3 unspecified: Secondary | ICD-10-CM | POA: Diagnosis present

## 2016-11-08 DIAGNOSIS — Z7901 Long term (current) use of anticoagulants: Secondary | ICD-10-CM

## 2016-11-08 DIAGNOSIS — I130 Hypertensive heart and chronic kidney disease with heart failure and stage 1 through stage 4 chronic kidney disease, or unspecified chronic kidney disease: Secondary | ICD-10-CM | POA: Diagnosis present

## 2016-11-08 DIAGNOSIS — Y95 Nosocomial condition: Secondary | ICD-10-CM | POA: Diagnosis present

## 2016-11-08 DIAGNOSIS — I2510 Atherosclerotic heart disease of native coronary artery without angina pectoris: Secondary | ICD-10-CM | POA: Diagnosis present

## 2016-11-08 DIAGNOSIS — R0602 Shortness of breath: Secondary | ICD-10-CM | POA: Diagnosis not present

## 2016-11-08 DIAGNOSIS — G309 Alzheimer's disease, unspecified: Secondary | ICD-10-CM | POA: Diagnosis present

## 2016-11-08 DIAGNOSIS — C649 Malignant neoplasm of unspecified kidney, except renal pelvis: Secondary | ICD-10-CM | POA: Diagnosis present

## 2016-11-08 DIAGNOSIS — D509 Iron deficiency anemia, unspecified: Secondary | ICD-10-CM | POA: Diagnosis present

## 2016-11-08 DIAGNOSIS — Z993 Dependence on wheelchair: Secondary | ICD-10-CM | POA: Diagnosis not present

## 2016-11-08 DIAGNOSIS — I480 Paroxysmal atrial fibrillation: Secondary | ICD-10-CM | POA: Diagnosis not present

## 2016-11-08 DIAGNOSIS — J690 Pneumonitis due to inhalation of food and vomit: Secondary | ICD-10-CM | POA: Diagnosis present

## 2016-11-08 DIAGNOSIS — J189 Pneumonia, unspecified organism: Secondary | ICD-10-CM

## 2016-11-08 DIAGNOSIS — A419 Sepsis, unspecified organism: Principal | ICD-10-CM | POA: Diagnosis present

## 2016-11-08 DIAGNOSIS — N400 Enlarged prostate without lower urinary tract symptoms: Secondary | ICD-10-CM | POA: Diagnosis present

## 2016-11-08 DIAGNOSIS — J9601 Acute respiratory failure with hypoxia: Secondary | ICD-10-CM | POA: Diagnosis not present

## 2016-11-08 DIAGNOSIS — Z66 Do not resuscitate: Secondary | ICD-10-CM | POA: Diagnosis present

## 2016-11-08 DIAGNOSIS — R739 Hyperglycemia, unspecified: Secondary | ICD-10-CM | POA: Diagnosis not present

## 2016-11-08 DIAGNOSIS — J9600 Acute respiratory failure, unspecified whether with hypoxia or hypercapnia: Secondary | ICD-10-CM | POA: Diagnosis present

## 2016-11-08 DIAGNOSIS — R0600 Dyspnea, unspecified: Secondary | ICD-10-CM

## 2016-11-08 DIAGNOSIS — J449 Chronic obstructive pulmonary disease, unspecified: Secondary | ICD-10-CM | POA: Diagnosis present

## 2016-11-08 DIAGNOSIS — Z79899 Other long term (current) drug therapy: Secondary | ICD-10-CM | POA: Diagnosis not present

## 2016-11-08 DIAGNOSIS — F0280 Dementia in other diseases classified elsewhere without behavioral disturbance: Secondary | ICD-10-CM | POA: Diagnosis not present

## 2016-11-08 DIAGNOSIS — N2889 Other specified disorders of kidney and ureter: Secondary | ICD-10-CM

## 2016-11-08 DIAGNOSIS — I129 Hypertensive chronic kidney disease with stage 1 through stage 4 chronic kidney disease, or unspecified chronic kidney disease: Secondary | ICD-10-CM | POA: Diagnosis not present

## 2016-11-08 DIAGNOSIS — Z8701 Personal history of pneumonia (recurrent): Secondary | ICD-10-CM | POA: Diagnosis not present

## 2016-11-08 DIAGNOSIS — R0902 Hypoxemia: Secondary | ICD-10-CM | POA: Diagnosis not present

## 2016-11-08 DIAGNOSIS — I482 Chronic atrial fibrillation: Secondary | ICD-10-CM | POA: Diagnosis not present

## 2016-11-08 DIAGNOSIS — G301 Alzheimer's disease with late onset: Secondary | ICD-10-CM | POA: Diagnosis not present

## 2016-11-08 DIAGNOSIS — S72141D Displaced intertrochanteric fracture of right femur, subsequent encounter for closed fracture with routine healing: Secondary | ICD-10-CM | POA: Diagnosis not present

## 2016-11-08 DIAGNOSIS — I4891 Unspecified atrial fibrillation: Secondary | ICD-10-CM | POA: Diagnosis present

## 2016-11-08 DIAGNOSIS — F0390 Unspecified dementia without behavioral disturbance: Secondary | ICD-10-CM | POA: Diagnosis present

## 2016-11-08 HISTORY — DX: Essential (primary) hypertension: I10

## 2016-11-08 LAB — COMPREHENSIVE METABOLIC PANEL
ALBUMIN: 3.5 g/dL (ref 3.5–5.0)
ALK PHOS: 100 U/L (ref 38–126)
ALT: 14 U/L — ABNORMAL LOW (ref 17–63)
ANION GAP: 10 (ref 5–15)
AST: 17 U/L (ref 15–41)
BILIRUBIN TOTAL: 0.5 mg/dL (ref 0.3–1.2)
BUN: 33 mg/dL — AB (ref 6–20)
CO2: 22 mmol/L (ref 22–32)
Calcium: 9.2 mg/dL (ref 8.9–10.3)
Chloride: 105 mmol/L (ref 101–111)
Creatinine, Ser: 1.63 mg/dL — ABNORMAL HIGH (ref 0.61–1.24)
GFR calc Af Amer: 39 mL/min — ABNORMAL LOW (ref >60)
GFR calc non Af Amer: 34 mL/min — ABNORMAL LOW (ref >60)
GLUCOSE: 120 mg/dL — AB (ref 65–99)
POTASSIUM: 4.6 mmol/L (ref 3.5–5.1)
SODIUM: 137 mmol/L (ref 135–145)
TOTAL PROTEIN: 6.2 g/dL — AB (ref 6.5–8.1)

## 2016-11-08 LAB — CBC WITH DIFFERENTIAL/PLATELET
Basophils Absolute: 0 10*3/uL (ref 0.0–0.1)
Basophils Relative: 0 %
EOS PCT: 2 %
Eosinophils Absolute: 0.2 10*3/uL (ref 0.0–0.7)
HEMATOCRIT: 30.5 % — AB (ref 39.0–52.0)
Hemoglobin: 9.5 g/dL — ABNORMAL LOW (ref 13.0–17.0)
Lymphocytes Relative: 3 %
Lymphs Abs: 0.4 10*3/uL — ABNORMAL LOW (ref 0.7–4.0)
MCH: 29.3 pg (ref 26.0–34.0)
MCHC: 31.1 g/dL (ref 30.0–36.0)
MCV: 94.1 fL (ref 78.0–100.0)
MONO ABS: 0.7 10*3/uL (ref 0.1–1.0)
MONOS PCT: 6 %
Neutro Abs: 11.4 10*3/uL — ABNORMAL HIGH (ref 1.7–7.7)
Neutrophils Relative %: 89 %
PLATELETS: 262 10*3/uL (ref 150–400)
RBC: 3.24 MIL/uL — ABNORMAL LOW (ref 4.22–5.81)
RDW: 14.7 % (ref 11.5–15.5)
WBC: 12.8 10*3/uL — ABNORMAL HIGH (ref 4.0–10.5)

## 2016-11-08 LAB — BRAIN NATRIURETIC PEPTIDE: B Natriuretic Peptide: 265.4 pg/mL — ABNORMAL HIGH (ref 0.0–100.0)

## 2016-11-08 LAB — PROTIME-INR
INR: 7.61 — AB
Prothrombin Time: 66.9 seconds — ABNORMAL HIGH (ref 11.4–15.2)

## 2016-11-08 LAB — I-STAT CG4 LACTIC ACID, ED: Lactic Acid, Venous: 1.43 mmol/L (ref 0.5–1.9)

## 2016-11-08 LAB — I-STAT TROPONIN, ED: Troponin i, poc: 0 ng/mL (ref 0.00–0.08)

## 2016-11-08 MED ORDER — DEXTROSE 5 % IV SOLN
1.0000 g | Freq: Once | INTRAVENOUS | Status: AC
  Administered 2016-11-08: 1 g via INTRAVENOUS
  Filled 2016-11-08: qty 1

## 2016-11-08 MED ORDER — LATANOPROST 0.005 % OP SOLN
1.0000 [drp] | Freq: Every day | OPHTHALMIC | Status: DC
  Administered 2016-11-08 – 2016-11-12 (×5): 1 [drp] via OPHTHALMIC
  Filled 2016-11-08 (×2): qty 2.5

## 2016-11-08 MED ORDER — BRIMONIDINE TARTRATE 0.2 % OP SOLN
1.0000 [drp] | Freq: Two times a day (BID) | OPHTHALMIC | Status: DC
  Administered 2016-11-08 – 2016-11-13 (×10): 1 [drp] via OPHTHALMIC
  Filled 2016-11-08: qty 5

## 2016-11-08 MED ORDER — IPRATROPIUM-ALBUTEROL 0.5-2.5 (3) MG/3ML IN SOLN
3.0000 mL | Freq: Four times a day (QID) | RESPIRATORY_TRACT | Status: DC
  Administered 2016-11-09 – 2016-11-10 (×5): 3 mL via RESPIRATORY_TRACT
  Filled 2016-11-08 (×6): qty 3

## 2016-11-08 MED ORDER — PHYTONADIONE 5 MG PO TABS
2.5000 mg | ORAL_TABLET | Freq: Once | ORAL | Status: DC
  Filled 2016-11-08: qty 1

## 2016-11-08 MED ORDER — DULOXETINE HCL 30 MG PO CPEP
30.0000 mg | ORAL_CAPSULE | Freq: Every day | ORAL | Status: DC
  Administered 2016-11-09 – 2016-11-13 (×5): 30 mg via ORAL
  Filled 2016-11-08 (×5): qty 1

## 2016-11-08 MED ORDER — MIRTAZAPINE 15 MG PO TABS
15.0000 mg | ORAL_TABLET | Freq: Every day | ORAL | Status: DC
  Administered 2016-11-08 – 2016-11-12 (×5): 15 mg via ORAL
  Filled 2016-11-08 (×6): qty 1

## 2016-11-08 MED ORDER — ALBUTEROL SULFATE (2.5 MG/3ML) 0.083% IN NEBU
2.5000 mg | INHALATION_SOLUTION | RESPIRATORY_TRACT | Status: DC | PRN
  Administered 2016-11-09 – 2016-11-13 (×2): 2.5 mg via RESPIRATORY_TRACT
  Filled 2016-11-08 (×2): qty 3

## 2016-11-08 MED ORDER — ZINC OXIDE 11.3 % EX CREA
1.0000 "application " | TOPICAL_CREAM | Freq: Every day | CUTANEOUS | Status: DC
  Administered 2016-11-09 – 2016-11-13 (×5): 1 via TOPICAL
  Filled 2016-11-08: qty 56

## 2016-11-08 MED ORDER — CARVEDILOL 12.5 MG PO TABS
12.5000 mg | ORAL_TABLET | Freq: Every day | ORAL | Status: DC
  Administered 2016-11-09 – 2016-11-13 (×5): 12.5 mg via ORAL
  Filled 2016-11-08 (×5): qty 1

## 2016-11-08 MED ORDER — SODIUM CHLORIDE 0.9 % IV SOLN
INTRAVENOUS | Status: DC
  Administered 2016-11-08 – 2016-11-10 (×2): via INTRAVENOUS

## 2016-11-08 MED ORDER — TIMOLOL MALEATE 0.5 % OP SOLN
1.0000 [drp] | Freq: Two times a day (BID) | OPHTHALMIC | Status: DC
  Administered 2016-11-08 – 2016-11-13 (×10): 1 [drp] via OPHTHALMIC
  Filled 2016-11-08: qty 5

## 2016-11-08 MED ORDER — IPRATROPIUM-ALBUTEROL 0.5-2.5 (3) MG/3ML IN SOLN
3.0000 mL | Freq: Once | RESPIRATORY_TRACT | Status: AC
  Administered 2016-11-08: 3 mL via RESPIRATORY_TRACT
  Filled 2016-11-08: qty 3

## 2016-11-08 MED ORDER — VANCOMYCIN HCL 10 G IV SOLR
1250.0000 mg | Freq: Once | INTRAVENOUS | Status: AC
  Administered 2016-11-08: 1250 mg via INTRAVENOUS
  Filled 2016-11-08: qty 1250

## 2016-11-08 MED ORDER — DEXTROSE 5 % IV SOLN
1.0000 g | Freq: Every day | INTRAVENOUS | Status: DC
  Administered 2016-11-09: 1 g via INTRAVENOUS
  Filled 2016-11-08 (×2): qty 1

## 2016-11-08 MED ORDER — POLYETHYLENE GLYCOL 3350 17 G PO PACK
17.0000 g | PACK | Freq: Every day | ORAL | Status: DC | PRN
  Administered 2016-11-12: 17 g via ORAL
  Filled 2016-11-08: qty 1

## 2016-11-08 MED ORDER — FAMOTIDINE 20 MG PO TABS
20.0000 mg | ORAL_TABLET | Freq: Every day | ORAL | Status: DC
  Administered 2016-11-08 – 2016-11-12 (×5): 20 mg via ORAL
  Filled 2016-11-08 (×5): qty 1

## 2016-11-08 MED ORDER — METHYLPREDNISOLONE SODIUM SUCC 125 MG IJ SOLR: 125.0000 mg | Freq: Once | INTRAMUSCULAR | Status: DC

## 2016-11-08 MED ORDER — AMLODIPINE BESYLATE 10 MG PO TABS
10.0000 mg | ORAL_TABLET | Freq: Every day | ORAL | Status: DC
  Administered 2016-11-09 – 2016-11-13 (×5): 10 mg via ORAL
  Filled 2016-11-08 (×5): qty 1

## 2016-11-08 MED ORDER — BRIMONIDINE TARTRATE-TIMOLOL 0.2-0.5 % OP SOLN
1.0000 [drp] | Freq: Two times a day (BID) | OPHTHALMIC | Status: DC
Start: 2016-11-08 — End: 2016-11-08

## 2016-11-08 MED ORDER — MIRABEGRON ER 25 MG PO TB24
25.0000 mg | ORAL_TABLET | Freq: Every day | ORAL | Status: DC
  Administered 2016-11-09 – 2016-11-13 (×5): 25 mg via ORAL
  Filled 2016-11-08 (×5): qty 1

## 2016-11-08 MED ORDER — TAMSULOSIN HCL 0.4 MG PO CAPS
0.4000 mg | ORAL_CAPSULE | Freq: Every day | ORAL | Status: DC
  Administered 2016-11-08 – 2016-11-12 (×5): 0.4 mg via ORAL
  Filled 2016-11-08 (×5): qty 1

## 2016-11-08 MED ORDER — ISOSORB DINITRATE-HYDRALAZINE 20-37.5 MG PO TABS
1.0000 | ORAL_TABLET | Freq: Two times a day (BID) | ORAL | Status: DC
  Administered 2016-11-09 – 2016-11-13 (×10): 1 via ORAL
  Filled 2016-11-08 (×10): qty 1

## 2016-11-08 MED ORDER — FERROUS SULFATE 325 (65 FE) MG PO TABS
325.0000 mg | ORAL_TABLET | Freq: Every day | ORAL | Status: DC
  Administered 2016-11-09 – 2016-11-13 (×5): 325 mg via ORAL
  Filled 2016-11-08 (×5): qty 1

## 2016-11-08 MED ORDER — VANCOMYCIN HCL IN DEXTROSE 750-5 MG/150ML-% IV SOLN
750.0000 mg | INTRAVENOUS | Status: DC
  Administered 2016-11-09: 750 mg via INTRAVENOUS
  Filled 2016-11-08 (×3): qty 150

## 2016-11-08 NOTE — ED Triage Notes (Addendum)
Pt arrived via EMS from [facility]. Pt experiencing SOB. Per EMS, pt's oxygen level was mid-80s upon arrival. Pt was placed on 4Lpm [place] which increased Sp02 to mid-90s. Pt has hx of COPD, CHF, and dementia. Pt has diminished lung sounds bilaterally.Rales and rhonchi also noted. Pt is alert but confused at baseline.

## 2016-11-08 NOTE — ED Notes (Signed)
EDP notified of critical INR value.

## 2016-11-08 NOTE — H&P (Signed)
History and Physical    Kurt Thomas QIO:[id number] DOB: February 25, 1919 DOA: 11/08/2016  PCP: Melinda Crutch, MD Consultants:  [facility]-orthopedics; Tanner - eye Patient coming from: [facility]; NOK: daughter, [phone]  Chief Complaint: respiratory failure  HPI: Kurt Thomas is a 83 y.o. male with medical history significant of remote CVA; remote prostate CA; HTN; afib on Coumadin with difficulty maintaining appropriate INR; CAD; and Alzheimer's dementia presenting after [facility] called the family today because he was having a hard time breathing and they were bringing him to the ER.  h/o hip fracture, went to SNF rehab and did well with PT/OT. Wheelchair bound since the 2nd hip fracture - Nov/Dec. Had a bedsore that finally healed up and so went back to [facility].  He did have a CT yesterday due to concern for renal mass (Dr. Harrington Challenger has been following it and was concerned about persistent weight loss and so decided to scan him yesterday).  No SOB yesterday.  No fevers.    Difficulties with Coumadin, sometimes too low and sometimes too high.  On Coumadin for afib.     ED Course:  Respiratory distress and hypoxia reported by nursing facility.  P 109.  Increased WOB, placed on BIPAP.  INR 7.6, no bleeding.  CXR with bilateral infiltrates, given Vanc/Cefepime.    Review of Systems:  Unable to perform due to BIPAP and dementia (he was concerned about going to driving school)   Ambulatory Status:  Wheelchair bound since Nov-Dec  Past Medical History:  Diagnosis Date  . Alzheimer's disease, focal onset   . CAD (coronary artery disease)   . Chronic atrial fibrillation ([place])   . Essential hypertension   . Low back pain   . Prostate cancer ([place]) 1980s  . Recurrent major depression ([place])   . Stroke [facility]) 1990s    Past Surgical History:  Procedure Laterality Date  . CATARACT EXTRACTION    . CORONARY ARTERY BYPASS GRAFT    . FEMUR IM NAIL Right 06/05/2016   Procedure:  INTRAMEDULLARY (IM) NAIL FEMORAL;  Surgeon: Mcarthur Rossetti, MD;  Location: [facility];  Service: Orthopedics;  Laterality: Right;  . HIP ARTHROPLASTY Left 06/22/2014   Procedure: LEFT HEMIARTHROPLASTY HIP;  Surgeon: Meredith Pel, MD;  Location: [facility];  Service: Orthopedics;  Laterality: Left;  . PROSTATECTOMY      Social History   Social History  . Marital status: Married    Spouse name: N/A  . Number of children: 1  . Years of education: college   Occupational History  .      Retired   Social History Main Topics  . Smoking status: Former Smoker    Packs/day: 1.00    Years: 1.00    Types: Cigarettes    Quit date: 07/10/1956  . Smokeless tobacco: Never Used  . Alcohol use No  . Drug use: No  . Sexual activity: Not on file   Other Topics Concern  . Not on file   Social History Narrative   Patient is retired and Lives in [place]    Education college    Right handed.    No Known Allergies  Family History  Problem Relation Age of Onset  . Cancer Father   . High blood pressure    . Prostate cancer Brother 54    identical twin    Prior to Admission medications   Medication Sig Start Date End Date Taking? Authorizing Provider  amLODipine (NORVASC) 10 MG tablet Take  1 tablet (10 mg total) by mouth daily. 09/08/14  Yes Oswald Hillock, MD  brimonidine-timolol (COMBIGAN) 0.2-0.5 % ophthalmic solution Place 1 drop into both eyes 2 (two) times daily.    Yes Historical Provider, MD  carvedilol (COREG) 12.5 MG tablet Take 12.5 mg by mouth every morning.    Yes Historical Provider, MD  DULoxetine (CYMBALTA) 30 MG capsule Take 30 mg by mouth daily.    Yes Historical Provider, MD  ferrous sulfate 325 (65 FE) MG tablet Take 325 mg by mouth daily.    Yes Historical Provider, MD  fluticasone (FLONASE) 50 MCG/ACT nasal spray Place 2 sprays into both nostrils at bedtime.   Yes Historical Provider, MD  HYDROcodone-acetaminophen (NORCO/VICODIN) 5-325 MG tablet Take 1 tablet  by mouth every 6 (six) hours as needed for severe pain. Patient taking differently: Take 1 tablet by mouth every 6 (six) hours as needed (for pain). MAX #4/24 HOURS 07/28/16  Yes Hosie Poisson, MD  isosorbide-hydrALAZINE (BIDIL) 20-37.5 MG per tablet Take 1 tablet by mouth 2 (two) times daily. 06/24/14  Yes Barton Dubois, MD  latanoprost (XALATAN) 0.005 % ophthalmic solution Place 1 drop into both eyes at bedtime.   Yes Historical Provider, MD  Menthol-Zinc Oxide (CALMOSEPTINE) 0.44-20.6 % OINT Apply 1 application topically See admin instructions. TO BUTTOCKS ONCE A DAY PREVENTATIVELY   Yes Historical Provider, MD  mirabegron ER (MYRBETRIQ) 25 MG TB24 tablet Take 25 mg by mouth daily with breakfast.    Yes Historical Provider, MD  mirtazapine (REMERON) 15 MG tablet Take 15 mg by mouth at bedtime.    Yes Historical Provider, MD  Multiple Vitamins-Minerals (DECUBI-VITE PO) Take 1 tablet by mouth daily.   Yes Historical Provider, MD  neomycin-bacitracin-polymyxin (NEOSPORIN) ointment Apply 1 application topically daily. For wound care for 10 days   Yes Historical Provider, MD  ondansetron (ZOFRAN) 4 MG tablet Take 4 mg by mouth every 6 (six) hours as needed for nausea or vomiting.    Yes Historical Provider, MD  polyethylene glycol (MIRALAX / GLYCOLAX) packet Take 17 g by mouth daily as needed for mild constipation. Mix in 8 oz liquid and drink   Yes Historical Provider, MD  ranitidine (ZANTAC) 150 MG tablet Take 150 mg by mouth at bedtime.    Yes Historical Provider, MD  tamsulosin (FLOMAX) 0.4 MG CAPS capsule Take 0.4 mg by mouth at bedtime.    Yes Historical Provider, MD  UNABLE TO FIND Mighty Shake: Drink 1 shake by mouth three times a day   Yes Historical Provider, MD  warfarin (COUMADIN) 4 MG tablet Take 4 mg by mouth See admin instructions. IN THE EVENING ON MON/FRI   Yes Historical Provider, MD  warfarin (COUMADIN) 5 MG tablet Take 5 mg by mouth See admin instructions. IN THE EVENING ON  SUN/TUES/WED/THURS/SAT   Yes Historical Provider, MD  albuterol (PROVENTIL) (2.5 MG/3ML) 0.083% nebulizer solution Take 2.5 mg by nebulization every 6 (six) hours as needed for wheezing.     Historical Provider, MD  Amino Acids-Protein Hydrolys (FEEDING SUPPLEMENT, PRO-STAT SUGAR FREE 64,) LIQD Take 30 mLs by mouth 2 (two) times daily.    Historical Provider, MD  diphenhydrAMINE (BENYLIN) 12.5 MG/5ML syrup Take 12.5 mg by mouth 3 (three) times daily as needed for itching.    Historical Provider, MD  docusate sodium (COLACE) 100 MG capsule Take 1 capsule (100 mg total) by mouth daily as needed for mild constipation. Patient not taking: Reported on 11/08/2016 09/08/14   Oswald Hillock,  MD  ipratropium-albuterol (DUONEB) 0.5-2.5 (3) MG/3ML SOLN Take 3 mLs by nebulization every 6 (six) hours as needed. Patient not taking: Reported on 11/08/2016 07/28/16   Hosie Poisson, MD  loperamide (IMODIUM) 2 MG capsule Take 2 mg by mouth every 6 (six) hours as needed for diarrhea or loose stools. MAX #4 capsules/24 hours    Historical Provider, MD  NUTRITIONAL SUPPLEMENT LIQD Take 1 each by mouth daily with lunch. *Magic Cup*    Historical Provider, MD  Nutritional Supplements (NUTRITIONAL DRINK) LIQD Take 120 mLs by mouth 3 (three) times daily between meals. *Med Pass*    Historical Provider, MD  senna-docusate (SENOKOT-S) 8.6-50 MG tablet Take 2 tablets by mouth 2 (two) times daily.     Historical Provider, MD    Physical Exam: Vitals:   11/08/16 2343 11/09/16 0000 11/09/16 0056 11/09/16 0059  BP: (!) 141/66 112/70    Pulse: 82 83    Resp: (!) 26 18    Temp:      TempSrc:      SpO2: 100% 97% 94% 94%  Weight:      Height:         General: Appears calm and comfortable and is NAD on BIPAP Eyes:  PERRL, EOMI, normal lids, iris ENT:  grossly normal hearing, lips & tongue, mmm Neck:  no LAD, masses or thyromegaly Cardiovascular:  Mild tachycardia, no m/r/g. No LE edema.  Respiratory:  CTA bilaterally, no w/r/r.  Normal respiratory effort. Abdomen:  soft, ntnd, NABS Skin:  no rash or induration seen on limited exam Musculoskeletal:  grossly normal tone BUE/BLE, good ROM, no bony abnormality Psychiatric:  grossly normal mood and affect, speech fluent and appropriate, AOx3 Neurologic:  CN 2-12 grossly intact, moves all extremities in coordinated fashion, sensation intact  Labs on Admission: I have personally reviewed following labs and imaging studies  CBC:  Recent Labs Lab 11/08/16 1845  WBC 12.8*  NEUTROABS 11.4*  HGB 9.5*  HCT 30.5*  MCV 94.1  PLT 476   Basic Metabolic Panel:  Recent Labs Lab 11/08/16 1845  NA 137  K 4.6  CL 105  CO2 22  GLUCOSE 120*  BUN 33*  CREATININE 1.63*  CALCIUM 9.2   GFR: Estimated Creatinine Clearance: 23.1 mL/min (A) (by C-G formula based on SCr of 1.63 mg/dL (H)). Liver Function Tests:  Recent Labs Lab 11/08/16 1845  AST 17  ALT 14*  ALKPHOS 100  BILITOT 0.5  PROT 6.2*  ALBUMIN 3.5   No results for input(s): LIPASE, AMYLASE in the last 168 hours. No results for input(s): AMMONIA in the last 168 hours. Coagulation Profile:  Recent Labs Lab 11/08/16 1845  INR 7.61*   Cardiac Enzymes: No results for input(s): CKTOTAL, CKMB, CKMBINDEX, TROPONINI in the last 168 hours. BNP (last 3 results) No results for input(s): PROBNP in the last 8760 hours. HbA1C: No results for input(s): HGBA1C in the last 72 hours. CBG: No results for input(s): GLUCAP in the last 168 hours. Lipid Profile: No results for input(s): CHOL, HDL, LDLCALC, TRIG, CHOLHDL, LDLDIRECT in the last 72 hours. Thyroid Function Tests: No results for input(s): TSH, T4TOTAL, FREET4, T3FREE, THYROIDAB in the last 72 hours. Anemia Panel: No results for input(s): VITAMINB12, FOLATE, FERRITIN, TIBC, IRON, RETICCTPCT in the last 72 hours. Urine analysis:    Component Value Date/Time   COLORURINE YELLOW 07/24/2016 1554   APPEARANCEUR HAZY (A) 07/24/2016 1554   LABSPEC 1.015  07/24/2016 1554   PHURINE 5.0 07/24/2016 1554   GLUCOSEU  NEGATIVE 07/24/2016 1554   HGBUR MODERATE (A) 07/24/2016 1554   BILIRUBINUR NEGATIVE 07/24/2016 1554   KETONESUR NEGATIVE 07/24/2016 1554   PROTEINUR NEGATIVE 07/24/2016 1554   UROBILINOGEN 0.2 09/03/2014 2025   NITRITE NEGATIVE 07/24/2016 1554   LEUKOCYTESUR LARGE (A) 07/24/2016 1554    Creatinine Clearance: Estimated Creatinine Clearance: 23.1 mL/min (A) (by C-G formula based on SCr of 1.63 mg/dL (H)).  Sepsis Labs: @LABRCNTIP (procalcitonin:4,lacticidven:4) )No results found for this or any previous visit (from the past 240 hour(s)).   Radiological Exams on Admission: Ct Abdomen Pelvis W Contrast  Result Date: 11/07/2016 CLINICAL DATA:  Weight loss, diarrhea, occasional melena.  Dementia. EXAM: CT ABDOMEN AND PELVIS WITH CONTRAST TECHNIQUE: Multidetector CT imaging of the abdomen and pelvis was performed using the standard protocol following bolus administration of intravenous contrast. CONTRAST:  9mL ISOVUE-300 IOPAMIDOL (ISOVUE-300) INJECTION 61% Creatinine was obtained on site at [facility] at [address] Results: Creatinine 1.6 mg/dL. COMPARISON:  Multiple exams, including pelvic radiographs of 09/21/2016 and abdominal ultrasound 09/04/2014 FINDINGS: Lower chest: Cardiomegaly, with four-chamber enlargement but especially of the right atrium. Epicardial pacer leads. Loculated right pleural effusion and more free-flowing left pleural effusion with some pleural enhancement is specially on the right, and right pleural thickening. Calcifications along the aortic and mitral valve. Hepatobiliary: Mild heterogeneity on the initial postcontrast series in the hepatic parenchyma is ascribed to the early phase of contrast. No focal hepatic lesion is identified. No biliary dilatation. Pancreas: Suspected 1.1 by 0.8 cm cystic lesion of the tail the pancreas on image 26/3. Spleen: Unremarkable Adrenals/Urinary Tract: Adrenal  glands normal. 5.8 by 4.7 by 5.4 cm (volume = 77 cm^3) solid enhancing mass in the left kidney upper pole extending into the left renal hilum, with central necrosis or central scar observed, and lobulations along the inferior margin of this mass. Notably, a similar size mass was identified in this region on the abdominal ultrasound from 09/04/2014. The mass splays branches of the renal artery. No definite tumor thrombus is seen in the renal vein or IVC. Multiple hypodense renal lesions are likely cysts, and are larger on the left than the right. Urinary bladder unremarkable although partially obscured by streak artifact related to the pelvic vascular clips and bilateral hip fixation. Stomach/Bowel: There is thickening of some of the stomach wall including the gastric cardia and antrum although much of this may be related to nondistention rather than actual pathologic thickening. Mild prominence of stool in the rectal vault. Sigmoid colon diverticulosis without active diverticulitis. 0.9 by 1.2 cm filling defect in the mid transverse colon on image 29/601 could represent a colonic polyp, and appears more solid than the rest of the scattered stool in the colon. Vascular/Lymphatic: Aortoiliac atherosclerotic vascular disease. Infrarenal fusiform abdominal aortic ectasia up to 2.9 cm diameter, image 42/3. No pathologic adenopathy identified. Reproductive: Prostatectomy. The prostate bed is obscured by streak artifact from the hip implants. Other: Low-level presacral edema. Subcutaneous edema noted along the buttocks and perineum. Musculoskeletal: Callus formation in late healing response of right anterior seventh, eighth, ninth, and tenth rib fractures. Right hip screw.  Left hip hemiarthroplasty. Lumbar spondylosis and degenerative disc disease along with somewhat congenitally short pedicles, contributing to impingement at L3-4, L4-5, and L5-S 1. IMPRESSION: 1. 77 cubic cm solid enhancing mass in the left kidney upper  pole, without much in the way of growth compared to prior ultrasound exam from 09/04/2014, probably a slow growing renal cell carcinoma, less likely but possibly oncocytoma. No current  adenopathy or tumor thrombus in the left renal vein. 2. Considerable cardiomegaly, with loculated right pleural effusion and pleural thickening, and more free-flowing left pleural effusion but with some mild pleural enhancement. Accordingly exudative effusions are suspected. 3. 1.1 cm cystic lesion in the tail the pancreas. Based on the patient' s age I do not feel that this warrants further workup. 4. Potential 1.2 cm polyp in the mid transverse colon, image 29/601. 5. Aortic Atherosclerosis (ICD10-I70.0). Infrarenal abdominal aortic ectasia at 2.9 cm diameter. 6. Low-level presacral edema and subcutaneous edema along the pelvis, uncertain significance. 7. Late phase healing response of several right anterior lower rib fractures. 8. Multilevel impingement in the lower lumbar spine due to spondylosis and degenerative disc disease. Electronically Signed   By: Van Clines M.D.   On: 11/07/2016 14:01   Dg Chest Port 1 View  Result Date: 11/08/2016 CLINICAL DATA:  Shortness of Breath EXAM: PORTABLE CHEST 1 VIEW COMPARISON:  07/24/2016 FINDINGS: Cardiac shadow is mildly enlarged but stable. Postsurgical changes are again seen the lungs are well aerated bilaterally. Diffuse infiltrate is noted throughout the right lung and to a lesser degree in the left lung base. No bony abnormality is noted. IMPRESSION: Bilateral infiltrates right greater than left. Electronically Signed   By: Inez Catalina M.D.   On: 11/08/2016 19:25    EKG: Independently reviewed.  Afib with rate 96; difficult to interpret due to artifact but does not appear to have acute ischemia  Assessment/Plan Principal Problem:   Acute respiratory failure (HCC) Active Problems:   Atrial fibrillation (HCC)   CKD (chronic kidney disease) stage 3, GFR 30-59 ml/min    Chronic anticoagulation   HCAP (healthcare-associated pneumonia)   Sepsis ([place])   Hyperglycemia   Renal mass   Acute respiratory failure with sepsis resulting from HCAP -Elevated WBC count (12.8), tachycardia, tachypnea with normal lactate  -While awaiting blood cultures, this may be a preseptic condition. -Sepsis protocol initiated -Given productive cough, markedly increased WOB and bilateral infiltrates on chest x-ray, most likely pneumonia.  -Given that he resides in a nursing facility, this would be considered HCAP. -CURB-65 score is 4, meaning that the patient has a 40% risk of death . -He was started on BIPAP in the ER and appears to be doing well on this; will continue overnight. -Will continue to treat with Cefepime and Vancomycin (started in the ER). -NS @ 75cc/hr -Repeat CBC in am -Sputum cultures -Blood cultures -Strep pneumo testing -albuterol PRN -Standing Duonebs -Will admit to SDU with telemetry and continue to monitor  Afib on Coumadin -Patient with suboptimal rate control on arrival but this has improved with BIPAP and treatment of respiratory failure -Currently rate-controlled -CHA2DS2-VASc score appears to be 6, with an adjusted stroke rate of 9.8%/year -Despite this extremely high stroke risk, he is also quite elderly and this must be considered. -He is almost certainly a fall risk -His INR has been difficult to control, currently 7.61 -He is not actively bleeding so will plan to allow it drift down for now without intervention -If he bleeds, he will need vitamin K -Would consider cessation of Coumadin and assumption of the elevated stroke risk either inpatient or outpatient -BNP 265.4, no prior -Troponin 0 -Hgb 9.5, stable; no current concern for bleeding -Monitor daily INR for now  CKD, renal mass -BUN 33/Creatinine 1.63/GFP 34, stable -CT yesterday appears to confirm a slow-growing renal cell carcinoma -The patient gets very upset with the word  "cancer" so please avoid saying it  in front of him if possible -With this current cancer, he appears to be more likely to die with it than from it based on its slow rate of growth  Hyperglycemia -Glucose 120 -May be stress response -Will follow with fasting AM labs -It is unlikely that he will need acute or chronic treatment for this issue  Dementia -Family reports overall good quality of life, which is their goal for him at this time -He enjoys visiting with friends and is quite social, despite his inability to walk -His family does not wish to escalate his care further at this time   DVT prophylaxis: Supratherapeutic INR Code Status: DNR - confirmed with family.  No escalation of care - no transfer to ICU, no pressors, no CVL. Family Communication: Daughter, her husband, and granddaughter present throughout evaluation and very reasonable and in agreement with plans as above Disposition Plan: To be determined Consults called: None Admission status: Admit - It is my clinical opinion that admission to INPATIENT is reasonable and necessary because this patient will require at least 2 midnights in the hospital to treat this condition based on the medical complexity of the problems presented.  Given the aforementioned information, the predictability of an adverse outcome is felt to be significant.    Karmen Bongo MD [facility]  If 7PM-7AM, please contact night-coverage [email or web address] Password [email or web address]  11/09/2016, 1:09 AM

## 2016-11-08 NOTE — Progress Notes (Signed)
RT NOTE:  Pt transported to 4N11 without event. Report given to [facility], RRT.

## 2016-11-08 NOTE — ED Provider Notes (Signed)
[facility] DEPT Provider Note   CSN: [id number] Arrival date & time: 11/08/16  1757     History   Chief Complaint Chief Complaint  Patient presents with  . Shortness of Breath    HPI Kurt Thomas is a 83 y.o. male.  83yo M w/ PMH including dementia, CVA, CAD, A fib, CKD who p/w shortness of breath. Patient was sent from his nursing facility where he was noted to be short of breath. EMS noted that he was in the mid 40s on room air. He states that he has had progressively worsening shortness of breath for the past few weeks and he endorses a cough. He denies any chest or abdominal pain. EMS reports a history of COPD and CHF but I do not see this on the patient's chart.  LEVEL 5 CAVEAT DUE TO DEMENTIA AND RESPIRATORY DISTRESS   The history is provided by the patient.  Shortness of Breath     Past Medical History:  Diagnosis Date  . Alzheimer's disease, focal onset   . Atrial fibrillation ([place])   . CAD (coronary artery disease)   . Chronic atrial fibrillation ([place])   . High blood pressure   . Low back pain   . Memory loss   . Prostate cancer ([place])   . Recurrent major depression ([place])   . Stroke [facility])     Patient Active Problem List   Diagnosis Date Noted  . PNA (pneumonia) 07/25/2016  . HCAP (healthcare-associated pneumonia) 07/24/2016  . AKI (acute kidney injury) ([place]) 07/24/2016  . Diarrhea 07/24/2016  . Normocytic anemia 07/24/2016  . Subtherapeutic international normalized ratio (INR) 07/24/2016  . Pressure injury of skin 06/08/2016  . Chronic anticoagulation 06/05/2016  . BPH (benign prostatic hyperplasia) 06/05/2016  . Fractured hip, right, closed, initial encounter ([place]) 06/04/2016  . Protein-calorie malnutrition, severe ([place]) 09/05/2014  . PVC's (premature ventricular contractions)   . CKD (chronic kidney disease) stage 3, GFR 30-59 ml/min 09/03/2014  . Acute encephalopathy 09/03/2014  . Vomiting 09/03/2014  . Difficulty urinating 09/03/2014  . CAD  (coronary artery disease)   . Essential hypertension 06/22/2014  . Atrial fibrillation ([place]) 06/21/2014  . Hip fracture ([place]) 06/20/2014  . Renal insufficiency 06/20/2014  . Closed left hip fracture ([place])   . Lumbar stenosis 05/07/2014  . Low back pain 03/31/2014  . Gait difficulty 03/31/2014  . High blood pressure     Past Surgical History:  Procedure Laterality Date  . CATARACT EXTRACTION    . CORONARY ARTERY BYPASS GRAFT    . FEMUR IM NAIL Right 06/05/2016   Procedure: INTRAMEDULLARY (IM) NAIL FEMORAL;  Surgeon: Mcarthur Rossetti, MD;  Location: [facility];  Service: Orthopedics;  Laterality: Right;  . HIP ARTHROPLASTY Left 06/22/2014   Procedure: LEFT HEMIARTHROPLASTY HIP;  Surgeon: Meredith Pel, MD;  Location: [facility];  Service: Orthopedics;  Laterality: Left;  . PROSTATECTOMY         Home Medications    Prior to Admission medications   Medication Sig Start Date End Date Taking? Authorizing Provider  amLODipine (NORVASC) 10 MG tablet Take 1 tablet (10 mg total) by mouth daily. 09/08/14  Yes Oswald Hillock, MD  brimonidine-timolol (COMBIGAN) 0.2-0.5 % ophthalmic solution Place 1 drop into both eyes 2 (two) times daily.    Yes Historical Provider, MD  carvedilol (COREG) 12.5 MG tablet Take 12.5 mg by mouth every morning.    Yes Historical Provider, MD  DULoxetine (CYMBALTA) 30 MG capsule Take 30 mg by mouth daily.  Yes Historical Provider, MD  ferrous sulfate 325 (65 FE) MG tablet Take 325 mg by mouth daily.    Yes Historical Provider, MD  fluticasone (FLONASE) 50 MCG/ACT nasal spray Place 2 sprays into both nostrils at bedtime.   Yes Historical Provider, MD  HYDROcodone-acetaminophen (NORCO/VICODIN) 5-325 MG tablet Take 1 tablet by mouth every 6 (six) hours as needed for severe pain. Patient taking differently: Take 1 tablet by mouth every 6 (six) hours as needed (for pain). MAX #4/24 HOURS 07/28/16  Yes Hosie Poisson, MD  isosorbide-hydrALAZINE (BIDIL) 20-37.5 MG per  tablet Take 1 tablet by mouth 2 (two) times daily. 06/24/14  Yes Barton Dubois, MD  latanoprost (XALATAN) 0.005 % ophthalmic solution Place 1 drop into both eyes at bedtime.   Yes Historical Provider, MD  Menthol-Zinc Oxide (CALMOSEPTINE) 0.44-20.6 % OINT Apply 1 application topically See admin instructions. TO BUTTOCKS ONCE A DAY PREVENTATIVELY   Yes Historical Provider, MD  mirabegron ER (MYRBETRIQ) 25 MG TB24 tablet Take 25 mg by mouth daily with breakfast.    Yes Historical Provider, MD  mirtazapine (REMERON) 15 MG tablet Take 15 mg by mouth at bedtime.    Yes Historical Provider, MD  Multiple Vitamins-Minerals (DECUBI-VITE PO) Take 1 tablet by mouth daily.   Yes Historical Provider, MD  neomycin-bacitracin-polymyxin (NEOSPORIN) ointment Apply 1 application topically daily. For wound care for 10 days   Yes Historical Provider, MD  ondansetron (ZOFRAN) 4 MG tablet Take 4 mg by mouth every 6 (six) hours as needed for nausea or vomiting.    Yes Historical Provider, MD  polyethylene glycol (MIRALAX / GLYCOLAX) packet Take 17 g by mouth daily as needed for mild constipation. Mix in 8 oz liquid and drink   Yes Historical Provider, MD  ranitidine (ZANTAC) 150 MG tablet Take 150 mg by mouth at bedtime.    Yes Historical Provider, MD  tamsulosin (FLOMAX) 0.4 MG CAPS capsule Take 0.4 mg by mouth at bedtime.    Yes Historical Provider, MD  UNABLE TO FIND Mighty Shake: Drink 1 shake by mouth three times a day   Yes Historical Provider, MD  warfarin (COUMADIN) 4 MG tablet Take 4 mg by mouth See admin instructions. IN THE EVENING ON MON/FRI   Yes Historical Provider, MD  warfarin (COUMADIN) 5 MG tablet Take 5 mg by mouth See admin instructions. IN THE EVENING ON SUN/TUES/WED/THURS/SAT   Yes Historical Provider, MD  albuterol (PROVENTIL) (2.5 MG/3ML) 0.083% nebulizer solution Take 2.5 mg by nebulization every 6 (six) hours as needed for wheezing.     Historical Provider, MD  Amino Acids-Protein Hydrolys (FEEDING  SUPPLEMENT, PRO-STAT SUGAR FREE 64,) LIQD Take 30 mLs by mouth 2 (two) times daily.    Historical Provider, MD  diphenhydrAMINE (BENYLIN) 12.5 MG/5ML syrup Take 12.5 mg by mouth 3 (three) times daily as needed for itching.    Historical Provider, MD  docusate sodium (COLACE) 100 MG capsule Take 1 capsule (100 mg total) by mouth daily as needed for mild constipation. 09/08/14   Oswald Hillock, MD  ipratropium-albuterol (DUONEB) 0.5-2.5 (3) MG/3ML SOLN Take 3 mLs by nebulization every 6 (six) hours as needed. 07/28/16   Hosie Poisson, MD  loperamide (IMODIUM) 2 MG capsule Take 2 mg by mouth every 6 (six) hours as needed for diarrhea or loose stools. MAX #4 capsules/24 hours    Historical Provider, MD  NUTRITIONAL SUPPLEMENT LIQD Take 1 each by mouth daily with lunch. *Magic Cup*    Historical Provider, MD  Nutritional Supplements (  NUTRITIONAL DRINK) LIQD Take 120 mLs by mouth 3 (three) times daily between meals. *Med Pass*    Historical Provider, MD  senna-docusate (SENOKOT-S) 8.6-50 MG tablet Take 2 tablets by mouth 2 (two) times daily.     Historical Provider, MD    Family History Family History  Problem Relation Age of Onset  . Cancer Father   . High blood pressure      Social History Social History  Substance Use Topics  . Smoking status: Former Smoker    Packs/day: 1.00    Years: 1.00    Types: Cigarettes    Quit date: 07/10/1956  . Smokeless tobacco: Never Used  . Alcohol use No     Allergies   Patient has no known allergies.   Review of Systems Review of Systems  Respiratory: Positive for shortness of breath.    Unable to obtain ROS 2/2 respiratory distress  Physical Exam Updated Vital Signs BP (!) 152/74   Pulse (!) 109   Temp 99 F (37.2 C) (Oral)   Resp (!) 34   SpO2 100%   Physical Exam  Constitutional: No distress.  Thin, frail elderly man awake, dyspneic  HENT:  Head: Normocephalic and atraumatic.  Eyes: Conjunctivae are normal. Pupils are equal, round, and  reactive to light.  Neck: Neck supple.  Cardiovascular: Regular rhythm and normal heart sounds.  Tachycardia present.   No murmur heard. Pulmonary/Chest:  Increased WOB with retractions and tachypnea, rales b/l in all lung fields, no severe respiratory distress  Abdominal: Soft. Bowel sounds are normal. He exhibits no distension. There is no tenderness.  Musculoskeletal: He exhibits no edema.  Neurological: He is alert.  Oriented to person and place, following commands  Skin: Skin is warm and dry.  Nursing note and vitals reviewed.    ED Treatments / Results  Labs (all labs ordered are listed, but only abnormal results are displayed) Labs Reviewed  COMPREHENSIVE METABOLIC PANEL - Abnormal; Notable for the following:       Result Value   Glucose, Bld 120 (*)    BUN 33 (*)    Creatinine, Ser 1.63 (*)    Total Protein 6.2 (*)    ALT 14 (*)    GFR calc non Af Amer 34 (*)    GFR calc Af Amer 39 (*)    All other components within normal limits  CBC WITH DIFFERENTIAL/PLATELET - Abnormal; Notable for the following:    WBC 12.8 (*)    RBC 3.24 (*)    Hemoglobin 9.5 (*)    HCT 30.5 (*)    Neutro Abs 11.4 (*)    Lymphs Abs 0.4 (*)    All other components within normal limits  BRAIN NATRIURETIC PEPTIDE - Abnormal; Notable for the following:    B Natriuretic Peptide 265.4 (*)    All other components within normal limits  PROTIME-INR - Abnormal; Notable for the following:    Prothrombin Time 66.9 (*)    INR 7.61 (*)    All other components within normal limits  I-STAT TROPOININ, ED  I-STAT CG4 LACTIC ACID, ED    EKG  EKG Interpretation  Date/Time:  Wednesday Nov 08 2016 18:13:31 EDT Ventricular Rate:  96 PR Interval:    QRS Duration: 95 QT Interval:  373 QTC Calculation: 452 R Axis:   -63 Text Interpretation:  Atrial fibrillation Paired ventricular premature complexes Left anterior fascicular block Low voltage, extremity leads Abnormal R-wave progression, early transition  Borderline repolarization abnormality Borderline ST elevation,  anterior leads Interpretation limited secondary to artifact Confirmed by LITTLE MD, RACHEL [phone]) on 11/08/2016 6:22:00 PM       Radiology Ct Abdomen Pelvis W Contrast  Result Date: 11/07/2016 CLINICAL DATA:  Weight loss, diarrhea, occasional melena.  Dementia. EXAM: CT ABDOMEN AND PELVIS WITH CONTRAST TECHNIQUE: Multidetector CT imaging of the abdomen and pelvis was performed using the standard protocol following bolus administration of intravenous contrast. CONTRAST:  31mL ISOVUE-300 IOPAMIDOL (ISOVUE-300) INJECTION 61% Creatinine was obtained on site at [facility] at [address] Results: Creatinine 1.6 mg/dL. COMPARISON:  Multiple exams, including pelvic radiographs of 09/21/2016 and abdominal ultrasound 09/04/2014 FINDINGS: Lower chest: Cardiomegaly, with four-chamber enlargement but especially of the right atrium. Epicardial pacer leads. Loculated right pleural effusion and more free-flowing left pleural effusion with some pleural enhancement is specially on the right, and right pleural thickening. Calcifications along the aortic and mitral valve. Hepatobiliary: Mild heterogeneity on the initial postcontrast series in the hepatic parenchyma is ascribed to the early phase of contrast. No focal hepatic lesion is identified. No biliary dilatation. Pancreas: Suspected 1.1 by 0.8 cm cystic lesion of the tail the pancreas on image 26/3. Spleen: Unremarkable Adrenals/Urinary Tract: Adrenal glands normal. 5.8 by 4.7 by 5.4 cm (volume = 77 cm^3) solid enhancing mass in the left kidney upper pole extending into the left renal hilum, with central necrosis or central scar observed, and lobulations along the inferior margin of this mass. Notably, a similar size mass was identified in this region on the abdominal ultrasound from 09/04/2014. The mass splays branches of the renal artery. No definite tumor thrombus is seen in the renal vein or  IVC. Multiple hypodense renal lesions are likely cysts, and are larger on the left than the right. Urinary bladder unremarkable although partially obscured by streak artifact related to the pelvic vascular clips and bilateral hip fixation. Stomach/Bowel: There is thickening of some of the stomach wall including the gastric cardia and antrum although much of this may be related to nondistention rather than actual pathologic thickening. Mild prominence of stool in the rectal vault. Sigmoid colon diverticulosis without active diverticulitis. 0.9 by 1.2 cm filling defect in the mid transverse colon on image 29/601 could represent a colonic polyp, and appears more solid than the rest of the scattered stool in the colon. Vascular/Lymphatic: Aortoiliac atherosclerotic vascular disease. Infrarenal fusiform abdominal aortic ectasia up to 2.9 cm diameter, image 42/3. No pathologic adenopathy identified. Reproductive: Prostatectomy. The prostate bed is obscured by streak artifact from the hip implants. Other: Low-level presacral edema. Subcutaneous edema noted along the buttocks and perineum. Musculoskeletal: Callus formation in late healing response of right anterior seventh, eighth, ninth, and tenth rib fractures. Right hip screw.  Left hip hemiarthroplasty. Lumbar spondylosis and degenerative disc disease along with somewhat congenitally short pedicles, contributing to impingement at L3-4, L4-5, and L5-S 1. IMPRESSION: 1. 77 cubic cm solid enhancing mass in the left kidney upper pole, without much in the way of growth compared to prior ultrasound exam from 09/04/2014, probably a slow growing renal cell carcinoma, less likely but possibly oncocytoma. No current adenopathy or tumor thrombus in the left renal vein. 2. Considerable cardiomegaly, with loculated right pleural effusion and pleural thickening, and more free-flowing left pleural effusion but with some mild pleural enhancement. Accordingly exudative effusions are  suspected. 3. 1.1 cm cystic lesion in the tail the pancreas. Based on the patient' s age I do not feel that this warrants further workup. 4. Potential 1.2 cm polyp in  the mid transverse colon, image 29/601. 5. Aortic Atherosclerosis (ICD10-I70.0). Infrarenal abdominal aortic ectasia at 2.9 cm diameter. 6. Low-level presacral edema and subcutaneous edema along the pelvis, uncertain significance. 7. Late phase healing response of several right anterior lower rib fractures. 8. Multilevel impingement in the lower lumbar spine due to spondylosis and degenerative disc disease. Electronically Signed   By: Van Clines M.D.   On: 11/07/2016 14:01   Dg Chest Port 1 View  Result Date: 11/08/2016 CLINICAL DATA:  Shortness of Breath EXAM: PORTABLE CHEST 1 VIEW COMPARISON:  07/24/2016 FINDINGS: Cardiac shadow is mildly enlarged but stable. Postsurgical changes are again seen the lungs are well aerated bilaterally. Diffuse infiltrate is noted throughout the right lung and to a lesser degree in the left lung base. No bony abnormality is noted. IMPRESSION: Bilateral infiltrates right greater than left. Electronically Signed   By: Inez Catalina M.D.   On: 11/08/2016 19:25    Procedures .Critical Care Performed by: Sharlett Iles Authorized by: Sharlett Iles   Critical care provider statement:    Critical care time (minutes):  30   Critical care time was exclusive of:  Separately billable procedures and treating other patients   Critical care was necessary to treat or prevent imminent or life-threatening deterioration of the following conditions:  Respiratory failure   Critical care was time spent personally by me on the following activities:  Blood draw for specimens, development of treatment plan with patient or surrogate, evaluation of patient's response to treatment, examination of patient, obtaining history from patient or surrogate, ordering and performing treatments and interventions,  ordering and review of laboratory studies, ordering and review of radiographic studies, re-evaluation of patient's condition and review of old charts   (including critical care time)  Medications Ordered in ED Medications  ceFEPIme (MAXIPIME) 1 g in dextrose 5 % 50 mL IVPB (not administered)  ipratropium-albuterol (DUONEB) 0.5-2.5 (3) MG/3ML nebulizer solution 3 mL (3 mLs Nebulization Given 11/08/16 1905)     Initial Impression / Assessment and Plan / ED Course  I have reviewed the triage vital signs and the nursing notes.  Pertinent labs & imaging results that were available during my care of the patient were reviewed by me and considered in my medical decision making (see chart for details).     Pt sent from Nursing facility for respiratory distress and hypoxia. Reportedly he has history of COPD and CHF but this does not appear on the patient's chart review. He was in mild distress on exam but appeared to be mentating appropriately and following commands. He had rales b/l. Afebrile, heart rate 109, BP 132/98. Placed on BiPAP due to his work of breathing and rales on exam.   Labwork shows normal lactate and troponin, WBC 12.8, hemoglobin 9.5, patient does have a history of anemia. BNP 265. INR is supratherapeutic at 7.6, patient has no evidence of bleeding currently therefore we will just hold his warfarin dosing. CXR shows bilateral infiltrates. Gave vancomycin and cefepime for HCAP coverage.  On reexamination, the patient remains on BiPAP with O2 sats in the high 90s on 50% FiO2. His work of breathing has improved. Discussed admission with [facility] hospitalist, Dr. Lorin Mercy, appreciate her assistance. Patient admitted for further treatment.  Final Clinical Impressions(s) / ED Diagnoses   Final diagnoses:  None    New Prescriptions New Prescriptions   No medications on file     Sharlett Iles, MD 11/08/16 2108

## 2016-11-08 NOTE — Progress Notes (Signed)
Pharmacy Antibiotic Note  Kurt Thomas is a 83 y.o. male admitted on 11/08/2016 with pneumonia.  Pharmacy has been consulted for vancomycin dosing. Patient is afebrile, WBC elevated at 12.8, CrCl ~ 23 ml/min  Plan: Cefepime 1g x1 per MD Vancomycin 1250 mg x1 then 750 mg IV every 24 hours.  Goal trough 15-20 mcg/mL. Monitor renal function, cultures, ability to de-escalate  Weight: 134 lb 14.7 oz (61.2 kg)  Temp (24hrs), Avg:99 F (37.2 C), Min:99 F (37.2 C), Max:99 F (37.2 C)   Recent Labs Lab 11/08/16 1845 11/08/16 1856  WBC 12.8*  --   CREATININE 1.63*  --   LATICACIDVEN  --  1.43    Estimated Creatinine Clearance: 22.9 mL/min (A) (by C-G formula based on SCr of 1.63 mg/dL (H)).    No Known Allergies  Antimicrobials this admission: 5/2 cefepime x1 5/2 vanc >>  Dose adjustments this admission: n/a  Microbiology results: n/a  Thank you for allowing pharmacy to be a part of this patient's care.   Gwenlyn Perking, PharmD PGY1 Pharmacy Resident Pager: [phone] 11/08/2016 7:53 PM

## 2016-11-09 ENCOUNTER — Ambulatory Visit (INDEPENDENT_AMBULATORY_CARE_PROVIDER_SITE_OTHER): Payer: Medicare Other | Admitting: Physician Assistant

## 2016-11-09 ENCOUNTER — Encounter (HOSPITAL_COMMUNITY): Payer: Self-pay | Admitting: Internal Medicine

## 2016-11-09 DIAGNOSIS — A419 Sepsis, unspecified organism: Secondary | ICD-10-CM | POA: Diagnosis present

## 2016-11-09 DIAGNOSIS — R739 Hyperglycemia, unspecified: Secondary | ICD-10-CM | POA: Diagnosis present

## 2016-11-09 DIAGNOSIS — F0390 Unspecified dementia without behavioral disturbance: Secondary | ICD-10-CM | POA: Diagnosis present

## 2016-11-09 DIAGNOSIS — J9600 Acute respiratory failure, unspecified whether with hypoxia or hypercapnia: Secondary | ICD-10-CM | POA: Diagnosis present

## 2016-11-09 DIAGNOSIS — N2889 Other specified disorders of kidney and ureter: Secondary | ICD-10-CM | POA: Diagnosis present

## 2016-11-09 LAB — CBC WITH DIFFERENTIAL/PLATELET
BASOS ABS: 0 10*3/uL (ref 0.0–0.1)
BASOS PCT: 0 %
EOS PCT: 0 %
Eosinophils Absolute: 0 10*3/uL (ref 0.0–0.7)
HEMATOCRIT: 25.1 % — AB (ref 39.0–52.0)
Hemoglobin: 8 g/dL — ABNORMAL LOW (ref 13.0–17.0)
Lymphocytes Relative: 4 %
Lymphs Abs: 0.6 10*3/uL — ABNORMAL LOW (ref 0.7–4.0)
MCH: 29.9 pg (ref 26.0–34.0)
MCHC: 31.9 g/dL (ref 30.0–36.0)
MCV: 93.7 fL (ref 78.0–100.0)
MONO ABS: 1.1 10*3/uL — AB (ref 0.1–1.0)
Monocytes Relative: 8 %
NEUTROS ABS: 12.7 10*3/uL — AB (ref 1.7–7.7)
Neutrophils Relative %: 88 %
PLATELETS: 185 10*3/uL (ref 150–400)
RBC: 2.68 MIL/uL — ABNORMAL LOW (ref 4.22–5.81)
RDW: 14.8 % (ref 11.5–15.5)
WBC: 14.5 10*3/uL — AB (ref 4.0–10.5)

## 2016-11-09 LAB — BASIC METABOLIC PANEL
ANION GAP: 9 (ref 5–15)
BUN: 34 mg/dL — ABNORMAL HIGH (ref 6–20)
CALCIUM: 8.7 mg/dL — AB (ref 8.9–10.3)
CO2: 21 mmol/L — ABNORMAL LOW (ref 22–32)
Chloride: 106 mmol/L (ref 101–111)
Creatinine, Ser: 1.67 mg/dL — ABNORMAL HIGH (ref 0.61–1.24)
GFR, EST AFRICAN AMERICAN: 38 mL/min — AB (ref >60)
GFR, EST NON AFRICAN AMERICAN: 33 mL/min — AB (ref >60)
GLUCOSE: 124 mg/dL — AB (ref 65–99)
Potassium: 4.8 mmol/L (ref 3.5–5.1)
SODIUM: 136 mmol/L (ref 135–145)

## 2016-11-09 LAB — MRSA PCR SCREENING: MRSA BY PCR: POSITIVE — AB

## 2016-11-09 LAB — PROTIME-INR
INR: 8.3 — AB
Prothrombin Time: 71.7 seconds — ABNORMAL HIGH (ref 11.4–15.2)

## 2016-11-09 MED ORDER — ENSURE ENLIVE PO LIQD
237.0000 mL | Freq: Two times a day (BID) | ORAL | Status: DC
  Administered 2016-11-09 – 2016-11-13 (×8): 237 mL via ORAL

## 2016-11-09 MED ORDER — MUPIROCIN 2 % EX OINT
1.0000 "application " | TOPICAL_OINTMENT | Freq: Two times a day (BID) | CUTANEOUS | Status: AC
  Administered 2016-11-09 – 2016-11-13 (×9): 1 via NASAL
  Filled 2016-11-09 (×5): qty 22

## 2016-11-09 MED ORDER — DEXTROSE 5 % IV SOLN
2.0000 mg | Freq: Once | INTRAVENOUS | Status: AC
  Administered 2016-11-09: 2 mg via INTRAVENOUS
  Filled 2016-11-09: qty 0.2

## 2016-11-09 MED ORDER — CHLORHEXIDINE GLUCONATE CLOTH 2 % EX PADS
6.0000 | MEDICATED_PAD | Freq: Every day | CUTANEOUS | Status: AC
  Administered 2016-11-09 – 2016-11-13 (×5): 6 via TOPICAL

## 2016-11-09 NOTE — Progress Notes (Signed)
During report patient began complaining of shortness of breath and new onset rattling when breathing. On auscultation expiratory wheezing and crackles could be heard bilaterally. PRN albuterol treatment given by RN's at bedside. Patient and family stated that patient's breathing was not rattling prior to arriving on the unit. Respiratory called to come and assess patient. Will continue to monitor and treat per MD orders.

## 2016-11-09 NOTE — Progress Notes (Signed)
Per Maddie RN, lab called to report positive MRSA PCR. Contact precautions initiated. On call notified

## 2016-11-09 NOTE — Progress Notes (Signed)
aerogen adapter set up

## 2016-11-09 NOTE — NC FL2 (Signed)
[place] LEVEL OF CARE SCREENING TOOL     IDENTIFICATION  Patient Name: Kurt Thomas Birthdate: August 26, 1918 Sex: male Admission Date (Current Location): 11/08/2016  [facility] and [place] Number:  Herbalist and Address:  [facility]. [facility], [place] [address] [place], [place] [address]      Provider Number: [id number]  Attending Physician Name and Address:  Janece Canterbury, MD  Relative Name and Phone Number:       Current Level of Care: Hospital Recommended Level of Care: [place] Prior Approval Number:    Date Approved/Denied: 11/09/16 PASRR Number: [id number]  Discharge Plan: SNF    Current Diagnoses: Patient Active Problem List   Diagnosis Date Noted  . Sepsis ([place]) 11/09/2016  . Acute respiratory failure ([place]) 11/09/2016  . Hyperglycemia 11/09/2016  . Renal mass 11/09/2016  . Dementia 11/09/2016  . PNA (pneumonia) 07/25/2016  . HCAP (healthcare-associated pneumonia) 07/24/2016  . AKI (acute kidney injury) ([place]) 07/24/2016  . Diarrhea 07/24/2016  . Normocytic anemia 07/24/2016  . Subtherapeutic international normalized ratio (INR) 07/24/2016  . Pressure injury of skin 06/08/2016  . Chronic anticoagulation 06/05/2016  . BPH (benign prostatic hyperplasia) 06/05/2016  . Fractured hip, right, closed, initial encounter ([place]) 06/04/2016  . Protein-calorie malnutrition, severe ([place]) 09/05/2014  . PVC's (premature ventricular contractions)   . CKD (chronic kidney disease) stage 3, GFR 30-59 ml/min 09/03/2014  . Acute encephalopathy 09/03/2014  . Vomiting 09/03/2014  . Difficulty urinating 09/03/2014  . CAD (coronary artery disease)   . Essential hypertension 06/22/2014  . Atrial fibrillation ([place]) 06/21/2014  . Hip fracture ([place]) 06/20/2014  . Renal insufficiency 06/20/2014  . Closed left hip fracture ([place])   . Lumbar stenosis 05/07/2014  . Low back pain 03/31/2014  . Gait difficulty 03/31/2014  . High blood  pressure     Orientation RESPIRATION BLADDER Height & Weight     Self  O2 (Nasal Cannula 2L) External catheter, Incontinent Weight: 137 lb 14.4 oz (62.6 kg) Height:  5\' 5"  (165.1 cm)  BEHAVIORAL SYMPTOMS/MOOD NEUROLOGICAL BOWEL NUTRITION STATUS      Continent Diet (See DC summary)  AMBULATORY STATUS COMMUNICATION OF NEEDS Skin    (Wheelchair bound) Verbally                         Personal Care Assistance Level of Assistance  Bathing, Dressing, Feeding Bathing Assistance: Maximum assistance Feeding assistance: Maximum assistance Dressing Assistance: Maximum assistance     Functional Limitations Info  Sight, Hearing, Speech Sight Info: Adequate Hearing Info: Adequate Speech Info: Adequate    SPECIAL CARE FACTORS FREQUENCY                       Contractures      Additional Factors Info  Code Status, Allergies, Psychotropic Code Status Info: DNR Allergies Info: NKA Psychotropic Info: Remron          Current Medications (11/09/2016):  This is the current hospital active medication list Current Facility-Administered Medications  Medication Dose Route Frequency Provider Last Rate Last Dose  . 0.9 %  sodium chloride infusion   Intravenous Continuous Karmen Bongo, MD 75 mL/hr at 11/08/16 2359    . albuterol (PROVENTIL) (2.5 MG/3ML) 0.083% nebulizer solution 2.5 mg  2.5 mg Nebulization Q2H PRN Karmen Bongo, MD      . amLODipine (NORVASC) tablet 10 mg  10 mg Oral Daily Karmen Bongo, MD   10 mg at  11/09/16 0925  . brimonidine (ALPHAGAN) 0.2 % ophthalmic solution 1 drop  1 drop Both Eyes BID Karmen Bongo, MD   1 drop at 11/09/16 0924   And  . timolol (TIMOPTIC) 0.5 % ophthalmic solution 1 drop  1 drop Both Eyes BID Karmen Bongo, MD   1 drop at 11/09/16 0930  . carvedilol (COREG) tablet 12.5 mg  12.5 mg Oral Q breakfast Karmen Bongo, MD   12.5 mg at 11/09/16 0926  . ceFEPIme (MAXIPIME) 1 g in dextrose 5 % 50 mL IVPB  1 g Intravenous QHS Karmen Bongo, MD       . Chlorhexidine Gluconate Cloth 2 % PADS 6 each  6 each Topical Q0600 Janece Canterbury, MD   6 each at 11/09/16 0730  . DULoxetine (CYMBALTA) DR capsule 30 mg  30 mg Oral Daily Karmen Bongo, MD   30 mg at 11/09/16 0926  . famotidine (PEPCID) tablet 20 mg  20 mg Oral QHS Karmen Bongo, MD   20 mg at 11/08/16 2359  . feeding supplement (ENSURE ENLIVE) (ENSURE ENLIVE) liquid 237 mL  237 mL Oral BID BM Janece Canterbury, MD   237 mL at 11/09/16 0934  . ferrous sulfate tablet 325 mg  325 mg Oral Q breakfast Karmen Bongo, MD   325 mg at 11/09/16 0926  . ipratropium-albuterol (DUONEB) 0.5-2.5 (3) MG/3ML nebulizer solution 3 mL  3 mL Nebulization Q6H Karmen Bongo, MD   3 mL at 11/09/16 0837  . isosorbide-hydrALAZINE (BIDIL) 20-37.5 MG per tablet 1 tablet  1 tablet Oral BID Karmen Bongo, MD   1 tablet at 11/09/16 [phone]  . latanoprost (XALATAN) 0.005 % ophthalmic solution 1 drop  1 drop Both Eyes QHS Karmen Bongo, MD   1 drop at 11/08/16 2355  . mirabegron ER (MYRBETRIQ) tablet 25 mg  25 mg Oral Q breakfast Karmen Bongo, MD   25 mg at 11/09/16 0925  . mirtazapine (REMERON) tablet 15 mg  15 mg Oral QHS Karmen Bongo, MD   15 mg at 11/08/16 2356  . mupirocin ointment (BACTROBAN) 2 % 1 application  1 application Nasal BID Janece Canterbury, MD   1 application at 41/42/39 [phone]  . polyethylene glycol (MIRALAX / GLYCOLAX) packet 17 g  17 g Oral Daily PRN Karmen Bongo, MD      . tamsulosin [facility]) capsule 0.4 mg  0.4 mg Oral QHS Karmen Bongo, MD   0.4 mg at 11/08/16 2359  . vancomycin (VANCOCIN) IVPB 750 mg/150 ml premix  750 mg Intravenous Q24H Sharlett Iles, MD      . zinc oxide (BALMEX) 23.3 % cream 1 application  1 application Topical Daily Karmen Bongo, MD   1 application at 43/56/86 1683     Discharge Medications: Please see discharge summary for a list of discharge medications.  Relevant Imaging Results:  Relevant Lab Results:   Additional Information [id number];  Respiratory Therapy/Cardiac Therapy  Normajean Baxter, LCSW

## 2016-11-09 NOTE — Progress Notes (Signed)
Pt is tolerating BiPAP well at this time no distress noted.

## 2016-11-09 NOTE — Progress Notes (Signed)
Transferred to [facility] by bed, stable, report given to RN, belongings with pt.

## 2016-11-09 NOTE — Progress Notes (Signed)
Pt has been titrated to a [place] 2L. As of now tolerating it well. Pt is a mouth breather no distress or complications noted. SATs stable

## 2016-11-09 NOTE — Progress Notes (Signed)
CRITICAL VALUE ALERT  Critical value received:  INR-8.30  Date of notification:  11/09/16  Time of notification:  0420  Critical value read back:Yes.    Nurse who received alert: Cipriano Mile  MD notified (1st page):  Baltazar Najjar NP  Time of first page:  0450  MD notified (2nd page):  Time of second page:  Responding MD:    Time MD responded:

## 2016-11-09 NOTE — Progress Notes (Signed)
PROGRESS NOTE  Kurt Thomas  KZL:[id number] DOB: Jun 22, 1919 DOA: 11/08/2016 PCP: Melinda Crutch, MD  Brief Narrative:   Kurt Thomas is a 83 y.o. male with history of remote CVA, remote prostate CA, HTN, afib on Coumadin with difficulty maintaining appropriate INR, CAD, and Alzheimer's dementia presented from [facility] due to hypoxia, and respiratory distress.  He is wheelchair bound since the 2nd hip fracture in Nov/Dec of last year.  He had a CT the day prior to admission which confirmed a slow growing RCC.  In the ER, he was in respiratory distress and placed on bipap.  CXR was concerning for pneumonia so he was started on antibiotics for HCAP.  He was transitioned off bipap the morning of 5/3.  Also, his INR was 7.6 on admission and rose today so he was given a dose of vitamin K.    Assessment & Plan:   Principal Problem:   Acute respiratory failure (HCC) Active Problems:   Atrial fibrillation (HCC)   CKD (chronic kidney disease) stage 3, GFR 30-59 ml/min   Chronic anticoagulation   HCAP (healthcare-associated pneumonia)   Sepsis ([place])   Hyperglycemia   Renal mass   Dementia  Acute respiratory failure with sepsis resulting from HCAP with bilateral infiltrates on CXR.  Bilateral infiltrates and rapid onset of symptoms could suggest aspiration or heart failure.  BNP not very elevated, however.   - Elevated WBC count (12.8), tachycardia, tachypnea with normal lactate  - transitioned off bipap this morning - monitor in SDU during day if an he does not require bipap again, plan to transfer to telemetry bed - MRSA PCR positive - continue Cefepime and Vancomycin (started in the ER). -Sputum cultures -Blood culture pending -  Resume diet -  SLP evaluation for aspiration -  Repeat CXR in AM and if markedly improved compared to admission, higher suspicion for heart-failure, especially given his loud heart murmur.    Afib on Coumadin, CHA2DS2-VASc score appears to be 6, rate controlled - INR  rose so given vitamin K once on 5/3 - He is not actively bleeding  - Troponin 0  CKD, renal mass -BUN 33/Creatinine 1.63/GFP 34, stable -CT confirms a slow-growing renal cell carcinoma -The patient gets very upset with the word "cancer" so please avoid saying it in front of him if possible -With this current cancer, he appears to be more likely to die with it than from it based on its slow rate of growth   Hyperglycemia, likely stress response, improved  Dementia -Family reports overall good quality of life, which is their goal for him at this time -He enjoys visiting with friends and is quite social, despite his inability to walk -His family does not wish to escalate his care further at this time  CAD, chest pain free -  No aspirin due to elevated INR -  Continue bidil, carvedilol  Hypertension, blood pressure low normal -  Continue norvasc  Iron deficiency anemia, chronic, hemoglobin trending down slightly but may be hemodilutional -  Continue iron supplementation  DVT prophylaxis: Supratherapeutic INR Code Status: DNR - confirmed with family.  No escalation of care - no transfer to ICU, no pressors, no CVL. Family Communication: Daughter by phone today.  Questions answered.  Labs and plan for tomorrow reviewed.   Disposition Plan:   Transfer to telemetry  Consultants:   none  Procedures:  bipap   Antimicrobials:  Anti-infectives    Start     Dose/Rate Route Frequency Ordered Stop  11/09/16 2200  ceFEPIme (MAXIPIME) 1 g in dextrose 5 % 50 mL IVPB    Comments:  Cefepime 1 g IV q24h for CrCl< 30 mL/min   1 g 100 mL/hr over 30 Minutes Intravenous Daily at bedtime 11/08/16 2253 12-01-16 2159   11/09/16 2000  vancomycin (VANCOCIN) IVPB 750 mg/150 ml premix     750 mg 150 mL/hr over 60 Minutes Intravenous Every 24 hours 11/08/16 1955     11/08/16 1945  ceFEPIme (MAXIPIME) 1 g in dextrose 5 % 50 mL IVPB     1 g 100 mL/hr over 30 Minutes Intravenous  Once 11/08/16  1933 11/08/16 2258   11/08/16 1945  vancomycin (VANCOCIN) 1,250 mg in sodium chloride 0.9 % 250 mL IVPB     1,250 mg 166.7 mL/hr over 90 Minutes Intravenous  Once 11/08/16 1941 11/09/16 0027       Subjective:  Denies chest pain, SOB, nausea, abdominal pain.    Objective: Vitals:   11/09/16 0839 11/09/16 1256 11/09/16 1300 11/09/16 1631  BP: (!) 112/94  (!) 104/55 122/71  Pulse: 91 77 76 85  Resp: (!) 21 (!) 21 (!) 23 19  Temp: 98.2 F (36.8 C) 97.8 F (36.6 C)  97.7 F (36.5 C)  TempSrc: Axillary Axillary  Oral  SpO2: 96% 95% 95% 92%  Weight:      Height:        Intake/Output Summary (Last 24 hours) at 11/09/16 1650 Last data filed at 11/09/16 1600  Gross per 24 hour  Intake          1980.75 ml  Output                0 ml  Net          1980.75 ml   Filed Weights   11/08/16 1909 11/08/16 2254  Weight: 61.2 kg (134 lb 14.7 oz) 62.6 kg (137 lb 14.4 oz)    Examination:  General exam:  Adult male, thin.  No acute distress.  HEENT:  NCAT, MMM Respiratory system:   Diminished at the bilateral bases with some rales at the bilateral bases, faint wheeze, no rhonchi Cardiovascular system:  IRRR and mildly tachycardic at time of exam.  3/6 systolic murmur throughout precordium.  Warm extremities Gastrointestinal system: Normal active bowel sounds, scaphoid, soft, nondistended, nontender. MSK:  Normal tone and bulk, no lower extremity edema Neuro:  Grossly moves all extremities    Data Reviewed: I have personally reviewed following labs and imaging studies  CBC:  Recent Labs Lab 11/08/16 1845 11/09/16 0346  WBC 12.8* 14.5*  NEUTROABS 11.4* 12.7*  HGB 9.5* 8.0*  HCT 30.5* 25.1*  MCV 94.1 93.7  PLT 262 382   Basic Metabolic Panel:  Recent Labs Lab 11/08/16 1845 11/09/16 0346  NA 137 136  K 4.6 4.8  CL 105 106  CO2 22 21*  GLUCOSE 120* 124*  BUN 33* 34*  CREATININE 1.63* 1.67*  CALCIUM 9.2 8.7*   GFR: Estimated Creatinine Clearance: 22.5 mL/min (A)  (by C-G formula based on SCr of 1.67 mg/dL (H)). Liver Function Tests:  Recent Labs Lab 11/08/16 1845  AST 17  ALT 14*  ALKPHOS 100  BILITOT 0.5  PROT 6.2*  ALBUMIN 3.5   No results for input(s): LIPASE, AMYLASE in the last 168 hours. No results for input(s): AMMONIA in the last 168 hours. Coagulation Profile:  Recent Labs Lab 11/08/16 1845 11/09/16 0346  INR 7.61* 8.30*   Cardiac Enzymes: No results for input(s):  CKTOTAL, CKMB, CKMBINDEX, TROPONINI in the last 168 hours. BNP (last 3 results) No results for input(s): PROBNP in the last 8760 hours. HbA1C: No results for input(s): HGBA1C in the last 72 hours. CBG: No results for input(s): GLUCAP in the last 168 hours. Lipid Profile: No results for input(s): CHOL, HDL, LDLCALC, TRIG, CHOLHDL, LDLDIRECT in the last 72 hours. Thyroid Function Tests: No results for input(s): TSH, T4TOTAL, FREET4, T3FREE, THYROIDAB in the last 72 hours. Anemia Panel: No results for input(s): VITAMINB12, FOLATE, FERRITIN, TIBC, IRON, RETICCTPCT in the last 72 hours. Urine analysis:    Component Value Date/Time   COLORURINE YELLOW 07/24/2016 1554   APPEARANCEUR HAZY (A) 07/24/2016 1554   LABSPEC 1.015 07/24/2016 1554   PHURINE 5.0 07/24/2016 1554   GLUCOSEU NEGATIVE 07/24/2016 1554   HGBUR MODERATE (A) 07/24/2016 1554   BILIRUBINUR NEGATIVE 07/24/2016 1554   KETONESUR NEGATIVE 07/24/2016 1554   PROTEINUR NEGATIVE 07/24/2016 1554   UROBILINOGEN 0.2 09/03/2014 2025   NITRITE NEGATIVE 07/24/2016 1554   LEUKOCYTESUR LARGE (A) 07/24/2016 1554   Sepsis Labs: @LABRCNTIP (procalcitonin:4,lacticidven:4)  ) Recent Results (from the past 240 hour(s))  MRSA PCR Screening     Status: Abnormal   Collection Time: 11/08/16 10:53 PM  Result Value Ref Range Status   MRSA by PCR POSITIVE (A) NEGATIVE Final    Comment:        The GeneXpert MRSA Assay (FDA approved for NASAL specimens only), is one component of a comprehensive MRSA  colonization surveillance program. It is not intended to diagnose MRSA infection nor to guide or monitor treatment for MRSA infections. RESULT CALLED TO, READ BACK BY AND VERIFIED WITH: S.CORIEA,RN AT 0331       Radiology Studies: Dg Chest Port 1 View  Result Date: 11/08/2016 CLINICAL DATA:  Shortness of Breath EXAM: PORTABLE CHEST 1 VIEW COMPARISON:  07/24/2016 FINDINGS: Cardiac shadow is mildly enlarged but stable. Postsurgical changes are again seen the lungs are well aerated bilaterally. Diffuse infiltrate is noted throughout the right lung and to a lesser degree in the left lung base. No bony abnormality is noted. IMPRESSION: Bilateral infiltrates right greater than left. Electronically Signed   By: Inez Catalina M.D.   On: 11/08/2016 19:25     Scheduled Meds: . amLODipine  10 mg Oral Daily  . brimonidine  1 drop Both Eyes BID   And  . timolol  1 drop Both Eyes BID  . carvedilol  12.5 mg Oral Q breakfast  . Chlorhexidine Gluconate Cloth  6 each Topical Q0600  . DULoxetine  30 mg Oral Daily  . famotidine  20 mg Oral QHS  . feeding supplement (ENSURE ENLIVE)  237 mL Oral BID BM  . ferrous sulfate  325 mg Oral Q breakfast  . ipratropium-albuterol  3 mL Nebulization Q6H  . isosorbide-hydrALAZINE  1 tablet Oral BID  . latanoprost  1 drop Both Eyes QHS  . mirabegron ER  25 mg Oral Q breakfast  . mirtazapine  15 mg Oral QHS  . mupirocin ointment  1 application Nasal BID  . tamsulosin  0.4 mg Oral QHS  . zinc oxide  1 application Topical Daily   Continuous Infusions: . sodium chloride 75 mL/hr at 11/08/16 2359  . ceFEPime (MAXIPIME) IV    . vancomycin       LOS: 1 day    Time spent: 30 min    Janece Canterbury, MD [facility] Pager [phone]  If 7PM-7AM, please contact night-coverage [email or web address] Password [email or web address] 11/09/2016, 4:50 PM

## 2016-11-09 NOTE — Care Management Note (Signed)
Case Management Note  Patient Details  Name: Kurt Thomas MRN: [id number] Date of Birth: 06-Jun-1919  Subjective/Objective:     Pt admitted with RF in addition to elevated INR            Action/Plan:  PTA from [place] - wheelchair bound.  CSW consulted.     Expected Discharge Date:                  Expected Discharge Plan:  Assisted Living / Rest Home (From ALF)  In-House Referral:  Clinical Social Work  Discharge planning Services  CM Consult  Post Acute Care Choice:    Choice offered to:     DME Arranged:    DME Agency:     HH Arranged:    [place] Agency:     Status of Service:     If discussed at [employer], dates discussed:    Additional Comments:  Maryclare Labrador, RN 11/09/2016, 9:04 AM

## 2016-11-10 ENCOUNTER — Inpatient Hospital Stay (HOSPITAL_COMMUNITY): Payer: Medicare Other

## 2016-11-10 LAB — CBC
HEMATOCRIT: 25.1 % — AB (ref 39.0–52.0)
HEMOGLOBIN: 8 g/dL — AB (ref 13.0–17.0)
MCH: 30.4 pg (ref 26.0–34.0)
MCHC: 31.9 g/dL (ref 30.0–36.0)
MCV: 95.4 fL (ref 78.0–100.0)
Platelets: 172 10*3/uL (ref 150–400)
RBC: 2.63 MIL/uL — ABNORMAL LOW (ref 4.22–5.81)
RDW: 15.3 % (ref 11.5–15.5)
WBC: 7 10*3/uL (ref 4.0–10.5)

## 2016-11-10 LAB — BASIC METABOLIC PANEL
ANION GAP: 10 (ref 5–15)
BUN: 37 mg/dL — AB (ref 6–20)
CHLORIDE: 106 mmol/L (ref 101–111)
CO2: 22 mmol/L (ref 22–32)
Calcium: 8.7 mg/dL — ABNORMAL LOW (ref 8.9–10.3)
Creatinine, Ser: 1.64 mg/dL — ABNORMAL HIGH (ref 0.61–1.24)
GFR calc Af Amer: 39 mL/min — ABNORMAL LOW (ref >60)
GFR calc non Af Amer: 34 mL/min — ABNORMAL LOW (ref >60)
GLUCOSE: 100 mg/dL — AB (ref 65–99)
Potassium: 4.5 mmol/L (ref 3.5–5.1)
Sodium: 138 mmol/L (ref 135–145)

## 2016-11-10 LAB — PROTIME-INR
INR: 2.99
Prothrombin Time: 31.7 seconds — ABNORMAL HIGH (ref 11.4–15.2)

## 2016-11-10 MED ORDER — IPRATROPIUM-ALBUTEROL 0.5-2.5 (3) MG/3ML IN SOLN
3.0000 mL | Freq: Three times a day (TID) | RESPIRATORY_TRACT | Status: DC
  Administered 2016-11-11 – 2016-11-13 (×7): 3 mL via RESPIRATORY_TRACT
  Filled 2016-11-10 (×7): qty 3

## 2016-11-10 MED ORDER — SODIUM CHLORIDE 0.9 % IV SOLN
1.5000 g | Freq: Two times a day (BID) | INTRAVENOUS | Status: DC
  Administered 2016-11-10 – 2016-11-13 (×6): 1.5 g via INTRAVENOUS
  Filled 2016-11-10 (×8): qty 1.5

## 2016-11-10 MED ORDER — RESOURCE THICKENUP CLEAR PO POWD
ORAL | Status: DC | PRN
  Filled 2016-11-10: qty 125

## 2016-11-10 MED ORDER — WARFARIN - PHARMACIST DOSING INPATIENT
Freq: Every day | Status: DC
  Administered 2016-11-10 – 2016-11-12 (×3)

## 2016-11-10 MED ORDER — WARFARIN SODIUM 1 MG PO TABS
1.0000 mg | ORAL_TABLET | Freq: Once | ORAL | Status: AC
  Administered 2016-11-10: 1 mg via ORAL
  Filled 2016-11-10: qty 1

## 2016-11-10 MED ORDER — GUAIFENESIN ER 600 MG PO TB12
600.0000 mg | ORAL_TABLET | Freq: Two times a day (BID) | ORAL | Status: DC
  Administered 2016-11-10 – 2016-11-13 (×7): 600 mg via ORAL
  Filled 2016-11-10 (×7): qty 1

## 2016-11-10 NOTE — Progress Notes (Addendum)
PROGRESS NOTE  Kurt Thomas  FKC:[id number] DOB: Nov 27, 1918 DOA: 11/08/2016 PCP: Melinda Crutch, MD  Brief Narrative:   Kurt Thomas is a 83 y.o. male with history of remote CVA, remote prostate CA, HTN, afib on Coumadin with difficulty maintaining appropriate INR, CAD, and Alzheimer's dementia presented from [facility] due to hypoxia, and respiratory distress.  He is wheelchair bound since the 2nd hip fracture in Nov/Dec of last year.  He had a CT the day prior to admission which confirmed a slow growing RCC.  In the ER, he was in respiratory distress and placed on bipap.  CXR was concerning for pneumonia so he was started on antibiotics for HCAP.  He was transitioned off bipap the morning of 5/3 and transitioned out of SDU the same day. Also, his INR was 7.6 on admission and rose so he was given one dose of vitamin K and his INR has trended down.  Repeat CXR this morning demonstrates improved opacities.  Case discussed with daughter who reports that patient has been aspirating and on aspiration precautions at SNF.  His symptoms started after drinking cola through a straw.  He is normally on thickened liquids.  Changing antibiotics to unasyn for likely aspiration.    Assessment & Plan:   Principal Problem:   Acute respiratory failure (HCC) Active Problems:   Atrial fibrillation (HCC)   CKD (chronic kidney disease) stage 3, GFR 30-59 ml/min   Chronic anticoagulation   HCAP (healthcare-associated pneumonia)   Sepsis ([place])   Hyperglycemia   Renal mass   Dementia  Acute respiratory failure with sepsis resulting from aspiration pneumonia (vs HCAP) with right greater than left infiltrates on CXR.   - Leukocytosis resolved  - d/c vancomycin - d/c Cefepime and start unasyn -Sputum cultures -Blood culture NGTD -  Aspiration precautions -  Change to dysphagia diet with nectar thick liquids -  SLP evaluation -  CXR this morning interpreted by myself -  Continue to try to transition of oxygen/nasal  canula  Weak cough, poor prognostic sign -  Flutter valve, incentive spirometry -  Guaifenesin  Afib on Coumadin, CHA2DS2-VASc score appears to be 6, rate controlled - INR rose so given vitamin K once on 5/3 - NO actively bleeding  - Troponin 0 - resume warfarin per pharmacy  CKD stage 3 and slow progressive RCC, creatinine stable at 1.6 -The patient gets very upset with the word "cancer" so please avoid saying it in front of him if possible -With this current cancer, he appears to be more likely to die with it than from it based on its slow rate of growth   Hyperglycemia, likely stress response, improved  Dementia -Family reports overall good quality of life, which is their goal for him at this time -He enjoys visiting with friends and is quite social, despite his inability to walk -His family does not wish to escalate his care further at this time  CAD, chest pain free -  No aspirin due to elevated INR -  Continue bidil, carvedilol  Hypertension, blood pressure low normal -  Continue norvasc  Iron deficiency anemia, chronic, hemoglobin trending down slightly but may be hemodilutional -  Continue iron supplementation  DVT prophylaxis: therapeutic INR, start SCDs Code Status: DNR - confirmed with family.  No escalation of care - no transfer to ICU, no pressors, no CVL. Family Communication: Daughter by phone on 5/4.  Questions answered.  Labs and plan for tomorrow reviewed.   Disposition Plan:   Faythe Ghee  to d/c telemetry, anticipate back to [facility] in 2 days  Consultants:   none  Procedures:  bipap   Antimicrobials:  Anti-infectives    Start     Dose/Rate Route Frequency Ordered Stop   11/09/16 2200  ceFEPIme (MAXIPIME) 1 g in dextrose 5 % 50 mL IVPB    Comments:  Cefepime 1 g IV q24h for CrCl< 30 mL/min   1 g 100 mL/hr over 30 Minutes Intravenous Daily at bedtime 11/08/16 2253 Dec 02, 2016 2159   11/09/16 2000  vancomycin (VANCOCIN) IVPB 750 mg/150 ml premix      750 mg 150 mL/hr over 60 Minutes Intravenous Every 24 hours 11/08/16 1955     11/08/16 1945  ceFEPIme (MAXIPIME) 1 g in dextrose 5 % 50 mL IVPB     1 g 100 mL/hr over 30 Minutes Intravenous  Once 11/08/16 1933 11/08/16 2258   11/08/16 1945  vancomycin (VANCOCIN) 1,250 mg in sodium chloride 0.9 % 250 mL IVPB     1,250 mg 166.7 mL/hr over 90 Minutes Intravenous  Once 11/08/16 1941 11/09/16 0027       Subjective:  Denies chest pain, SOB, nausea, abdominal pain.  Was very rhonchorous and SOB after transferring last night and had to have an urgent nebulizer treatment which helps.  Has weak cough but still able to clear some white secretions.    Objective: Vitals:   11/10/16 0135 11/10/16 0515 11/10/16 0845 11/10/16 0922  BP:  (!) 144/81  (!) 141/77  Pulse:  90  88  Resp:  18    Temp:      TempSrc:      SpO2: 95% 93% 95%   Weight:      Height:        Intake/Output Summary (Last 24 hours) at 11/10/16 1157 Last data filed at 11/10/16 0646  Gross per 24 hour  Intake           1827.5 ml  Output              100 ml  Net           1727.5 ml   Filed Weights   11/08/16 1909 11/08/16 2254  Weight: 61.2 kg (134 lb 14.7 oz) 62.6 kg (137 lb 14.4 oz)    Examination:  General exam:  Adult male, thin.  Mild tachypnea and mild SCM retractions at rest.  Forced exhalations.  Obvious rhonchi  HEENT:  NCAT, MMM Respiratory system:   Diminished at the bilateral bases with some rales at the bilateral bases, wheeze, copious rhonchi Cardiovascular system:  IRRR.  3/6 systolic murmur throughout precordium.  Warm extremities Gastrointestinal system: Normal active bowel sounds, scaphoid, soft, nondistended, nontender. MSK:  Normal tone and bulk, no lower extremity edema Neuro:  Grossly moves all extremities    Data Reviewed: I have personally reviewed following labs and imaging studies  CBC:  Recent Labs Lab 11/08/16 1845 11/09/16 0346 11/10/16 0432  WBC 12.8* 14.5* 7.0  NEUTROABS  11.4* 12.7*  --   HGB 9.5* 8.0* 8.0*  HCT 30.5* 25.1* 25.1*  MCV 94.1 93.7 95.4  PLT 262 185 175   Basic Metabolic Panel:  Recent Labs Lab 11/08/16 1845 11/09/16 0346 11/10/16 0432  NA 137 136 138  K 4.6 4.8 4.5  CL 105 106 106  CO2 22 21* 22  GLUCOSE 120* 124* 100*  BUN 33* 34* 37*  CREATININE 1.63* 1.67* 1.64*  CALCIUM 9.2 8.7* 8.7*   GFR: Estimated Creatinine Clearance: 22.9 mL/min (  A) (by C-G formula based on SCr of 1.64 mg/dL (H)). Liver Function Tests:  Recent Labs Lab 11/08/16 1845  AST 17  ALT 14*  ALKPHOS 100  BILITOT 0.5  PROT 6.2*  ALBUMIN 3.5   No results for input(s): LIPASE, AMYLASE in the last 168 hours. No results for input(s): AMMONIA in the last 168 hours. Coagulation Profile:  Recent Labs Lab 11/08/16 1845 11/09/16 0346 11/10/16 0432  INR 7.61* 8.30* 2.99   Cardiac Enzymes: No results for input(s): CKTOTAL, CKMB, CKMBINDEX, TROPONINI in the last 168 hours. BNP (last 3 results) No results for input(s): PROBNP in the last 8760 hours. HbA1C: No results for input(s): HGBA1C in the last 72 hours. CBG: No results for input(s): GLUCAP in the last 168 hours. Lipid Profile: No results for input(s): CHOL, HDL, LDLCALC, TRIG, CHOLHDL, LDLDIRECT in the last 72 hours. Thyroid Function Tests: No results for input(s): TSH, T4TOTAL, FREET4, T3FREE, THYROIDAB in the last 72 hours. Anemia Panel: No results for input(s): VITAMINB12, FOLATE, FERRITIN, TIBC, IRON, RETICCTPCT in the last 72 hours. Urine analysis:    Component Value Date/Time   COLORURINE YELLOW 07/24/2016 1554   APPEARANCEUR HAZY (A) 07/24/2016 1554   LABSPEC 1.015 07/24/2016 1554   PHURINE 5.0 07/24/2016 1554   GLUCOSEU NEGATIVE 07/24/2016 1554   HGBUR MODERATE (A) 07/24/2016 1554   BILIRUBINUR NEGATIVE 07/24/2016 1554   KETONESUR NEGATIVE 07/24/2016 1554   PROTEINUR NEGATIVE 07/24/2016 1554   UROBILINOGEN 0.2 09/03/2014 2025   NITRITE NEGATIVE 07/24/2016 1554   LEUKOCYTESUR  LARGE (A) 07/24/2016 1554   Sepsis Labs: @LABRCNTIP (procalcitonin:4,lacticidven:4)  ) Recent Results (from the past 240 hour(s))  MRSA PCR Screening     Status: Abnormal   Collection Time: 11/08/16 10:53 PM  Result Value Ref Range Status   MRSA by PCR POSITIVE (A) NEGATIVE Final    Comment:        The GeneXpert MRSA Assay (FDA approved for NASAL specimens only), is one component of a comprehensive MRSA colonization surveillance program. It is not intended to diagnose MRSA infection nor to guide or monitor treatment for MRSA infections. RESULT CALLED TO, READ BACK BY AND VERIFIED WITH: S.CORIEA,RN AT 0331       Radiology Studies: Dg Chest Port 1 View  Result Date: 11/10/2016 CLINICAL DATA:  Shortness of breath . EXAM: PORTABLE CHEST 1 VIEW COMPARISON:  11/08/2016 . FINDINGS: Prior CABG. Stable cardiomegaly. Persistent but improving bilateral pulmonary interstitial infiltrates/edema. Findings consistent with improving CHF. Small bilateral pleural effusions. No pneumothorax . Barium is noted in the colon. IMPRESSION: 1.  Prior CABG. 2. Cardiomegaly with bilateral pulmonary interstitial prominence and bilateral pleural effusions consistent with CHF. Pulmonary interstitial edema has improved from prior exam. Electronically Signed   By: Marcello Moores  Register   On: 11/10/2016 07:55   Dg Chest Port 1 View  Result Date: 11/08/2016 CLINICAL DATA:  Shortness of Breath EXAM: PORTABLE CHEST 1 VIEW COMPARISON:  07/24/2016 FINDINGS: Cardiac shadow is mildly enlarged but stable. Postsurgical changes are again seen the lungs are well aerated bilaterally. Diffuse infiltrate is noted throughout the right lung and to a lesser degree in the left lung base. No bony abnormality is noted. IMPRESSION: Bilateral infiltrates right greater than left. Electronically Signed   By: Inez Catalina M.D.   On: 11/08/2016 19:25     Scheduled Meds: . amLODipine  10 mg Oral Daily  . brimonidine  1 drop Both Eyes BID   And   . timolol  1 drop Both Eyes BID  .  carvedilol  12.5 mg Oral Q breakfast  . Chlorhexidine Gluconate Cloth  6 each Topical Q0600  . DULoxetine  30 mg Oral Daily  . famotidine  20 mg Oral QHS  . feeding supplement (ENSURE ENLIVE)  237 mL Oral BID BM  . ferrous sulfate  325 mg Oral Q breakfast  . guaiFENesin  600 mg Oral BID  . ipratropium-albuterol  3 mL Nebulization Q6H  . isosorbide-hydrALAZINE  1 tablet Oral BID  . latanoprost  1 drop Both Eyes QHS  . mirabegron ER  25 mg Oral Q breakfast  . mirtazapine  15 mg Oral QHS  . mupirocin ointment  1 application Nasal BID  . tamsulosin  0.4 mg Oral QHS  . zinc oxide  1 application Topical Daily   Continuous Infusions: . ceFEPime (MAXIPIME) IV Stopped (11/10/16 0028)  . vancomycin Stopped (11/09/16 2333)     LOS: 2 days    Time spent: 30 min    Janece Canterbury, MD [facility] Pager [phone]  If 7PM-7AM, please contact night-coverage [email or web address] Password [email or web address] 11/10/2016, 11:57 AM

## 2016-11-10 NOTE — Progress Notes (Signed)
Initial Nutrition Assessment  DOCUMENTATION CODES:   Severe malnutrition in context of chronic illness  INTERVENTION:   Provide Ensure Enlive po BID (thickened to appropriate consistency), each supplement provides 350 kcal and 20 grams of protein.  Encourage adequate PO intake.   NUTRITION DIAGNOSIS:   Malnutrition (severe) related to chronic illness as evidenced by severe depletion of body fat, severe depletion of muscle mass.  GOAL:   Patient will meet greater than or equal to 90% of their needs  MONITOR:   PO intake, Supplement acceptance, Labs, Weight trends, Skin, I & O's  REASON FOR ASSESSMENT:   Consult Assessment of nutrition requirement/status  ASSESSMENT:   83 y.o. male with history of remote CVA, remote prostate CA, HTN, afib on Coumadin with difficulty maintaining appropriate INR, CAD, and Alzheimer's dementia presented from [facility] due to hypoxia, and respiratory distress.  Meal completion has been 20-75%. Pt reports having a good appetite currently and PTA with usual consumption of at least 2-3 meals a day. No family at bedside. Usual body weight unknown to patient. Pt currently has Ensure ordered and has been consuming them. RD to continue with current orders.   Nutrition-Focused physical exam completed. Findings are severe fat depletion, severe muscle depletion, and no edema.   Labs and medications reviewed.   Diet Order:  DIET DYS 3 Room service appropriate? Yes; Fluid consistency: Nectar Thick  Skin:  Reviewed, no issues  Last BM:  5/3  Height:   Ht Readings from Last 1 Encounters:  11/08/16 5\' 5"  (1.651 m)    Weight:   Wt Readings from Last 1 Encounters:  11/08/16 137 lb 14.4 oz (62.6 kg)    Ideal Body Weight:  61.8 kg  BMI:  Body mass index is 22.95 kg/m.  Estimated Nutritional Needs:   Kcal:  1500-1700  Protein:  65-75 grams  Fluid:  >/= 1.5 L/day  EDUCATION NEEDS:   No education needs identified at this  time  Corrin Parker, MS, RD, LDN Pager # [phone] After hours/ weekend pager # [phone]

## 2016-11-10 NOTE — Progress Notes (Signed)
ANTICOAGULATION CONSULT NOTE   Pharmacy Consult for warfarin Indication: atrial fibrillation  No Known Allergies  Patient Measurements: Height: 5\' 5"  (165.1 cm) Weight: 137 lb 14.4 oz (62.6 kg) IBW/kg (Calculated) : 61.5  Vital Signs: BP: 141/77 (05/04 0922) Pulse Rate: 88 (05/04 0922)  Labs:  Recent Labs  11/08/16 1845 11/09/16 0346 11/10/16 0432  HGB 9.5* 8.0* 8.0*  HCT 30.5* 25.1* 25.1*  PLT 262 185 172  LABPROT 66.9* 71.7* 31.7*  INR 7.61* 8.30* 2.99  CREATININE 1.63* 1.67* 1.64*    Estimated Creatinine Clearance: 22.9 mL/min (A) (by C-G formula based on SCr of 1.64 mg/dL (H)).   Medical History: Past Medical History:  Diagnosis Date  . Alzheimer's disease, focal onset   . CAD (coronary artery disease)   . Chronic atrial fibrillation ([place])   . Essential hypertension   . Low back pain   . Prostate cancer ([place]) 1980s  . Recurrent major depression ([place])   . Stroke [facility]) 1990s     Assessment: 83 yo male on warfarin prior to admit for history of atrial fibrillation admitted on 5/2 with concern for PNA and supratherapeutic INR.  INR today is 2.99 s/p 2 mg IV vitamin K on 5/3.  Pharmacy asked to resume warfarin on 5/4.   PTA dose:  5mg  daily except 4mg  qMon/Fri per NH MAR,  Goal of Therapy:  INR 2-3 Monitor platelets by anticoagulation protocol: Yes   Plan:  1. Warfarin 1 mg x 1 this evening 2. Daily INR  Vincenza Hews, PharmD, BCPS 11/10/2016, 12:13 PM

## 2016-11-10 NOTE — Progress Notes (Signed)
Pharmacy Antibiotic Note Kurt Thomas is a 83 y.o. male admitted on 11/08/2016. Initially started on Cefepime and vancomycin for coverage of pneumonia but to deescalate to Unasyn for further treatment.   Plan: 1. Begin Unasyn 1.5 grams IV every 12 hours  2. Will follow peripherally    Height: 5\' 5"  (165.1 cm) Weight: 137 lb 14.4 oz (62.6 kg) IBW/kg (Calculated) : 61.5  Temp (24hrs), Avg:98.2 F (36.8 C), Min:97.7 F (36.5 C), Max:98.6 F (37 C)   Recent Labs Lab 11/08/16 1845 11/08/16 1856 11/09/16 0346 11/10/16 0432  WBC 12.8*  --  14.5* 7.0  CREATININE 1.63*  --  1.67* 1.64*  LATICACIDVEN  --  1.43  --   --     Estimated Creatinine Clearance: 22.9 mL/min (A) (by C-G formula based on SCr of 1.64 mg/dL (H)).    No Known Allergies  Antimicrobials this admission: 5/2 Zosyn >> 5/4 5/2 vancomycin >> 5/4  5/4 Unasyn >>   Microbiology results: 5/2 BCx: px 5/2 MRSA PCR: positive  Thank you for allowing pharmacy to be a part of this patient's care.  Vincenza Hews, PharmD, BCPS 11/10/2016, 12:16 PM

## 2016-11-10 NOTE — Consult Note (Signed)
   [facility] CM Inpatient Consult   11/10/2016  [name] Apr 16, 1919 [id number]   Patient evaluated for 3 admissions in the past 6 months.  Chart review per MD notes is the patient is [name] a 83 y.o.malewith history of remote CVA, remote prostate CA, HTN, afib on Coumadin with difficulty maintaining appropriate INR, CAD, and Alzheimer's dementia presented from [facility] due to hypoxia, and respiratory distress.  He is wheelchair bound since the 2nd hip fracture in Nov/Dec of last year.  Patient's Primary care provider is Dr. Lawerance Cruel with [facility]. This office provides post hospital follow up for transition of care. Reviewed notes also indicates that patient is currently being recommended for a skilled facility stay.  No current [facility] Care management needs at this time.  For questions or changes, please contact:  Natividad Brood, RN BSN [facility] Liaison  [phone] business mobile phone Toll free office [phone]

## 2016-11-10 NOTE — Progress Notes (Signed)
Pharmacy Antibiotic Note Kurt Thomas is a 83 y.o. male admitted on 11/08/2016. Currently on day 3 of Cefepime and vancomycin for empiric coverage of pneumonia.   Plan: 1. Continue Cefepime 1 gram IV every 24 hours  2. Continue Vancomycin 750 mg IV every 24 hours  3. If remains on vancomycin will obtain vancomycin trough in next 48-72 hours to evaluate current dosing regimen; goal trough 15-20 4. Narrow abx as feasible     Height: 5\' 5"  (165.1 cm) Weight: 137 lb 14.4 oz (62.6 kg) IBW/kg (Calculated) : 61.5  Temp (24hrs), Avg:98.2 F (36.8 C), Min:97.7 F (36.5 C), Max:98.6 F (37 C)   Recent Labs Lab 11/08/16 1845 11/08/16 1856 11/09/16 0346 11/10/16 0432  WBC 12.8*  --  14.5* 7.0  CREATININE 1.63*  --  1.67* 1.64*  LATICACIDVEN  --  1.43  --   --     Estimated Creatinine Clearance: 22.9 mL/min (A) (by C-G formula based on SCr of 1.64 mg/dL (H)).    No Known Allergies  Antimicrobials this admission: 5/2 Zosyn >>  5/2 vancomycin >>   Microbiology results: 5/2 BCx: px 5/2 MRSA PCR: positive  Thank you for allowing pharmacy to be a part of this patient's care.  Vincenza Hews, PharmD, BCPS 11/10/2016, 9:05 AM

## 2016-11-10 NOTE — Evaluation (Signed)
Physical Therapy Evaluation Patient Details Name: Kurt Thomas MRN: [id number] DOB: [birth year] Today's Date: 11/10/2016   History of Present Illness   83 y.o. male with history of remote CVA, remote prostate CA, HTN, afib on Coumadin with difficulty maintaining appropriate INR, CAD, and Alzheimer's dementia presented from [facility] due to hypoxia, and respiratory distress.  He is wheelchair bound since the 2nd hip fracture, s/p IM nail 06/05/16. Dx of HCAP, sepsis, acute respiratory failure.   Clinical Impression  Pt admitted with above diagnosis. Pt currently with functional limitations due to the deficits listed below (see PT Problem List). Mod assist for bed mobility, Min A to pivot to recliner, per chart pt is WC bound at facility. SaO2 91% on RA with activity.  Pt will benefit from skilled PT to increase their independence and safety with mobility to allow discharge to the venue listed below.       Follow Up Recommendations SNF    Equipment Recommendations  None recommended by PT    Recommendations for Other Services       Precautions / Restrictions Precautions Precautions: Fall Restrictions Weight Bearing Restrictions: No      Mobility  Bed Mobility Overal bed mobility: Needs Assistance Bed Mobility: Supine to Sit     Supine to sit: Mod assist     General bed mobility comments: assist to raise trunk, used pad to pivot hips  Transfers Overall transfer level: Needs assistance Equipment used: None Transfers: Sit to/from [facility] Sit to Stand: Mod assist Stand pivot transfers: Min assist       General transfer comment: Mod A to rise, Min A to pivot to recliner; SaO2 91% on RA with activity  Ambulation/Gait             General Gait Details: WC bound per chart  Stairs            Wheelchair Mobility    Modified Rankin (Stroke Patients Only)       Balance Overall balance assessment: Needs assistance Sitting-balance support:  Feet supported;Bilateral upper extremity supported Sitting balance-Leahy Scale: Fair       Standing balance-Leahy Scale: Poor                               Pertinent Vitals/Pain Pain Assessment: Faces Faces Pain Scale: No hurt    Home Living Family/patient expects to be discharged to:: Assisted living               Home Equipment: Wheelchair - manual Additional Comments: pt resides [facility]    Prior Function           Comments: per chart pt has been WC bound since hip fx in November 2017, pt demented, unable to provide hx;     Hand Dominance   Dominant Hand: Right    Extremity/Trunk Assessment   Upper Extremity Assessment Upper Extremity Assessment: Overall WFL for tasks assessed    Lower Extremity Assessment Lower Extremity Assessment: Overall WFL for tasks assessed (B knee ext +4/5)    Cervical / Trunk Assessment Cervical / Trunk Assessment: Kyphotic  Communication   Communication: HOH  Cognition Arousal/Alertness: Awake/alert Behavior During Therapy: WFL for tasks assessed/performed Overall Cognitive Status: No family/caregiver present to determine baseline cognitive functioning (oriented to self only, not to location, year, nor situation)  General Comments      Exercises     Assessment/Plan    PT Assessment Patient needs continued PT services  PT Problem List Decreased activity tolerance;Decreased balance;Decreased mobility;Cardiopulmonary status limiting activity       PT Treatment Interventions Therapeutic activities;Therapeutic exercise;Patient/family education;Functional mobility training;Balance training    PT Goals (Current goals can be found in the Care Plan section)  Acute Rehab PT Goals PT Goal Formulation: Patient unable to participate in goal setting Time For Goal Achievement: 11/24/16 Potential to Achieve Goals: Fair    Frequency Min 3X/week    Barriers to discharge        Co-evaluation               AM-PAC PT "6 Clicks" Daily Activity  Outcome Measure Difficulty turning over in bed (including adjusting bedclothes, sheets and blankets)?: A Little Difficulty moving from lying on back to sitting on the side of the bed? : A Lot Difficulty sitting down on and standing up from a chair with arms (e.g., wheelchair, bedside commode, etc,.)?: A Lot Help needed moving to and from a bed to chair (including a wheelchair)?: A Lot Help needed walking in hospital room?: A Lot Help needed climbing 3-5 steps with a railing? : Total 6 Click Score: 12    End of Session Equipment Utilized During Treatment: Gait belt;Oxygen Activity Tolerance: Patient tolerated treatment well Patient left: in chair;with call bell/phone within reach;with chair alarm set Nurse Communication: Mobility status PT Visit Diagnosis: Other abnormalities of gait and mobility (R26.89);Difficulty in walking, not elsewhere classified (R26.2)    Time: 4492-0100 PT Time Calculation (min) (ACUTE ONLY): 26 min   Charges:   PT Evaluation $PT Eval Low Complexity: 1 Procedure PT Treatments $Therapeutic Activity: 8-22 mins   PT G Codes:          Philomena Doheny 11/10/2016, 12:20 PM [phone]

## 2016-11-11 LAB — BASIC METABOLIC PANEL
Anion gap: 9 (ref 5–15)
BUN: 41 mg/dL — AB (ref 6–20)
CO2: 21 mmol/L — ABNORMAL LOW (ref 22–32)
CREATININE: 1.54 mg/dL — AB (ref 0.61–1.24)
Calcium: 8.8 mg/dL — ABNORMAL LOW (ref 8.9–10.3)
Chloride: 110 mmol/L (ref 101–111)
GFR calc Af Amer: 42 mL/min — ABNORMAL LOW (ref >60)
GFR, EST NON AFRICAN AMERICAN: 36 mL/min — AB (ref >60)
GLUCOSE: 135 mg/dL — AB (ref 65–99)
POTASSIUM: 4.6 mmol/L (ref 3.5–5.1)
Sodium: 140 mmol/L (ref 135–145)

## 2016-11-11 LAB — CBC
HEMATOCRIT: 24.6 % — AB (ref 39.0–52.0)
Hemoglobin: 7.7 g/dL — ABNORMAL LOW (ref 13.0–17.0)
MCH: 29.8 pg (ref 26.0–34.0)
MCHC: 31.3 g/dL (ref 30.0–36.0)
MCV: 95.3 fL (ref 78.0–100.0)
Platelets: 186 10*3/uL (ref 150–400)
RBC: 2.58 MIL/uL — ABNORMAL LOW (ref 4.22–5.81)
RDW: 15.2 % (ref 11.5–15.5)
WBC: 7.9 10*3/uL (ref 4.0–10.5)

## 2016-11-11 LAB — PROTIME-INR
INR: 3.11
Prothrombin Time: 32.7 seconds — ABNORMAL HIGH (ref 11.4–15.2)

## 2016-11-11 MED ORDER — WARFARIN 0.5 MG HALF TABLET
0.5000 mg | ORAL_TABLET | Freq: Once | ORAL | Status: AC
  Administered 2016-11-11: 0.5 mg via ORAL
  Filled 2016-11-11: qty 1

## 2016-11-11 NOTE — Progress Notes (Signed)
ANTICOAGULATION CONSULT NOTE   Pharmacy Consult for warfarin Indication: atrial fibrillation  No Known Allergies  Patient Measurements: Height: 5\' 5"  (165.1 cm) Weight: 137 lb 14.4 oz (62.6 kg) IBW/kg (Calculated) : 61.5  Vital Signs: BP: 122/63 (05/05 0846) Pulse Rate: 90 (05/05 0846)  Labs:  Recent Labs  11/09/16 0346 11/10/16 0432 11/11/16 1155  HGB 8.0* 8.0* 7.7*  HCT 25.1* 25.1* 24.6*  PLT 185 172 186  LABPROT 71.7* 31.7* 32.7*  INR 8.30* 2.99 3.11  CREATININE 1.67* 1.64* 1.54*    Estimated Creatinine Clearance: 24.4 mL/min (A) (by C-G formula based on SCr of 1.54 mg/dL (H)).   Medical History: Past Medical History:  Diagnosis Date  . Alzheimer's disease, focal onset   . CAD (coronary artery disease)   . Chronic atrial fibrillation ([place])   . Essential hypertension   . Low back pain   . Prostate cancer ([place]) 1980s  . Recurrent major depression ([place])   . Stroke [facility]) 1990s     Assessment: 83 yo male on warfarin prior to admit for history of atrial fibrillation admitted on 5/2 with concern for PNA and supratherapeutic INR.  INR today is 2.99 s/p 2 mg IV vitamin K on 5/3.  Pharmacy asked to resume warfarin on 5/4.   INR 3.11 < 2.99 following 1 mg dose on 5/4. Hgb slightly lower at 7.7 with no reported bleeding.   PTA dose:  5mg  daily except 4mg  qMon/Fri per NH MAR,  Goal of Therapy:  INR 2-3 Monitor platelets by anticoagulation protocol: Yes   Plan:  1. Warfarin 0.5 mg x 1 this evening 2. Daily INR  Vincenza Hews, PharmD, BCPS 11/11/2016, 2:15 PM

## 2016-11-11 NOTE — Progress Notes (Signed)
PROGRESS NOTE  Kurt Thomas  IEP:[id number] DOB: 1919/07/06 DOA: 11/08/2016 PCP: Lawerance Cruel, MD  Brief Narrative:   Kurt Thomas is a 83 y.o. male with history of remote CVA, remote prostate CA, HTN, afib on Coumadin with difficulty maintaining appropriate INR, CAD, and Alzheimer's dementia presented from [facility] due to hypoxia, and respiratory distress.  He is wheelchair bound since the 2nd hip fracture in Nov/Dec of last year.  He had a CT the day prior to admission which confirmed a slow growing RCC.  In the ER, he was in respiratory distress and placed on bipap.  CXR was concerning for pneumonia so he was started on antibiotics for HCAP.  He was transitioned off bipap the morning of 5/3 and transitioned out of SDU the same day. Also, his INR was 7.6 on admission and rose so he was given one dose of vitamin K and his INR has trended down.  Repeat CXR this morning demonstrates improved opacities.  Case discussed with daughter who reports that patient has been aspirating and on aspiration precautions at SNF.  His symptoms started after drinking cola through a straw.  He is normally on thickened liquids.  Changed antibiotics to unasyn for likely aspiration.    Assessment & Plan:   Principal Problem:   Acute respiratory failure (HCC) Active Problems:   Atrial fibrillation (HCC)   CKD (chronic kidney disease) stage 3, GFR 30-59 ml/min   Chronic anticoagulation   HCAP (healthcare-associated pneumonia)   Sepsis ([place])   Hyperglycemia   Renal mass   Dementia  Acute respiratory failure with sepsis resulting from aspiration pneumonia (vs HCAP) with right greater than left infiltrates on CXR.   - Leukocytosis resolved  - Continue unasyn -Sputum cultures -Blood culture NGTD -  Aspiration precautions -  SLP evaluation pending -  Continue to try to transition off oxygen/nasal canula  Weak cough, poor prognostic sign -  Flutter valve, incentive spirometry  Afib on Coumadin,  CHA2DS2-VASc score appears to be 6, rate controlled - INR rose so given vitamin K once on 5/3 - NO actively bleeding  - Troponin 0 - warfarin per pharmacy  CKD stage 3 and slow progressive RCC, creatinine stable at 1.6 -The patient gets very upset with the word "cancer" so please avoid saying it in front of him if possible -With this current cancer, he appears to be more likely to die with it than from it based on its slow rate of growth   Hyperglycemia, likely stress response, improved  Dementia -Family reports overall good quality of life, which is their goal for him at this time -He enjoys visiting with friends and is quite social, despite his inability to walk -His family does not wish to escalate his care further at this time  CAD, chest pain free -  No aspirin due to elevated INR -  Continue bidil, carvedilol  Hypertension, blood pressure low normal -  Continue norvasc  Iron deficiency anemia, chronic, hemoglobin trending down slightly but may be hemodilutional -  Continue iron supplementation  DVT prophylaxis: therapeutic INR, start SCDs Code Status: DNR - confirmed with family.  No escalation of care - no transfer to ICU, no pressors, no CVL. Family Communication: Daughter by phone on 5/4.  Questions answered.  Labs and plan for tomorrow reviewed.   Disposition Plan:   Anticipate back to [facility] tomorrow  Consultants:   none  Procedures:  bipap   Antimicrobials:  Anti-infectives    Start  Dose/Rate Route Frequency Ordered Stop   11/10/16 1800  ampicillin-sulbactam (UNASYN) 1.5 g in sodium chloride 0.9 % 50 mL IVPB     1.5 g 100 mL/hr over 30 Minutes Intravenous Every 12 hours 11/10/16 1216     11/09/16 2200  ceFEPIme (MAXIPIME) 1 g in dextrose 5 % 50 mL IVPB  Status:  Discontinued    Comments:  Cefepime 1 g IV q24h for CrCl< 30 mL/min   1 g 100 mL/hr over 30 Minutes Intravenous Daily at bedtime 11/08/16 2253 11/10/16 1207   11/09/16 2000   vancomycin (VANCOCIN) IVPB 750 mg/150 ml premix  Status:  Discontinued     750 mg 150 mL/hr over 60 Minutes Intravenous Every 24 hours 11/08/16 1955 11/10/16 1207   11/08/16 1945  ceFEPIme (MAXIPIME) 1 g in dextrose 5 % 50 mL IVPB     1 g 100 mL/hr over 30 Minutes Intravenous  Once 11/08/16 1933 11/08/16 2258   11/08/16 1945  vancomycin (VANCOCIN) 1,250 mg in sodium chloride 0.9 % 250 mL IVPB     1,250 mg 166.7 mL/hr over 90 Minutes Intravenous  Once 11/08/16 1941 11/09/16 0027       Subjective:  Denies chest pain, SOB, nausea, abdominal pain.  Intermittently very rhonchorous.  Has weak cough but still able to clear some thick white secretions.    Objective: Vitals:   11/11/16 0540 11/11/16 0731 11/11/16 0846 11/11/16 1417  BP: 138/80  122/63   Pulse: 83  90   Resp: 18     Temp:      TempSrc:      SpO2: 92% 94%  93%  Weight:      Height:        Intake/Output Summary (Last 24 hours) at 11/11/16 1437 Last data filed at 11/11/16 0958  Gross per 24 hour  Intake              227 ml  Output                0 ml  Net              227 ml   Filed Weights   11/08/16 1909 11/08/16 2254  Weight: 61.2 kg (134 lb 14.7 oz) 62.6 kg (137 lb 14.4 oz)    Examination:  General exam:  Adult male, thin.  Mild tachypnea. No acute distress HEENT:  NCAT, MMM Respiratory system:   Diminished at the bilateral bases with copious rhonchi through, no focal rales and wheeze has improved Cardiovascular system:  IRRR.  3/6 systolic murmur throughout precordium.  Warm extremities Gastrointestinal system: Normal active bowel sounds, scaphoid, soft, nondistended, nontender. MSK:  Normal tone and bulk, no lower extremity edema Neuro:  Grossly moves all extremities    Data Reviewed: I have personally reviewed following labs and imaging studies  CBC:  Recent Labs Lab 11/08/16 1845 11/09/16 0346 11/10/16 0432 11/11/16 1155  WBC 12.8* 14.5* 7.0 7.9  NEUTROABS 11.4* 12.7*  --   --   HGB 9.5*  8.0* 8.0* 7.7*  HCT 30.5* 25.1* 25.1* 24.6*  MCV 94.1 93.7 95.4 95.3  PLT 262 185 172 916   Basic Metabolic Panel:  Recent Labs Lab 11/08/16 1845 11/09/16 0346 11/10/16 0432 11/11/16 1155  NA 137 136 138 140  K 4.6 4.8 4.5 4.6  CL 105 106 106 110  CO2 22 21* 22 21*  GLUCOSE 120* 124* 100* 135*  BUN 33* 34* 37* 41*  CREATININE 1.63* 1.67* 1.64* 1.54*  CALCIUM 9.2 8.7* 8.7* 8.8*   GFR: Estimated Creatinine Clearance: 24.4 mL/min (A) (by C-G formula based on SCr of 1.54 mg/dL (H)). Liver Function Tests:  Recent Labs Lab 11/08/16 1845  AST 17  ALT 14*  ALKPHOS 100  BILITOT 0.5  PROT 6.2*  ALBUMIN 3.5   No results for input(s): LIPASE, AMYLASE in the last 168 hours. No results for input(s): AMMONIA in the last 168 hours. Coagulation Profile:  Recent Labs Lab 11/08/16 1845 11/09/16 0346 11/10/16 0432 11/11/16 1155  INR 7.61* 8.30* 2.99 3.11   Cardiac Enzymes: No results for input(s): CKTOTAL, CKMB, CKMBINDEX, TROPONINI in the last 168 hours. BNP (last 3 results) No results for input(s): PROBNP in the last 8760 hours. HbA1C: No results for input(s): HGBA1C in the last 72 hours. CBG: No results for input(s): GLUCAP in the last 168 hours. Lipid Profile: No results for input(s): CHOL, HDL, LDLCALC, TRIG, CHOLHDL, LDLDIRECT in the last 72 hours. Thyroid Function Tests: No results for input(s): TSH, T4TOTAL, FREET4, T3FREE, THYROIDAB in the last 72 hours. Anemia Panel: No results for input(s): VITAMINB12, FOLATE, FERRITIN, TIBC, IRON, RETICCTPCT in the last 72 hours. Urine analysis:    Component Value Date/Time   COLORURINE YELLOW 07/24/2016 1554   APPEARANCEUR HAZY (A) 07/24/2016 1554   LABSPEC 1.015 07/24/2016 1554   PHURINE 5.0 07/24/2016 1554   GLUCOSEU NEGATIVE 07/24/2016 1554   HGBUR MODERATE (A) 07/24/2016 1554   BILIRUBINUR NEGATIVE 07/24/2016 1554   KETONESUR NEGATIVE 07/24/2016 1554   PROTEINUR NEGATIVE 07/24/2016 1554   UROBILINOGEN 0.2  09/03/2014 2025   NITRITE NEGATIVE 07/24/2016 1554   LEUKOCYTESUR LARGE (A) 07/24/2016 1554   Sepsis Labs: @LABRCNTIP (procalcitonin:4,lacticidven:4)  ) Recent Results (from the past 240 hour(s))  MRSA PCR Screening     Status: Abnormal   Collection Time: 11/08/16 10:53 PM  Result Value Ref Range Status   MRSA by PCR POSITIVE (A) NEGATIVE Final    Comment:        The GeneXpert MRSA Assay (FDA approved for NASAL specimens only), is one component of a comprehensive MRSA colonization surveillance program. It is not intended to diagnose MRSA infection nor to guide or monitor treatment for MRSA infections. RESULT CALLED TO, READ BACK BY AND VERIFIED WITH: S.CORIEA,RN AT 0331   Culture, blood (routine x 2) Call MD if unable to obtain prior to antibiotics being given     Status: None (Preliminary result)   Collection Time: 11/08/16 11:45 PM  Result Value Ref Range Status   Specimen Description BLOOD LEFT HAND  Final   Special Requests   Final    BOTTLES DRAWN AEROBIC AND ANAEROBIC Blood Culture adequate volume   Culture NO GROWTH 2 DAYS  Final   Report Status PENDING  Incomplete  Culture, blood (routine x 2) Call MD if unable to obtain prior to antibiotics being given     Status: None (Preliminary result)   Collection Time: 11/08/16 11:55 PM  Result Value Ref Range Status   Specimen Description BLOOD LEFT ANTECUBITAL  Final   Special Requests   Final    BOTTLES DRAWN AEROBIC AND ANAEROBIC Blood Culture adequate volume   Culture NO GROWTH 2 DAYS  Final   Report Status PENDING  Incomplete      Radiology Studies: Dg Chest Port 1 View  Result Date: 11/10/2016 CLINICAL DATA:  Shortness of breath . EXAM: PORTABLE CHEST 1 VIEW COMPARISON:  11/08/2016 . FINDINGS: Prior CABG. Stable cardiomegaly. Persistent but improving bilateral pulmonary interstitial infiltrates/edema. Findings consistent with  improving CHF. Small bilateral pleural effusions. No pneumothorax . Barium is noted in the  colon. IMPRESSION: 1.  Prior CABG. 2. Cardiomegaly with bilateral pulmonary interstitial prominence and bilateral pleural effusions consistent with CHF. Pulmonary interstitial edema has improved from prior exam. Electronically Signed   By: McFarlan   On: 11/10/2016 07:55     Scheduled Meds: . amLODipine  10 mg Oral Daily  . brimonidine  1 drop Both Eyes BID   And  . timolol  1 drop Both Eyes BID  . carvedilol  12.5 mg Oral Q breakfast  . Chlorhexidine Gluconate Cloth  6 each Topical Q0600  . DULoxetine  30 mg Oral Daily  . famotidine  20 mg Oral QHS  . feeding supplement (ENSURE ENLIVE)  237 mL Oral BID BM  . ferrous sulfate  325 mg Oral Q breakfast  . guaiFENesin  600 mg Oral BID  . ipratropium-albuterol  3 mL Nebulization TID  . isosorbide-hydrALAZINE  1 tablet Oral BID  . latanoprost  1 drop Both Eyes QHS  . mirabegron ER  25 mg Oral Q breakfast  . mirtazapine  15 mg Oral QHS  . mupirocin ointment  1 application Nasal BID  . tamsulosin  0.4 mg Oral QHS  . warfarin  0.5 mg Oral ONCE-1800  . Warfarin - Pharmacist Dosing Inpatient   Does not apply q1800  . zinc oxide  1 application Topical Daily   Continuous Infusions: . ampicillin-sulbactam (UNASYN) IV Stopped (11/11/16 0645)     LOS: 3 days    Time spent: 30 min    Janece Canterbury, MD [facility] Pager [phone]  If 7PM-7AM, please contact night-coverage [email or web address] Password [facility] 11/11/2016, 2:37 PM

## 2016-11-12 LAB — CBC
HCT: 25.7 % — ABNORMAL LOW (ref 39.0–52.0)
HEMOGLOBIN: 8.2 g/dL — AB (ref 13.0–17.0)
MCH: 30.3 pg (ref 26.0–34.0)
MCHC: 31.9 g/dL (ref 30.0–36.0)
MCV: 94.8 fL (ref 78.0–100.0)
PLATELETS: 197 10*3/uL (ref 150–400)
RBC: 2.71 MIL/uL — ABNORMAL LOW (ref 4.22–5.81)
RDW: 15.4 % (ref 11.5–15.5)
WBC: 7 10*3/uL (ref 4.0–10.5)

## 2016-11-12 LAB — BASIC METABOLIC PANEL
ANION GAP: 10 (ref 5–15)
BUN: 42 mg/dL — ABNORMAL HIGH (ref 6–20)
CALCIUM: 9.3 mg/dL (ref 8.9–10.3)
CO2: 23 mmol/L (ref 22–32)
CREATININE: 1.55 mg/dL — AB (ref 0.61–1.24)
Chloride: 110 mmol/L (ref 101–111)
GFR calc Af Amer: 42 mL/min — ABNORMAL LOW (ref >60)
GFR, EST NON AFRICAN AMERICAN: 36 mL/min — AB (ref >60)
GLUCOSE: 93 mg/dL (ref 65–99)
Potassium: 4.5 mmol/L (ref 3.5–5.1)
Sodium: 143 mmol/L (ref 135–145)

## 2016-11-12 LAB — PROTIME-INR
INR: 3.02
PROTHROMBIN TIME: 32 s — AB (ref 11.4–15.2)

## 2016-11-12 MED ORDER — NYSTATIN 100000 UNIT/ML MT SUSP: 5.0000 mL | Freq: Four times a day (QID) | OROMUCOSAL | 0 refills | Status: AC

## 2016-11-12 MED ORDER — WARFARIN SODIUM 1 MG PO TABS: 1.0000 mg | ORAL_TABLET | Freq: Every day | ORAL | 0 refills | Status: AC

## 2016-11-12 MED ORDER — GUAIFENESIN ER 600 MG PO TB12: 600.0000 mg | ORAL_TABLET | Freq: Two times a day (BID) | ORAL | 0 refills | Status: AC

## 2016-11-12 MED ORDER — AMOXICILLIN-POT CLAVULANATE 500-125 MG PO TABS: 1.0000 | ORAL_TABLET | Freq: Two times a day (BID) | ORAL | 0 refills | Status: AC

## 2016-11-12 MED ORDER — WARFARIN 0.5 MG HALF TABLET
0.5000 mg | ORAL_TABLET | Freq: Once | ORAL | Status: AC
  Administered 2016-11-12: 0.5 mg via ORAL
  Filled 2016-11-12: qty 1

## 2016-11-12 MED ORDER — NYSTATIN 100000 UNIT/ML MT SUSP
5.0000 mL | Freq: Four times a day (QID) | OROMUCOSAL | Status: DC
  Administered 2016-11-12 – 2016-11-13 (×6): 500000 [IU] via ORAL
  Filled 2016-11-12 (×6): qty 5

## 2016-11-12 NOTE — Progress Notes (Addendum)
[place] for warfarin Indication: atrial fibrillation  No Known Allergies  Patient Measurements: Height: 5\' 5"  (165.1 cm) Weight: 137 lb 14.4 oz (62.6 kg) IBW/kg (Calculated) : 61.5  Vital Signs: Temp: 97.8 F (36.6 C) (05/06 0428) Temp Source: Oral (05/06 0428) BP: 153/76 (05/06 0804) Pulse Rate: 90 (05/06 0804)  Labs:  Recent Labs  11/10/16 0432 11/11/16 1155 11/12/16 0335  HGB 8.0* 7.7* 8.2*  HCT 25.1* 24.6* 25.7*  PLT 172 186 197  LABPROT 31.7* 32.7* 32.0*  INR 2.99 3.11 3.02  CREATININE 1.64* 1.54* 1.55*    Medical History: Past Medical History:  Diagnosis Date  . Alzheimer's disease, focal onset   . CAD (coronary artery disease)   . Chronic atrial fibrillation ([place])   . Essential hypertension   . Low back pain   . Prostate cancer ([place]) 1980s  . Recurrent major depression ([place])   . Stroke [facility]) 1990s    Assessment: 83 yo male on warfarin prior to admit for history of atrial fibrillation admitted on 5/2 with concern for PNA and supratherapeutic INR. Received 2 mg vitk IV on 5/3.  INR 3.02 < 3.11 following 0.5 mg dose of warfarin on 5/5. CBC stable.   PTA dose:  5mg  daily except 4mg  qMon/Fri per [facility] MAR,  Goal of Therapy:  INR 2-3 Monitor platelets by anticoagulation protocol: Yes   Plan:  1. Repeat Warfarin 0.5 mg x 1 this evening 2. Daily INR  Vincenza Hews, PharmD, BCPS 11/12/2016, 11:09 AM

## 2016-11-12 NOTE — Evaluation (Addendum)
Clinical/Bedside Swallow Evaluation Patient Details  Name: Kurt Thomas MRN: [id number] Date of Birth: [birth year]  Today's Date: 11/12/2016 Time: SLP Start Time (ACUTE ONLY): 1025 SLP Stop Time (ACUTE ONLY): 1040 SLP Time Calculation (min) (ACUTE ONLY): 15 min  Past Medical History:  Past Medical History:  Diagnosis Date  . Alzheimer's disease, focal onset   . CAD (coronary artery disease)   . Chronic atrial fibrillation ([place])   . Essential hypertension   . Low back pain   . Prostate cancer ([place]) 1980s  . Recurrent major depression ([place])   . Stroke [facility]) 1990s   Past Surgical History:  Past Surgical History:  Procedure Laterality Date  . CATARACT EXTRACTION    . CORONARY ARTERY BYPASS GRAFT    . FEMUR IM NAIL Right 06/05/2016   Procedure: INTRAMEDULLARY (IM) NAIL FEMORAL;  Surgeon: Mcarthur Rossetti, MD;  Location: [facility];  Service: Orthopedics;  Laterality: Right;  . HIP ARTHROPLASTY Left 06/22/2014   Procedure: LEFT HEMIARTHROPLASTY HIP;  Surgeon: Meredith Pel, MD;  Location: [facility];  Service: Orthopedics;  Laterality: Left;  . PROSTATECTOMY     HPI:  Kurt Thomas a 83 y.o.malewith history of remote CVA, remote prostate CA, HTN, afib on Coumadin with difficulty maintaining appropriate INR, CAD, and Alzheimer's dementia presented from [facility] due to hypoxia, and respiratory distress. He is wheelchair bound since the 2nd hip fracture in Nov/Dec of last year. He had a CT the day prior to admission which confirmed a slow growing RCC. In the ER, he was in respiratory distress and placed on bipap. CXR was concerning for pneumonia so he was started on antibiotics for HCAP. He was transitioned off bipap the morning of 5/3 and transitioned out of SDU the same day. Repeat CXR this morning demonstrates improved opacities. Case discussed with daughter who reports that patient has been aspirating and on aspiration precautions at SNF. His symptoms started after drinking cola  through a straw. He is normally on thickened liquids. Changedantibiotics to unasyn for likely aspiration. Pt last seen for MBS 06/30/16 as an outpatient with findings of suspected primary esophageal dysphagia, moderate oral and moderate pharyngeal dysphagia. Pt with silent aspiration of thin liquids. A short term diet modification of dysphagia 3/nectar thick liquids recommended as pt rehabed from hip fx, though long term thickened liquids were not recommended for a pt of advanced age with a significant esophageal phase dysphagia that is likely to aspirate reflux/backflow from esophagus as well.   Assessment / Plan / Recommendation Clinical Impression  Pt presents with severe risk for aspiration in the setting of current pneumonia, dementia, history of oropharyngeal and primary esophageal dysphagia, prior CVA. Suspect pt's dysphagia is primarily esophageal in etiology, recommend GI consult. Clinical appearance is consistent with most recent MBS which recommended nectar thick liquids in the short term only due to pt's age and increased risk for reflux on thickened liquids and potential for aspiration of gastric contents. Pt with immediate cough following thin liquids, suggestive of reduced airway protection. Pt initially tolerates nectar thick liquids, solids with no overt signs of aspiration, however with larger volumes and with extended time, pt presents with delayed, wet coughing. Suspect aspiration of pharyngeal residue/esophageal backflow based on observed impairments on recent MBS. No family present to clarify goals of care. Conservative approach at this time would be NPO ice chips/sips of water only within 30 minutes of oral care, necessary oral medications given crushed in puree, liquid medications thickened to nectar consistency. If family wishes to  pursue comfort options, recommend dysphagia 3 with thin liquids, as pt expresses desire to drink water. Pt may benefit from repeat instrumental study (MBS)  depending on family's goals of care. Spoke with MD who will speak with family; defer diet changes to MD pending family consult. SLP will follow.  SLP Visit Diagnosis: Dysphagia, unspecified (R13.10)    Aspiration Risk  Severe aspiration risk;Risk for inadequate nutrition/hydration    Diet Recommendation NPO;Free water protocol after oral care;Ice chips PRN after oral care;Other (Comment) (Palliative rec: dys 3 with thin liquids)   Liquid Administration via: Cup;No straw;Spoon Medication Administration: Crushed with puree (liquid medications thickened to nectar) Supervision: Full supervision/cueing for compensatory strategies;Staff to assist with self feeding Compensations: Slow rate;Small sips/bites;Multiple dry swallows after each bite/sip;Clear throat after each swallow Postural Changes: Seated upright at 90 degrees;Remain upright for at least 30 minutes after po intake    Other  Recommendations Recommended Consults: Consider GI evaluation;Consider esophageal assessment Oral Care Recommendations: Oral care prior to ice chip/H20;Oral care QID Other Recommendations: Prohibited food (jello, ice cream, thin soups);Have oral suction available   Follow up Recommendations Other (comment) (TBD)      Frequency and Duration min 2x/week  2 weeks       Prognosis Prognosis for Safe Diet Advancement: Fair Barriers to Reach Goals: Severity of deficits;Cognitive deficits      Swallow Study   General Date of Onset: 11/12/16 HPI: Kurt Thomas a 83 y.o.malewith history of remote CVA, remote prostate CA, HTN, afib on Coumadin with difficulty maintaining appropriate INR, CAD, and Alzheimer's dementia presented from [facility] due to hypoxia, and respiratory distress. He is wheelchair bound since the 2nd hip fracture in Nov/Dec of last year. He had a CT the day prior to admission which confirmed a slow growing RCC. In the ER, he was in respiratory distress and placed on bipap. CXR was  concerning for pneumonia so he was started on antibiotics for HCAP. He was transitioned off bipap the morning of 5/3 and transitioned out of SDU the same day. Repeat CXR this morning demonstrates improved opacities. Case discussed with daughter who reports that patient has been aspirating and on aspiration precautions at SNF. His symptoms started after drinking cola through a straw. He is normally on thickened liquids. Changedantibiotics to unasyn for likely aspiration. Pt last seen for MBS 06/30/16 as an outpatient with findings of suspected primary esophageal dysphagia, moderate oral and moderate pharyngeal dysphagia. Pt with silent aspiration of thin liquids. A short term diet modification of dysphagia 3/nectar thick liquids recommended as pt rehabed from hip fx, though long term thickened liquids were not recommended for a pt of advanced age with a significant esophageal phase dysphagia that is likely to aspirate reflux/backflow from esophagus as well. Type of Study: Bedside Swallow Evaluation Previous Swallow Assessment: see HPI Diet Prior to this Study: Nectar-thick liquids;Dysphagia 3 (soft) Temperature Spikes Noted: No Respiratory Status: Nasal cannula History of Recent Intubation: No Behavior/Cognition: Alert;Cooperative Oral Cavity Assessment: Dry;Dried secretions Oral Care Completed by SLP: Yes Oral Cavity - Dentition: Missing dentition;Poor condition Vision: Impaired for self-feeding Self-Feeding Abilities: Total assist Patient Positioning: Upright in bed Baseline Vocal Quality: Wet;Hoarse;Low vocal intensity Volitional Cough: Cognitively unable to elicit Volitional Swallow: Unable to elicit    Oral/Motor/Sensory Function Overall Oral Motor/Sensory Function: Generalized oral weakness (pt weak, could not fully assess)   Ice Chips Ice chips: Impaired Presentation: Spoon Pharyngeal Phase Impairments: Cough - Immediate;Wet Vocal Quality   Thin Liquid Thin Liquid:  Impaired Presentation: Cup;Spoon Oral  Phase Impairments: Reduced labial seal;Poor awareness of bolus Oral Phase Functional Implications: Right anterior spillage Pharyngeal  Phase Impairments: Wet Vocal Quality;Multiple swallows;Cough - Immediate    Nectar Thick Nectar Thick Liquid: Impaired Presentation: Cup;Spoon;Straw Pharyngeal Phase Impairments: Cough - Delayed   Honey Thick Honey Thick Liquid: Not tested   Puree Puree: Within functional limits   Solid   GO   Solid: Impaired Oral Phase Impairments: Impaired mastication Oral Phase Functional Implications: Impaired mastication Pharyngeal Phase Impairments: Cough - Delayed        Aliene Altes 11/12/2016,11:12 AM  Deneise Lever, MS, [place] Pathologist [phone]

## 2016-11-12 NOTE — Discharge Summary (Addendum)
Physician Discharge Summary  Kurt Thomas QBH:[id number] DOB: 1918/07/18 DOA: 11/08/2016  PCP: Lawerance Cruel, MD  Admit date: 11/08/2016 Discharge date: pending  Admitted From: [facility]  (wheelchair bound prior to admission) Disposition:  [facility]   Recommendations for Outpatient Follow-up:  1. Palliative care consult 2. Recommend warfarin 1mg  nightly and repeat INR on Tuesday 3. PT/OT and SLP 4. Continue Augmentin and guaifenesin for 5 more days 5. Encourage flutter valve/incentive spirometry 6. Duonebs with prn albuterol until improved 7. Strict aspiration precautions as below.  No straws please.  Equipment/Devices:  Already wheelchair dependent.  Discharge Condition:  Stable, improved CODE STATUS:  DNR/DNI  Diet recommendation:  Dysphagia 3 with thin liquids   Liquid Administration via: Cup;No straw;Spoon Medication Administration: Crushed with puree (liquid medications thickened to nectar) Supervision: Full supervision/cueing for compensatory strategies;Staff to assist with self feeding Compensations: Slow rate;Small sips/bites;Multiple dry swallows after each bite/sip;Clear throat after each swallow Postural Changes: Seated upright at 90 degrees;Remain upright for at least 30 minutes after po intake   Brief/Interim Summary:  Kurt Thomas a 83 y.o.malewith history of remote CVA, remote prostate CA, HTN, afib on Coumadin with difficulty maintaining appropriate INR, CAD, and Alzheimer's dementia presented from [facility] due to hypoxia, and respiratory distress.  He is wheelchair bound since the 2nd hip fracture in Nov/Dec of last year.  He had a CT the day prior to admission which confirmed a slow growing RCC.  In the ER, he was in respiratory distress and placed on bipap.  CXR was concerning for pneumonia so he was started on antibiotics for HCAP.  He was transitioned off bipap the morning of 5/3 and transitioned out of SDU the same day. Also, his INR was 7.6  on admission and rose so he was given one dose of vitamin K and his INR has trended down.  Repeat CXR this morning demonstrates improved opacities.  Case discussed with daughter who reports that patient has been aspirating and on aspiration precautions at SNF.  His symptoms started after drinking cola through a straw.  He is normally on thickened liquids.  Changed antibiotics to unasyn for likely aspiration.  He was evaluated by SLP here who recommended GI evaluation, but family elected no further interventions and preferred a comfort approach/palliative dysphagia 3 diet with thin liquids.  Patient and family would like him to return to [facility] as soon as possible.    Discharge Diagnoses:  Principal Problem:   Aspiration pneumonia ([place]) Active Problems:   Atrial fibrillation (HCC)   CKD (chronic kidney disease) stage 3, GFR 30-59 ml/min   Chronic anticoagulation   HCAP (healthcare-associated pneumonia)   Sepsis ([place])   Acute respiratory failure (HCC)   Hyperglycemia   Renal mass   Dementia  Acute respiratory failure with sepsis resulting from aspiration pneumonia (vs HCAP) with right greater than left infiltrates on CXR.   - Leukocytosis resolved  - Given unasyn during hospitalization -  Transition to augmentin at discharge  -  Blood culture NGTD -  Aspiration precautions to continue at SNF.   -  SLP recommended GI consultation, however, family would like to minimize further testing and allow Kurt Thomas to eat.   -  Wean patient to room air today  Weak cough, poor prognostic sign but cough improved during stay -  Continue flutter valve, incentive spirometry   Afib on Coumadin, CHA2DS2-VASc score appears to be 6, rate controlled with supratherapeutic INR on admission - INR rose to 8.3 on 5/3  so given vitamin K once on 5/3 - No activebleeding  - Troponin 0 -  INR on date of discharge 3.02. -  Recommend warfarin 1mg  nightly and repeat INR on Tuesday  CKD stage 3 and slow  progressive RCC, creatinine stable at 1.6 -The patient gets very upset with the word "cancer" so please avoid saying it in front of him if possible -With this current cancer, he appears to be more likely to die with it than from it based on its slow rate of growth   Hyperglycemia, likely stress response, improved  Dementia -Family reports overall good quality of life, which is their goal for him at this time -He enjoys visiting with friends and is quite social, despite his inability to walk -His family does not wish to escalate his care further at this time  CAD, chest pain free -  No aspirin due to elevated INR -  Continue bidil, carvedilol  Hypertension, blood pressure low normal -  Continue norvasc  Iron deficiency anemia, chronic, hemoglobin trending down slightly but may be hemodilutional -  Continue iron supplementation  Discharge Instructions  Discharge Instructions    Diet general    Complete by:  As directed    Increase activity slowly    Complete by:  As directed        Medication List    STOP taking these medications   diphenhydrAMINE 12.5 MG/5ML syrup Commonly known as:  BENYLIN   docusate sodium 100 MG capsule Commonly known as:  COLACE   feeding supplement (PRO-STAT SUGAR FREE 64) Liqd   HYDROcodone-acetaminophen 5-325 MG tablet Commonly known as:  NORCO/VICODIN   loperamide 2 MG capsule Commonly known as:  IMODIUM   neomycin-bacitracin-polymyxin ointment Commonly known as:  NEOSPORIN   ondansetron 4 MG tablet Commonly known as:  ZOFRAN   UNABLE TO FIND     TAKE these medications   albuterol (2.5 MG/3ML) 0.083% nebulizer solution Commonly known as:  PROVENTIL Take 2.5 mg by nebulization every 6 (six) hours as needed for wheezing.   amLODipine 10 MG tablet Commonly known as:  NORVASC Take 1 tablet (10 mg total) by mouth daily.   amoxicillin-clavulanate 500-125 MG tablet Commonly known as:  AUGMENTIN Take 1 tablet (500 mg total) by  mouth 2 (two) times daily.   brimonidine-timolol 0.2-0.5 % ophthalmic solution Commonly known as:  COMBIGAN Place 1 drop into both eyes 2 (two) times daily.   CALMOSEPTINE 0.44-20.6 % Oint Generic drug:  Menthol-Zinc Oxide Apply 1 application topically See admin instructions. TO BUTTOCKS ONCE A DAY PREVENTATIVELY   carvedilol 12.5 MG tablet Commonly known as:  COREG Take 12.5 mg by mouth every morning.   DECUBI-VITE PO Take 1 tablet by mouth daily.   DULoxetine 30 MG capsule Commonly known as:  CYMBALTA Take 30 mg by mouth daily.   ferrous sulfate 325 (65 FE) MG tablet Take 325 mg by mouth daily.   fluticasone 50 MCG/ACT nasal spray Commonly known as:  FLONASE Place 2 sprays into both nostrils at bedtime.   guaiFENesin 600 MG 12 hr tablet Commonly known as:  MUCINEX Take 1 tablet (600 mg total) by mouth 2 (two) times daily.   ipratropium-albuterol 0.5-2.5 (3) MG/3ML Soln Commonly known as:  DUONEB Take 3 mLs by nebulization every 6 (six) hours as needed.   isosorbide-hydrALAZINE 20-37.5 MG tablet Commonly known as:  BIDIL Take 1 tablet by mouth 2 (two) times daily.   latanoprost 0.005 % ophthalmic solution Commonly known as:  Tax inspector  1 drop into both eyes at bedtime.   mirtazapine 15 MG tablet Commonly known as:  REMERON Take 15 mg by mouth at bedtime.   MYRBETRIQ 25 MG Tb24 tablet Generic drug:  mirabegron ER Take 25 mg by mouth daily with breakfast.   NUTRITIONAL SUPPLEMENT Liqd Take 1 each by mouth daily with lunch. *Magic Cup*   NUTRITIONAL DRINK Liqd Take 120 mLs by mouth 3 (three) times daily between meals. *Med Pass*   nystatin 100000 UNIT/ML suspension Commonly known as:  MYCOSTATIN Take 5 mLs (500,000 Units total) by mouth 4 (four) times daily.   polyethylene glycol packet Commonly known as:  MIRALAX / GLYCOLAX Take 17 g by mouth daily as needed for mild constipation. Mix in 8 oz liquid and drink   ranitidine 150 MG tablet Commonly  known as:  ZANTAC Take 150 mg by mouth at bedtime.   senna-docusate 8.6-50 MG tablet Commonly known as:  Senokot-S Take 2 tablets by mouth 2 (two) times daily.   tamsulosin 0.4 MG Caps capsule Commonly known as:  FLOMAX Take 0.4 mg by mouth at bedtime.   warfarin 1 MG tablet Commonly known as:  COUMADIN Take 1 tablet (1 mg total) by mouth daily at 6 PM. What changed:  medication strength  how much to take  when to take this  additional instructions  Another medication with the same name was removed. Continue taking this medication, and follow the directions you see here.      Follow-up Information    Lawerance Cruel, MD. Schedule an appointment as soon as possible for a visit in 1 week(s).   Specialty:  Family Medicine Contact information: [place] [address] [phone]          No Known Allergies  Consultations: none   Procedures/Studies: Ct Abdomen Pelvis W Contrast  Result Date: 11/07/2016 CLINICAL DATA:  Weight loss, diarrhea, occasional melena.  Dementia. EXAM: CT ABDOMEN AND PELVIS WITH CONTRAST TECHNIQUE: Multidetector CT imaging of the abdomen and pelvis was performed using the standard protocol following bolus administration of intravenous contrast. CONTRAST:  25mL ISOVUE-300 IOPAMIDOL (ISOVUE-300) INJECTION 61% Creatinine was obtained on site at [facility] at [address] Results: Creatinine 1.6 mg/dL. COMPARISON:  Multiple exams, including pelvic radiographs of 09/21/2016 and abdominal ultrasound 09/04/2014 FINDINGS: Lower chest: Cardiomegaly, with four-chamber enlargement but especially of the right atrium. Epicardial pacer leads. Loculated right pleural effusion and more free-flowing left pleural effusion with some pleural enhancement is specially on the right, and right pleural thickening. Calcifications along the aortic and mitral valve. Hepatobiliary: Mild heterogeneity on the initial postcontrast series in the  hepatic parenchyma is ascribed to the early phase of contrast. No focal hepatic lesion is identified. No biliary dilatation. Pancreas: Suspected 1.1 by 0.8 cm cystic lesion of the tail the pancreas on image 26/3. Spleen: Unremarkable Adrenals/Urinary Tract: Adrenal glands normal. 5.8 by 4.7 by 5.4 cm (volume = 77 cm^3) solid enhancing mass in the left kidney upper pole extending into the left renal hilum, with central necrosis or central scar observed, and lobulations along the inferior margin of this mass. Notably, a similar size mass was identified in this region on the abdominal ultrasound from 09/04/2014. The mass splays branches of the renal artery. No definite tumor thrombus is seen in the renal vein or IVC. Multiple hypodense renal lesions are likely cysts, and are larger on the left than the right. Urinary bladder unremarkable although partially obscured by streak artifact related to the  pelvic vascular clips and bilateral hip fixation. Stomach/Bowel: There is thickening of some of the stomach wall including the gastric cardia and antrum although much of this may be related to nondistention rather than actual pathologic thickening. Mild prominence of stool in the rectal vault. Sigmoid colon diverticulosis without active diverticulitis. 0.9 by 1.2 cm filling defect in the mid transverse colon on image 29/601 could represent a colonic polyp, and appears more solid than the rest of the scattered stool in the colon. Vascular/Lymphatic: Aortoiliac atherosclerotic vascular disease. Infrarenal fusiform abdominal aortic ectasia up to 2.9 cm diameter, image 42/3. No pathologic adenopathy identified. Reproductive: Prostatectomy. The prostate bed is obscured by streak artifact from the hip implants. Other: Low-level presacral edema. Subcutaneous edema noted along the buttocks and perineum. Musculoskeletal: Callus formation in late healing response of right anterior seventh, eighth, ninth, and tenth rib fractures. Right  hip screw.  Left hip hemiarthroplasty. Lumbar spondylosis and degenerative disc disease along with somewhat congenitally Drezden Seitzinger pedicles, contributing to impingement at L3-4, L4-5, and L5-S 1. IMPRESSION: 1. 77 cubic cm solid enhancing mass in the left kidney upper pole, without much in the way of growth compared to prior ultrasound exam from 09/04/2014, probably a slow growing renal cell carcinoma, less likely but possibly oncocytoma. No current adenopathy or tumor thrombus in the left renal vein. 2. Considerable cardiomegaly, with loculated right pleural effusion and pleural thickening, and more free-flowing left pleural effusion but with some mild pleural enhancement. Accordingly exudative effusions are suspected. 3. 1.1 cm cystic lesion in the tail the pancreas. Based on the patient' s age I do not feel that this warrants further workup. 4. Potential 1.2 cm polyp in the mid transverse colon, image 29/601. 5. Aortic Atherosclerosis (ICD10-I70.0). Infrarenal abdominal aortic ectasia at 2.9 cm diameter. 6. Low-level presacral edema and subcutaneous edema along the pelvis, uncertain significance. 7. Late phase healing response of several right anterior lower rib fractures. 8. Multilevel impingement in the lower lumbar spine due to spondylosis and degenerative disc disease. Electronically Signed   By: Van Clines M.D.   On: 11/07/2016 14:01   Dg Chest Port 1 View  Result Date: 11/10/2016 CLINICAL DATA:  Shortness of breath . EXAM: PORTABLE CHEST 1 VIEW COMPARISON:  11/08/2016 . FINDINGS: Prior CABG. Stable cardiomegaly. Persistent but improving bilateral pulmonary interstitial infiltrates/edema. Findings consistent with improving CHF. Small bilateral pleural effusions. No pneumothorax . Barium is noted in the colon. IMPRESSION: 1.  Prior CABG. 2. Cardiomegaly with bilateral pulmonary interstitial prominence and bilateral pleural effusions consistent with CHF. Pulmonary interstitial edema has improved from  prior exam. Electronically Signed   By: Marcello Moores  Register   On: 11/10/2016 07:55   Dg Chest Port 1 View  Result Date: 11/08/2016 CLINICAL DATA:  Shortness of Breath EXAM: PORTABLE CHEST 1 VIEW COMPARISON:  07/24/2016 FINDINGS: Cardiac shadow is mildly enlarged but stable. Postsurgical changes are again seen the lungs are well aerated bilaterally. Diffuse infiltrate is noted throughout the right lung and to a lesser degree in the left lung base. No bony abnormality is noted. IMPRESSION: Bilateral infiltrates right greater than left. Electronically Signed   By: Inez Catalina M.D.   On: 11/08/2016 19:25    Subjective: Still coughing up sputum but denies SOB.  Denies pain, nausea.  EAting a little.  Family feels that his cough is getting stronger.    Discharge Exam: Vitals:   11/12/16 0428 11/12/16 0804  BP: (!) 147/67 (!) 153/76  Pulse: (!) 102 90  Resp: 18  Temp: 97.8 F (36.6 C)    Vitals:   11/11/16 2246 11/12/16 0428 11/12/16 0741 11/12/16 0804  BP: 112/64 (!) 147/67  (!) 153/76  Pulse: 87 (!) 102  90  Resp: 18 18    Temp: 97.6 F (36.4 C) 97.8 F (36.6 C)    TempSrc: Oral Oral    SpO2: 99% 94% 96%   Weight:      Height:       General exam:  Adult male, thin, lying in bed, smiling.  Mild tachypnea. No acute distress, obvious rhonchi HEENT:  NCAT, MMM Respiratory system:   Diminished at the bilateral bases with copious rhonchi through, no focal rales and wheeze has improved Cardiovascular system:  IRRR.  3/6 systolic murmur throughout precordium.  Warm extremities Gastrointestinal system: Normal active bowel sounds, scaphoid, soft, nondistended, nontender. MSK:  Normal tone and bulk, no lower extremity edema Neuro:  Grossly moves all extremities   The results of significant diagnostics from this hospitalization (including imaging, microbiology, ancillary and laboratory) are listed below for reference.     Microbiology: Recent Results (from the past 240 hour(s))  MRSA PCR  Screening     Status: Abnormal   Collection Time: 11/08/16 10:53 PM  Result Value Ref Range Status   MRSA by PCR POSITIVE (A) NEGATIVE Final    Comment:        The GeneXpert MRSA Assay (FDA approved for NASAL specimens only), is one component of a comprehensive MRSA colonization surveillance program. It is not intended to diagnose MRSA infection nor to guide or monitor treatment for MRSA infections. RESULT CALLED TO, READ BACK BY AND VERIFIED WITH: S.CORIEA,RN AT 0331   Culture, blood (routine x 2) Call MD if unable to obtain prior to antibiotics being given     Status: None (Preliminary result)   Collection Time: 11/08/16 11:45 PM  Result Value Ref Range Status   Specimen Description BLOOD LEFT HAND  Final   Special Requests   Final    BOTTLES DRAWN AEROBIC AND ANAEROBIC Blood Culture adequate volume   Culture NO GROWTH 3 DAYS  Final   Report Status PENDING  Incomplete  Culture, blood (routine x 2) Call MD if unable to obtain prior to antibiotics being given     Status: None (Preliminary result)   Collection Time: 11/08/16 11:55 PM  Result Value Ref Range Status   Specimen Description BLOOD LEFT ANTECUBITAL  Final   Special Requests   Final    BOTTLES DRAWN AEROBIC AND ANAEROBIC Blood Culture adequate volume   Culture NO GROWTH 3 DAYS  Final   Report Status PENDING  Incomplete     Labs: BNP (last 3 results)  Recent Labs  11/08/16 1845  BNP 967.5*   Basic Metabolic Panel:  Recent Labs Lab 11/08/16 1845 11/09/16 0346 11/10/16 0432 11/11/16 1155 11/12/16 0335  NA 137 136 138 140 143  K 4.6 4.8 4.5 4.6 4.5  CL 105 106 106 110 110  CO2 22 21* 22 21* 23  GLUCOSE 120* 124* 100* 135* 93  BUN 33* 34* 37* 41* 42*  CREATININE 1.63* 1.67* 1.64* 1.54* 1.55*  CALCIUM 9.2 8.7* 8.7* 8.8* 9.3   Liver Function Tests:  Recent Labs Lab 11/08/16 1845  AST 17  ALT 14*  ALKPHOS 100  BILITOT 0.5  PROT 6.2*  ALBUMIN 3.5   No results for input(s): LIPASE, AMYLASE in  the last 168 hours. No results for input(s): AMMONIA in the last 168 hours. CBC:  Recent Labs  Lab 11/08/16 1845 11/09/16 0346 11/10/16 0432 11/11/16 1155 11/12/16 0335  WBC 12.8* 14.5* 7.0 7.9 7.0  NEUTROABS 11.4* 12.7*  --   --   --   HGB 9.5* 8.0* 8.0* 7.7* 8.2*  HCT 30.5* 25.1* 25.1* 24.6* 25.7*  MCV 94.1 93.7 95.4 95.3 94.8  PLT 262 185 172 186 197   Cardiac Enzymes: No results for input(s): CKTOTAL, CKMB, CKMBINDEX, TROPONINI in the last 168 hours. BNP: Invalid input(s): POCBNP CBG: No results for input(s): GLUCAP in the last 168 hours. D-Dimer No results for input(s): DDIMER in the last 72 hours. Hgb A1c No results for input(s): HGBA1C in the last 72 hours. Lipid Profile No results for input(s): CHOL, HDL, LDLCALC, TRIG, CHOLHDL, LDLDIRECT in the last 72 hours. Thyroid function studies No results for input(s): TSH, T4TOTAL, T3FREE, THYROIDAB in the last 72 hours.  Invalid input(s): FREET3 Anemia work up No results for input(s): VITAMINB12, FOLATE, FERRITIN, TIBC, IRON, RETICCTPCT in the last 72 hours. Urinalysis    Component Value Date/Time   COLORURINE YELLOW 07/24/2016 1554   APPEARANCEUR HAZY (A) 07/24/2016 1554   LABSPEC 1.015 07/24/2016 1554   PHURINE 5.0 07/24/2016 1554   GLUCOSEU NEGATIVE 07/24/2016 1554   HGBUR MODERATE (A) 07/24/2016 1554   BILIRUBINUR NEGATIVE 07/24/2016 1554   KETONESUR NEGATIVE 07/24/2016 1554   PROTEINUR NEGATIVE 07/24/2016 1554   UROBILINOGEN 0.2 09/03/2014 2025   NITRITE NEGATIVE 07/24/2016 1554   LEUKOCYTESUR LARGE (A) 07/24/2016 1554   Sepsis Labs Invalid input(s): PROCALCITONIN,  WBC,  LACTICIDVEN   Time coordinating discharge: Over 30 minutes  SIGNED:   Janece Canterbury, MD  [facility] 11/12/2016, 12:12 PM Pager   If 7PM-7AM, please contact night-coverage [email or web address] Password [email or web address]

## 2016-11-13 NOTE — Progress Notes (Signed)
Pt. On room air for 10 minutes 02 Sats remain at 95-97%

## 2016-11-13 NOTE — Progress Notes (Signed)
Patient will DC to: [facility] Anticipated DC date: 11/13/16 Family notified: Daughter Transport by: Glenna Fellows   Per MD patient ready for DC to ALF. RN, patient, patient's family, and facility notified of DC. Discharge Summary sent to facility. RN given number for report. DC packet on chart. Ambulance transport requested for patient.   CSW signing off.  Cedric Fishman, [place] Social Worker [phone]

## 2016-11-13 NOTE — Progress Notes (Signed)
Pt. Discharged to [facility] Pt. D/C'd via [facility]  Discharge information reviewed and given All personal belongings given to Pt.  Education discussed IV was d/c Sent with [facility]

## 2016-11-13 NOTE — Plan of Care (Signed)
Problem: Safety: Goal: Ability to remain free from injury will improve Outcome: Progressing Patient has not had an incidence of falls during this admission. Patient in low bed. Bed is locked. Siderails in place. Call bell within reach. Clean and clear environment maintained.  Problem: Respiratory: Goal: Ability to maintain a clear airway will improve Outcome: Progressing Patient does have a strong cough, though not coughing anything up at this time.

## 2016-11-13 NOTE — NC FL2 (Signed)
[place] LEVEL OF CARE SCREENING TOOL     IDENTIFICATION  Patient Name: Kurt Thomas Birthdate: 1919-01-10 Sex: male Admission Date (Current Location): 11/08/2016  [facility] and [place] Number:  Herbalist and Address:  [facility], [place] [address], [place], [place] [address]      Provider Number: [id number]  Attending Physician Name and Address:  Janece Canterbury, MD  Relative Name and Phone Number:       Current Level of Care: Hospital Recommended Level of Care: ALF Prior Approval Number:    Date Approved/Denied: 11/09/16 PASRR Number: n/a  Discharge Plan: ALF    Current Diagnoses: Patient Active Problem List   Diagnosis Date Noted  . Sepsis ([place]) 11/09/2016  . Acute respiratory failure ([place]) 11/09/2016  . Hyperglycemia 11/09/2016  . Renal mass 11/09/2016  . Dementia 11/09/2016  . Aspiration pneumonia ([place]) 07/25/2016  . HCAP (healthcare-associated pneumonia) 07/24/2016  . AKI (acute kidney injury) ([place]) 07/24/2016  . Diarrhea 07/24/2016  . Normocytic anemia 07/24/2016  . Subtherapeutic international normalized ratio (INR) 07/24/2016  . Pressure injury of skin 06/08/2016  . Chronic anticoagulation 06/05/2016  . BPH (benign prostatic hyperplasia) 06/05/2016  . Fractured hip, right, closed, initial encounter ([place]) 06/04/2016  . Protein-calorie malnutrition, severe ([place]) 09/05/2014  . PVC's (premature ventricular contractions)   . CKD (chronic kidney disease) stage 3, GFR 30-59 ml/min 09/03/2014  . Acute encephalopathy 09/03/2014  . Vomiting 09/03/2014  . Difficulty urinating 09/03/2014  . CAD (coronary artery disease)   . Essential hypertension 06/22/2014  . Atrial fibrillation ([place]) 06/21/2014  . Hip fracture ([place]) 06/20/2014  . Renal insufficiency 06/20/2014  . Closed left hip fracture ([place])   . Lumbar stenosis 05/07/2014  . Low back pain 03/31/2014  . Gait difficulty 03/31/2014  . High blood pressure      Orientation RESPIRATION BLADDER Height & Weight     Self  Normal External catheter, Incontinent Weight: 62.6 kg (137 lb 14.4 oz) Height:  5\' 5"  (165.1 cm)  BEHAVIORAL SYMPTOMS/MOOD NEUROLOGICAL BOWEL NUTRITION STATUS      Continent Diet (Mechanical soft)  AMBULATORY STATUS COMMUNICATION OF NEEDS Skin    (Wheelchair bound) Verbally Normal                       Personal Care Assistance Level of Assistance  Bathing, Dressing, Feeding Bathing Assistance: Maximum assistance Feeding assistance: Maximum assistance Dressing Assistance: Maximum assistance     Functional Limitations Info  Sight, Hearing, Speech Sight Info: Adequate Hearing Info: Adequate Speech Info: Adequate    SPECIAL CARE FACTORS FREQUENCY  PT (By licensed PT)     PT Frequency: 3x/week              Contractures      Additional Factors Info  Code Status, Allergies, Psychotropic Code Status Info: DNR Allergies Info: NKA Psychotropic Info: Remron          Current Medications (11/13/2016):   Discharge Medications: STOP taking these medications   diphenhydrAMINE 12.5 MG/5ML syrup Commonly known as:  BENYLIN   docusate sodium 100 MG capsule Commonly known as:  COLACE   feeding supplement (PRO-STAT SUGAR FREE 64) Liqd   HYDROcodone-acetaminophen 5-325 MG tablet Commonly known as:  NORCO/VICODIN   loperamide 2 MG capsule Commonly known as:  IMODIUM   neomycin-bacitracin-polymyxin ointment Commonly known as:  NEOSPORIN   ondansetron 4 MG tablet Commonly known as:  ZOFRAN   UNABLE TO FIND  TAKE these medications   albuterol (2.5 MG/3ML) 0.083% nebulizer solution Commonly known as:  PROVENTIL Take 2.5 mg by nebulization every 6 (six) hours as needed for wheezing.   amLODipine 10 MG tablet Commonly known as:  NORVASC Take 1 tablet (10 mg total) by mouth daily.   amoxicillin-clavulanate 500-125 MG tablet Commonly known as:  AUGMENTIN Take 1 tablet (500 mg total) by  mouth 2 (two) times daily.   brimonidine-timolol 0.2-0.5 % ophthalmic solution Commonly known as:  COMBIGAN Place 1 drop into both eyes 2 (two) times daily.   CALMOSEPTINE 0.44-20.6 % Oint Generic drug:  Menthol-Zinc Oxide Apply 1 application topically See admin instructions. TO BUTTOCKS ONCE A DAY PREVENTATIVELY   carvedilol 12.5 MG tablet Commonly known as:  COREG Take 12.5 mg by mouth every morning.   DECUBI-VITE PO Take 1 tablet by mouth daily.   DULoxetine 30 MG capsule Commonly known as:  CYMBALTA Take 30 mg by mouth daily.   ferrous sulfate 325 (65 FE) MG tablet Take 325 mg by mouth daily.   fluticasone 50 MCG/ACT nasal spray Commonly known as:  FLONASE Place 2 sprays into both nostrils at bedtime.   guaiFENesin 600 MG 12 hr tablet Commonly known as:  MUCINEX Take 1 tablet (600 mg total) by mouth 2 (two) times daily.   ipratropium-albuterol 0.5-2.5 (3) MG/3ML Soln Commonly known as:  DUONEB Take 3 mLs by nebulization every 6 (six) hours as needed.   isosorbide-hydrALAZINE 20-37.5 MG tablet Commonly known as:  BIDIL Take 1 tablet by mouth 2 (two) times daily.   latanoprost 0.005 % ophthalmic solution Commonly known as:  XALATAN Place 1 drop into both eyes at bedtime.   mirtazapine 15 MG tablet Commonly known as:  REMERON Take 15 mg by mouth at bedtime.   MYRBETRIQ 25 MG Tb24 tablet Generic drug:  mirabegron ER Take 25 mg by mouth daily with breakfast.   NUTRITIONAL SUPPLEMENT Liqd Take 1 each by mouth daily with lunch. *Magic Cup*   NUTRITIONAL DRINK Liqd Take 120 mLs by mouth 3 (three) times daily between meals. *Med Pass*   nystatin 100000 UNIT/ML suspension Commonly known as:  MYCOSTATIN Take 5 mLs (500,000 Units total) by mouth 4 (four) times daily.   polyethylene glycol packet Commonly known as:  MIRALAX / GLYCOLAX Take 17 g by mouth daily as needed for mild constipation. Mix in 8 oz liquid and drink   ranitidine 150 MG  tablet Commonly known as:  ZANTAC Take 150 mg by mouth at bedtime.   senna-docusate 8.6-50 MG tablet Commonly known as:  Senokot-S Take 2 tablets by mouth 2 (two) times daily.   tamsulosin 0.4 MG Caps capsule Commonly known as:  FLOMAX Take 0.4 mg by mouth at bedtime.   warfarin 1 MG tablet Commonly known as:  COUMADIN Take 1 tablet (1 mg total) by mouth daily at 6 PM. What changed:  medication strength  how much to take  when to take this  additional instructions  Another medication with the same name was removed. Continue taking this medication, and follow the directions you see here.     Relevant Imaging Results:  Relevant Lab Results:   Additional Information [id number]  Benard Halsted, LCSWA

## 2016-11-13 NOTE — Progress Notes (Signed)
Physical Therapy Treatment Patient Details Name: Kurt Thomas MRN: [id number] DOB: 07/06/1919 Today's Date: 11/13/2016    History of Present Illness  83 y.o. male with history of remote CVA, remote prostate CA, HTN, afib on Coumadin with difficulty maintaining appropriate INR, CAD, and Alzheimer's dementia presented from [facility] due to hypoxia, and respiratory distress.  He is wheelchair bound since the 2nd hip fracture, s/p IM nail 06/05/16. Dx of HCAP, sepsis, acute respiratory failure.     PT Comments    Pt sleepy during this AM session.  He may benefit from his sessions later in the day in the future.  Con't to recommend SNF.   Follow Up Recommendations  SNF     Equipment Recommendations  None recommended by PT    Recommendations for Other Services       Precautions / Restrictions Precautions Precautions: Fall Restrictions Weight Bearing Restrictions: No    Mobility  Bed Mobility Overal bed mobility: Needs Assistance Bed Mobility: Supine to Sit     Supine to sit: Max assist     General bed mobility comments: assisting pt to EOB, but then became resistive pointing to his R knee.  When asked if it hurt he nodded.  Attempted again to get pt to EOB, but pt motioning with his hands no. He then quickly fell asleep.  Transfers                    Ambulation/Gait                 Stairs            Wheelchair Mobility    Modified Rankin (Stroke Patients Only)       Balance                                            Cognition Arousal/Alertness: Lethargic Behavior During Therapy: WFL for tasks assessed/performed Overall Cognitive Status: History of cognitive impairments - at baseline                                 General Comments: Pt would awaken and would stay awake when spoken to and with mobility, but quickly falls asleep.      Exercises      General Comments General comments (skin integrity,  edema, etc.): Pt with strong cough when present and encouraged him to continue.      Pertinent Vitals/Pain Pain Assessment: Faces Faces Pain Scale: Hurts little more Pain Location: R knee Pain Descriptors / Indicators: Grimacing Pain Intervention(s): Limited activity within patient's tolerance;Repositioned    Home Living                      Prior Function            PT Goals (current goals can now be found in the care plan section) Acute Rehab PT Goals PT Goal Formulation: Patient unable to participate in goal setting Time For Goal Achievement: 11/24/16 Potential to Achieve Goals: Fair Progress towards PT goals: Not progressing toward goals - comment (Pt unable to get up to chair)    Frequency    Min 3X/week      PT Plan Current plan remains appropriate    Co-evaluation  AM-PAC PT "6 Clicks" Daily Activity  Outcome Measure  Difficulty turning over in bed (including adjusting bedclothes, sheets and blankets)?: Total Difficulty moving from lying on back to sitting on the side of the bed? : Total Difficulty sitting down on and standing up from a chair with arms (e.g., wheelchair, bedside commode, etc,.)?: Total Help needed moving to and from a bed to chair (including a wheelchair)?: A Lot Help needed walking in hospital room?: Total Help needed climbing 3-5 steps with a railing? : Total 6 Click Score: 7    End of Session Equipment Utilized During Treatment: Oxygen Activity Tolerance: Patient limited by lethargy;Patient limited by fatigue;Patient limited by pain Patient left: in bed;with call bell/phone within reach;with bed alarm set (low bed with mats) Nurse Communication: Mobility status PT Visit Diagnosis: Muscle weakness (generalized) (M62.81)     Time: 8280-0349 PT Time Calculation (min) (ACUTE ONLY): 20 min  Charges:  $Therapeutic Activity: 8-22 mins                    G Codes:       Taleya Whitcher L. Tamala Julian, [place] Pager  [phone] 11/13/2016    Galen [place] 11/13/2016, 10:07 AM

## 2016-11-13 NOTE — Clinical Social Work Note (Signed)
Clinical Social Work Assessment  Patient Details  Name: Newell Wafer MRN: [id number] Date of Birth: 1919-04-29  Date of referral:  11/13/16               Reason for consult:  Facility Placement                Permission sought to share information with:  Facility Sport and exercise psychologist, Family Supports Permission granted to share information::  No  Name::     Community education officer::  ALF  Relationship::  Daughter  Contact Information:  [phone]  Housing/Transportation Living arrangements for the past 2 months:  [place] of Information:  Adult Children Patient Interpreter Needed:  None Criminal Activity/Legal Involvement Pertinent to Current Situation/Hospitalization:  No - Comment as needed Significant Relationships:  Adult Children Lives with:  Facility Resident Do you feel safe going back to the place where you live?  Yes Need for family participation in patient care:  Yes (Comment)  Care giving concerns:  CSW received consult regarding discharge planning. Patient is disoriented. CSW spoke with patient's daughter. She reported that patient resides at [facility] and will return there at discharge. CSW to continue to follow and assist with discharge planning needs.   Social Worker assessment / plan:  CSW spoke with patient's daughter regarding discharge plan.  Employment status:  Retired Forensic scientist:  Medicare PT Recommendations:  [place] / Referral to community resources:  [place]  Patient/Family's Response to care:  Patient's daughter reports agreement with discharge back to ALF. She would like patient to be transported by [facility].   Patient/Family's Understanding of and Emotional Response to Diagnosis, Current Treatment, and Prognosis:  Patient/family is realistic regarding therapy needs and expressed being hopeful for ALF placement. Patient's daughter expressed understanding of CSW role and  discharge process. She asked CSW if patient required oxygen. CSW confirmed with RN that patient will not require oxygen at ALF. No further questions/concerns about plan or treatment.    Emotional Assessment Appearance:  Appears stated age Attitude/Demeanor/Rapport:  Unable to Assess Affect (typically observed):  Unable to Assess Orientation:  Oriented to Self Alcohol / Substance use:  Not Applicable Psych involvement (Current and /or in the community):  No (Comment)  Discharge Needs  Concerns to be addressed:  Care Coordination Readmission within the last 30 days:  No Current discharge risk:  None Barriers to Discharge:  No Barriers Identified   Benard Halsted, [place] 11/13/2016, 10:57 AM

## 2016-11-13 NOTE — Discharge Summary (Signed)
Physician Discharge Summary  Kurt Thomas WGY:[id number] DOB: 03/09/1919 DOA: 11/08/2016  PCP: Kurt Cruel, MD  Admit date: 11/08/2016 Discharge date: 11/13/2016  Admitted From: [facility]  (wheelchair bound prior to admission) Disposition:  [facility]   Recommendations for Outpatient Follow-up:  1. Palliative care consult 2. Recommend warfarin 1mg  nightly and repeat INR on Tuesday 3. PT/OT and SLP 4. Continue Augmentin and guaifenesin for 5 more days 5. Encourage flutter valve/incentive spirometry 6. Duonebs with prn albuterol until improved 7. Strict aspiration precautions as below.  No straws please.  Equipment/Devices:  Already wheelchair dependent.  NEW:  2L O2  Discharge Condition:  Stable, improved CODE STATUS:  DNR/DNI  Diet recommendation:  Dysphagia 3 with thin liquids   Liquid Administration via: Cup;No straw;Spoon Medication Administration: Crushed with puree (liquid medications thickened to nectar) Supervision: Full supervision/cueing for compensatory strategies;Staff to assist with self feeding Compensations: Slow rate;Small sips/bites;Multiple dry swallows after each bite/sip;Clear throat after each swallow Postural Changes: Seated upright at 90 degrees;Remain upright for at least 30 minutes after po intake   Brief/Interim Summary:  Kurt Thomas a 83 y.o.malewith history of remote CVA, remote prostate CA, HTN, afib on Coumadin with difficulty maintaining appropriate INR, CAD, and Alzheimer's dementia presented from [facility] due to hypoxia, and respiratory distress.  He is wheelchair bound since the 2nd hip fracture in Nov/Dec of last year.  He had a CT the day prior to admission which confirmed a slow growing RCC.  In the ER, he was in respiratory distress and placed on bipap.  CXR was concerning for pneumonia so he was started on antibiotics for HCAP.  He was transitioned off bipap the morning of 5/3 and transitioned out of SDU the same day. Also,  his INR was 7.6 on admission and rose so he was given one dose of vitamin K and his INR has trended down.  Repeat CXR this morning demonstrates improved opacities.  Case discussed with daughter who reports that patient has been aspirating and on aspiration precautions at SNF.  His symptoms started after drinking cola through a straw.  He is normally on thickened liquids.  Changed antibiotics to unasyn for likely aspiration.  He was evaluated by SLP here who recommended GI evaluation, but family elected no further interventions and preferred a comfort approach/palliative dysphagia 3 diet with thin liquids.  Patient and family would like him to return to [facility] as soon as possible.    Discharge Diagnoses:  Principal Problem:   Aspiration pneumonia ([place]) Active Problems:   Atrial fibrillation (HCC)   CKD (chronic kidney disease) stage 3, GFR 30-59 ml/min   Chronic anticoagulation   HCAP (healthcare-associated pneumonia)   Sepsis ([place])   Acute respiratory failure (HCC)   Hyperglycemia   Renal mass   Dementia  Acute respiratory failure with sepsis resulting from aspiration pneumonia (vs HCAP) with right greater than left infiltrates on CXR.   - Leukocytosis resolved  - Given unasyn during hospitalization -  Transition to augmentin at discharge  -  Blood culture NGTD -  Aspiration precautions to continue at SNF.   -  SLP recommended GI consultation, however, family would like to minimize further testing and allow Kurt Thomas to eat.   -  We were unable to transition to room air.  O2 sat dropped to 83% on room air at rest.  Maintained O2 sat in the 90s on 2L [place].  Weak cough, poor prognostic sign but cough improved during stay -  Continue flutter  valve, incentive spirometry   Afib on Coumadin, CHA2DS2-VASc score appears to be 6, rate controlled with supratherapeutic INR on admission - INR rose to 8.3 on 5/3 so given vitamin K once on 5/3 - No activebleeding  - Troponin 0 -  INR on  date of discharge 3.02. -  Recommend warfarin 1mg  nightly and repeat INR on Tuesday  CKD stage 3 and slow progressive RCC, creatinine stable at 1.6 -The patient gets very upset with the word "cancer" so please avoid saying it in front of him if possible -With this current cancer, he appears to be more likely to die with it than from it based on its slow rate of growth   Hyperglycemia, likely stress response, improved  Dementia -Family reports overall good quality of life, which is their goal for him at this time -He enjoys visiting with friends and is quite social, despite his inability to walk -His family does not wish to escalate his care further at this time  CAD, chest pain free -  No aspirin due to elevated INR -  Continue bidil, carvedilol  Hypertension, blood pressure rising as he recovers from acute illness -  Continue norvasc -  Defer further blood pressure management to primary physician  Iron deficiency anemia, chronic, hemoglobin trending down slightly but may be hemodilutional -  Continue iron supplementation  Discharge Instructions  Discharge Instructions    Diet general    Complete by:  As directed    Increase activity slowly    Complete by:  As directed        Medication List    STOP taking these medications   diphenhydrAMINE 12.5 MG/5ML syrup Commonly known as:  BENYLIN   docusate sodium 100 MG capsule Commonly known as:  COLACE   feeding supplement (PRO-STAT SUGAR FREE 64) Liqd   HYDROcodone-acetaminophen 5-325 MG tablet Commonly known as:  NORCO/VICODIN   loperamide 2 MG capsule Commonly known as:  IMODIUM   neomycin-bacitracin-polymyxin ointment Commonly known as:  NEOSPORIN   ondansetron 4 MG tablet Commonly known as:  ZOFRAN   UNABLE TO FIND     TAKE these medications   albuterol (2.5 MG/3ML) 0.083% nebulizer solution Commonly known as:  PROVENTIL Take 2.5 mg by nebulization every 6 (six) hours as needed for wheezing.    amLODipine 10 MG tablet Commonly known as:  NORVASC Take 1 tablet (10 mg total) by mouth daily.   amoxicillin-clavulanate 500-125 MG tablet Commonly known as:  AUGMENTIN Take 1 tablet (500 mg total) by mouth 2 (two) times daily.   brimonidine-timolol 0.2-0.5 % ophthalmic solution Commonly known as:  COMBIGAN Place 1 drop into both eyes 2 (two) times daily.   CALMOSEPTINE 0.44-20.6 % Oint Generic drug:  Menthol-Zinc Oxide Apply 1 application topically See admin instructions. TO BUTTOCKS ONCE A DAY PREVENTATIVELY   carvedilol 12.5 MG tablet Commonly known as:  COREG Take 12.5 mg by mouth every morning.   DECUBI-VITE PO Take 1 tablet by mouth daily.   DULoxetine 30 MG capsule Commonly known as:  CYMBALTA Take 30 mg by mouth daily.   ferrous sulfate 325 (65 FE) MG tablet Take 325 mg by mouth daily.   fluticasone 50 MCG/ACT nasal spray Commonly known as:  FLONASE Place 2 sprays into both nostrils at bedtime.   guaiFENesin 600 MG 12 hr tablet Commonly known as:  MUCINEX Take 1 tablet (600 mg total) by mouth 2 (two) times daily.   ipratropium-albuterol 0.5-2.5 (3) MG/3ML Soln Commonly known as:  DUONEB Take  3 mLs by nebulization every 6 (six) hours as needed.   isosorbide-hydrALAZINE 20-37.5 MG tablet Commonly known as:  BIDIL Take 1 tablet by mouth 2 (two) times daily.   latanoprost 0.005 % ophthalmic solution Commonly known as:  XALATAN Place 1 drop into both eyes at bedtime.   mirtazapine 15 MG tablet Commonly known as:  REMERON Take 15 mg by mouth at bedtime.   MYRBETRIQ 25 MG Tb24 tablet Generic drug:  mirabegron ER Take 25 mg by mouth daily with breakfast.   NUTRITIONAL SUPPLEMENT Liqd Take 1 each by mouth daily with lunch. *Magic Cup*   NUTRITIONAL DRINK Liqd Take 120 mLs by mouth 3 (three) times daily between meals. *Med Pass*   nystatin 100000 UNIT/ML suspension Commonly known as:  MYCOSTATIN Take 5 mLs (500,000 Units total) by mouth 4 (four)  times daily.   polyethylene glycol packet Commonly known as:  MIRALAX / GLYCOLAX Take 17 g by mouth daily as needed for mild constipation. Mix in 8 oz liquid and drink   ranitidine 150 MG tablet Commonly known as:  ZANTAC Take 150 mg by mouth at bedtime.   senna-docusate 8.6-50 MG tablet Commonly known as:  Senokot-S Take 2 tablets by mouth 2 (two) times daily.   tamsulosin 0.4 MG Caps capsule Commonly known as:  FLOMAX Take 0.4 mg by mouth at bedtime.   warfarin 1 MG tablet Commonly known as:  COUMADIN Take 1 tablet (1 mg total) by mouth daily at 6 PM. What changed:  medication strength  how much to take  when to take this  additional instructions  Another medication with the same name was removed. Continue taking this medication, and follow the directions you see here.      Follow-up Information    Kurt Cruel, MD. Schedule an appointment as soon as possible for a visit in 1 week(s).   Specialty:  Family Medicine Contact information: [place] [address] [phone]          No Known Allergies  Consultations: none   Procedures/Studies: Ct Abdomen Pelvis W Contrast  Result Date: 11/07/2016 CLINICAL DATA:  Weight loss, diarrhea, occasional melena.  Dementia. EXAM: CT ABDOMEN AND PELVIS WITH CONTRAST TECHNIQUE: Multidetector CT imaging of the abdomen and pelvis was performed using the standard protocol following bolus administration of intravenous contrast. CONTRAST:  76mL ISOVUE-300 IOPAMIDOL (ISOVUE-300) INJECTION 61% Creatinine was obtained on site at [facility] at [address] Results: Creatinine 1.6 mg/dL. COMPARISON:  Multiple exams, including pelvic radiographs of 09/21/2016 and abdominal ultrasound 09/04/2014 FINDINGS: Lower chest: Cardiomegaly, with four-chamber enlargement but especially of the right atrium. Epicardial pacer leads. Loculated right pleural effusion and more free-flowing left pleural effusion  with some pleural enhancement is specially on the right, and right pleural thickening. Calcifications along the aortic and mitral valve. Hepatobiliary: Mild heterogeneity on the initial postcontrast series in the hepatic parenchyma is ascribed to the early phase of contrast. No focal hepatic lesion is identified. No biliary dilatation. Pancreas: Suspected 1.1 by 0.8 cm cystic lesion of the tail the pancreas on image 26/3. Spleen: Unremarkable Adrenals/Urinary Tract: Adrenal glands normal. 5.8 by 4.7 by 5.4 cm (volume = 77 cm^3) solid enhancing mass in the left kidney upper pole extending into the left renal hilum, with central necrosis or central scar observed, and lobulations along the inferior margin of this mass. Notably, a similar size mass was identified in this region on the abdominal ultrasound from 09/04/2014. The mass splays branches of  the renal artery. No definite tumor thrombus is seen in the renal vein or IVC. Multiple hypodense renal lesions are likely cysts, and are larger on the left than the right. Urinary bladder unremarkable although partially obscured by streak artifact related to the pelvic vascular clips and bilateral hip fixation. Stomach/Bowel: There is thickening of some of the stomach wall including the gastric cardia and antrum although much of this may be related to nondistention rather than actual pathologic thickening. Mild prominence of stool in the rectal vault. Sigmoid colon diverticulosis without active diverticulitis. 0.9 by 1.2 cm filling defect in the mid transverse colon on image 29/601 could represent a colonic polyp, and appears more solid than the rest of the scattered stool in the colon. Vascular/Lymphatic: Aortoiliac atherosclerotic vascular disease. Infrarenal fusiform abdominal aortic ectasia up to 2.9 cm diameter, image 42/3. No pathologic adenopathy identified. Reproductive: Prostatectomy. The prostate bed is obscured by streak artifact from the hip implants. Other:  Low-level presacral edema. Subcutaneous edema noted along the buttocks and perineum. Musculoskeletal: Callus formation in late healing response of right anterior seventh, eighth, ninth, and tenth rib fractures. Right hip screw.  Left hip hemiarthroplasty. Lumbar spondylosis and degenerative disc disease along with somewhat congenitally Rayhaan Huster pedicles, contributing to impingement at L3-4, L4-5, and L5-S 1. IMPRESSION: 1. 77 cubic cm solid enhancing mass in the left kidney upper pole, without much in the way of growth compared to prior ultrasound exam from 09/04/2014, probably a slow growing renal cell carcinoma, less likely but possibly oncocytoma. No current adenopathy or tumor thrombus in the left renal vein. 2. Considerable cardiomegaly, with loculated right pleural effusion and pleural thickening, and more free-flowing left pleural effusion but with some mild pleural enhancement. Accordingly exudative effusions are suspected. 3. 1.1 cm cystic lesion in the tail the pancreas. Based on the patient' s age I do not feel that this warrants further workup. 4. Potential 1.2 cm polyp in the mid transverse colon, image 29/601. 5. Aortic Atherosclerosis (ICD10-I70.0). Infrarenal abdominal aortic ectasia at 2.9 cm diameter. 6. Low-level presacral edema and subcutaneous edema along the pelvis, uncertain significance. 7. Late phase healing response of several right anterior lower rib fractures. 8. Multilevel impingement in the lower lumbar spine due to spondylosis and degenerative disc disease. Electronically Signed   By: Van Clines M.D.   On: 11/07/2016 14:01   Dg Chest Port 1 View  Result Date: 11/10/2016 CLINICAL DATA:  Shortness of breath . EXAM: PORTABLE CHEST 1 VIEW COMPARISON:  11/08/2016 . FINDINGS: Prior CABG. Stable cardiomegaly. Persistent but improving bilateral pulmonary interstitial infiltrates/edema. Findings consistent with improving CHF. Small bilateral pleural effusions. No pneumothorax . Barium  is noted in the colon. IMPRESSION: 1.  Prior CABG. 2. Cardiomegaly with bilateral pulmonary interstitial prominence and bilateral pleural effusions consistent with CHF. Pulmonary interstitial edema has improved from prior exam. Electronically Signed   By: Marcello Moores  Register   On: 11/10/2016 07:55   Dg Chest Port 1 View  Result Date: 11/08/2016 CLINICAL DATA:  Shortness of Breath EXAM: PORTABLE CHEST 1 VIEW COMPARISON:  07/24/2016 FINDINGS: Cardiac shadow is mildly enlarged but stable. Postsurgical changes are again seen the lungs are well aerated bilaterally. Diffuse infiltrate is noted throughout the right lung and to a lesser degree in the left lung base. No bony abnormality is noted. IMPRESSION: Bilateral infiltrates right greater than left. Electronically Signed   By: Inez Catalina M.D.   On: 11/08/2016 19:25    Subjective: Still coughing up sputum but denies SOB.  Denies  pain, nausea.   Discharge Exam: Vitals:   11/12/16 2059 11/13/16 0507  BP: (!) 143/76 (!) 160/74  Pulse: 94 93  Resp: 18 18  Temp: 98.1 F (36.7 C)    Vitals:   11/12/16 2059 11/13/16 0507 11/13/16 0530 11/13/16 0840  BP: (!) 143/76 (!) 160/74    Pulse: 94 93    Resp: 18 18    Temp: 98.1 F (36.7 C)     TempSrc: Oral     SpO2: 90% (!) 83% 95% 96%  Weight:      Height:       General exam:  Adult male, thin, lying in bed, smiling.  subtle SCM retractions at rest with obvious rhonchi HEENT:  NCAT, MMM Respiratory system:   Diminished at the bilateral bases with copious rhonchi through, no focal rales or wheeze Cardiovascular system:  IRRR.  3/6 systolic murmur throughout precordium.  Warm extremities Gastrointestinal system: Normal active bowel sounds, scaphoid, soft, nondistended, nontender. MSK:  Normal tone and bulk, no lower extremity edema Neuro:  Grossly moves all extremities   The results of significant diagnostics from this hospitalization (including imaging, microbiology, ancillary and laboratory) are  listed below for reference.     Microbiology: Recent Results (from the past 240 hour(s))  MRSA PCR Screening     Status: Abnormal   Collection Time: 11/08/16 10:53 PM  Result Value Ref Range Status   MRSA by PCR POSITIVE (A) NEGATIVE Final    Comment:        The GeneXpert MRSA Assay (FDA approved for NASAL specimens only), is one component of a comprehensive MRSA colonization surveillance program. It is not intended to diagnose MRSA infection nor to guide or monitor treatment for MRSA infections. RESULT CALLED TO, READ BACK BY AND VERIFIED WITH: S.CORIEA,RN AT 0331   Culture, blood (routine x 2) Call MD if unable to obtain prior to antibiotics being given     Status: None (Preliminary result)   Collection Time: 11/08/16 11:45 PM  Result Value Ref Range Status   Specimen Description BLOOD LEFT HAND  Final   Special Requests   Final    BOTTLES DRAWN AEROBIC AND ANAEROBIC Blood Culture adequate volume   Culture NO GROWTH 3 DAYS  Final   Report Status PENDING  Incomplete  Culture, blood (routine x 2) Call MD if unable to obtain prior to antibiotics being given     Status: None (Preliminary result)   Collection Time: 11/08/16 11:55 PM  Result Value Ref Range Status   Specimen Description BLOOD LEFT ANTECUBITAL  Final   Special Requests   Final    BOTTLES DRAWN AEROBIC AND ANAEROBIC Blood Culture adequate volume   Culture NO GROWTH 3 DAYS  Final   Report Status PENDING  Incomplete     Labs: BNP (last 3 results)  Recent Labs  11/08/16 1845  BNP 176.1*   Basic Metabolic Panel:  Recent Labs Lab 11/08/16 1845 11/09/16 0346 11/10/16 0432 11/11/16 1155 11/12/16 0335  NA 137 136 138 140 143  K 4.6 4.8 4.5 4.6 4.5  CL 105 106 106 110 110  CO2 22 21* 22 21* 23  GLUCOSE 120* 124* 100* 135* 93  BUN 33* 34* 37* 41* 42*  CREATININE 1.63* 1.67* 1.64* 1.54* 1.55*  CALCIUM 9.2 8.7* 8.7* 8.8* 9.3   Liver Function Tests:  Recent Labs Lab 11/08/16 1845  AST 17  ALT 14*   ALKPHOS 100  BILITOT 0.5  PROT 6.2*  ALBUMIN 3.5   No  results for input(s): LIPASE, AMYLASE in the last 168 hours. No results for input(s): AMMONIA in the last 168 hours. CBC:  Recent Labs Lab 11/08/16 1845 11/09/16 0346 11/10/16 0432 11/11/16 1155 11/12/16 0335  WBC 12.8* 14.5* 7.0 7.9 7.0  NEUTROABS 11.4* 12.7*  --   --   --   HGB 9.5* 8.0* 8.0* 7.7* 8.2*  HCT 30.5* 25.1* 25.1* 24.6* 25.7*  MCV 94.1 93.7 95.4 95.3 94.8  PLT 262 185 172 186 197   Cardiac Enzymes: No results for input(s): CKTOTAL, CKMB, CKMBINDEX, TROPONINI in the last 168 hours. BNP: Invalid input(s): POCBNP CBG: No results for input(s): GLUCAP in the last 168 hours. D-Dimer No results for input(s): DDIMER in the last 72 hours. Hgb A1c No results for input(s): HGBA1C in the last 72 hours. Lipid Profile No results for input(s): CHOL, HDL, LDLCALC, TRIG, CHOLHDL, LDLDIRECT in the last 72 hours. Thyroid function studies No results for input(s): TSH, T4TOTAL, T3FREE, THYROIDAB in the last 72 hours.  Invalid input(s): FREET3 Anemia work up No results for input(s): VITAMINB12, FOLATE, FERRITIN, TIBC, IRON, RETICCTPCT in the last 72 hours. Urinalysis    Component Value Date/Time   COLORURINE YELLOW 07/24/2016 1554   APPEARANCEUR HAZY (A) 07/24/2016 1554   LABSPEC 1.015 07/24/2016 1554   PHURINE 5.0 07/24/2016 1554   GLUCOSEU NEGATIVE 07/24/2016 1554   HGBUR MODERATE (A) 07/24/2016 1554   BILIRUBINUR NEGATIVE 07/24/2016 1554   KETONESUR NEGATIVE 07/24/2016 1554   PROTEINUR NEGATIVE 07/24/2016 1554   UROBILINOGEN 0.2 09/03/2014 2025   NITRITE NEGATIVE 07/24/2016 1554   LEUKOCYTESUR LARGE (A) 07/24/2016 1554   Sepsis Labs Invalid input(s): PROCALCITONIN,  WBC,  LACTICIDVEN   Time coordinating discharge: Over 30 minutes  SIGNED:   Janece Canterbury, MD  [facility] 11/13/2016, 10:08 AM Pager   If 7PM-7AM, please contact night-coverage [email or web address] Password [email or web address]

## 2016-11-14 DIAGNOSIS — J690 Pneumonitis due to inhalation of food and vomit: Secondary | ICD-10-CM | POA: Diagnosis not present

## 2016-11-14 DIAGNOSIS — I10 Essential (primary) hypertension: Secondary | ICD-10-CM | POA: Diagnosis not present

## 2016-11-14 DIAGNOSIS — I4891 Unspecified atrial fibrillation: Secondary | ICD-10-CM | POA: Diagnosis not present

## 2016-11-14 DIAGNOSIS — Z7901 Long term (current) use of anticoagulants: Secondary | ICD-10-CM | POA: Diagnosis not present

## 2016-11-14 LAB — CULTURE, BLOOD (ROUTINE X 2)
CULTURE: NO GROWTH
CULTURE: NO GROWTH
SPECIAL REQUESTS: ADEQUATE
SPECIAL REQUESTS: ADEQUATE

## 2016-11-16 DIAGNOSIS — R630 Anorexia: Secondary | ICD-10-CM | POA: Diagnosis not present

## 2016-11-16 DIAGNOSIS — I679 Cerebrovascular disease, unspecified: Secondary | ICD-10-CM | POA: Diagnosis not present

## 2016-11-16 DIAGNOSIS — R1310 Dysphagia, unspecified: Secondary | ICD-10-CM | POA: Diagnosis not present

## 2016-11-16 DIAGNOSIS — H409 Unspecified glaucoma: Secondary | ICD-10-CM | POA: Diagnosis not present

## 2016-11-16 DIAGNOSIS — F0150 Vascular dementia without behavioral disturbance: Secondary | ICD-10-CM | POA: Diagnosis not present

## 2016-11-16 DIAGNOSIS — C61 Malignant neoplasm of prostate: Secondary | ICD-10-CM | POA: Diagnosis not present

## 2016-11-16 DIAGNOSIS — I10 Essential (primary) hypertension: Secondary | ICD-10-CM | POA: Diagnosis not present

## 2016-11-16 DIAGNOSIS — I672 Cerebral atherosclerosis: Secondary | ICD-10-CM | POA: Diagnosis not present

## 2016-11-16 DIAGNOSIS — F339 Major depressive disorder, recurrent, unspecified: Secondary | ICD-10-CM | POA: Diagnosis not present

## 2016-11-16 DIAGNOSIS — I25810 Atherosclerosis of coronary artery bypass graft(s) without angina pectoris: Secondary | ICD-10-CM | POA: Diagnosis not present

## 2016-11-16 DIAGNOSIS — R634 Abnormal weight loss: Secondary | ICD-10-CM | POA: Diagnosis not present

## 2016-11-16 DIAGNOSIS — I4891 Unspecified atrial fibrillation: Secondary | ICD-10-CM | POA: Diagnosis not present

## 2016-11-16 DIAGNOSIS — K219 Gastro-esophageal reflux disease without esophagitis: Secondary | ICD-10-CM | POA: Diagnosis not present

## 2016-11-16 DIAGNOSIS — D4110 Neoplasm of uncertain behavior of unspecified renal pelvis: Secondary | ICD-10-CM | POA: Diagnosis not present

## 2016-11-16 DIAGNOSIS — J449 Chronic obstructive pulmonary disease, unspecified: Secondary | ICD-10-CM | POA: Diagnosis not present

## 2016-11-16 DIAGNOSIS — J309 Allergic rhinitis, unspecified: Secondary | ICD-10-CM | POA: Diagnosis not present

## 2016-11-16 DIAGNOSIS — N183 Chronic kidney disease, stage 3 (moderate): Secondary | ICD-10-CM | POA: Diagnosis not present

## 2016-11-17 DIAGNOSIS — I679 Cerebrovascular disease, unspecified: Secondary | ICD-10-CM | POA: Diagnosis not present

## 2016-11-17 DIAGNOSIS — N183 Chronic kidney disease, stage 3 (moderate): Secondary | ICD-10-CM | POA: Diagnosis not present

## 2016-11-17 DIAGNOSIS — I25810 Atherosclerosis of coronary artery bypass graft(s) without angina pectoris: Secondary | ICD-10-CM | POA: Diagnosis not present

## 2016-11-17 DIAGNOSIS — I4891 Unspecified atrial fibrillation: Secondary | ICD-10-CM | POA: Diagnosis not present

## 2016-11-17 DIAGNOSIS — I672 Cerebral atherosclerosis: Secondary | ICD-10-CM | POA: Diagnosis not present

## 2016-11-17 DIAGNOSIS — F0150 Vascular dementia without behavioral disturbance: Secondary | ICD-10-CM | POA: Diagnosis not present

## 2016-12-08 DEATH — deceased

## 2018-05-24 IMAGING — CT CT HEAD W/O CM
6 of 9 series · 16 of 47 positions shown, 17 images · non-contrast
Comparison: Brain CT 03/21/2016.

CLINICAL DATA: Patient status post fall. No reported loss of
consciousness. Head neck pain.

EXAM:
CT HEAD WITHOUT CONTRAST
CT CERVICAL SPINE WITHOUT CONTRAST
TECHNIQUE: Multidetector CT imaging of the head and cervical spine was
performed following the standard protocol without intravenous
contrast. Multiplanar CT image reconstructions of the cervical spine
were also generated.

[Series 3: head without · axial · non-contrast · 0.42mm/px · z∈[-152,-97]mm · 2 of 33 slices shown]
[im 11/33  brain]
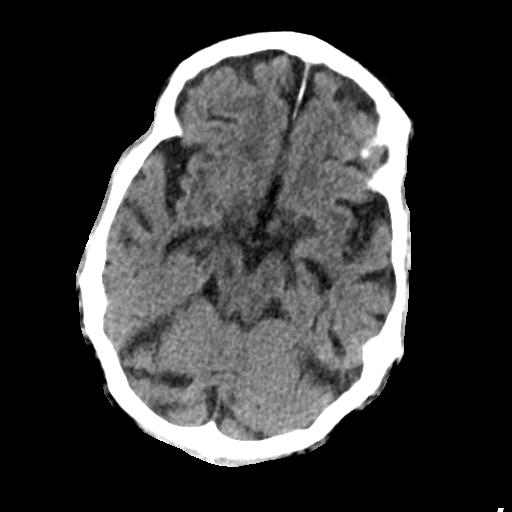
[im 22/33  brain]
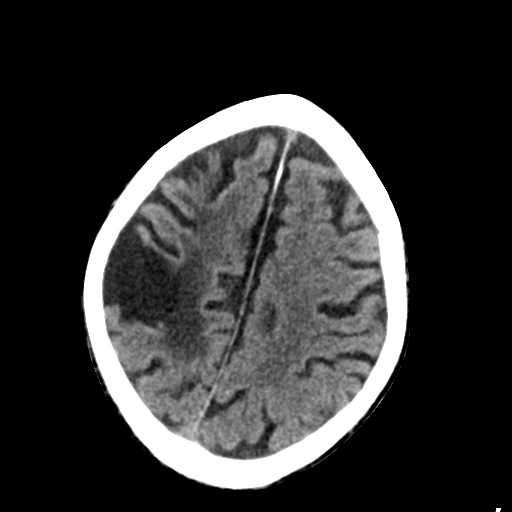

[Series 4: head bone · axial · 0.42mm/px · z∈[-170,-74]mm · 4 of 81 slices shown]
[im 17/81  bone]
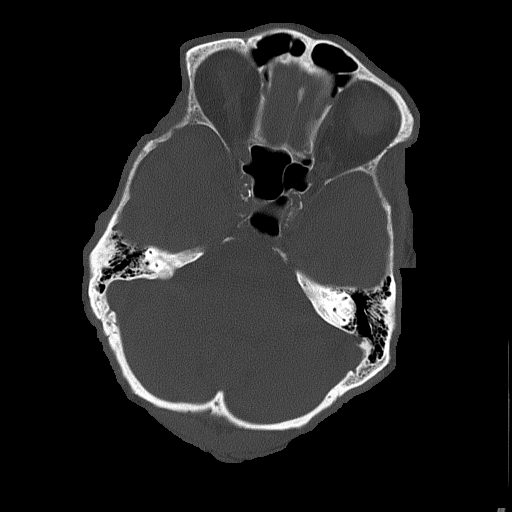
[im 33/81  bone]
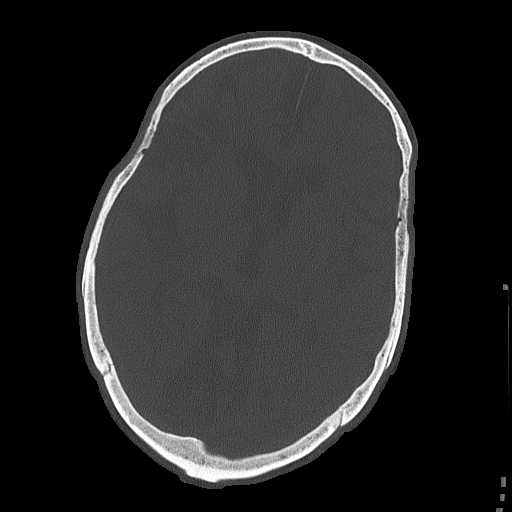
[im 49/81  bone]
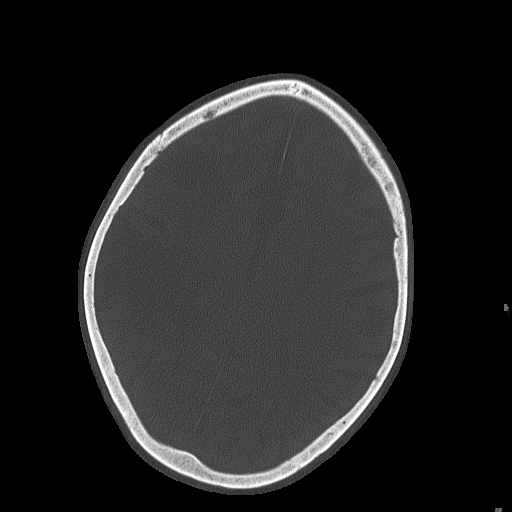
[im 65/81  bone]
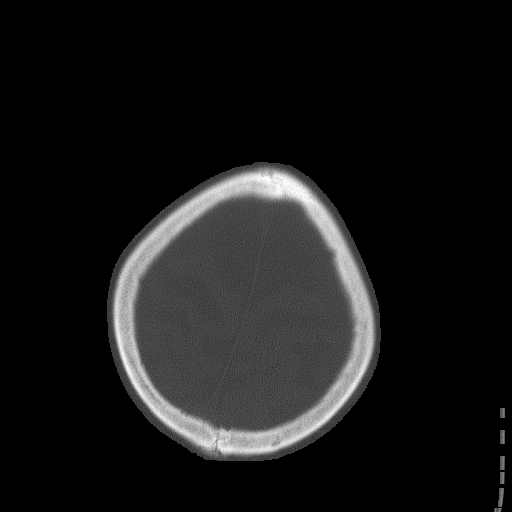

[Series 5: head 2mm · axial · 0.42mm/px · z∈[-154,-64]mm · 4 of 81 slices shown, 5 images]
[im 17/81  brain]
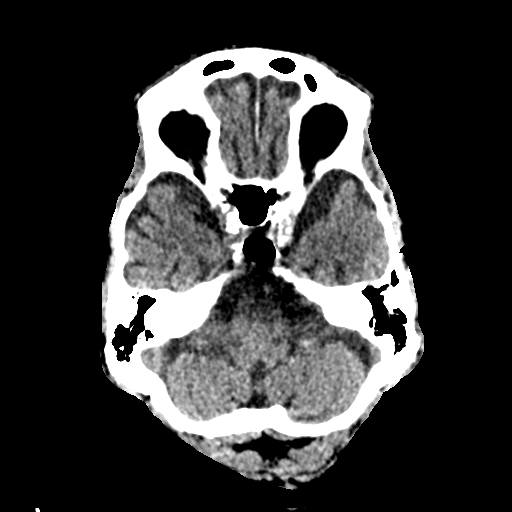
[im 17/81  bone]
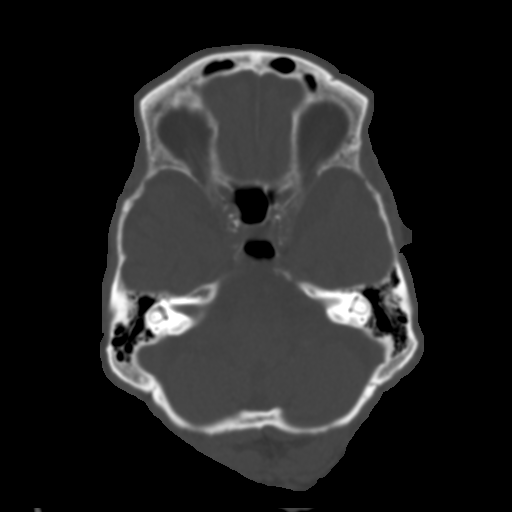
[im 33/81  brain]
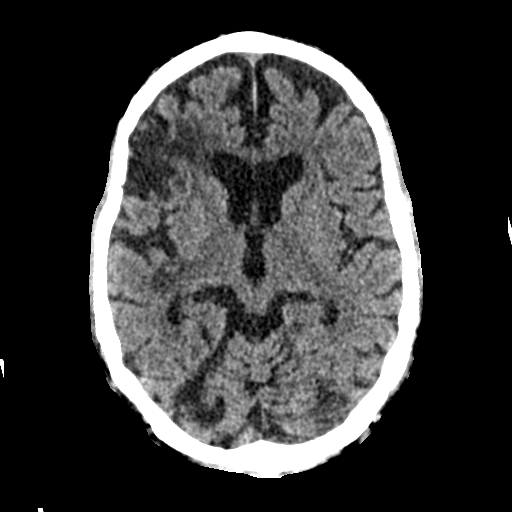
[im 49/81  brain]
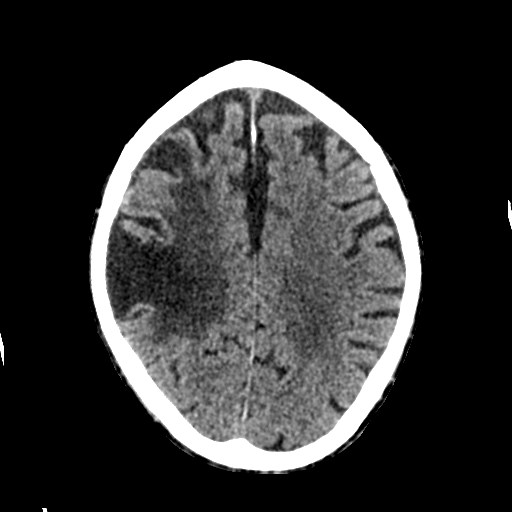
[im 65/81  brain]
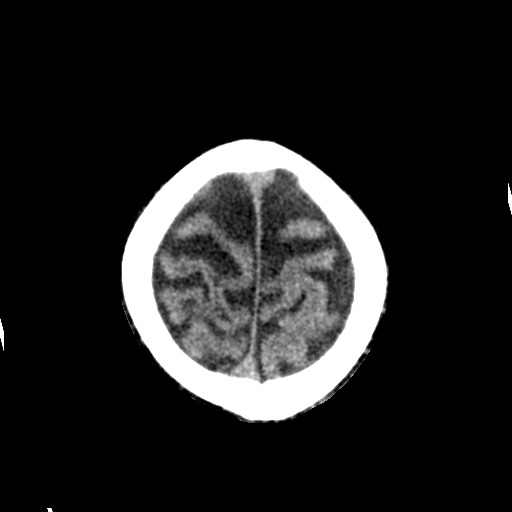

[Series 11: head without sag · sagittal · non-contrast · 0.31mm/px · 1 of 50 slices shown]
[im 25/50  brain]
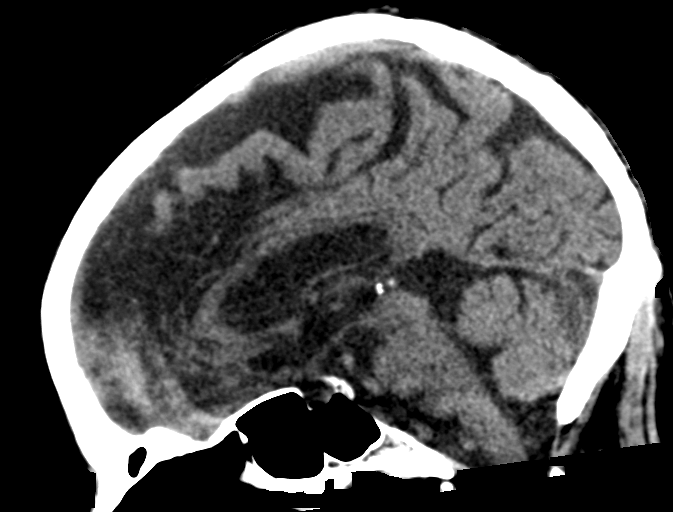

[Series 13: c_spine 2.0 cor bone · coronal · 0.23mm/px · 3 of 68 slices shown]
[im 20/68  brain]
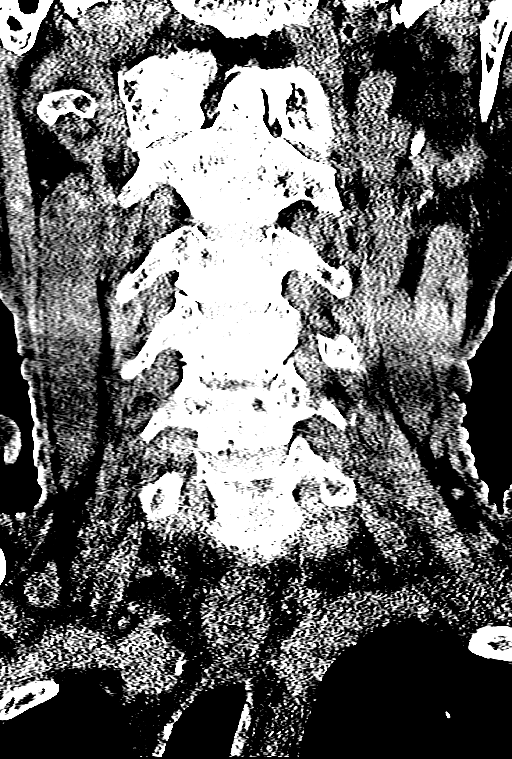
[im 29/68  brain]
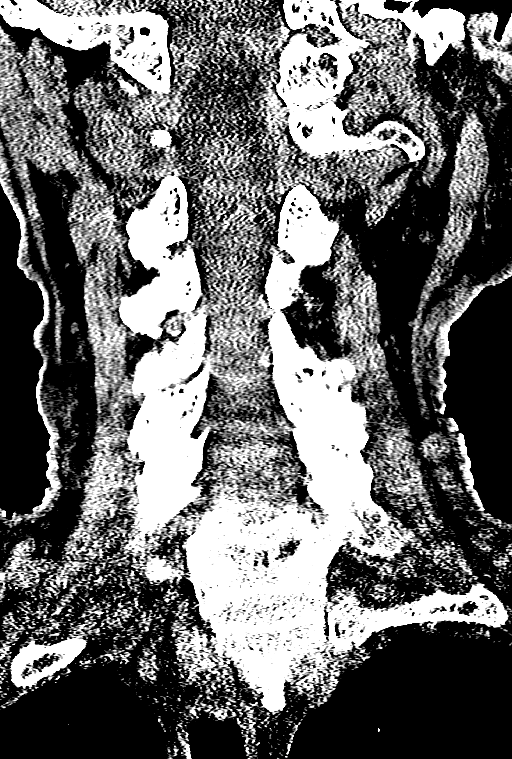
[im 39/68  brain]
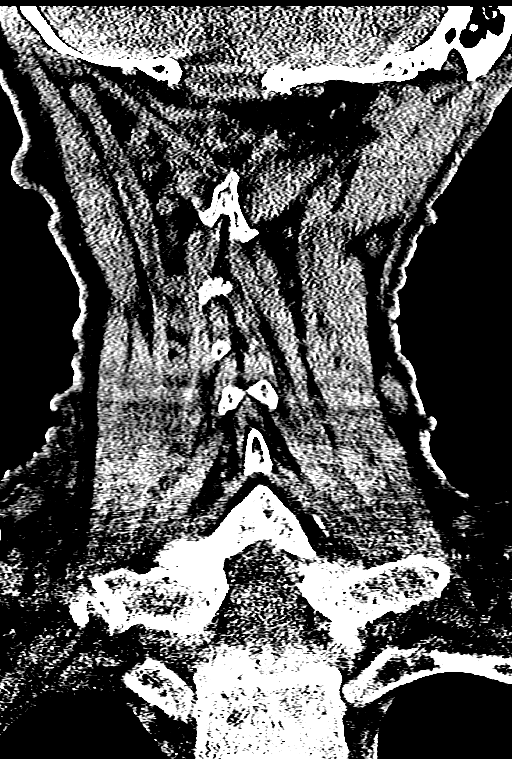

[Series 14: c_spine 2.0 orthogonals · oblique · 0.21mm/px · 2 of 91 slices shown]
[im 16/91  brain]
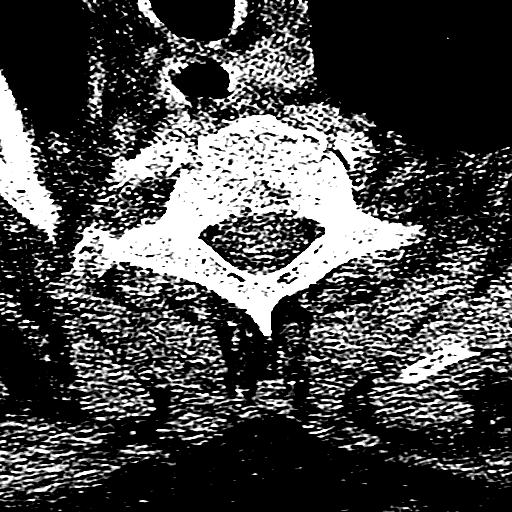
[im 31/91  brain]
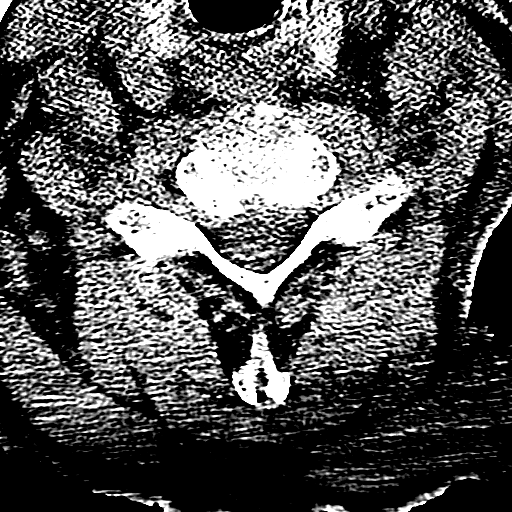

[16 of 47 positions shown; findings below may reference images not displayed]

FINDINGS: CT HEAD FINDINGS

Brain: Ventricles and sulci are prominent compatible with atrophy.
Periventricular and subcortical white matter hypodensity compatible
with chronic microvascular ischemic changes. Additionally
encephalomalacia is demonstrated throughout the right cerebral
hemisphere compatible with remote infarct. Old right cerebellar
hemisphere infarct. No evidence for acute cortically based infarct,
intracranial hemorrhage, mass lesion or mass-effect. Old left basal
ganglia lacunar infarct.

Vascular: Internal carotid arterial calcifications

Skull: Intact.

Sinuses/Orbits: Paranasal sinuses are well aerated. Mastoid air
cells are unremarkable.

Other: None.

CT CERVICAL SPINE FINDINGS

Alignment: Normal anatomic alignment.

Skull base and vertebrae: Irregular lucency through the right aspect
of the C1 ring (image 17; series 7).

Soft tissues and spinal canal: Unremarkable.

Disc levels: Multilevel degenerative disc disease most pronounced
C3-4, C4-5 and C5-6. No acute cervical spine fracture.

Upper chest: No consolidative pulmonary opacities. There is a 1.9 cm
nodule within the thyroid (image 85; series 6).

Other: None.
IMPRESSION: Irregular lucency through the posterior right aspect of the C1 ring
likely sequelae of remote trauma or potentially secondary to a lytic
lesion in the setting of metastatic disease, felt to be less likely.

No acute intracranial process.

No definite acute cervical spine fracture.

Cortical atrophy and old cortically based infarcts.

## 2018-05-24 IMAGING — CR DG CHEST 1V
1 series · 1 of 1 positions shown · non-contrast
Comparison: 06/20/2014.

CLINICAL DATA: Fall, right hip pain, right femur fracture, initial
encounter.

EXAM:
CHEST 1 VIEW

[chest pa]
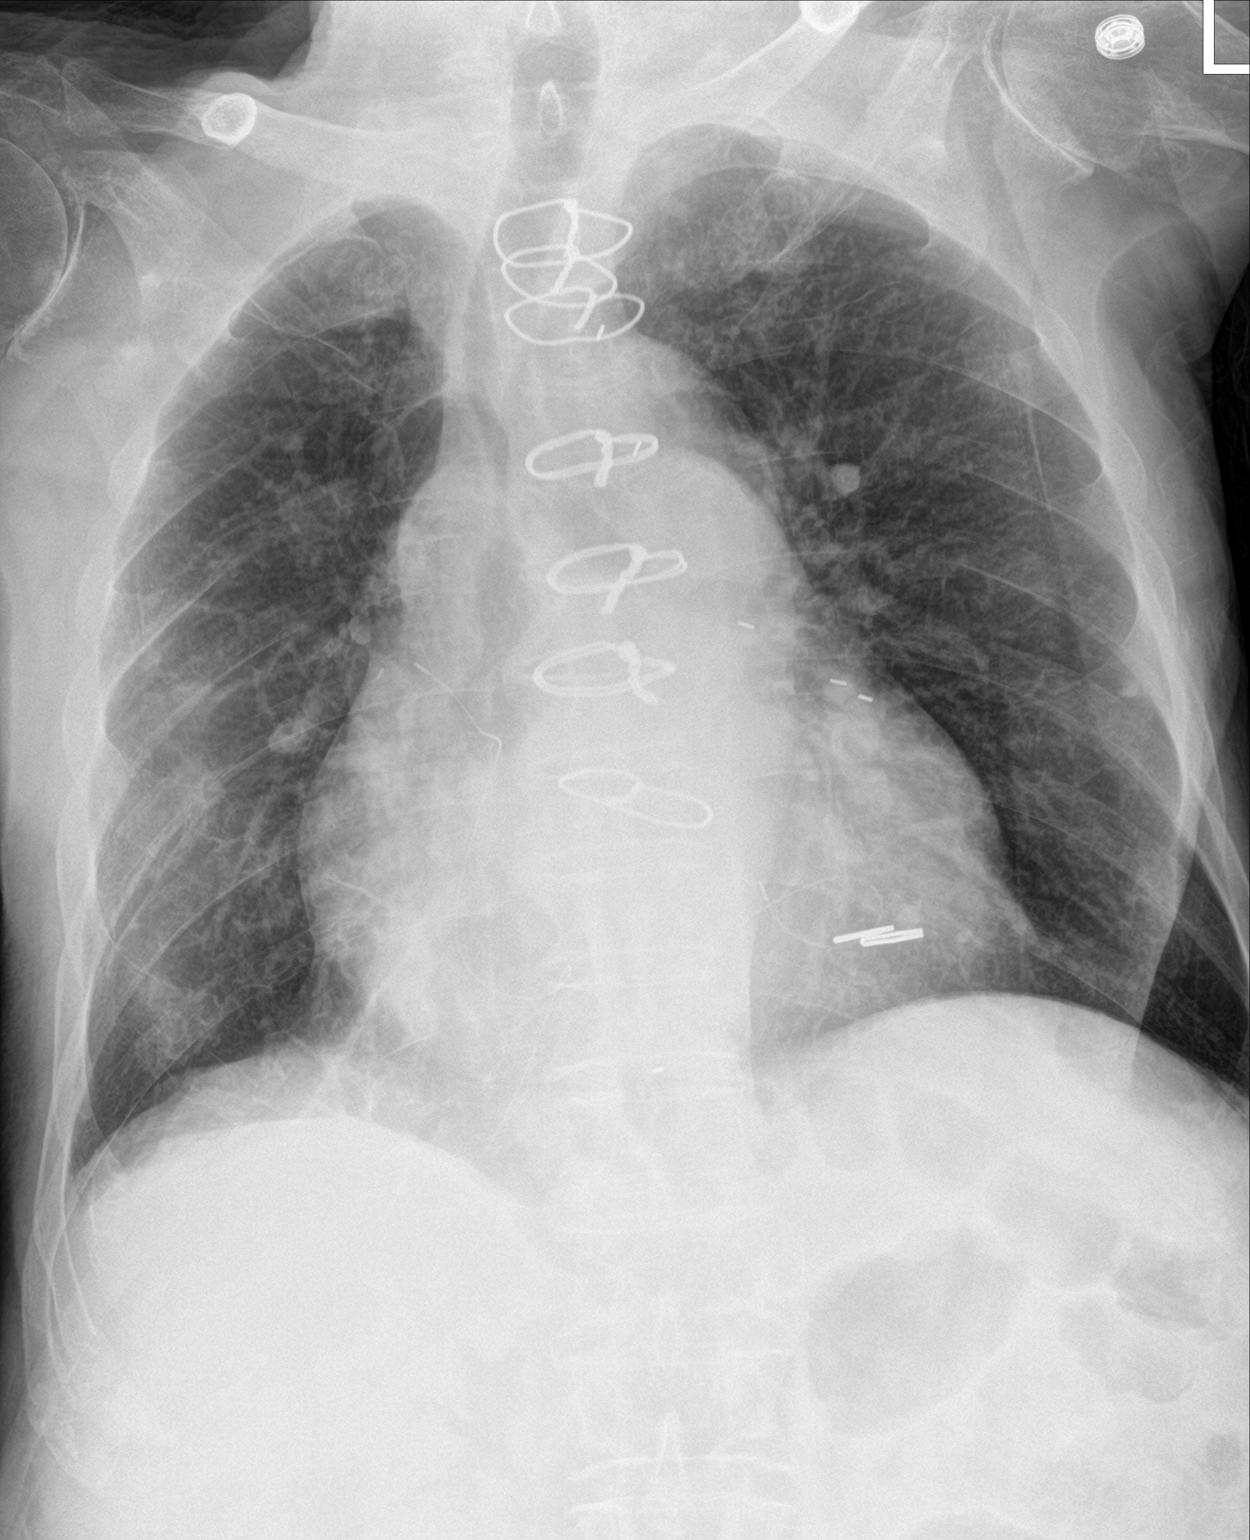

[1 of 1 positions shown; findings below may reference images not displayed]

FINDINGS: Trachea is midline. Heart is enlarged. Devitalized wires overlie the
heart. Mild patchy opacification in the lateral right midlung zone
is new. Calcified granuloma in the left midlung zone. Lungs are
otherwise clear. No pleural fluid.
IMPRESSION: Mild patchy opacification in the lateral right midlung zone may be
due to contusion and/or rib fractures.
